# Patient Record
Sex: Male | Born: 1935
Health system: Southern US, Community
[De-identification: ages and names within clinical notes are randomized; demographics above are authoritative.]

## PROBLEM LIST (undated history)

## (undated) DIAGNOSIS — G4730 Sleep apnea, unspecified: Secondary | ICD-10-CM

## (undated) DIAGNOSIS — I10 Essential (primary) hypertension: Secondary | ICD-10-CM

## (undated) DIAGNOSIS — K5792 Diverticulitis of intestine, part unspecified, without perforation or abscess without bleeding: Secondary | ICD-10-CM

## (undated) DIAGNOSIS — I639 Cerebral infarction, unspecified: Secondary | ICD-10-CM

## (undated) DIAGNOSIS — G479 Sleep disorder, unspecified: Secondary | ICD-10-CM

## (undated) DIAGNOSIS — R059 Cough, unspecified: Secondary | ICD-10-CM

## (undated) DIAGNOSIS — F32A Depression, unspecified: Secondary | ICD-10-CM

## (undated) DIAGNOSIS — M1990 Unspecified osteoarthritis, unspecified site: Secondary | ICD-10-CM

## (undated) DIAGNOSIS — K219 Gastro-esophageal reflux disease without esophagitis: Secondary | ICD-10-CM

## (undated) DIAGNOSIS — R05 Cough: Secondary | ICD-10-CM

## (undated) DIAGNOSIS — I499 Cardiac arrhythmia, unspecified: Secondary | ICD-10-CM

## (undated) DIAGNOSIS — F329 Major depressive disorder, single episode, unspecified: Secondary | ICD-10-CM

## (undated) DIAGNOSIS — K649 Unspecified hemorrhoids: Secondary | ICD-10-CM

## (undated) DIAGNOSIS — E7800 Pure hypercholesterolemia, unspecified: Secondary | ICD-10-CM

## (undated) HISTORY — DX: Depression, unspecified: F32.A

## (undated) HISTORY — DX: Unspecified hemorrhoids: K64.9

## (undated) HISTORY — DX: Unspecified osteoarthritis, unspecified site: M19.90

## (undated) HISTORY — DX: Diverticulitis of intestine, part unspecified, without perforation or abscess without bleeding: K57.92

## (undated) HISTORY — DX: Pure hypercholesterolemia, unspecified: E78.00

## (undated) HISTORY — PX: KNEE ARTHROSCOPY: SHX127

## (undated) HISTORY — DX: Cardiac arrhythmia, unspecified: I49.9

## (undated) HISTORY — DX: Sleep disorder, unspecified: G47.9

## (undated) HISTORY — DX: Cerebral infarction, unspecified: I63.9

## (undated) HISTORY — PX: COLONOSCOPY: SHX174

## (undated) HISTORY — PX: TRANSURETHRAL RESECTION OF PROSTATE: SHX73

## (undated) HISTORY — DX: Essential (primary) hypertension: I10

## (undated) HISTORY — DX: Major depressive disorder, single episode, unspecified: F32.9

---

## 2005-11-05 ENCOUNTER — Ambulatory Visit: Payer: Self-pay | Admitting: Gastroenterology

## 2005-11-11 ENCOUNTER — Inpatient Hospital Stay: Payer: Self-pay | Admitting: Gastroenterology

## 2006-09-27 DIAGNOSIS — I10 Essential (primary) hypertension: Secondary | ICD-10-CM | POA: Insufficient documentation

## 2008-01-28 ENCOUNTER — Ambulatory Visit: Payer: Self-pay | Admitting: Specialist

## 2008-02-12 ENCOUNTER — Ambulatory Visit: Payer: Self-pay | Admitting: Specialist

## 2008-02-19 ENCOUNTER — Ambulatory Visit: Payer: Self-pay | Admitting: Specialist

## 2009-04-19 ENCOUNTER — Ambulatory Visit: Payer: Self-pay | Admitting: Family Medicine

## 2011-04-09 DIAGNOSIS — M5137 Other intervertebral disc degeneration, lumbosacral region: Secondary | ICD-10-CM | POA: Diagnosis not present

## 2011-04-09 DIAGNOSIS — R079 Chest pain, unspecified: Secondary | ICD-10-CM | POA: Diagnosis not present

## 2011-04-09 DIAGNOSIS — M549 Dorsalgia, unspecified: Secondary | ICD-10-CM | POA: Diagnosis not present

## 2011-04-25 DIAGNOSIS — J209 Acute bronchitis, unspecified: Secondary | ICD-10-CM | POA: Diagnosis not present

## 2011-06-08 DIAGNOSIS — N529 Male erectile dysfunction, unspecified: Secondary | ICD-10-CM | POA: Diagnosis not present

## 2011-06-08 DIAGNOSIS — E785 Hyperlipidemia, unspecified: Secondary | ICD-10-CM | POA: Diagnosis not present

## 2011-06-08 DIAGNOSIS — I10 Essential (primary) hypertension: Secondary | ICD-10-CM | POA: Diagnosis not present

## 2011-06-08 DIAGNOSIS — F329 Major depressive disorder, single episode, unspecified: Secondary | ICD-10-CM | POA: Diagnosis not present

## 2011-06-13 DIAGNOSIS — F329 Major depressive disorder, single episode, unspecified: Secondary | ICD-10-CM | POA: Diagnosis not present

## 2011-06-13 DIAGNOSIS — E785 Hyperlipidemia, unspecified: Secondary | ICD-10-CM | POA: Diagnosis not present

## 2011-06-13 DIAGNOSIS — I10 Essential (primary) hypertension: Secondary | ICD-10-CM | POA: Diagnosis not present

## 2011-07-06 DIAGNOSIS — J069 Acute upper respiratory infection, unspecified: Secondary | ICD-10-CM | POA: Diagnosis not present

## 2011-08-10 DIAGNOSIS — M1710 Unilateral primary osteoarthritis, unspecified knee: Secondary | ICD-10-CM | POA: Diagnosis not present

## 2011-08-10 DIAGNOSIS — R6882 Decreased libido: Secondary | ICD-10-CM | POA: Diagnosis not present

## 2011-08-10 DIAGNOSIS — I10 Essential (primary) hypertension: Secondary | ICD-10-CM | POA: Diagnosis not present

## 2011-08-10 DIAGNOSIS — H8110 Benign paroxysmal vertigo, unspecified ear: Secondary | ICD-10-CM | POA: Diagnosis not present

## 2011-09-07 DIAGNOSIS — L255 Unspecified contact dermatitis due to plants, except food: Secondary | ICD-10-CM | POA: Diagnosis not present

## 2011-09-07 DIAGNOSIS — R42 Dizziness and giddiness: Secondary | ICD-10-CM | POA: Diagnosis not present

## 2011-09-07 DIAGNOSIS — F329 Major depressive disorder, single episode, unspecified: Secondary | ICD-10-CM | POA: Diagnosis not present

## 2011-09-07 DIAGNOSIS — I10 Essential (primary) hypertension: Secondary | ICD-10-CM | POA: Diagnosis not present

## 2011-09-13 DIAGNOSIS — M999 Biomechanical lesion, unspecified: Secondary | ICD-10-CM | POA: Diagnosis not present

## 2011-09-13 DIAGNOSIS — M5380 Other specified dorsopathies, site unspecified: Secondary | ICD-10-CM | POA: Diagnosis not present

## 2011-09-13 DIAGNOSIS — M5430 Sciatica, unspecified side: Secondary | ICD-10-CM | POA: Diagnosis not present

## 2011-09-18 DIAGNOSIS — M999 Biomechanical lesion, unspecified: Secondary | ICD-10-CM | POA: Diagnosis not present

## 2011-09-18 DIAGNOSIS — M5430 Sciatica, unspecified side: Secondary | ICD-10-CM | POA: Diagnosis not present

## 2011-09-18 DIAGNOSIS — M5380 Other specified dorsopathies, site unspecified: Secondary | ICD-10-CM | POA: Diagnosis not present

## 2011-09-24 DIAGNOSIS — M5380 Other specified dorsopathies, site unspecified: Secondary | ICD-10-CM | POA: Diagnosis not present

## 2011-09-24 DIAGNOSIS — M5430 Sciatica, unspecified side: Secondary | ICD-10-CM | POA: Diagnosis not present

## 2011-09-24 DIAGNOSIS — M999 Biomechanical lesion, unspecified: Secondary | ICD-10-CM | POA: Diagnosis not present

## 2011-09-27 DIAGNOSIS — M5430 Sciatica, unspecified side: Secondary | ICD-10-CM | POA: Diagnosis not present

## 2011-09-27 DIAGNOSIS — M5380 Other specified dorsopathies, site unspecified: Secondary | ICD-10-CM | POA: Diagnosis not present

## 2011-09-27 DIAGNOSIS — M999 Biomechanical lesion, unspecified: Secondary | ICD-10-CM | POA: Diagnosis not present

## 2011-10-12 DIAGNOSIS — M999 Biomechanical lesion, unspecified: Secondary | ICD-10-CM | POA: Diagnosis not present

## 2011-10-12 DIAGNOSIS — M5380 Other specified dorsopathies, site unspecified: Secondary | ICD-10-CM | POA: Diagnosis not present

## 2011-10-12 DIAGNOSIS — M5430 Sciatica, unspecified side: Secondary | ICD-10-CM | POA: Diagnosis not present

## 2011-10-26 DIAGNOSIS — M5430 Sciatica, unspecified side: Secondary | ICD-10-CM | POA: Diagnosis not present

## 2011-10-26 DIAGNOSIS — M999 Biomechanical lesion, unspecified: Secondary | ICD-10-CM | POA: Diagnosis not present

## 2011-10-26 DIAGNOSIS — M5380 Other specified dorsopathies, site unspecified: Secondary | ICD-10-CM | POA: Diagnosis not present

## 2011-11-12 DIAGNOSIS — M5430 Sciatica, unspecified side: Secondary | ICD-10-CM | POA: Diagnosis not present

## 2011-11-12 DIAGNOSIS — M999 Biomechanical lesion, unspecified: Secondary | ICD-10-CM | POA: Diagnosis not present

## 2011-11-12 DIAGNOSIS — M5380 Other specified dorsopathies, site unspecified: Secondary | ICD-10-CM | POA: Diagnosis not present

## 2011-12-13 DIAGNOSIS — H2510 Age-related nuclear cataract, unspecified eye: Secondary | ICD-10-CM | POA: Diagnosis not present

## 2011-12-31 DIAGNOSIS — Z23 Encounter for immunization: Secondary | ICD-10-CM | POA: Diagnosis not present

## 2012-01-09 DIAGNOSIS — N529 Male erectile dysfunction, unspecified: Secondary | ICD-10-CM | POA: Diagnosis not present

## 2012-01-09 DIAGNOSIS — E7800 Pure hypercholesterolemia, unspecified: Secondary | ICD-10-CM | POA: Diagnosis not present

## 2012-01-09 DIAGNOSIS — R42 Dizziness and giddiness: Secondary | ICD-10-CM | POA: Diagnosis not present

## 2012-01-09 DIAGNOSIS — I10 Essential (primary) hypertension: Secondary | ICD-10-CM | POA: Diagnosis not present

## 2012-01-10 DIAGNOSIS — M5430 Sciatica, unspecified side: Secondary | ICD-10-CM | POA: Diagnosis not present

## 2012-01-10 DIAGNOSIS — M999 Biomechanical lesion, unspecified: Secondary | ICD-10-CM | POA: Diagnosis not present

## 2012-01-10 DIAGNOSIS — M5380 Other specified dorsopathies, site unspecified: Secondary | ICD-10-CM | POA: Diagnosis not present

## 2012-01-11 DIAGNOSIS — I10 Essential (primary) hypertension: Secondary | ICD-10-CM | POA: Diagnosis not present

## 2012-01-11 DIAGNOSIS — E7800 Pure hypercholesterolemia, unspecified: Secondary | ICD-10-CM | POA: Diagnosis not present

## 2012-05-28 DIAGNOSIS — I10 Essential (primary) hypertension: Secondary | ICD-10-CM | POA: Diagnosis not present

## 2012-05-28 DIAGNOSIS — E7800 Pure hypercholesterolemia, unspecified: Secondary | ICD-10-CM | POA: Diagnosis not present

## 2012-06-27 DIAGNOSIS — I10 Essential (primary) hypertension: Secondary | ICD-10-CM | POA: Diagnosis not present

## 2012-06-27 DIAGNOSIS — R42 Dizziness and giddiness: Secondary | ICD-10-CM | POA: Diagnosis not present

## 2012-06-27 DIAGNOSIS — E7800 Pure hypercholesterolemia, unspecified: Secondary | ICD-10-CM | POA: Diagnosis not present

## 2012-06-30 DIAGNOSIS — I10 Essential (primary) hypertension: Secondary | ICD-10-CM | POA: Diagnosis not present

## 2012-06-30 DIAGNOSIS — E7800 Pure hypercholesterolemia, unspecified: Secondary | ICD-10-CM | POA: Diagnosis not present

## 2012-07-07 DIAGNOSIS — J209 Acute bronchitis, unspecified: Secondary | ICD-10-CM | POA: Diagnosis not present

## 2012-09-09 DIAGNOSIS — R42 Dizziness and giddiness: Secondary | ICD-10-CM | POA: Diagnosis not present

## 2012-10-31 DIAGNOSIS — R1013 Epigastric pain: Secondary | ICD-10-CM | POA: Diagnosis not present

## 2012-11-04 DIAGNOSIS — R5383 Other fatigue: Secondary | ICD-10-CM | POA: Diagnosis not present

## 2012-11-04 DIAGNOSIS — I10 Essential (primary) hypertension: Secondary | ICD-10-CM | POA: Diagnosis not present

## 2012-11-04 DIAGNOSIS — H6120 Impacted cerumen, unspecified ear: Secondary | ICD-10-CM | POA: Diagnosis not present

## 2012-11-04 DIAGNOSIS — E7800 Pure hypercholesterolemia, unspecified: Secondary | ICD-10-CM | POA: Diagnosis not present

## 2012-11-05 DIAGNOSIS — E7800 Pure hypercholesterolemia, unspecified: Secondary | ICD-10-CM | POA: Diagnosis not present

## 2012-11-05 DIAGNOSIS — R5381 Other malaise: Secondary | ICD-10-CM | POA: Diagnosis not present

## 2012-11-13 DIAGNOSIS — Z1331 Encounter for screening for depression: Secondary | ICD-10-CM | POA: Diagnosis not present

## 2012-11-13 DIAGNOSIS — Z0000 Encounter for general adult medical examination without abnormal findings: Secondary | ICD-10-CM | POA: Diagnosis not present

## 2012-11-13 DIAGNOSIS — Z1211 Encounter for screening for malignant neoplasm of colon: Secondary | ICD-10-CM | POA: Diagnosis not present

## 2012-11-13 DIAGNOSIS — Z125 Encounter for screening for malignant neoplasm of prostate: Secondary | ICD-10-CM | POA: Diagnosis not present

## 2013-03-17 DIAGNOSIS — M955 Acquired deformity of pelvis: Secondary | ICD-10-CM | POA: Diagnosis not present

## 2013-03-17 DIAGNOSIS — M999 Biomechanical lesion, unspecified: Secondary | ICD-10-CM | POA: Diagnosis not present

## 2013-03-17 DIAGNOSIS — IMO0002 Reserved for concepts with insufficient information to code with codable children: Secondary | ICD-10-CM | POA: Diagnosis not present

## 2013-03-18 DIAGNOSIS — F329 Major depressive disorder, single episode, unspecified: Secondary | ICD-10-CM | POA: Diagnosis not present

## 2013-03-18 DIAGNOSIS — I10 Essential (primary) hypertension: Secondary | ICD-10-CM | POA: Diagnosis not present

## 2013-03-18 DIAGNOSIS — N529 Male erectile dysfunction, unspecified: Secondary | ICD-10-CM | POA: Diagnosis not present

## 2013-03-18 DIAGNOSIS — E7800 Pure hypercholesterolemia, unspecified: Secondary | ICD-10-CM | POA: Diagnosis not present

## 2013-03-30 DIAGNOSIS — IMO0002 Reserved for concepts with insufficient information to code with codable children: Secondary | ICD-10-CM | POA: Diagnosis not present

## 2013-03-30 DIAGNOSIS — M955 Acquired deformity of pelvis: Secondary | ICD-10-CM | POA: Diagnosis not present

## 2013-03-30 DIAGNOSIS — M999 Biomechanical lesion, unspecified: Secondary | ICD-10-CM | POA: Diagnosis not present

## 2013-04-03 DIAGNOSIS — J00 Acute nasopharyngitis [common cold]: Secondary | ICD-10-CM | POA: Diagnosis not present

## 2013-06-23 DIAGNOSIS — H2510 Age-related nuclear cataract, unspecified eye: Secondary | ICD-10-CM | POA: Diagnosis not present

## 2013-07-22 DIAGNOSIS — E8881 Metabolic syndrome: Secondary | ICD-10-CM | POA: Diagnosis not present

## 2013-07-22 DIAGNOSIS — E7800 Pure hypercholesterolemia, unspecified: Secondary | ICD-10-CM | POA: Diagnosis not present

## 2013-07-22 DIAGNOSIS — I10 Essential (primary) hypertension: Secondary | ICD-10-CM | POA: Diagnosis not present

## 2013-07-22 DIAGNOSIS — R7309 Other abnormal glucose: Secondary | ICD-10-CM | POA: Diagnosis not present

## 2013-08-12 DIAGNOSIS — E7800 Pure hypercholesterolemia, unspecified: Secondary | ICD-10-CM | POA: Diagnosis not present

## 2013-08-12 DIAGNOSIS — I10 Essential (primary) hypertension: Secondary | ICD-10-CM | POA: Diagnosis not present

## 2013-10-29 DIAGNOSIS — H2510 Age-related nuclear cataract, unspecified eye: Secondary | ICD-10-CM | POA: Diagnosis not present

## 2013-12-09 DIAGNOSIS — F3289 Other specified depressive episodes: Secondary | ICD-10-CM | POA: Diagnosis not present

## 2013-12-09 DIAGNOSIS — B351 Tinea unguium: Secondary | ICD-10-CM | POA: Diagnosis not present

## 2013-12-09 DIAGNOSIS — I10 Essential (primary) hypertension: Secondary | ICD-10-CM | POA: Diagnosis not present

## 2013-12-09 DIAGNOSIS — F329 Major depressive disorder, single episode, unspecified: Secondary | ICD-10-CM | POA: Diagnosis not present

## 2013-12-09 DIAGNOSIS — Z23 Encounter for immunization: Secondary | ICD-10-CM | POA: Diagnosis not present

## 2014-01-25 DIAGNOSIS — Z1389 Encounter for screening for other disorder: Secondary | ICD-10-CM | POA: Diagnosis not present

## 2014-01-25 DIAGNOSIS — Z1211 Encounter for screening for malignant neoplasm of colon: Secondary | ICD-10-CM | POA: Diagnosis not present

## 2014-01-25 DIAGNOSIS — Z23 Encounter for immunization: Secondary | ICD-10-CM | POA: Diagnosis not present

## 2014-01-25 DIAGNOSIS — Z9181 History of falling: Secondary | ICD-10-CM | POA: Diagnosis not present

## 2014-01-25 DIAGNOSIS — Z125 Encounter for screening for malignant neoplasm of prostate: Secondary | ICD-10-CM | POA: Diagnosis not present

## 2014-01-25 DIAGNOSIS — Z0000 Encounter for general adult medical examination without abnormal findings: Secondary | ICD-10-CM | POA: Diagnosis not present

## 2014-02-23 DIAGNOSIS — M9905 Segmental and somatic dysfunction of pelvic region: Secondary | ICD-10-CM | POA: Diagnosis not present

## 2014-02-23 DIAGNOSIS — M5136 Other intervertebral disc degeneration, lumbar region: Secondary | ICD-10-CM | POA: Diagnosis not present

## 2014-02-23 DIAGNOSIS — M955 Acquired deformity of pelvis: Secondary | ICD-10-CM | POA: Diagnosis not present

## 2014-02-23 DIAGNOSIS — M9903 Segmental and somatic dysfunction of lumbar region: Secondary | ICD-10-CM | POA: Diagnosis not present

## 2014-03-17 DIAGNOSIS — F329 Major depressive disorder, single episode, unspecified: Secondary | ICD-10-CM | POA: Diagnosis not present

## 2014-03-17 DIAGNOSIS — R001 Bradycardia, unspecified: Secondary | ICD-10-CM | POA: Diagnosis not present

## 2014-03-17 DIAGNOSIS — K5900 Constipation, unspecified: Secondary | ICD-10-CM | POA: Diagnosis not present

## 2014-03-17 DIAGNOSIS — I10 Essential (primary) hypertension: Secondary | ICD-10-CM | POA: Diagnosis not present

## 2014-03-17 DIAGNOSIS — E780 Pure hypercholesterolemia: Secondary | ICD-10-CM | POA: Diagnosis not present

## 2014-03-17 DIAGNOSIS — Z0000 Encounter for general adult medical examination without abnormal findings: Secondary | ICD-10-CM | POA: Diagnosis not present

## 2014-03-17 DIAGNOSIS — G479 Sleep disorder, unspecified: Secondary | ICD-10-CM | POA: Diagnosis not present

## 2014-03-17 LAB — BASIC METABOLIC PANEL: Glucose: 91 mg/dL

## 2014-04-05 ENCOUNTER — Encounter (INDEPENDENT_AMBULATORY_CARE_PROVIDER_SITE_OTHER): Payer: Self-pay

## 2014-04-05 ENCOUNTER — Encounter: Payer: Self-pay | Admitting: *Deleted

## 2014-04-05 ENCOUNTER — Ambulatory Visit (INDEPENDENT_AMBULATORY_CARE_PROVIDER_SITE_OTHER): Payer: Commercial Managed Care - HMO | Admitting: Cardiovascular Disease

## 2014-04-05 ENCOUNTER — Encounter: Payer: Self-pay | Admitting: Cardiovascular Disease

## 2014-04-05 VITALS — BP 126/76 | HR 81 | Ht 71.0 in | Wt 255.2 lb

## 2014-04-05 DIAGNOSIS — R001 Bradycardia, unspecified: Secondary | ICD-10-CM | POA: Diagnosis not present

## 2014-04-05 DIAGNOSIS — I10 Essential (primary) hypertension: Secondary | ICD-10-CM | POA: Diagnosis not present

## 2014-04-05 NOTE — Patient Instructions (Signed)
Your physician has recommended that you wear a holter monitor. Holter monitors are medical devices that record the heart's electrical activity. Doctors most often use these monitors to diagnose arrhythmias. Arrhythmias are problems with the speed or rhythm of the heartbeat. The monitor is a small, portable device. You can wear one while you do your normal daily activities. This is usually used to diagnose what is causing palpitations/syncope (passing out).  Your physician wants you to follow-up in: 6 months. You will receive a reminder letter in the mail two months in advance. If you don't receive a letter, please call our office to schedule the follow-up appointment.  

## 2014-04-05 NOTE — Progress Notes (Signed)
Primary care physician: Dr. Thana Ates  HPI  This is a pleasant 81 year old male who was referred for evaluation of bradycardia and PVCs. He has no previous cardiac history. He has known history of hypertension, GERD, hyperlipidemia and obesity. He reports recent symptoms of vertigo which improved without intervention. He currently denies dizziness, presyncope or syncope. He was noted recently to be bradycardic with a heart rate of 51 bpm with frequent PVCs noted on EKG. He had labs done which were unremarkable including normal thyroid function. He denies chest pain or shortness of breath. He is very active and plays golf twice a week. He also does his own yard work without significant limitations. He is not a smoker and denies any alcohol use. There is no family history of premature coronary artery disease or arrhythmia.  No Known Allergies   No current outpatient prescriptions on file prior to visit.   No current facility-administered medications on file prior to visit.     Past Medical History  Diagnosis Date  . Diverticulitis   . Hypertension   . Pure hypercholesterolemia   . Hemorrhoids   . OA (osteoarthritis)   . Disturbance of sleep      Past Surgical History  Procedure Laterality Date  . Transurethral resection of prostate    . Knee arthroscopy    . Colonoscopy       Family History  Problem Relation Age of Onset  . Family history unknown: Yes     History   Social History  . Marital Status: Divorced    Spouse Name: N/A    Number of Children: N/A  . Years of Education: N/A   Occupational History  . Not on file.   Social History Main Topics  . Smoking status: Never Smoker   . Smokeless tobacco: Not on file  . Alcohol Use: No  . Drug Use: No  . Sexual Activity: Not on file   Other Topics Concern  . Not on file   Social History Narrative     ROS A 10 point review of system was performed. It is negative other than that mentioned in the history of  present illness.   PHYSICAL EXAM   BP 126/76 mmHg  Pulse 81  Ht  (1.803 m)  Wt 255 lb 4 oz (115.781 kg)  BMI 35.62 kg/m2 Constitutional: He is oriented to person, place, and time. He appears well-developed and well-nourished. No distress.  HENT: No nasal discharge.  Head: Normocephalic and atraumatic.  Eyes: Pupils are equal and round.  No discharge. Neck: Normal range of motion. Neck supple. No JVD present. No thyromegaly present.  Cardiovascular: Normal rate, regular rhythm, normal heart sounds. Exam reveals no gallop and no friction rub. No murmur heard.  Pulmonary/Chest: Effort normal and breath sounds normal. No stridor. No respiratory distress. He has no wheezes. He has no rales. He exhibits no tenderness.  Abdominal: Soft. Bowel sounds are normal. He exhibits no distension. There is no tenderness. There is no rebound and no guarding.  Musculoskeletal: Normal range of motion. He exhibits no edema and no tenderness.  Neurological: He is alert and oriented to person, place, and time. Coordination normal.  Skin: Skin is warm and dry. No rash noted. He is not diaphoretic. No erythema. No pallor.  Psychiatric: He has a normal mood and affect. His behavior is normal. Judgment and thought content normal.       ZOX:[id number]  Rhythm  -Left axis -anterior fascicular block.   ABNORMAL  ASSESSMENT AND PLAN

## 2014-04-05 NOTE — Assessment & Plan Note (Signed)
Blood pressure is well controlled on current medications. 

## 2014-04-05 NOTE — Assessment & Plan Note (Signed)
The patient had recent bradycardia with a heart rate of 51 bpm while awake. He reports that he had vertigo and not dizziness, syncope or presyncope. It is difficult to determine if he was symptomatic from a cardiac standpoint. I recommend a 48 hour Holter monitor for evaluation. He was also noted to have PVCs but current EKG seems unremarkable other than left anterior fascicular block. He has no symptoms suggestive of angina and he seems to be very active.

## 2014-04-15 DIAGNOSIS — R001 Bradycardia, unspecified: Secondary | ICD-10-CM

## 2014-05-05 ENCOUNTER — Telehealth: Payer: Self-pay | Admitting: *Deleted

## 2014-05-05 ENCOUNTER — Ambulatory Visit (INDEPENDENT_AMBULATORY_CARE_PROVIDER_SITE_OTHER): Payer: Medicare PPO

## 2014-05-05 ENCOUNTER — Other Ambulatory Visit: Payer: Self-pay

## 2014-05-05 DIAGNOSIS — R001 Bradycardia, unspecified: Secondary | ICD-10-CM

## 2014-05-05 DIAGNOSIS — I493 Ventricular premature depolarization: Secondary | ICD-10-CM

## 2014-05-05 NOTE — Telephone Encounter (Signed)
Informed patient per Dr. Kirke CorinArida his holter showed NSR with very frequent PVC's  Schedule echo and f/u   Patient verbalized understanding

## 2014-05-14 ENCOUNTER — Other Ambulatory Visit: Payer: Medicare PPO

## 2014-05-18 ENCOUNTER — Other Ambulatory Visit: Payer: Self-pay

## 2014-05-18 ENCOUNTER — Other Ambulatory Visit (INDEPENDENT_AMBULATORY_CARE_PROVIDER_SITE_OTHER): Payer: Commercial Managed Care - HMO

## 2014-05-18 DIAGNOSIS — R9431 Abnormal electrocardiogram [ECG] [EKG]: Secondary | ICD-10-CM

## 2014-05-18 DIAGNOSIS — R001 Bradycardia, unspecified: Secondary | ICD-10-CM

## 2014-05-18 DIAGNOSIS — I493 Ventricular premature depolarization: Secondary | ICD-10-CM

## 2014-05-24 ENCOUNTER — Encounter: Payer: Self-pay | Admitting: Cardiovascular Disease

## 2014-05-24 ENCOUNTER — Ambulatory Visit (INDEPENDENT_AMBULATORY_CARE_PROVIDER_SITE_OTHER): Payer: Commercial Managed Care - HMO | Admitting: Cardiovascular Disease

## 2014-05-24 VITALS — BP 148/80 | HR 70 | Ht 73.0 in | Wt 254.2 lb

## 2014-05-24 DIAGNOSIS — I493 Ventricular premature depolarization: Secondary | ICD-10-CM

## 2014-05-24 DIAGNOSIS — I10 Essential (primary) hypertension: Secondary | ICD-10-CM

## 2014-05-24 MED ORDER — DILTIAZEM HCL ER COATED BEADS 120 MG PO CP24: 120.0000 mg | ORAL_CAPSULE | Freq: Every day | ORAL | Status: DC

## 2014-05-24 NOTE — Patient Instructions (Signed)
Your physician has recommended you make the following change in your medication:  Stop Lisinopril  Start Diltiazem ER 120 mg once daily   Your physician wants you to follow-up in: 6 months. You will receive a reminder letter in the mail two months in advance. If you don't receive a letter, please call our office to schedule the follow-up appointment.

## 2014-05-24 NOTE — Progress Notes (Signed)
Primary care physician: Dr. Thana AtesMorrisey  HPI  This is a pleasant 81 year old male who is here today for a follow-up visit regarding  bradycardia and PVCs. He has no previous cardiac history. He has known history of hypertension, GERD, hyperlipidemia and obesity. He reports recent symptoms of vertigo which improved without intervention. He currently denies dizziness, presyncope or syncope. He was noted recently to be bradycardic with a heart rate of 51 bpm with frequent PVCs noted on EKG. He had labs done which were unremarkable including normal thyroid function. He denies chest pain or shortness of breath. He is very active and plays golf twice a week. He also does his own yard work without significant limitations. Holter monitor showed sinus rhythm with very frequent PVCs. He had a total of 29,000 beats constituting 18%. Echocardiogram showed normal LV systolic function with mildly dilated aortic root and mild mitral regurgitation due to prolapse of the posterior leaflet.  No Known Allergies   Current Outpatient Prescriptions on File Prior to Visit  Medication Sig Dispense Refill  . aspirin 81 MG tablet Take 81 mg by mouth daily.    . citalopram (CELEXA) 10 MG tablet Take 10 mg by mouth daily.    . cyclobenzaprine (FLEXERIL) 5 MG tablet Take 5 mg by mouth at bedtime.    . diazepam (VALIUM) 5 MG tablet Take 5 mg by mouth every 8 (eight) hours as needed for anxiety.    . lansoprazole (PREVACID) 30 MG capsule Take 30 mg by mouth daily at 12 noon.    [name]. lisinopril (PRINIVIL,ZESTRIL) 10 MG tablet Take 10 mg by mouth daily.    . meloxicam (MOBIC) 7.5 MG tablet Take 7.5 mg by mouth 2 (two) times daily.    . Omega 3 1200 MG CAPS Take by mouth daily.    . sildenafil (VIAGRA) 100 MG tablet Take 100 mg by mouth daily as needed for erectile dysfunction.    [name]. terazosin (HYTRIN) 5 MG capsule Take 5 mg by mouth at bedtime.    . vitamin B-12 (CYANOCOBALAMIN) 1000 MCG tablet Take 2,000 mcg by mouth daily.       No current facility-administered medications on file prior to visit.     Past Medical History  Diagnosis Date  . Diverticulitis   . Hypertension   . Pure hypercholesterolemia   . Hemorrhoids   . OA (osteoarthritis)   . Disturbance of sleep      Past Surgical History  Procedure Laterality Date  . Transurethral resection of prostate    . Knee arthroscopy    . Colonoscopy       Family History  Problem Relation Age of Onset  . Family history unknown: Yes     History   Social History  . Marital Status: Divorced    Spouse Name: N/A  . Number of Children: N/A  . Years of Education: N/A   Occupational History  . Not on file.   Social History Main Topics  . Smoking status: Never Smoker   . Smokeless tobacco: Not on file  . Alcohol Use: No  . Drug Use: No  . Sexual Activity: Not on file   Other Topics Concern  . Not on file   Social History Narrative     ROS A 10 point review of system was performed. It is negative other than that mentioned in the history of present illness.   PHYSICAL EXAM   BP 148/80 mmHg  Pulse 70  Ht 6\' 1"  (1.854 m)  Wt 254  lb 4 oz (115.327 kg)  BMI 33.55 kg/m2 Constitutional: He is oriented to person, place, and time. He appears well-developed and well-nourished. No distress.  HENT: No nasal discharge.  Head: Normocephalic and atraumatic.  Eyes: Pupils are equal and round.  No discharge. Neck: Normal range of motion. Neck supple. No JVD present. No thyromegaly present.  Cardiovascular: Normal rate, regular rhythm, normal heart sounds. Exam reveals no gallop and no friction rub. No murmur heard.  Pulmonary/Chest: Effort normal and breath sounds normal. No stridor. No respiratory distress. He has no wheezes. He has no rales. He exhibits no tenderness.  Abdominal: Soft. Bowel sounds are normal. He exhibits no distension. There is no tenderness. There is no rebound and no guarding.  Musculoskeletal: Normal range of motion. He  exhibits no edema and no tenderness.  Neurological: He is alert and oriented to person, place, and time. Coordination normal.  Skin: Skin is warm and dry. No rash noted. He is not diaphoretic. No erythema. No pallor.  Psychiatric: He has a normal mood and affect. His behavior is normal. Judgment and thought content normal.       WUJ:[id number]  Rhythm  -Left axis -anterior fascicular block.   ABNORMAL    ASSESSMENT AND PLAN

## 2014-05-27 DIAGNOSIS — I493 Ventricular premature depolarization: Secondary | ICD-10-CM | POA: Insufficient documentation

## 2014-05-27 NOTE — Assessment & Plan Note (Signed)
The patient has frequent PVCs. Although he is asymptomatic, the burden of PVCs is significant enough to warrant treatment. LV systolic function was normal by echo. I started treatment with diltiazem extended release 120 mg once daily.

## 2014-05-27 NOTE — Assessment & Plan Note (Signed)
I discontinued lisinopril given that I am starting diltiazem.

## 2014-08-19 ENCOUNTER — Emergency Department (HOSPITAL_COMMUNITY): Payer: Commercial Managed Care - HMO

## 2014-08-19 ENCOUNTER — Inpatient Hospital Stay (HOSPITAL_COMMUNITY): Payer: Commercial Managed Care - HMO

## 2014-08-19 ENCOUNTER — Encounter (HOSPITAL_COMMUNITY): Payer: Self-pay

## 2014-08-19 ENCOUNTER — Inpatient Hospital Stay (HOSPITAL_COMMUNITY)
Admission: EM | Admit: 2014-08-19 | Discharge: 2014-08-23 | DRG: 065 | Payer: Commercial Managed Care - HMO | Attending: Internal Medicine | Admitting: Internal Medicine

## 2014-08-19 DIAGNOSIS — I639 Cerebral infarction, unspecified: Secondary | ICD-10-CM | POA: Diagnosis present

## 2014-08-19 DIAGNOSIS — I63031 Cerebral infarction due to thrombosis of right carotid artery: Secondary | ICD-10-CM | POA: Diagnosis not present

## 2014-08-19 DIAGNOSIS — N179 Acute kidney failure, unspecified: Secondary | ICD-10-CM | POA: Diagnosis not present

## 2014-08-19 DIAGNOSIS — Z7982 Long term (current) use of aspirin: Secondary | ICD-10-CM | POA: Diagnosis not present

## 2014-08-19 DIAGNOSIS — I129 Hypertensive chronic kidney disease with stage 1 through stage 4 chronic kidney disease, or unspecified chronic kidney disease: Secondary | ICD-10-CM | POA: Diagnosis present

## 2014-08-19 DIAGNOSIS — E780 Pure hypercholesterolemia: Secondary | ICD-10-CM | POA: Diagnosis present

## 2014-08-19 DIAGNOSIS — I6789 Other cerebrovascular disease: Secondary | ICD-10-CM | POA: Diagnosis not present

## 2014-08-19 DIAGNOSIS — N183 Chronic kidney disease, stage 3 unspecified: Secondary | ICD-10-CM | POA: Diagnosis present

## 2014-08-19 DIAGNOSIS — Y9301 Activity, walking, marching and hiking: Secondary | ICD-10-CM

## 2014-08-19 DIAGNOSIS — E6609 Other obesity due to excess calories: Secondary | ICD-10-CM | POA: Insufficient documentation

## 2014-08-19 DIAGNOSIS — G464 Cerebellar stroke syndrome: Secondary | ICD-10-CM

## 2014-08-19 DIAGNOSIS — M1990 Unspecified osteoarthritis, unspecified site: Secondary | ICD-10-CM | POA: Diagnosis present

## 2014-08-19 DIAGNOSIS — E785 Hyperlipidemia, unspecified: Secondary | ICD-10-CM | POA: Diagnosis present

## 2014-08-19 DIAGNOSIS — I10 Essential (primary) hypertension: Secondary | ICD-10-CM | POA: Diagnosis not present

## 2014-08-19 DIAGNOSIS — R4781 Slurred speech: Secondary | ICD-10-CM | POA: Diagnosis present

## 2014-08-19 DIAGNOSIS — W1830XA Fall on same level, unspecified, initial encounter: Secondary | ICD-10-CM | POA: Diagnosis present

## 2014-08-19 DIAGNOSIS — I739 Peripheral vascular disease, unspecified: Secondary | ICD-10-CM | POA: Diagnosis present

## 2014-08-19 DIAGNOSIS — G8194 Hemiplegia, unspecified affecting left nondominant side: Secondary | ICD-10-CM | POA: Diagnosis present

## 2014-08-19 DIAGNOSIS — Z6833 Body mass index (BMI) 33.0-33.9, adult: Secondary | ICD-10-CM | POA: Diagnosis not present

## 2014-08-19 DIAGNOSIS — K219 Gastro-esophageal reflux disease without esophagitis: Secondary | ICD-10-CM | POA: Diagnosis not present

## 2014-08-19 DIAGNOSIS — R531 Weakness: Secondary | ICD-10-CM | POA: Diagnosis not present

## 2014-08-19 DIAGNOSIS — G8190 Hemiplegia, unspecified affecting unspecified side: Secondary | ICD-10-CM | POA: Diagnosis not present

## 2014-08-19 DIAGNOSIS — E669 Obesity, unspecified: Secondary | ICD-10-CM | POA: Diagnosis present

## 2014-08-19 DIAGNOSIS — I69354 Hemiplegia and hemiparesis following cerebral infarction affecting left non-dominant side: Secondary | ICD-10-CM | POA: Diagnosis not present

## 2014-08-19 DIAGNOSIS — Z6830 Body mass index (BMI) 30.0-30.9, adult: Secondary | ICD-10-CM | POA: Insufficient documentation

## 2014-08-19 LAB — CBC
HCT: 47.6 % (ref 39.0–52.0)
Hemoglobin: 16.2 g/dL (ref 13.0–17.0)
MCH: 30.7 pg (ref 26.0–34.0)
MCHC: 34 g/dL (ref 30.0–36.0)
MCV: 90.2 fL (ref 78.0–100.0)
PLATELETS: 171 10*3/uL (ref 150–400)
RBC: 5.28 MIL/uL (ref 4.22–5.81)
RDW: 12.5 % (ref 11.5–15.5)
WBC: 6.1 10*3/uL (ref 4.0–10.5)

## 2014-08-19 LAB — COMPREHENSIVE METABOLIC PANEL
ALT: 17 U/L (ref 17–63)
ALT: 17 U/L (ref 17–63)
ANION GAP: 9 (ref 5–15)
AST: 20 U/L (ref 15–41)
AST: 18 U/L (ref 15–41)
Albumin: 3.7 g/dL (ref 3.5–5.0)
Albumin: 3.5 g/dL (ref 3.5–5.0)
Alkaline Phosphatase: 51 U/L (ref 38–126)
Alkaline Phosphatase: 47 U/L (ref 38–126)
Anion gap: 10 (ref 5–15)
BILIRUBIN TOTAL: 1.3 mg/dL — AB (ref 0.3–1.2)
BUN: 16 mg/dL (ref 6–20)
BUN: 13 mg/dL (ref 6–20)
CHLORIDE: 108 mmol/L (ref 101–111)
CO2: 21 mmol/L — AB (ref 22–32)
CO2: 21 mmol/L — ABNORMAL LOW (ref 22–32)
Calcium: 8.5 mg/dL — ABNORMAL LOW (ref 8.9–10.3)
Calcium: 8.6 mg/dL — ABNORMAL LOW (ref 8.9–10.3)
Chloride: 108 mmol/L (ref 101–111)
Creatinine, Ser: 1.3 mg/dL — ABNORMAL HIGH (ref 0.61–1.24)
Creatinine, Ser: 1.05 mg/dL (ref 0.61–1.24)
GFR calc Af Amer: 57 mL/min — ABNORMAL LOW (ref >60)
GFR calc Af Amer: >60 mL/min (ref >60)
GFR calc non Af Amer: >60 mL/min (ref >60)
GFR, EST NON AFRICAN AMERICAN: 49 mL/min — AB (ref >60)
Glucose, Bld: 136 mg/dL — ABNORMAL HIGH (ref 65–99)
Glucose, Bld: 127 mg/dL — ABNORMAL HIGH (ref 65–99)
POTASSIUM: 3.5 mmol/L (ref 3.5–5.1)
POTASSIUM: 3.8 mmol/L (ref 3.5–5.1)
SODIUM: 139 mmol/L (ref 135–145)
SODIUM: 138 mmol/L (ref 135–145)
TOTAL PROTEIN: 6.3 g/dL — AB (ref 6.5–8.1)
TOTAL PROTEIN: 6.1 g/dL — AB (ref 6.5–8.1)
Total Bilirubin: 1 mg/dL (ref 0.3–1.2)

## 2014-08-19 LAB — PROTIME-INR
INR: 1.05 (ref 0.00–1.49)
Prothrombin Time: 13.8 seconds (ref 11.6–15.2)

## 2014-08-19 LAB — URINALYSIS, ROUTINE W REFLEX MICROSCOPIC
Bilirubin Urine: NEGATIVE
Glucose, UA: NEGATIVE mg/dL
Ketones, ur: NEGATIVE mg/dL
LEUKOCYTES UA: NEGATIVE
NITRITE: NEGATIVE
Protein, ur: 100 mg/dL — AB
Specific Gravity, Urine: 1.015 (ref 1.005–1.030)
Urobilinogen, UA: 1 mg/dL (ref 0.0–1.0)
pH: 7 (ref 5.0–8.0)

## 2014-08-19 LAB — RAPID URINE DRUG SCREEN, HOSP PERFORMED
Amphetamines: NOT DETECTED
Barbiturates: NOT DETECTED
Benzodiazepines: NOT DETECTED
Cocaine: NOT DETECTED
OPIATES: NOT DETECTED
Tetrahydrocannabinol: NOT DETECTED

## 2014-08-19 LAB — CBC WITH DIFFERENTIAL/PLATELET
Basophils Absolute: 0 10*3/uL (ref 0.0–0.1)
Basophils Relative: 0 % (ref 0–1)
EOS ABS: 0 10*3/uL (ref 0.0–0.7)
Eosinophils Relative: 0 % (ref 0–5)
HEMATOCRIT: 48.1 % (ref 39.0–52.0)
HEMOGLOBIN: 16.8 g/dL (ref 13.0–17.0)
LYMPHS ABS: 0.9 10*3/uL (ref 0.7–4.0)
Lymphocytes Relative: 10 % — ABNORMAL LOW (ref 12–46)
MCH: 31.1 pg (ref 26.0–34.0)
MCHC: 34.9 g/dL (ref 30.0–36.0)
MCV: 88.9 fL (ref 78.0–100.0)
MONOS PCT: 6 % (ref 3–12)
Monocytes Absolute: 0.6 10*3/uL (ref 0.1–1.0)
Neutro Abs: 7.5 10*3/uL (ref 1.7–7.7)
Neutrophils Relative %: 84 % — ABNORMAL HIGH (ref 43–77)
Platelets: 180 10*3/uL (ref 150–400)
RBC: 5.41 MIL/uL (ref 4.22–5.81)
RDW: 12.5 % (ref 11.5–15.5)
WBC: 9.1 10*3/uL (ref 4.0–10.5)

## 2014-08-19 LAB — LIPID PANEL
CHOL/HDL RATIO: 2.7 ratio
Cholesterol: 130 mg/dL (ref 0–200)
HDL: 48 mg/dL (ref >40)
LDL CALC: 74 mg/dL (ref 0–99)
TRIGLYCERIDES: 40 mg/dL (ref <150)
VLDL: 8 mg/dL (ref 0–40)

## 2014-08-19 LAB — TSH: TSH: 2.052 u[IU]/mL (ref 0.350–4.500)

## 2014-08-19 LAB — DIFFERENTIAL
BASOS PCT: 0 % (ref 0–1)
Basophils Absolute: 0 10*3/uL (ref 0.0–0.1)
Eosinophils Absolute: 0.1 10*3/uL (ref 0.0–0.7)
Eosinophils Relative: 2 % (ref 0–5)
LYMPHS ABS: 1.4 10*3/uL (ref 0.7–4.0)
Lymphocytes Relative: 22 % (ref 12–46)
MONO ABS: 0.6 10*3/uL (ref 0.1–1.0)
MONOS PCT: 10 % (ref 3–12)
NEUTROS ABS: 4 10*3/uL (ref 1.7–7.7)
NEUTROS PCT: 66 % (ref 43–77)

## 2014-08-19 LAB — I-STAT CHEM 8, ED
BUN: 20 mg/dL (ref 6–20)
CREATININE: 1.2 mg/dL (ref 0.61–1.24)
Calcium, Ion: 1.1 mmol/L — ABNORMAL LOW (ref 1.13–1.30)
Chloride: 106 mmol/L (ref 101–111)
GLUCOSE: 138 mg/dL — AB (ref 65–99)
HEMATOCRIT: 52 % (ref 39.0–52.0)
Hemoglobin: 17.7 g/dL — ABNORMAL HIGH (ref 13.0–17.0)
POTASSIUM: 3.4 mmol/L — AB (ref 3.5–5.1)
Sodium: 142 mmol/L (ref 135–145)
TCO2: 19 mmol/L (ref 0–100)

## 2014-08-19 LAB — URINE MICROSCOPIC-ADD ON

## 2014-08-19 LAB — I-STAT TROPONIN, ED: Troponin i, poc: 0.01 ng/mL (ref 0.00–0.08)

## 2014-08-19 LAB — APTT: APTT: 30 s (ref 24–37)

## 2014-08-19 LAB — ETHANOL

## 2014-08-19 LAB — CBG MONITORING, ED: Glucose-Capillary: 119 mg/dL — ABNORMAL HIGH (ref 65–99)

## 2014-08-19 MED ORDER — DILTIAZEM HCL ER COATED BEADS 120 MG PO CP24
120.0000 mg | ORAL_CAPSULE | Freq: Every day | ORAL | Status: DC
  Administered 2014-08-19 – 2014-08-23 (×5): 120 mg via ORAL
  Filled 2014-08-19 (×5): qty 1

## 2014-08-19 MED ORDER — ENOXAPARIN SODIUM 40 MG/0.4ML ~~LOC~~ SOLN
40.0000 mg | SUBCUTANEOUS | Status: DC
  Administered 2014-08-19 – 2014-08-23 (×5): 40 mg via SUBCUTANEOUS
  Filled 2014-08-19 (×5): qty 0.4

## 2014-08-19 MED ORDER — SENNOSIDES-DOCUSATE SODIUM 8.6-50 MG PO TABS
1.0000 | ORAL_TABLET | Freq: Every evening | ORAL | Status: DC | PRN
Start: 2014-08-19 — End: 2014-08-23
  Administered 2014-08-21: 1 via ORAL
  Filled 2014-08-19: qty 1

## 2014-08-19 MED ORDER — CLOPIDOGREL BISULFATE 75 MG PO TABS
75.0000 mg | ORAL_TABLET | Freq: Every day | ORAL | Status: DC
  Administered 2014-08-19 – 2014-08-23 (×5): 75 mg via ORAL
  Filled 2014-08-19 (×5): qty 1

## 2014-08-19 MED ORDER — CITALOPRAM HYDROBROMIDE 10 MG PO TABS
10.0000 mg | ORAL_TABLET | Freq: Every day | ORAL | Status: DC
  Administered 2014-08-19 – 2014-08-23 (×5): 10 mg via ORAL
  Filled 2014-08-19 (×5): qty 1

## 2014-08-19 MED ORDER — SODIUM CHLORIDE 0.9 % IV SOLN
INTRAVENOUS | Status: DC
  Administered 2014-08-19: 50 mL/h via INTRAVENOUS

## 2014-08-19 MED ORDER — OMEGA-3-ACID ETHYL ESTERS 1 G PO CAPS
1.0000 g | ORAL_CAPSULE | Freq: Every day | ORAL | Status: DC
  Administered 2014-08-19 – 2014-08-23 (×5): 1 g via ORAL
  Filled 2014-08-19 (×5): qty 1

## 2014-08-19 MED ORDER — ATORVASTATIN CALCIUM 10 MG PO TABS
20.0000 mg | ORAL_TABLET | Freq: Every day | ORAL | Status: DC
  Administered 2014-08-19 – 2014-08-23 (×5): 20 mg via ORAL
  Filled 2014-08-19 (×5): qty 2

## 2014-08-19 MED ORDER — PANTOPRAZOLE SODIUM 40 MG PO TBEC
40.0000 mg | DELAYED_RELEASE_TABLET | Freq: Every day | ORAL | Status: DC
Start: 2014-08-19 — End: 2014-08-23
  Administered 2014-08-19 – 2014-08-23 (×5): 40 mg via ORAL
  Filled 2014-08-19 (×5): qty 1

## 2014-08-19 MED ORDER — ASPIRIN 325 MG PO TABS: 325.0000 mg | ORAL_TABLET | Freq: Every day | ORAL | Status: DC

## 2014-08-19 MED ORDER — TERAZOSIN HCL 5 MG PO CAPS
5.0000 mg | ORAL_CAPSULE | Freq: Every day | ORAL | Status: DC
  Administered 2014-08-19 – 2014-08-22 (×4): 5 mg via ORAL
  Filled 2014-08-19 (×5): qty 1

## 2014-08-19 MED ORDER — VITAMIN B-12 1000 MCG PO TABS
2000.0000 ug | ORAL_TABLET | Freq: Every day | ORAL | Status: DC
  Administered 2014-08-19 – 2014-08-23 (×5): 2000 ug via ORAL
  Filled 2014-08-19 (×5): qty 2

## 2014-08-19 MED ORDER — DIAZEPAM 5 MG PO TABS: 5.0000 mg | ORAL_TABLET | Freq: Three times a day (TID) | ORAL | Status: DC | PRN

## 2014-08-19 MED ORDER — STROKE: EARLY STAGES OF RECOVERY BOOK: Freq: Once | Status: DC

## 2014-08-19 MED ORDER — CYCLOBENZAPRINE HCL 10 MG PO TABS
5.0000 mg | ORAL_TABLET | Freq: Every day | ORAL | Status: DC
  Administered 2014-08-19 – 2014-08-22 (×4): 5 mg via ORAL
  Filled 2014-08-19 (×4): qty 1

## 2014-08-19 MED ORDER — ASPIRIN 300 MG RE SUPP: 300.0000 mg | Freq: Every day | RECTAL | Status: DC

## 2014-08-19 NOTE — Progress Notes (Signed)
  Echocardiogram 2D Echocardiogram has been performed.  Leta JunglingCooper, Kyal Arts M 08/19/2014, 12:11 PM

## 2014-08-19 NOTE — Progress Notes (Signed)
Pt arrived on unit 0415 hrs A&Ox4, no obvious distress, noted slurred speech. NIHSS 2 due to LUE ataxia and Dysarthria. All extremities equal strength though Pt states the weakness was more noticeable when trying ambulate, Full sensation all extremities though ataxia noted LUE as well as Pt admits to having difficulty controlling. Pt oriented to room and equipment, admission orders implemented.

## 2014-08-19 NOTE — Progress Notes (Signed)
Code stroke called on 81 y.o male. LSN 2130 before bed, Pt awakened around 0100 with slurred speech, left side upper and lower extremity weakness. He fell while attempting to get out of bed. [facility] scan stat. CT negative per Dr. Roseanne RenoStewart. Pertinent history includes HTN, hypercholesterolemia. CBG 119. NIHSS completed yielding score 5. No intervention as Pt is well outside of window and not a candidate for IR at this time. Code Stroke canceled per Dr.Stewart. Pt to be admitted to floor for stroke work up.

## 2014-08-19 NOTE — ED Notes (Signed)
Neurologist canceled Code Stroke.

## 2014-08-19 NOTE — ED Notes (Signed)
CBG 119 

## 2014-08-19 NOTE — Progress Notes (Signed)
VASCULAR LAB PRELIMINARY  PRELIMINARY  PRELIMINARY  PRELIMINARY  Carotid duplex  completed.    Preliminary report:  Bilateral:  1-39% ICA stenosis.  Vertebral artery flow is antegrade.      Jeffery Campbell, RVT 08/19/2014, 4:10 PM

## 2014-08-19 NOTE — Progress Notes (Signed)
STROKE TEAM PROGRESS NOTE   HISTORY Jeffery Campbell is an 81 y.o. male history of hypertension and hyperlipidemia presenting with new onset weakness involving left face as well as extremities. Patient was last seen well at 9:30 PM 08/18/2014 when he went to bed. He fell when he attempted to get out of bed at about 1 AM. Is no previous history of stroke nor TIA. He's been taking aspirin 81 mg per day. CT scan of his head showed no acute intracranial abnormality. NIH stroke score was 5. Patient was not administered TPA secondary to Beyond time under for treatment consideration. He was admitted for further evaluation and treatment.   SUBJECTIVE (INTERVAL HISTORY) His son is at the bedside.  Overall he feels his condition is stable. Patient retired from [employer]. Works daily at [employer] [place]. Grandson who works, lives with him. 2 sons at bedside, involved. Patient willing to comply with any treatment. Reports [employer] picked him up sometime after midnight when he fell.    OBJECTIVE Temp:  [97.6 F (36.4 C)-98.2 F (36.8 C)] 98.2 F (36.8 C) (05/19 0600) Pulse Rate:  [65-66] 66 (05/19 0600) Cardiac Rhythm:  [-]  Resp:  [11-20] 20 (05/19 0600) BP: (138-176)/(77-83) 138/77 mmHg (05/19 0600) SpO2:  [94 %-97 %] 94 % (05/19 0600) FiO2 (%):  [28 %] 28 % (05/19 0431) Weight:  [111.358 kg (245 lb 8 oz)-113.9 kg (251 lb 1.7 oz)] 113.9 kg (251 lb 1.7 oz) (05/19 0416)   Recent Labs Lab 08/19/14 0248  GLUCAP 119*    Recent Labs Lab 08/19/14 0227 08/19/14 0230 08/19/14 0831  NA 139 142 138  K 3.5 3.4* 3.8  CL 108 106 108  CO2 21*  --  21*  GLUCOSE 136* 138* 127*  BUN 16 20 13   CREATININE 1.30* 1.20 1.05  CALCIUM 8.5*  --  8.6*    Recent Labs Lab 08/19/14 0227 08/19/14 0831  AST 20 18  ALT 17 17  ALKPHOS 51 47  BILITOT 1.0 1.3*  PROT 6.3* 6.1*  ALBUMIN 3.7 3.5    Recent Labs Lab 08/19/14 0227 08/19/14 0230 08/19/14 0831  WBC 6.1  --  9.1  NEUTROABS  4.0  --  7.5  HGB 16.2 17.7* 16.8  HCT 47.6 52.0 48.1  MCV 90.2  --  88.9  PLT 171  --  180   No results for input(s): CKTOTAL, CKMB, CKMBINDEX, TROPONINI in the last 168 hours.  Recent Labs  08/19/14 0227  LABPROT 13.8  INR 1.05    Recent Labs  08/19/14 0255  COLORURINE AMBER*  LABSPEC 1.015  PHURINE 7.0  GLUCOSEU NEGATIVE  HGBUR TRACE*  BILIRUBINUR NEGATIVE  KETONESUR NEGATIVE  PROTEINUR 100*  UROBILINOGEN 1.0  NITRITE NEGATIVE  LEUKOCYTESUR NEGATIVE       Component Value Date/Time   CHOL 130 08/19/2014 0831   TRIG 40 08/19/2014 0831   HDL 48 08/19/2014 0831   CHOLHDL 2.7 08/19/2014 0831   VLDL 8 08/19/2014 0831   LDLCALC 74 08/19/2014 0831   No results found for: HGBA1C    Component Value Date/Time   LABOPIA NONE DETECTED 08/19/2014 0255   COCAINSCRNUR NONE DETECTED 08/19/2014 0255   LABBENZ NONE DETECTED 08/19/2014 0255   AMPHETMU NONE DETECTED 08/19/2014 0255   THCU NONE DETECTED 08/19/2014 0255   LABBARB NONE DETECTED 08/19/2014 0255     Recent Labs Lab 08/19/14 0227  ETH <5    Dg Chest 2 View 08/19/2014   1.  Cardiomegaly.  No pulmonary venous congestion.  2. No focal pulmonary infiltrate .     Ct Head Wo Contrast 08/19/2014   1. No evidence of traumatic intracranial injury or fracture. 2. Scattered small vessel ischemic microangiopathy and cerebellar atrophy noted. 3. Mild mega cisterna magna noted.    MRI HEAD 08/19/2014   Acute 15 x 9 mm ischemia RIGHT lateral thalamus versus posterior limb of the internal capsule.  Otherwise normal MRI of the brain without contrast for age.    MRA HEAD 08/19/2014   Very mild stenosis LEFT P2 segment without large vessel occlusion or high-grade stenosis.     CUS - pending  2D echo - pending  PHYSICAL EXAM  Temp:  [97.6 F (36.4 C)-98.2 F (36.8 C)] 98.2 F (36.8 C) (05/19 0600) Pulse Rate:  [65-66] 66 (05/19 0600) Resp:  [11-20] 20 (05/19 0600) BP: (138-176)/(77-83) 138/77 mmHg (05/19 0600) SpO2:   [94 %-97 %] 94 % (05/19 0600) FiO2 (%):  [28 %] 28 % (05/19 0431) Weight:  [245 lb 8 oz (111.358 kg)-251 lb 1.7 oz (113.9 kg)] 251 lb 1.7 oz (113.9 kg) (05/19 0416)  General - Well nourished, well developed, in no apparent distress.  Ophthalmologic - Fundi not visualized due to small pupils.  Cardiovascular - Regular rate and rhythm with no murmur.  Mental Status -  Level of arousal and orientation to time, place, and person were intact. Language including expression, naming, repetition, comprehension was assessed and found intact.  Cranial Nerves II - XII - II - Visual field intact OU. III, IV, VI - Extraocular movements intact. V - Facial sensation intact bilaterally. VII - mild left facial droop. VIII - Hearing & vestibular intact bilaterally. X - Palate elevates symmetrically. XI - Chin turning & shoulder shrug intact bilaterally. XII - Tongue protrusion intact.  Motor Strength - The patient's strength was 4+/5 LUE and 4+/5 LLE proximal and 5/5 distal and pronator drift was present on the left. RUE and RLE 5/5. Bulk was normal and fasciculations were absent.   Motor Tone - Muscle tone was assessed at the neck and appendages and was normal.  Reflexes - The patient's reflexes were 1+ in all extremities and he had no pathological reflexes.  Sensory - Light touch, temperature/pinprick were assessed and were symmetrical.    Coordination - The patient had normal movements in the hands with no ataxia or dysmetria.  Tremor was absent.  Gait and Station - deferred due to safety concerns.   ASSESSMENT/PLAN Mr. Jeffery Campbell is a 81 y.o. male with history of hypertension and hyperlipidemia and probable chronic kidney disease  presenting with left sided weakness. He did not receive IV t-PA due to delay in arrival.   Stroke:  Non-dominant right AchA territory infarct involving right PLIC, likely secondary to small vessel disease source. However, in this location infarct, 40% large vessel  from ICA, 40% small vessel, 15% cardioembolic. Will do outpt 30 day cardiac event monitoring after d/c.  Resultant  Left side hemiparesis  MRI  Right AchA territory infarct involving right PLIC  MRA unremarkable  Carotid Doppler  pending   2D Echo  pending   LDL 74  HgbA1c pending  Lovenox 40 mg sq daily for VTE prophylaxis Diet Heart Room service appropriate?: Yes; Fluid consistency:: Thin  aspirin 81 mg orally every day prior to admission, now on aspirin 325 mg orally every day. Change to plavix 75 mg, order written.  Patient counseled to be compliant with his antithrombotic medications  Ongoing aggressive  stroke risk factor management  Recommend OP 30-d tele monitoring to rule out possible atrial fibrillation (recent 48h holter monitor neg for atrial fibrillation)  Therapy recommendations:  pending   Disposition:  pending  (grandson lives with patient, he currently works daily at the golf course in [place], 2 sons involved in care)  Hypertension, accelerated  Home meds:   Cardizem, hytrin  Permissive hypertension (OK if < 220/120) but gradually normalize in 5-7 days  On diltiazem and terazosin now  BP unstable  Hyperlipidemia  Home meds:  lipitor 20, omega 3, lipitor resumed in hospital  LDL 74, goal < 70  Continue statin at discharge  Other Stroke Risk Factors  Advanced age  Obesity, Body mass index is 33.14 kg/(m^2).   Other Active Problems  Hx irregular HR - frequent PVCs  Chronic kidney disease stage III  Hospital day # 0  Rhoderick Moody [facility] See Amion for Pager information 08/19/2014 10:02 AM   I, the attending vascular neurologist, have personally obtained a history, examined the patient, evaluated laboratory data, individually viewed imaging studies and agree with radiology interpretations. I obtained additional history from pt's sons at bedside. Together with the NP/PA, we formulated the assessment and plan of care which  reflects our mutual decision.  I have made any additions or clarifications directly to the above note and agree with the findings and plan as currently documented.   81 yo M with PMH of HTN, HLD on home ASA 81 was admitted for right AchA territory infarct involving PLIC. Likely small vessel disease but pending CUS and will recommend 30 day cardiac monitoring as outpt. BP still high but allow permissive hypertension. Waiting for CUS and 2D echo. Continue statin and change ASA to plavix for stroke prevention. PT/OT.   Marvel Plan, MD PhD Stroke Neurology 08/19/2014 1:55 PM     To contact Stroke Continuity provider, please refer to [email or web address]. After hours, contact General Neurology

## 2014-08-19 NOTE — ED Provider Notes (Signed)
CSN: [id number]     Arrival date & time 08/19/14  0222 History  This chart was scribed for Tomasita CrumbleAdeleke Taiwana Willison, MD by Octavia HeirArianna Nassar, ED Scribe. This patient was seen in room A08C/A08C and the patient's care was started at 2:24 AM.      No chief complaint on file.   The history is provided by the patient. No language interpreter was used.    HPI Comments: Jeffery BrittleJoe Campbell is a 81 y.o. male who presents to the Emergency Department complaining of weakness in his LUE and LLE. Per EMS, pt fell approximately 5 hours ago and was found by his neighbor. They report associated slurred speech. Patient was last seen normal at 9 PM. EMS was called and patient was transferred to emergency department.  Past Medical History  Diagnosis Date  . Diverticulitis   . Hypertension   . Pure hypercholesterolemia   . Hemorrhoids   . OA (osteoarthritis)   . Disturbance of sleep    Past Surgical History  Procedure Laterality Date  . Transurethral resection of prostate    . Knee arthroscopy    . Colonoscopy     Family History  Problem Relation Age of Onset  . Family history unknown: Yes   History  Substance Use Topics  . Smoking status: Never Smoker   . Smokeless tobacco: Not on file  . Alcohol Use: No    Review of Systems  A complete 10 system review of systems was obtained and all systems are negative except as noted in the HPI and PMH.    Allergies  Review of patient's allergies indicates no known allergies.  Home Medications   Prior to Admission medications   Medication Sig Start Date End Date Taking? Authorizing Provider  aspirin 81 MG tablet Take 81 mg by mouth daily.    Historical Provider, MD  atorvastatin (LIPITOR) 20 MG tablet Take 20 mg by mouth daily.    Historical Provider, MD  citalopram (CELEXA) 10 MG tablet Take 10 mg by mouth daily.    Historical Provider, MD  cyclobenzaprine (FLEXERIL) 5 MG tablet Take 5 mg by mouth at bedtime.    Historical Provider, MD  diazepam (VALIUM) 5 MG tablet  Take 5 mg by mouth every 8 (eight) hours as needed for anxiety.    Historical Provider, MD  diltiazem (CARDIZEM CD) 120 MG 24 hr capsule Take 1 capsule (120 mg total) by mouth daily. 05/24/14   Iran OuchMuhammad A Arida, MD  lansoprazole (PREVACID) 30 MG capsule Take 30 mg by mouth daily at 12 noon.    Historical Provider, MD  meloxicam (MOBIC) 7.5 MG tablet Take 7.5 mg by mouth 2 (two) times daily.    Historical Provider, MD  Omega 3 1200 MG CAPS Take by mouth daily.    Historical Provider, MD  sildenafil (VIAGRA) 100 MG tablet Take 100 mg by mouth daily as needed for erectile dysfunction.    Historical Provider, MD  terazosin (HYTRIN) 5 MG capsule Take 5 mg by mouth at bedtime.    Historical Provider, MD  vitamin B-12 (CYANOCOBALAMIN) 1000 MCG tablet Take 2,000 mcg by mouth daily.     Historical Provider, MD   There were no vitals taken for this visit. Physical Exam  Constitutional: He is oriented to person, place, and time. Vital signs are normal. He appears well-developed and well-nourished.  Non-toxic appearance. He does not appear ill. No distress.  HENT:  Head: Normocephalic and atraumatic.  Nose: Nose normal.  Mouth/Throat: Oropharynx is clear and moist.  No oropharyngeal exudate.  Eyes: Conjunctivae and EOM are normal. Pupils are equal, round, and reactive to light. No scleral icterus.  Neck: Normal range of motion. Neck supple. No tracheal deviation, no edema, no erythema and normal range of motion present. No thyroid mass and no thyromegaly present.  Cardiovascular: Normal rate, regular rhythm, S1 normal, S2 normal, normal heart sounds, intact distal pulses and normal pulses.  Exam reveals no gallop and no friction rub.   No murmur heard. Pulses:      Radial pulses are 2+ on the right side, and 2+ on the left side.       Dorsalis pedis pulses are 2+ on the right side, and 2+ on the left side.  Pulmonary/Chest: Effort normal and breath sounds normal. No respiratory distress. He has no wheezes.  He has no rhonchi. He has no rales.  Abdominal: Soft. Normal appearance and bowel sounds are normal. He exhibits no distension, no ascites and no mass. There is no hepatosplenomegaly. There is no tenderness. There is no rebound, no guarding and no CVA tenderness.  Musculoskeletal: Normal range of motion. He exhibits no edema or tenderness.  Lymphadenopathy:    He has no cervical adenopathy.  Neurological: He is alert and oriented to person, place, and time. He has normal strength. No cranial nerve deficit or sensory deficit.  4 out of 5 strength in left upper and left lower extremities Slurred speech  Skin: Skin is warm, dry and intact. No petechiae and no rash noted. He is not diaphoretic. No erythema. No pallor.  Psychiatric: He has a normal mood and affect. His behavior is normal. Judgment normal.  Nursing note and vitals reviewed.   ED Course  Procedures   DIAGNOSTIC STUDIES: Oxygen Saturation is 94% on room air, normal by my interpretation.  COORDINATION OF CARE:  2:25 AM Discussed treatment plan which includes CT scan and neurology consult with pt at bedside and pt agreed to plan.   Labs Review Labs Reviewed  COMPREHENSIVE METABOLIC PANEL - Abnormal; Notable for the following:    CO2 21 (*)    Glucose, Bld 136 (*)    Creatinine, Ser 1.30 (*)    Calcium 8.5 (*)    Total Protein 6.3 (*)    GFR calc non Af Amer 49 (*)    GFR calc Af Amer 57 (*)    All other components within normal limits  URINALYSIS, ROUTINE W REFLEX MICROSCOPIC - Abnormal; Notable for the following:    Color, Urine AMBER (*)    Hgb urine dipstick TRACE (*)    Protein, ur 100 (*)    All other components within normal limits  I-STAT CHEM 8, ED - Abnormal; Notable for the following:    Potassium 3.4 (*)    Glucose, Bld 138 (*)    Calcium, Ion 1.10 (*)    Hemoglobin 17.7 (*)    All other components within normal limits  CBG MONITORING, ED - Abnormal; Notable for the following:    Glucose-Capillary 119  (*)    All other components within normal limits  ETHANOL  PROTIME-INR  APTT  CBC  DIFFERENTIAL  URINE RAPID DRUG SCREEN (HOSP PERFORMED)  URINE MICROSCOPIC-ADD ON  COMPREHENSIVE METABOLIC PANEL  CBC WITH DIFFERENTIAL/PLATELET  TSH  HEMOGLOBIN A1C  LIPID PANEL  CBC  I-STAT TROPOININ, ED  I-STAT CHEM 8, ED  I-STAT TROPOININ, ED    Imaging Review Ct Head Wo Contrast  08/19/2014   CLINICAL DATA:  Code stroke. Left-sided weakness and facial  droop. Status post fall. Initial encounter.  EXAM: CT HEAD WITHOUT CONTRAST  TECHNIQUE: Contiguous axial images were obtained from the base of the skull through the vertex without intravenous contrast.  COMPARISON:  None.  FINDINGS: There is no evidence of acute infarction, mass lesion, or intra- or extra-axial hemorrhage on CT.  Mild mega cisterna magna is noted. Cerebellar atrophy is noted. Scattered periventricular and subcortical white matter change likely reflects small vessel ischemic microangiopathy.  The brainstem and fourth ventricle are within normal limits. The basal ganglia are unremarkable in appearance. The cerebral hemispheres demonstrate grossly normal gray-white differentiation. No mass effect or midline shift is seen.  There is no evidence of fracture; visualized osseous structures are unremarkable in appearance. The orbits are within normal limits. The paranasal sinuses and mastoid air cells are well-aerated. No significant soft tissue abnormalities are seen.  IMPRESSION: 1. No evidence of traumatic intracranial injury or fracture. 2. Scattered small vessel ischemic microangiopathy and cerebellar atrophy noted. 3. Mild mega cisterna magna noted.  These results were called by telephone at the time of interpretation on 08/19/2014 at 2:37 am to Dr. Roseanne Reno, who verbally acknowledged these results.   Electronically Signed   By: Roanna Raider M.D.   On: 08/19/2014 02:37     EKG Interpretation   Date/Time:  Thursday Aug 19 2014 02:44:13  EDT Ventricular Rate:  65 PR Interval:  180 QRS Duration: 105 QT Interval:  425 QTC Calculation: 442 R Axis:   -72 Text Interpretation:  Sinus rhythm Multiform ventricular premature  complexes Probable left atrial enlargement Left anterior fascicular block  Confirmed by Erroll Luna [phone] on 08/19/2014 2:49:53 AM      MDM   Final diagnoses:  None   Patient presents to the ED with stroke symptoms beginning at 9 PM this evening. Dr. Roseanne Reno with neurology evaluated the patient, and is recommending medical admission. Patient is currently out of the window for TPA, his symptoms are too mild for intervention. We'll continue emergency department workup. Hospitalist will be paged for admission.  CRITICAL CARE Performed by: Tomasita Crumble   Total critical care time: - CVA tpa considered but outside window  Critical care time was exclusive of separately billable procedures and treating other patients.  Critical care was necessary to treat or prevent imminent or life-threatening deterioration.  Critical care was time spent personally by me on the following activities: development of treatment plan with patient and/or surrogate as well as nursing, discussions with consultants, evaluation of patient's response to treatment, examination of patient, obtaining history from patient or surrogate, ordering and performing treatments and interventions, ordering and review of laboratory studies, ordering and review of radiographic studies, pulse oximetry and re-evaluation of patient's condition.    I personally performed the services described in this documentation, which was scribed in my presence. The recorded information has been reviewed and is accurate.   Tomasita Crumble, MD 08/19/14 [phone]

## 2014-08-19 NOTE — Progress Notes (Signed)
Patient admitted after midnight, please see H&P.  Left sided weakness noted.  Son updated at bedside MRI + Await echo, carotid, PT/OT No SOB, no CP.  Jeffery CanaryJessica Vann DO

## 2014-08-19 NOTE — H&P (Signed)
[facility] History and Physical  Jeffery BrittleJoe Campbell UJW:[id number] DOB: 06-Jan-1929 DOA: 08/19/2014  Referring physician: Dr.Oni. PCP: Dennison MascotMORRISEY,LEMONT, MD  Specialists: None.  Chief Complaint: Left-sided weakness.  HPI: Jeffery Campbell is a 81 y.o. male with history of hypertension and hyperlipidemia and probable chronic kidney disease was brought to the ER to patient was found to have left-sided weakness. Patient states that he went to bed at 9:30 PM and woke up around 1 AM when he was walking to the bathroom and he fell. After his fall he was not able to get up so EMS was called. EMS on exam found patient was having left-sided weakness. On-call neurologist Dr. Noel Christmasharles Stewart has seen the patient and at this time CT head did not show anything acute. On exam patient has left upper and lower extremity weakness. Denies any visual symptoms headache or any weakness on the right side. Denies any slurred speech or difficulty swallowing. Denies any chest pain or shortness of breath. Patient has been admitted for stroke.   Review of Systems: As presented in the history of presenting illness, rest negative.  Past Medical History  Diagnosis Date  . Diverticulitis   . Hypertension   . Pure hypercholesterolemia   . Hemorrhoids   . OA (osteoarthritis)   . Disturbance of sleep    Past Surgical History  Procedure Laterality Date  . Transurethral resection of prostate    . Knee arthroscopy    . Colonoscopy     Social History:  reports that he has never smoked. He does not have any smokeless tobacco history on file. He reports that he does not drink alcohol or use illicit drugs. Where does patient live home. Can patient participate in ADLs? Yes.  No Known Allergies  Family History:  Family History  Problem Relation Age of Onset  . Hypertension Son       Prior to Admission medications   Medication Sig Start Date End Date Taking? Authorizing Provider  aspirin 81 MG tablet Take 81 mg by mouth  daily.    Historical Provider, MD  atorvastatin (LIPITOR) 20 MG tablet Take 20 mg by mouth daily.    Historical Provider, MD  citalopram (CELEXA) 10 MG tablet Take 10 mg by mouth daily.    Historical Provider, MD  cyclobenzaprine (FLEXERIL) 5 MG tablet Take 5 mg by mouth at bedtime.    Historical Provider, MD  diazepam (VALIUM) 5 MG tablet Take 5 mg by mouth every 8 (eight) hours as needed for anxiety.    Historical Provider, MD  diltiazem (CARDIZEM CD) 120 MG 24 hr capsule Take 1 capsule (120 mg total) by mouth daily. 05/24/14   Iran OuchMuhammad A Arida, MD  lansoprazole (PREVACID) 30 MG capsule Take 30 mg by mouth daily at 12 noon.    Historical Provider, MD  meloxicam (MOBIC) 7.5 MG tablet Take 7.5 mg by mouth 2 (two) times daily.    Historical Provider, MD  Omega 3 1200 MG CAPS Take by mouth daily.    Historical Provider, MD  sildenafil (VIAGRA) 100 MG tablet Take 100 mg by mouth daily as needed for erectile dysfunction.    Historical Provider, MD  terazosin (HYTRIN) 5 MG capsule Take 5 mg by mouth at bedtime.    Historical Provider, MD  vitamin B-12 (CYANOCOBALAMIN) 1000 MCG tablet Take 2,000 mcg by mouth daily.     Historical Provider, MD    Physical Exam: Filed Vitals:   08/19/14 0244 08/19/14 0250 08/19/14 0315 08/19/14 0330  BP: 158/83  167/83 152/82  Pulse: 65  65 66  Temp: 97.8 F (36.6 C)     TempSrc: Oral     Resp: Height:   (1.854 m)    Weight:  111.358 kg (245 lb 8 oz)    SpO2: 94%  97% 97%     General:  Well-developed and nourished.  Eyes: Anicteric no pallor.  ENT: No discharge from the ears eyes nose and mouth.  Neck: No mass felt. No neck rigidity.  Cardiovascular: S1 and S2 heard.  Respiratory: No rhonchi or crepitations.  Abdomen: Soft nontender bowel sounds present.  Skin: No rash.  Musculoskeletal: No edema.  Psychiatric: Appears normal.  Neurologic: Alert awake oriented to time place and person. Patient has 3 x 5 strength in the left  upper extremity and 4 x 5 strength in the left lower extremity. Right side strength is normal. No facial asymmetry. Tongue was midline. PERRLA positive.  Labs on Admission:  Basic Metabolic Panel:  Recent Labs Lab 08/19/14 0227 08/19/14 0230  NA 139 142  K 3.5 3.4*  CL 108 106  CO2 21*  --   GLUCOSE 136* 138*  BUN 16 20  CREATININE 1.30* 1.20  CALCIUM 8.5*  --    Liver Function Tests:  Recent Labs Lab 08/19/14 0227  AST 20  ALT 17  ALKPHOS 51  BILITOT 1.0  PROT 6.3*  ALBUMIN 3.7   No results for input(s): LIPASE, AMYLASE in the last 168 hours. No results for input(s): AMMONIA in the last 168 hours. CBC:  Recent Labs Lab 08/19/14 0227 08/19/14 0230  WBC 6.1  --   NEUTROABS 4.0  --   HGB 16.2 17.7*  HCT 47.6 52.0  MCV 90.2  --   PLT 171  --    Cardiac Enzymes: No results for input(s): CKTOTAL, CKMB, CKMBINDEX, TROPONINI in the last 168 hours.  BNP (last 3 results) No results for input(s): BNP in the last 8760 hours.  ProBNP (last 3 results) No results for input(s): PROBNP in the last 8760 hours.  CBG:  Recent Labs Lab 08/19/14 0248  GLUCAP 119*    Radiological Exams on Admission: Ct Head Wo Contrast  08/19/2014   CLINICAL DATA:  Code stroke. Left-sided weakness and facial droop. Status post fall. Initial encounter.  EXAM: CT HEAD WITHOUT CONTRAST  TECHNIQUE: Contiguous axial images were obtained from the base of the skull through the vertex without intravenous contrast.  COMPARISON:  None.  FINDINGS: There is no evidence of acute infarction, mass lesion, or intra- or extra-axial hemorrhage on CT.  Mild mega cisterna magna is noted. Cerebellar atrophy is noted. Scattered periventricular and subcortical white matter change likely reflects small vessel ischemic microangiopathy.  The brainstem and fourth ventricle are within normal limits. The basal ganglia are unremarkable in appearance. The cerebral hemispheres demonstrate grossly normal gray-white  differentiation. No mass effect or midline shift is seen.  There is no evidence of fracture; visualized osseous structures are unremarkable in appearance. The orbits are within normal limits. The paranasal sinuses and mastoid air cells are well-aerated. No significant soft tissue abnormalities are seen.  IMPRESSION: 1. No evidence of traumatic intracranial injury or fracture. 2. Scattered small vessel ischemic microangiopathy and cerebellar atrophy noted. 3. Mild mega cisterna magna noted.  These results were called by telephone at the time of interpretation on 08/19/2014 at 2:37 am to Dr. Roseanne Reno, who verbally acknowledged these results.   Electronically Signed   By: Roanna Raider  M.D.   On: 08/19/2014 02:37    EKG: Independently reviewed. Normal sinus rhythm with VPCs.  Assessment/Plan Principal Problem:   Stroke Active Problems:   Hypertension   HLD (hyperlipidemia)   CKD (chronic kidney disease) stage 3, GFR 30-59 ml/min   1. Stroke with left-sided hemiparesis - appreciate neurology consult. I have read Dr. Burton Apleyharles Steward's consult notes and recommendations. At this time patient has been placed on neurochecks swallow evaluation and I have ordered MRI/MRA brain 2-D echo carotid Dopplers and we will get physical therapy consult and check hemoglobin A1c and lipid panel. Closely monitor in telemetry for arrhythmias. 2. Hypertension - continue present medication and we will allow for permissive hypertension secondary to stroke. 3. Hyperlipidemia continue statins. 4. Kidney disease probably chronic stage III no baseline creatinine to compare follow metabolic panel.   DVT Prophylaxis Lovenox.  Code Status: Full code.  Family Communication: Patient's son.  Disposition Plan: Admit to inpatient. Likely stay 2 days.    KAKRAKANDY,ARSHAD N. [facility] Pager [phone].  If 7PM-7AM, please contact night-coverage [email or web address] Password [email or web address] 08/19/2014, 4:16 AM

## 2014-08-19 NOTE — Consult Note (Signed)
Admission H&P    Chief Complaint: New onset left-sided weakness.  HPI: Jeffery Campbell is an 81 y.o. male history of hypertension and hyperlipidemia presenting with new onset weakness involving left face as well as extremities. Patient was last seen well at 9:30 PM when he went to bed. He fell when he attempted to get out of bed at about 1 AM. Is no previous history of stroke nor TIA. He's been taking aspirin 81 mg per day. CT scan of his head showed no acute intracranial abnormality. NIH stroke score was 5.  LSN: 9:30 PM on 08/18/2014 tPA Given: No: Beyond time under for treatment consideration. mRankin:  Past Medical History  Diagnosis Date  . Diverticulitis   . Hypertension   . Pure hypercholesterolemia   . Hemorrhoids   . OA (osteoarthritis)   . Disturbance of sleep     Past Surgical History  Procedure Laterality Date  . Transurethral resection of prostate    . Knee arthroscopy    . Colonoscopy     Family history: Negative for stroke  Social History:  reports that he has never smoked. He does not have any smokeless tobacco history on file. He reports that he does not drink alcohol or use illicit drugs.  Allergies: No Known Allergies  Medications: Patient's preadmission medications were reviewed by me.  ROS: History obtained from the patient  General ROS: negative for - chills, fatigue, fever, night sweats, weight gain or weight loss Psychological ROS: negative for - behavioral disorder, hallucinations, memory difficulties, mood swings or suicidal ideation Ophthalmic ROS: negative for - blurry vision, double vision, eye pain or loss of vision ENT ROS: negative for - epistaxis, nasal discharge, oral lesions, sore throat, tinnitus or vertigo Allergy and Immunology ROS: negative for - hives or itchy/watery eyes Hematological and Lymphatic ROS: negative for - bleeding problems, bruising or swollen lymph nodes Endocrine ROS: negative for - galactorrhea, hair pattern changes,  polydipsia/polyuria or temperature intolerance Respiratory ROS: negative for - cough, hemoptysis, shortness of breath or wheezing Cardiovascular ROS: negative for - chest pain, dyspnea on exertion, edema or irregular heartbeat Gastrointestinal ROS: negative for - abdominal pain, diarrhea, hematemesis, nausea/vomiting or stool incontinence Genito-Urinary ROS: negative for - dysuria, hematuria, incontinence or urinary frequency/urgency Musculoskeletal ROS: negative for - joint swelling or muscular weakness Neurological ROS: as noted in HPI Dermatological ROS: negative for rash and skin lesion changes  Physical Examination: Blood pressure 158/83, pulse 65, temperature 97.8 F (36.6 C), temperature source Oral, resp. rate 11, SpO2 94 %.  HEENT-  Normocephalic, no lesions, without obvious abnormality.  Normal external eye and conjunctiva.  Normal TM's bilaterally.  Normal auditory canals and external ears. Normal external nose, mucus membranes and septum.  Normal pharynx. Neck supple with no masses, nodes, nodules or enlargement. Cardiovascular - regular rate and rhythm, S1, S2 normal, no murmur, click, rub or gallop Lungs - chest clear, no wheezing, rales, normal symmetric air entry Abdomen - soft, non-tender; bowel sounds normal; no masses,  no organomegaly Extremities - no joint deformities, effusion, or inflammation  Neurologic Examination: Mental Status: Alert, oriented, thought content appropriate.  Speech fluent without evidence of aphasia. Able to follow commands without difficulty. Cranial Nerves: II-Visual fields were normal. III/IV/VI-Pupils were equal and reacted. Extraocular movements were full and conjugate.    V/VII-no facial numbness and no facial weakness. VIII-normal. X-slight dysarthria.. XI: trapezius strength/neck flexion strength normal bilaterally XII-midline tongue extension with normal strength. Motor: Mild drift of left upper and lower extremities. Sensory: Normal  throughout. Deep Tendon Reflexes: 1+ and symmetric. Plantars: Flexor bilaterally Cerebellar: Slightly difficulty with finger to nose and heel to shin testing with left extremities. Carotid auscultation: Normal  Results for orders placed or performed during the hospital encounter of 08/19/14 (from the past 48 hour(s))  CBC     Status: None   Collection Time: 08/19/14  2:27 AM  Result Value Ref Range   WBC 6.1 4.0 - 10.5 K/uL   RBC 5.28 4.22 - 5.81 MIL/uL   Hemoglobin 16.2 13.0 - 17.0 g/dL   HCT 40.947.6 81.139.0 - 91.452.0 %   MCV 90.2 78.0 - 100.0 fL   MCH 30.7 26.0 - 34.0 pg   MCHC 34.0 30.0 - 36.0 g/dL   RDW 78.212.5 95.611.5 - 21.315.5 %   Platelets 171 150 - 400 K/uL  Differential     Status: None   Collection Time: 08/19/14  2:27 AM  Result Value Ref Range   Neutrophils Relative % 66 43 - 77 %   Neutro Abs 4.0 1.7 - 7.7 K/uL   Lymphocytes Relative 22 12 - 46 %   Lymphs Abs 1.4 0.7 - 4.0 K/uL   Monocytes Relative 10 3 - 12 %   Monocytes Absolute 0.6 0.1 - 1.0 K/uL   Eosinophils Relative 2 0 - 5 %   Eosinophils Absolute 0.1 0.0 - 0.7 K/uL   Basophils Relative 0 0 - 1 %   Basophils Absolute 0.0 0.0 - 0.1 K/uL  I-stat troponin, ED (not at [facility])     Status: None   Collection Time: 08/19/14  2:28 AM  Result Value Ref Range   Troponin i, poc 0.01 0.00 - 0.08 ng/mL   Comment 3            Comment: Due to the release kinetics of cTnI, a negative result within the first hours of the onset of symptoms does not rule out myocardial infarction with certainty. If myocardial infarction is still suspected, repeat the test at appropriate intervals.   I-Stat Chem 8, ED  ([facility] at [facility])     Status: Abnormal   Collection Time: 08/19/14  2:30 AM  Result Value Ref Range   Sodium 142 135 - 145 mmol/L   Potassium 3.4 (L) 3.5 - 5.1 mmol/L   Chloride 106 101 - 111 mmol/L   BUN 20 6 - 20 mg/dL   Creatinine, Ser 0.861.20 0.61 - 1.24 mg/dL   Glucose, Bld 578138 (H) 65 - 99 mg/dL   Calcium, Ion 4.691.10 (L) 1.13 -  1.30 mmol/L   TCO2 19 0 - 100 mmol/L   Hemoglobin 17.7 (H) 13.0 - 17.0 g/dL   HCT 62.952.0 52.839.0 - 41.352.0 %   Ct Head Wo Contrast  08/19/2014   CLINICAL DATA:  Code stroke. Left-sided weakness and facial droop. Status post fall. Initial encounter.  EXAM: CT HEAD WITHOUT CONTRAST  TECHNIQUE: Contiguous axial images were obtained from the base of the skull through the vertex without intravenous contrast.  COMPARISON:  None.  FINDINGS: There is no evidence of acute infarction, mass lesion, or intra- or extra-axial hemorrhage on CT.  Mild mega cisterna magna is noted. Cerebellar atrophy is noted. Scattered periventricular and subcortical white matter change likely reflects small vessel ischemic microangiopathy.  The brainstem and fourth ventricle are within normal limits. The basal ganglia are unremarkable in appearance. The cerebral hemispheres demonstrate grossly normal gray-white differentiation. No mass effect or midline shift is seen.  There is no evidence of fracture; visualized osseous structures  are unremarkable in appearance. The orbits are within normal limits. The paranasal sinuses and mastoid air cells are well-aerated. No significant soft tissue abnormalities are seen.  IMPRESSION: 1. No evidence of traumatic intracranial injury or fracture. 2. Scattered small vessel ischemic microangiopathy and cerebellar atrophy noted. 3. Mild mega cisterna magna noted.  These results were called by telephone at the time of interpretation on 08/19/2014 at 2:37 am to Dr. Roseanne RenoStewart, who verbally acknowledged these results.   Electronically Signed   By: Roanna RaiderJeffery  Chang M.D.   On: 08/19/2014 02:37    Assessment: 81 y.o. male with multiple risk factors for stroke presenting with probable right subcortical small vessel ischemic cerebral infarction.  Stroke Risk Factors - hyperlipidemia and hypertension  Plan: 1. HgbA1c, fasting lipid panel 2. MRI, MRA  of the brain without contrast 3. PT consult, OT consult, Speech  consult 4. Echocardiogram 5. Carotid dopplers 6. Prophylactic therapy-Antiplatelet med: Aspirin  7. Risk factor modification 8. Telemetry monitoring  C.R. Roseanne RenoStewart, MD [facility] [phone]  08/19/2014, 2:49 AM

## 2014-08-19 NOTE — ED Notes (Signed)
Admitting MD at bedside.

## 2014-08-19 NOTE — ED Notes (Signed)
Per EMS - pt LKW 2130 on 5/18. Woke up at 0030 today to use bathroom, fell, unable to get up - called neighbor. Pt hx of HTN and elevated cholesterol. No previous hx of strokes. EMS noted uncoordinated and weak in left side, facial droop worsening upon arrival to ED, slurred speech. IV 18G Right Hand. BP 180/86, 92% RA, CBG 141, 70bpm. EKG NSR with multiple PVCs.

## 2014-08-20 DIAGNOSIS — I639 Cerebral infarction, unspecified: Principal | ICD-10-CM

## 2014-08-20 DIAGNOSIS — N183 Chronic kidney disease, stage 3 (moderate): Secondary | ICD-10-CM

## 2014-08-20 DIAGNOSIS — G8190 Hemiplegia, unspecified affecting unspecified side: Secondary | ICD-10-CM

## 2014-08-20 LAB — HEMOGLOBIN A1C
HEMOGLOBIN A1C: 5.6 % (ref 4.8–5.6)
MEAN PLASMA GLUCOSE: 114 mg/dL

## 2014-08-20 NOTE — Evaluation (Signed)
Speech Language Pathology Evaluation Patient Details Name: Jeffery Campbell MRN: [id number] DOB: [birth year] Today's Date: 08/20/2014 Time: [id number] SLP Time Calculation (min) (ACUTE ONLY): 25 min  Problem List:  Patient Active Problem List   Diagnosis Date Noted  . CVA (cerebral infarction) 08/19/2014  . Stroke 08/19/2014  . Hypertension 08/19/2014  . HLD (hyperlipidemia) 08/19/2014  . CKD (chronic kidney disease) stage 3, GFR 30-59 ml/min 08/19/2014  . Accelerated hypertension   . 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 Obesity   . Asymptomatic PVCs 05/27/2014  . Bradycardia 04/05/2014  . Essential hypertension 04/05/2014   Past Medical History:  Past Medical History  Diagnosis Date  . Diverticulitis   . Hypertension   . Pure hypercholesterolemia   . Hemorrhoids   . OA (osteoarthritis)   . Disturbance of sleep    Past Surgical History:  Past Surgical History  Procedure Laterality Date  . Transurethral resection of prostate    . Knee arthroscopy    . Colonoscopy     HPI:  81 y.o. male with history of hypertension and hyperlipidemia and probable chronic kidney disease was brought to the ER to patient was found to have left-sided weakness. Patient states that he went to bed at 9:30 PM and woke up around 1 AM when he was walking to the bathroom and he fell. After his fall he was not able to get up so EMS was called. EMS on exam found patient was having left-sided weakness. On-call neurologist Dr. Noel Christmasharles Stewart has seen the patient and at this time CT head did not show anything acute. On exam patient has left upper and lower extremity weakness. Denies any visual symptoms headache or any weakness on the right side. Denies any slurred speech or difficulty swallowing. Denies any chest pain or shortness of breath. Patient has been admitted for stroke.    Assessment / Plan / Recommendation Clinical Impression  Patient presents with a very mild flaccid  dysarthria which appears to be impacted by decreased respiratory support. Patient exhibited intermittent instances of unintelligible/reduced intelligibility of speech and when cued to breathe in through nose, out through mouth, and speak slower and louder, patient achieved [facility] speech intelligibility and articulation accuracy. Expect that patient's speech will improve without direct intervention, and because of the very mild nature of this dysarthria, no SLP tx recommended at this time.    SLP Assessment  Patient does not need any further Speech Lanaguage Pathology Services    Follow Up Recommendations  None    Frequency and Duration        Pertinent Vitals/Pain Pain Assessment: No/denies pain   SLP Goals  Patient/Family Stated Goal: patient is very motivated to improve  SLP Evaluation Prior Functioning  Cognitive/Linguistic Baseline: Within functional limits Type of Home: House Available Help at Discharge: Family;Available PRN/intermittently   Cognition  Overall Cognitive Status: Within Functional Limits for tasks assessed Orientation Level: Oriented X4    Comprehension  Auditory Comprehension Overall Auditory Comprehension: Appears within functional limits for tasks assessed    Expression Expression Primary Mode of Expression: Verbal Verbal Expression Overall Verbal Expression: Appears within functional limits for tasks assessed   Oral / Motor Oral Motor/Sensory Function Overall Oral Motor/Sensory Function: Impaired Labial ROM: Within Functional Limits Labial Symmetry: Within Functional Limits Labial Strength: Within Functional Limits Labial Sensation: Reduced Lingual ROM: Within Functional Limits Lingual Symmetry: Within Functional Limits Lingual Strength: Within Functional Limits Lingual Sensation: Within Functional Limits Facial ROM: Within Functional Limits Facial Symmetry:  Within Functional Limits Facial Strength: Within Functional Limits Facial Sensation:  Reduced Velum: Within Functional Limits Mandible: Within Functional Limits Motor Speech Overall Motor Speech: Impaired Respiration: Impaired Level of Impairment: Sentence Resonance: Within functional limits Articulation: Impaired Intelligibility: Intelligibility reduced Word: 75-100% accurate Phrase: 75-100% accurate Sentence: 75-100% accurate Conversation: 75-100% accurate Motor Planning: Witnin functional limits Motor Speech Errors: Not applicable Effective Techniques: Slow rate;Increased vocal intensity;Over-articulate;Pause   GO     Pablo Lawrencereston, John Tarrell 08/20/2014, 4:19 PM  Angela NevinJohn T. Preston, MA, CCC-SLP 08/20/2014 4:20 PM

## 2014-08-20 NOTE — Progress Notes (Signed)
STROKE TEAM PROGRESS NOTE   HISTORY Jeffery Campbell is an 81 y.o. male history of hypertension and hyperlipidemia presenting with new onset weakness involving left face as well as extremities. Patient was last seen well at 9:30 PM 08/18/2014 when he went to bed. He fell when he attempted to get out of bed at about 1 AM. Is no previous history of stroke nor TIA. He's been taking aspirin 81 mg per day. CT scan of his head showed no acute intracranial abnormality. NIH stroke score was 5. Patient was not administered TPA secondary to Beyond time under for treatment consideration. He was admitted for further evaluation and treatment.   SUBJECTIVE (INTERVAL HISTORY) The patient's son is at the bedside. The patient continues to do well. Rehab planned. Still has left sided hemiparesis, but improving.   OBJECTIVE Temp:  [97.6 F (36.4 C)-98.7 F (37.1 C)] 98.7 F (37.1 C) (05/20 1450) Pulse Rate:  [56-65] 56 (05/20 1450) Cardiac Rhythm:  [-]  Resp:  [16-20] 18 (05/20 1450) BP: (145-173)/(62-82) 145/66 mmHg (05/20 1450) SpO2:  [91 %-96 %] 96 % (05/20 1450)   Recent Labs Lab 08/19/14 0248  GLUCAP 119*    Recent Labs Lab 08/19/14 0227 08/19/14 0230 08/19/14 0831  NA 139 142 138  K 3.5 3.4* 3.8  CL 108 106 108  CO2 21*  --  21*  GLUCOSE 136* 138* 127*  BUN 16 20 13   CREATININE 1.30* 1.20 1.05  CALCIUM 8.5*  --  8.6*    Recent Labs Lab 08/19/14 0227 08/19/14 0831  AST 20 18  ALT 17 17  ALKPHOS 51 47  BILITOT 1.0 1.3*  PROT 6.3* 6.1*  ALBUMIN 3.7 3.5    Recent Labs Lab 08/19/14 0227 08/19/14 0230 08/19/14 0831  WBC 6.1  --  9.1  NEUTROABS 4.0  --  7.5  HGB 16.2 17.7* 16.8  HCT 47.6 52.0 48.1  MCV 90.2  --  88.9  PLT 171  --  180   No results for input(s): CKTOTAL, CKMB, CKMBINDEX, TROPONINI in the last 168 hours.  Recent Labs  08/19/14 0227  LABPROT 13.8  INR 1.05    Recent Labs  08/19/14 0255  COLORURINE AMBER*  LABSPEC 1.015  PHURINE 7.0  GLUCOSEU  NEGATIVE  HGBUR TRACE*  BILIRUBINUR NEGATIVE  KETONESUR NEGATIVE  PROTEINUR 100*  UROBILINOGEN 1.0  NITRITE NEGATIVE  LEUKOCYTESUR NEGATIVE       Component Value Date/Time   CHOL 130 08/19/2014 0831   TRIG 40 08/19/2014 0831   HDL 48 08/19/2014 0831   CHOLHDL 2.7 08/19/2014 0831   VLDL 8 08/19/2014 0831   LDLCALC 74 08/19/2014 0831   Lab Results  Component Value Date   HGBA1C 5.6 08/19/2014      Component Value Date/Time   LABOPIA NONE DETECTED 08/19/2014 0255   COCAINSCRNUR NONE DETECTED 08/19/2014 0255   LABBENZ NONE DETECTED 08/19/2014 0255   AMPHETMU NONE DETECTED 08/19/2014 0255   THCU NONE DETECTED 08/19/2014 0255   LABBARB NONE DETECTED 08/19/2014 0255     Recent Labs Lab 08/19/14 0227  ETH <5    Dg Chest 2 View 08/19/2014   1.  Cardiomegaly.  No pulmonary venous congestion.  2. No focal pulmonary infiltrate .     Ct Head Wo Contrast 08/19/2014   1. No evidence of traumatic intracranial injury or fracture. 2. Scattered small vessel ischemic microangiopathy and cerebellar atrophy noted. 3. Mild mega cisterna magna noted.    MRI HEAD 08/19/2014   Acute 15 x  9 mm ischemia RIGHT lateral thalamus versus posterior limb of the internal capsule.  Otherwise normal MRI of the brain without contrast for age.    MRA HEAD 08/19/2014   Very mild stenosis LEFT P2 segment without large vessel occlusion or high-grade stenosis.     CUS - Bilateral: 1-39% ICA stenosis. Vertebral artery flow is antegrade.  2D echo  08/19/2014 Study Conclusions - Left ventricle: The cavity size was normal. Wall thickness was increased in a pattern of mild LVH. Systolic function was normal. The estimated ejection fraction was in the range of 55% to 60%. Doppler parameters are consistent with abnormal left ventricular relaxation (grade 1 diastolic dysfunction). - Aortic valve: There was mild regurgitation. - Mitral valve: There was mild regurgitation. - Pericardium, extracardiac: A  trivial pericardial effusion was identified.   PHYSICAL EXAM  Temp:  [97.6 F (36.4 C)-98.7 F (37.1 C)] 98.7 F (37.1 C) (05/20 1450) Pulse Rate:  [56-65] 56 (05/20 1450) Resp:  [16-20] 18 (05/20 1450) BP: (145-173)/(62-82) 145/66 mmHg (05/20 1450) SpO2:  [91 %-96 %] 96 % (05/20 1450)  General - Well nourished, well developed, in no apparent distress.  Ophthalmologic - Fundi not visualized due to small pupils.  Cardiovascular - Regular rate and rhythm with no murmur.  Mental Status -  Level of arousal and orientation to time, place, and person were intact. Language including expression, naming, repetition, comprehension was assessed and found intact.  Cranial Nerves II - XII - II - Visual field intact OU. III, IV, VI - Extraocular movements intact. V - Facial sensation intact bilaterally. VII - mild left facial droop. VIII - Hearing & vestibular intact bilaterally. X - Palate elevates symmetrically. XI - Chin turning & shoulder shrug intact bilaterally. XII - Tongue protrusion intact.  Motor Strength - The patient's strength was 4+/5 LUE and 4+/5 LLE proximal and 5/5 distal and pronator drift was present on the left. RUE and RLE 5/5. Bulk was normal and fasciculations were absent.   Motor Tone - Muscle tone was assessed at the neck and appendages and was normal.  Reflexes - The patient's reflexes were 1+ in all extremities and he had no pathological reflexes.  Sensory - Light touch, temperature/pinprick were assessed and were symmetrical.    Coordination - The patient had normal movements in the hands with no ataxia or dysmetria.  Tremor was absent.  Gait and Station - deferred due to safety concerns.   ASSESSMENT/PLAN Jeffery Campbell is a 81 y.o. male with history of hypertension and hyperlipidemia and probable chronic kidney disease  presenting with left sided weakness. He did not receive IV t-PA due to delay in arrival.   Stroke:  Non-dominant right AchA  territory infarct involving right PLIC, likely secondary to small vessel disease source. However, in this location infarct, 40% large vessel from ICA, 40% small vessel, 15% cardioembolic. Will do outpt 30 day cardiac event monitoring after d/c.  Resultant  Left side hemiparesis  MRI  Right AchA territory infarct involving right PLIC  MRA unremarkable  Carotid Doppler Bilateral: Unremarkable.  2D Echo  unremarkable  LDL 74  HgbA1c - 5.6  Lovenox 40 mg sq daily for VTE prophylaxis Diet Heart Room service appropriate?: Yes; Fluid consistency:: Thin  aspirin 81 mg orally every day prior to admission, now on aspirin 325 mg orally every day. Now on plavix 75 mg. QD  Patient counseled to be compliant with his antithrombotic medications  Ongoing aggressive stroke risk factor management  Recommend OP 30-d tele  monitoring to rule out possible atrial fibrillation (recent 48h holter monitor neg for atrial fibrillation)  Therapy recommendations:  CIR  Disposition:  pending  (grandson lives with patient, he currently works daily at the golf [employer] in [place], 2 sons involved in care)  Hypertension, accelerated  Home meds:   Cardizem, hytrin  Permissive hypertension (OK if < 220/120) but gradually normalize in 5-7 days  On diltiazem and terazosin now  BP unstable  Hyperlipidemia  Home meds:  lipitor 20, omega 3, lipitor resumed in hospital  LDL 74, goal < 70  Continue statin at discharge  Other Stroke Risk Factors  Advanced age  Obesity, Body mass index is 33.14 kg/(m^2).   Other Active Problems  Hx irregular HR - frequent PVCs - 30 day event recorder - reportedly cardiology has been notified.  Chronic kidney disease stage III  Hospital day # 1  RINEHULS, DAVID [facility] See Amion for Pager information 08/20/2014 3:54 PM   I, the attending vascular neurologist, have personally obtained a history, examined the patient, evaluated laboratory data,  individually viewed imaging studies and agree with radiology interpretations. Together with the NP/PA, we formulated the assessment and plan of care which reflects our mutual decision. I have made any additions or clarifications directly to the above note and agree with the findings and plan as currently documented.   81 yo M with PMH of HTN, HLD on home ASA 81 was admitted for right AchA territory infarct involving PLIC. Likely small vessel disease. CUS and 2D echo unremarkable, will recommend 30 day cardiac monitoring as outpt. Continue statin and change ASA to plavix for stroke prevention. PT/OT.   Neurology will sign off. Please call with questions. Pt will follow up with Dr. Roda Shutters at [facility] in about 2 months. Thanks for the consult.  Marvel Plan, MD PhD Stroke Neurology 08/20/2014 6:45 PM   To contact Stroke Continuity provider, please refer to [email or web address]. After hours, contact General Neurology

## 2014-08-20 NOTE — Evaluation (Signed)
Physical Therapy Evaluation Patient Details Name: Jeffery Campbell MRN: [id number] DOB: 1929/01/04 Today'Campbell Date: 08/20/2014   History of Present Illness  81 y.o. male admitted for left sided weakness. MRI reveals Acute ischemia RIGHT lateral thalamus versus posterior limb of the internal capsule.  Clinical Impression  Pt admitted with the above complications. Pt currently with functional limitations due to the deficits listed below (see PT Problem List). 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 Pt now requires min assist for transfers and ambulation, demonstrating poor LLE control during functional tasks. Very motivated individual with strong family support. Would benefit from CIR screen as I feel he would progress well with his functional independence in this setting. Pt will benefit from skilled PT to increase their independence and safety with mobility to allow discharge to the venue listed below.       Follow Up Recommendations CIR    Equipment Recommendations   (TBD by next venue of care.)    Recommendations for Other Services Rehab consult;OT consult;Speech consult     Precautions / Restrictions Precautions Precautions: Fall Restrictions Weight Bearing Restrictions: No      Mobility  Bed Mobility Overal bed mobility: Needs Assistance Bed Mobility: Rolling;Sidelying to Sit Rolling: Min guard Sidelying to sit: Min guard;HOB elevated       General bed mobility comments: Min guard for safety. VC for technique, requires extra time.  Transfers Overall transfer level: Needs assistance Equipment used: Rolling walker (2 wheeled) Transfers: Sit to/from Stand Sit to Stand: Min assist         General transfer comment: min assist for boost to stand. No buckling noted of LLE. Cues for positioning of LLE. Leans to Left upon standing. Slow to rise.  Ambulation/Gait Ambulation/Gait assistance: Min assist Ambulation Distance (Feet): 45 Feet Assistive device: Rolling  walker (2 wheeled) Gait Pattern/deviations: Step-to pattern;Decreased step length - right;Decreased stance time - left;Decreased dorsiflexion - left;Decreased weight shift to right;Ataxic;Trunk flexed;Wide base of support (Leaning left) Gait velocity: decreased Gait velocity interpretation: Below normal speed for age/gender General Gait Details: Educated on safe DME use with a rolling walker. Ataxia of LLE requiring tactile cues for foot placement, VC for upright posture, and facilitory techniques for Lt hip extension in Lt stance phase. No buckling noted however assist required to block left lateral lean. Poor Lt foot clearance in swing phase.  Stairs            Wheelchair Mobility    Modified Rankin (Stroke Patients Only) Modified Rankin (Stroke Patients Only) Pre-Morbid Rankin Score: No symptoms Modified Rankin: Moderately severe disability     Balance Overall balance assessment: Needs assistance;History of Falls Sitting-balance support: No upper extremity supported;Feet supported Sitting balance-Leahy Scale: Fair Sitting balance - Comments: Leaning left, able to self correct with effort and cues Postural control: Left lateral lean Standing balance support: Bilateral upper extremity supported Standing balance-Leahy Scale: Poor                               Pertinent Vitals/Pain Pain Assessment: No/denies pain    Home Living Family/patient expects to be discharged to:: Inpatient rehab Living Arrangements: Other (Comment);Other relatives (grandson works part time) Available Help at Discharge: Family;Available PRN/intermittently (grandson works part time) Type of Home: House Home Access: Level entry     Home Layout: One level Home Equipment: None Additional Comments: Works at a golf-course    Prior Function Level of Independence: Independent  Hand Dominance   Dominant Hand:  (both - uses left more due to injury on Rt hand)     Extremity/Trunk Assessment   Upper Extremity Assessment: Defer to OT evaluation           Lower Extremity Assessment: LLE deficits/detail   LLE Deficits / Details: 5/5 MMT RLE ---  4/5 left hip flexion, left knee extension. Abnormal Lt heel to shin test. Poor ankle proprioception/kinesthetic awareness. Normal light touch sensation BIL LEs.     Communication   Communication: No difficulties  Cognition Arousal/Alertness: Awake/alert Behavior During Therapy: WFL for tasks assessed/performed Overall Cognitive Status: Within Functional Limits for tasks assessed                      General Comments General comments (skin integrity, edema, etc.): dysphasia noted - son also reports pt'Campbell speaking ability altered from baseline    Exercises General Exercises - Lower Extremity Ankle Circles/Pumps: AROM;Both;10 reps;Seated Long Arc Quad: AROM;Left;10 reps;Seated ("slow and controlled") Heel Slides: AROM;Left;5 reps;Seated Hip Flexion/Marching: AROM;Left;5 reps;Seated      Assessment/Plan    PT Assessment Patient needs continued PT services  PT Diagnosis Difficulty walking;Abnormality of gait;Hemiplegia non-dominant side   PT Problem List Decreased strength;Decreased activity tolerance;Decreased balance;Decreased mobility;Decreased coordination;Decreased knowledge of use of DME;Impaired sensation;Obesity  PT Treatment Interventions DME instruction;Gait training;Functional mobility training;Therapeutic activities;Therapeutic exercise;Balance training;Neuromuscular re-education;Patient/family education;Modalities   PT Goals (Current goals can be found in the Care Plan section) Acute Rehab PT Goals Patient Stated Goal: Go to rehab PT Goal Formulation: With patient Time For Goal Achievement: 09/03/14 Potential to Achieve Goals: Good    Frequency Min 4X/week   Barriers to discharge Decreased caregiver support Lives with grandson who works part-time.     Co-evaluation                End of Session Equipment Utilized During Treatment: Gait belt Activity Tolerance: Patient tolerated treatment well Patient left: in chair;with call bell/phone within reach;with chair alarm set;with family/visitor present Nurse Communication: Mobility status         Time: [id number] PT Time Calculation (min) (ACUTE ONLY): 27 min   Charges:   PT Evaluation $Initial PT Evaluation Tier I: 1 Procedure PT Treatments $Gait Training: 8-22 mins   PT G CodesBerton Mount:        Jeffery Campbell 08/20/2014, 11:18 AM Charlsie MerlesLogan Secor Makenli Derstine, PT [phone]

## 2014-08-20 NOTE — Progress Notes (Signed)
PROGRESS NOTE  Jeffery Campbell ZOX:[id number] DOB: June 22, 1928 DOA: 08/19/2014 PCP: Dennison MascotMORRISEY,LEMONT, MD  Assessment/Plan: Stroke with left-sided hemiparesis -  -neurology consult: recommending 30 day tele monitoring to r/o a fib- arranged with cardiology -MRI/MRA brain + 2-D echo: Study Conclusions - Left ventricle: The cavity size was normal. Wall thickness was increased in a pattern of mild LVH. Systolic function was normal. The estimated ejection fraction was in the range of 55% to 60%. Doppler parameters are consistent with abnormal left ventricular relaxation (grade 1 diastolic dysfunction). - Aortic valve: There was mild regurgitation. - Mitral valve: There was mild regurgitation. - Pericardium, extracardiac: A trivial pericardial effusion was identified. carotid Dopplers : Bilateral: 1-39% ICA stenosis. Vertebral artery flow is antegrade physical therapy consult CIR -hemoglobin A1c  -lipid panel.  Hypertension - continue present medication and we will allow for permissive hypertension secondary to stroke.  Hyperlipidemia continue statins.  AKI -resolved with IVF  Code Status: full Family Communication: patient/son Disposition Plan:    Consultants:  neuro  Procedures:      HPI/Subjective: Doing well today- no SOB, no CP  Objective: Filed Vitals:   08/20/14 0939  BP: 153/71  Pulse: 58  Temp: 97.7 F (36.5 C)  Resp: 18   No intake or output data in the 24 hours ending 08/20/14 1132 Filed Weights   08/19/14 0250 08/19/14 0416  Weight: 111.358 kg (245 lb 8 oz) 113.9 kg (251 lb 1.7 oz)    Exam:   General:  Pleasant/cooperative  Cardiovascular: rrr  Respiratory: clear  Abdomen: +BS, soft  Musculoskeletal: mildly weaker on left side   Data Reviewed: Basic Metabolic Panel:  Recent Labs Lab 08/19/14 0227 08/19/14 0230 08/19/14 0831  NA 139 142 138  K 3.5 3.4* 3.8  CL 108 106 108  CO2 21*  --  21*  GLUCOSE 136* 138* 127*  BUN  16 20 13   CREATININE 1.30* 1.20 1.05  CALCIUM 8.5*  --  8.6*   Liver Function Tests:  Recent Labs Lab 08/19/14 0227 08/19/14 0831  AST 20 18  ALT 17 17  ALKPHOS 51 47  BILITOT 1.0 1.3*  PROT 6.3* 6.1*  ALBUMIN 3.7 3.5   No results for input(s): LIPASE, AMYLASE in the last 168 hours. No results for input(s): AMMONIA in the last 168 hours. CBC:  Recent Labs Lab 08/19/14 0227 08/19/14 0230 08/19/14 0831  WBC 6.1  --  9.1  NEUTROABS 4.0  --  7.5  HGB 16.2 17.7* 16.8  HCT 47.6 52.0 48.1  MCV 90.2  --  88.9  PLT 171  --  180   Cardiac Enzymes: No results for input(s): CKTOTAL, CKMB, CKMBINDEX, TROPONINI in the last 168 hours. BNP (last 3 results) No results for input(s): BNP in the last 8760 hours.  ProBNP (last 3 results) No results for input(s): PROBNP in the last 8760 hours.  CBG:  Recent Labs Lab 08/19/14 0248  GLUCAP 119*    No results found for this or any previous visit (from the past 240 hour(s)).   Studies: Dg Chest 2 View  08/19/2014   CLINICAL DATA:  Stroke.  EXAM: CHEST  2 VIEW  COMPARISON:  None.  FINDINGS: Mediastinum and hilar structures are normal. The lungs are clear of acute infiltrates. Cardiomegaly with normal pulmonary vascularity. No pleural effusion. Bilateral apical mild pleural thickening is noted. No definite pneumothorax. Lung markings are noted distal to the apical pleural thickening . Degenerative changes thoracic spine.  IMPRESSION: 1.  Cardiomegaly.  No pulmonary venous  congestion.  2. No focal pulmonary infiltrate .   Electronically Signed   By: Maisie Fushomas  Register   On: 08/19/2014 07:53   Ct Head Wo Contrast  08/19/2014   CLINICAL DATA:  Code stroke. Left-sided weakness and facial droop. Status post fall. Initial encounter.  EXAM: CT HEAD WITHOUT CONTRAST  TECHNIQUE: Contiguous axial images were obtained from the base of the skull through the vertex without intravenous contrast.  COMPARISON:  None.  FINDINGS: There is no evidence of  acute infarction, mass lesion, or intra- or extra-axial hemorrhage on CT.  Mild mega cisterna magna is noted. Cerebellar atrophy is noted. Scattered periventricular and subcortical white matter change likely reflects small vessel ischemic microangiopathy.  The brainstem and fourth ventricle are within normal limits. The basal ganglia are unremarkable in appearance. The cerebral hemispheres demonstrate grossly normal gray-white differentiation. No mass effect or midline shift is seen.  There is no evidence of fracture; visualized osseous structures are unremarkable in appearance. The orbits are within normal limits. The paranasal sinuses and mastoid air cells are well-aerated. No significant soft tissue abnormalities are seen.  IMPRESSION: 1. No evidence of traumatic intracranial injury or fracture. 2. Scattered small vessel ischemic microangiopathy and cerebellar atrophy noted. 3. Mild mega cisterna magna noted.  These results were called by telephone at the time of interpretation on 08/19/2014 at 2:37 am to Dr. Roseanne RenoStewart, who verbally acknowledged these results.   Electronically Signed   By: Roanna RaiderJeffery  Chang M.D.   On: 08/19/2014 02:37   Mr Brain Wo Contrast  08/19/2014   CLINICAL DATA:  Awoke at 1 a.m. with LEFT-sided weakness, fell while walking to bathroom. History of hypertension, chronic kidney disease, hypercholesterolemia.  EXAM: MRI HEAD WITHOUT CONTRAST  MRA HEAD WITHOUT CONTRAST  TECHNIQUE: Multiplanar, multiecho pulse sequences of the brain and surrounding structures were obtained without intravenous contrast. Angiographic images of the head were obtained using MRA technique without contrast.  COMPARISON:  CT of the head Aug 19, 2014 at 2:34 a.m.  FINDINGS: MRI HEAD FINDINGS  Faint 15 x 9 mm area of reduced diffusion within RIGHT lateral thalamus versus posterior limb of the internal capsule with corresponding low ADC values. No susceptibility artifact to suggest hemorrhage. The ventricles and sulci are  normal for patient's age. Minimal white matter changes are less than expected for age, suggesting chronic small vessel ischemic disease.  No abnormal extra-axial fluid collections. Small posterior posterior fossa arachnoid cyst. Ocular globes and orbital contents are unremarkable. Mild LEFT maxillary sinus mucosal thickening without air-fluid levels. The mastoid air cells appear well-aerated. Mild RIGHT temporomandibular osteoarthrosis. No abnormal sellar expansion. No cerebellar tonsillar ectopia. No suspicious calvarial bone marrow signal.  MRA HEAD FINDINGS  Anterior circulation: Normal flow related enhancement of the included cervical, petrous, cavernous and supra clinoid internal carotid arteries. Patent anterior communicating artery. Normal flow related enhancement of the anterior and middle cerebral arteries, including more distal segments.  No large vessel occlusion, high-grade stenosis, abnormal luminal irregularity, aneurysm.  Posterior circulation: LEFT vertebral artery is dominant. Basilar artery is patent, with normal flow related enhancement of the main branch vessels. Fetal origin LEFT posterior cerebral artery. Normal flow related enhancement of the posterior cerebral arteries. Very mild stenosis LEFT P2 segment.  No large vessel occlusion, high-grade stenosis, abnormal luminal irregularity, aneurysm.  IMPRESSION: MRI HEAD: Acute 15 x 9 mm ischemia RIGHT lateral thalamus versus posterior limb of the internal capsule.  Otherwise normal MRI of the brain without contrast for age.  MRA HEAD: Very mild  stenosis LEFT P2 segment without large vessel occlusion or high-grade stenosis.   Electronically Signed   By: Awilda Metro   On: 08/19/2014 06:38   Mr Maxine Glenn Head/brain Wo Cm  08/19/2014   CLINICAL DATA:  Awoke at 1 a.m. with LEFT-sided weakness, fell while walking to bathroom. History of hypertension, chronic kidney disease, hypercholesterolemia.  EXAM: MRI HEAD WITHOUT CONTRAST  MRA HEAD WITHOUT  CONTRAST  TECHNIQUE: Multiplanar, multiecho pulse sequences of the brain and surrounding structures were obtained without intravenous contrast. Angiographic images of the head were obtained using MRA technique without contrast.  COMPARISON:  CT of the head Aug 19, 2014 at 2:34 a.m.  FINDINGS: MRI HEAD FINDINGS  Faint 15 x 9 mm area of reduced diffusion within RIGHT lateral thalamus versus posterior limb of the internal capsule with corresponding low ADC values. No susceptibility artifact to suggest hemorrhage. The ventricles and sulci are normal for patient's age. Minimal white matter changes are less than expected for age, suggesting chronic small vessel ischemic disease.  No abnormal extra-axial fluid collections. Small posterior posterior fossa arachnoid cyst. Ocular globes and orbital contents are unremarkable. Mild LEFT maxillary sinus mucosal thickening without air-fluid levels. The mastoid air cells appear well-aerated. Mild RIGHT temporomandibular osteoarthrosis. No abnormal sellar expansion. No cerebellar tonsillar ectopia. No suspicious calvarial bone marrow signal.  MRA HEAD FINDINGS  Anterior circulation: Normal flow related enhancement of the included cervical, petrous, cavernous and supra clinoid internal carotid arteries. Patent anterior communicating artery. Normal flow related enhancement of the anterior and middle cerebral arteries, including more distal segments.  No large vessel occlusion, high-grade stenosis, abnormal luminal irregularity, aneurysm.  Posterior circulation: LEFT vertebral artery is dominant. Basilar artery is patent, with normal flow related enhancement of the main branch vessels. Fetal origin LEFT posterior cerebral artery. Normal flow related enhancement of the posterior cerebral arteries. Very mild stenosis LEFT P2 segment.  No large vessel occlusion, high-grade stenosis, abnormal luminal irregularity, aneurysm.  IMPRESSION: MRI HEAD: Acute 15 x 9 mm ischemia RIGHT lateral  thalamus versus posterior limb of the internal capsule.  Otherwise normal MRI of the brain without contrast for age.  MRA HEAD: Very mild stenosis LEFT P2 segment without large vessel occlusion or high-grade stenosis.   Electronically Signed   By: Awilda Metro   On: 08/19/2014 06:38    Scheduled Meds: .  stroke: mapping our early stages of recovery book   Does not apply Once  . atorvastatin  20 mg Oral Daily  . citalopram  10 mg Oral Daily  . clopidogrel  75 mg Oral Daily  . cyclobenzaprine  5 mg Oral QHS  . diltiazem  120 mg Oral Daily  . enoxaparin (LOVENOX) injection  40 mg Subcutaneous Q24H  . omega-3 acid ethyl esters  1 g Oral Daily  . pantoprazole  40 mg Oral Daily  . terazosin  5 mg Oral QHS  . vitamin B-12  2,000 mcg Oral Daily   Continuous Infusions:  Antibiotics Given (last 72 hours)    None      Principal Problem:   Stroke Active Problems:   Hypertension   HLD (hyperlipidemia)   CKD (chronic kidney disease) stage 3, GFR 30-59 ml/min   Accelerated hypertension   Cerebral infarction due to thrombosis of right carotid artery   Obesity    Time spent: 25 min    Charles Niese  [facility] Pager [phone]. If 7PM-7AM, please contact night-coverage at [email or web address], password [email or web address] 08/20/2014, 11:32 AM  LOS: 1 day

## 2014-08-20 NOTE — Consult Note (Signed)
Physical Medicine and Rehabilitation Consult  Reason for Consult: Left sided weakness and slurred speech.  Referring Physician:  Dr. Benjamine MolaVann   HPI: Jeffery Campbell is a 81 y.o. male with history of HTN, OA, bradycardia, anxiety d/o;  who was admitted on 08/19/14 with left sided weakness and fall with inability to walk. CT head without acute changes. MRI/MRA brain done revealing acute ischemia right lateral thalamus v/s posterior limb of internal capsule. Carotid dopplers without significant ICA stenosis. 2D echo with EF 55-62%, no wall abnormality and grade I diastolic dysfunction. Dr. Roda ShuttersXu evaluated patient and recommends changing chronic ASA to plavix for stroke likely due to SVD. EKG with frequent PVCs but negative for A fib and outpatient cardiac monitoring recommended pending full work up. Therapy evaluations being done today and CIR recommended for follow up therapy.     Review of Systems  HENT: Negative for hearing loss.   Eyes: Negative for blurred vision and double vision.  Respiratory: Negative for cough, shortness of breath and wheezing.   Cardiovascular: Negative for chest pain and palpitations.  Gastrointestinal: Negative for abdominal pain and constipation.  Genitourinary: Negative for urgency and frequency.  Musculoskeletal: Negative for back pain and joint pain.  Neurological: Positive for sensory change, speech change and focal weakness. Negative for dizziness and headaches.      Past Medical History  Diagnosis Date  . Diverticulitis   . Hypertension   . Pure hypercholesterolemia   . Hemorrhoids   . OA (osteoarthritis)   . Disturbance of sleep     Past Surgical History  Procedure Laterality Date  . Transurethral resection of prostate    . Knee arthroscopy    . Colonoscopy      Family History  Problem Relation Age of Onset  . Hypertension Son     Social History:    Lives with grandson.  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 He reports that he has never smoked. He does not have any smokeless tobacco history on file. He reports that he does not drink alcohol or use illicit drugs.   Allergies: No Known Allergies    Medications Prior to Admission  Medication Sig Dispense Refill  . aspirin 81 MG tablet Take 81 mg by mouth daily.    [name]. atorvastatin (LIPITOR) 20 MG tablet Take 20 mg by mouth daily.    . citalopram (CELEXA) 10 MG tablet Take 10 mg by mouth daily.    . cyclobenzaprine (FLEXERIL) 5 MG tablet Take 5 mg by mouth at bedtime.    . diazepam (VALIUM) 5 MG tablet Take 5 mg by mouth every 8 (eight) hours as needed for anxiety.    [name]. diltiazem (CARDIZEM CD) 120 MG 24 hr capsule Take 1 capsule (120 mg total) by mouth daily. 90 capsule 3  . lansoprazole (PREVACID) 30 MG capsule Take 30 mg by mouth daily at 12 noon.    . meloxicam (MOBIC) 15 MG tablet Take 15 mg by mouth daily.  2  . Omega 3 1200 MG CAPS Take by mouth daily.    [name]. terazosin (HYTRIN) 5 MG capsule Take 5 mg by mouth at bedtime.    . vitamin B-12 (CYANOCOBALAMIN) 1000 MCG tablet Take 2,000 mcg by mouth daily.       Home: Home Living Family/patient expects to be discharged to:: Inpatient rehab Living Arrangements: Other (Comment), Other relatives (grandson works part time) Available Help at Discharge: Family, Available PRN/intermittently (grandson  works part time) Type of Home: House Home Access: Level entry Home Layout: One level Home Equipment: None Additional Comments: Works at a [employer]  Functional History: Prior Function Level of Independence: Independent Functional Status:  Mobility: Bed Mobility Overal bed mobility: Needs Assistance Bed Mobility: Rolling, Sidelying to Sit Rolling: Min guard Sidelying to sit: Min guard, HOB elevated General bed mobility comments: Min guard for safety. VC for technique, requires extra time. Transfers Overall transfer level: Needs assistance Equipment used: Rolling walker (2  wheeled) Transfers: Sit to/from Stand Sit to Stand: Min assist General transfer comment: min assist for boost to stand. No buckling noted of LLE. Cues for positioning of LLE. Leans to Left upon standing. Slow to rise. Ambulation/Gait Ambulation/Gait assistance: Min assist Ambulation Distance (Feet): 45 Feet Assistive device: Rolling walker (2 wheeled) General Gait Details: Educated on safe DME use with a rolling walker. Ataxia of LLE requiring tactile cues for foot placement, VC for upright posture, and facilitory techniques for Lt hip extension in Lt stance phase. No buckling noted however assist required to block left lateral lean. Poor Lt foot clearance in swing phase. Gait Pattern/deviations: Step-to pattern, Decreased step length - right, Decreased stance time - left, Decreased dorsiflexion - left, Decreased weight shift to right, Ataxic, Trunk flexed, Wide base of support (Leaning left) Gait velocity: decreased Gait velocity interpretation: Below normal speed for age/gender    ADL:    Cognition: Cognition Overall Cognitive Status: Within Functional Limits for tasks assessed Orientation Level: Oriented X4 Cognition Arousal/Alertness: Awake/alert Behavior During Therapy: WFL for tasks assessed/performed Overall Cognitive Status: Within Functional Limits for tasks assessed   Blood pressure 153/71, pulse 58, temperature 97.7 F (36.5 C), temperature source Oral, resp. rate 18, height 6\' 1"  (1.854 m), weight 113.9 kg (251 lb 1.7 oz), SpO2 93 %. Physical Exam  Nursing note and vitals reviewed. Constitutional: He is oriented to person, place, and time. He appears well-developed and well-nourished.  HENT:  Head: Normocephalic and atraumatic.  Eyes: Conjunctivae are normal. Pupils are equal, round, and reactive to light.  Neck: Normal range of motion. Neck supple.  Cardiovascular: Normal rate and regular rhythm.   Respiratory: Effort normal and breath sounds normal. No respiratory  distress. He has no wheezes.  GI: Soft. Bowel sounds are normal. He exhibits no distension. There is no tenderness.  Musculoskeletal: He exhibits no edema or tenderness.  Neurological: He is alert and oriented to person, place, and time. He has normal reflexes.  Left facial weakness with  Mild dysarthria.  Follows one and two step commands without difficulty  LUE and LLE proprioceptive deficits. . Left upper and left lower limb ataxia with fine movements.. Motor: 4+/5 left deltoid, bicep, tricep, hi, hip, knee, ankle.   Skin: Skin is warm and dry.  Psychiatric: He has a normal mood and affect. His behavior is normal. Judgment and thought content normal.    No results found for this or any previous visit (from the past 24 hour(s)). Dg Chest 2 View  08/19/2014   CLINICAL DATA:  Stroke.  EXAM: CHEST  2 VIEW  COMPARISON:  None.  FINDINGS: Mediastinum and hilar structures are normal. The lungs are clear of acute infiltrates. Cardiomegaly with normal pulmonary vascularity. No pleural effusion. Bilateral apical mild pleural thickening is noted. No definite pneumothorax. Lung markings are noted distal to the apical pleural thickening . Degenerative changes thoracic spine.  IMPRESSION: 1.  Cardiomegaly.  No pulmonary venous congestion.  2. No focal pulmonary infiltrate .   Electronically  Signed   ByMaisie Fus  Register   On: 08/19/2014 07:53   Ct Head Wo Contrast  08/19/2014   CLINICAL DATA:  Code stroke. Left-sided weakness and facial droop. Status post fall. Initial encounter.  EXAM: CT HEAD WITHOUT CONTRAST  TECHNIQUE: Contiguous axial images were obtained from the base of the skull through the vertex without intravenous contrast.  COMPARISON:  None.  FINDINGS: There is no evidence of acute infarction, mass lesion, or intra- or extra-axial hemorrhage on CT.  Mild mega cisterna magna is noted. Cerebellar atrophy is noted. Scattered periventricular and subcortical white matter change likely reflects small vessel  ischemic microangiopathy.  The brainstem and fourth ventricle are within normal limits. The basal ganglia are unremarkable in appearance. The cerebral hemispheres demonstrate grossly normal gray-white differentiation. No mass effect or midline shift is seen.  There is no evidence of fracture; visualized osseous structures are unremarkable in appearance. The orbits are within normal limits. The paranasal sinuses and mastoid air cells are well-aerated. No significant soft tissue abnormalities are seen.  IMPRESSION: 1. No evidence of traumatic intracranial injury or fracture. 2. Scattered small vessel ischemic microangiopathy and cerebellar atrophy noted. 3. Mild mega cisterna magna noted.  These results were called by telephone at the time of interpretation on 08/19/2014 at 2:37 am to Dr. Roseanne Reno, who verbally acknowledged these results.   Electronically Signed   By: Roanna Raider M.D.   On: 08/19/2014 02:37   Mr Brain Wo Contrast  08/19/2014   CLINICAL DATA:  Awoke at 1 a.m. with LEFT-sided weakness, fell while walking to bathroom. History of hypertension, chronic kidney disease, hypercholesterolemia.  EXAM: MRI HEAD WITHOUT CONTRAST  MRA HEAD WITHOUT CONTRAST  TECHNIQUE: Multiplanar, multiecho pulse sequences of the brain and surrounding structures were obtained without intravenous contrast. Angiographic images of the head were obtained using MRA technique without contrast.  COMPARISON:  CT of the head Aug 19, 2014 at 2:34 a.m.  FINDINGS: MRI HEAD FINDINGS  Faint 15 x 9 mm area of reduced diffusion within RIGHT lateral thalamus versus posterior limb of the internal capsule with corresponding low ADC values. No susceptibility artifact to suggest hemorrhage. The ventricles and sulci are normal for patient's age. Minimal white matter changes are less than expected for age, suggesting chronic small vessel ischemic disease.  No abnormal extra-axial fluid collections. Small posterior posterior fossa arachnoid cyst.  Ocular globes and orbital contents are unremarkable. Mild LEFT maxillary sinus mucosal thickening without air-fluid levels. The mastoid air cells appear well-aerated. Mild RIGHT temporomandibular osteoarthrosis. No abnormal sellar expansion. No cerebellar tonsillar ectopia. No suspicious calvarial bone marrow signal.  MRA HEAD FINDINGS  Anterior circulation: Normal flow related enhancement of the included cervical, petrous, cavernous and supra clinoid internal carotid arteries. Patent anterior communicating artery. Normal flow related enhancement of the anterior and middle cerebral arteries, including more distal segments.  No large vessel occlusion, high-grade stenosis, abnormal luminal irregularity, aneurysm.  Posterior circulation: LEFT vertebral artery is dominant. Basilar artery is patent, with normal flow related enhancement of the main branch vessels. Fetal origin LEFT posterior cerebral artery. Normal flow related enhancement of the posterior cerebral arteries. Very mild stenosis LEFT P2 segment.  No large vessel occlusion, high-grade stenosis, abnormal luminal irregularity, aneurysm.  IMPRESSION: MRI HEAD: Acute 15 x 9 mm ischemia RIGHT lateral thalamus versus posterior limb of the internal capsule.  Otherwise normal MRI of the brain without contrast for age.  MRA HEAD: Very mild stenosis LEFT P2 segment without large vessel occlusion or high-grade stenosis.  Electronically Signed   By: Awilda Metro   On: 08/19/2014 06:38    Assessment/Plan: Diagnosis: right thalamic/PLIC infarct 1. Does the need for close, 24 hr/day medical supervision in concert with the patient's rehab needs make it unreasonable for this patient to be served in a less intensive setting? Yes 2. Co-Morbidities requiring supervision/potential complications: htn, obesity, ckd 3. Due to bladder management, bowel management, safety, skin/wound care, disease management, medication administration, pain management and patient  education, does the patient require 24 hr/day rehab nursing? Yes 4. Does the patient require coordinated care of a physician, rehab nurse, PT (1-2 hrs/day, 5 days/week) and OT (1-2 hrs/day, 5 days/week) to address physical and functional deficits in the context of the above medical diagnosis(es)? Yes Addressing deficits in the following areas: balance, endurance, locomotion, strength, transferring, bowel/bladder control, bathing, dressing, feeding, grooming and toileting 5. Can the patient actively participate in an intensive therapy program of at least 3 hrs of therapy per day at least 5 days per week? Yes 6. The potential for patient to make measurable gains while on inpatient rehab is excellent 7. Anticipated functional outcomes upon discharge from inpatient rehab are modified independent  with PT, modified independent with OT, n/a with SLP. 8. Estimated rehab length of stay to reach the above functional goals is: 7-10 days 9. Does the patient have adequate social supports and living environment to accommodate these discharge functional goals? Yes 10. Anticipated D/C setting: Home 11. Anticipated post D/C treatments: HH therapy and Outpatient therapy 12. Overall Rehab/Functional Prognosis: excellent  RECOMMENDATIONS: This patient's condition is appropriate for continued rehabilitative care in the following setting: CIR Patient has agreed to participate in recommended program. Yes Note that insurance prior authorization may be required for reimbursement for recommended care.  Comment: Pt quite motivated; independent and active PTA. Rehab Admissions Coordinator to follow up.  Thanks,  Ranelle Oyster, MD, [place] [facility]     08/20/2014

## 2014-08-21 NOTE — Progress Notes (Signed)
PROGRESS NOTE  Jeffery BrittleJoe Beeghly ZOX:[id number] DOB: [birth year] DOA: 08/19/2014 PCP: Dennison MascotMORRISEY,LEMONT, MD  Assessment/Plan: Stroke with left-sided hemiparesis -  -neurology consult: recommending 30 day tele monitoring to r/o a fib- arranged with cardiology -MRI/MRA brain + 2-D echo: Study Conclusions - Left ventricle: The cavity size was normal. Wall thickness was increased in a pattern of mild LVH. Systolic function was normal. The estimated ejection fraction was in the range of 55% to 60%. Doppler parameters are consistent with abnormal left ventricular relaxation (grade 1 diastolic dysfunction). - Aortic valve: There was mild regurgitation. - Mitral valve: There was mild regurgitation. - Pericardium, extracardiac: A trivial pericardial effusion was identified. carotid Dopplers : Bilateral: 1-39% ICA stenosis. Vertebral artery flow is antegrade physical therapy consult CIR -hemoglobin A1c 5.6 -LDL: 74 lipitor  Hypertension - continue present medication   Hyperlipidemia: statin  AKI -resolved with IVF  Code Status: full Family Communication: patient/son Disposition Plan:    Consultants:  neuro  Procedures:      HPI/Subjective: No complaints- doing exercises in bed  Objective: Filed Vitals:   08/21/14 0921  BP: 117/59  Pulse: 59  Temp: 98.1 F (36.7 C)  Resp: 18   No intake or output data in the 24 hours ending 08/21/14 1150 Filed Weights   08/19/14 0250 08/19/14 0416  Weight: 111.358 kg (245 lb 8 oz) 113.9 kg (251 lb 1.7 oz)    Exam:   General:  Pleasant/cooperative  Cardiovascular: rrr  Respiratory: clear  Abdomen: +BS, soft  Musculoskeletal: no edema  Data Reviewed: Basic Metabolic Panel:  Recent Labs Lab 08/19/14 0227 08/19/14 0230 08/19/14 0831  NA 139 142 138  K 3.5 3.4* 3.8  CL 108 106 108  CO2 21*  --  21*  GLUCOSE 136* 138* 127*  BUN 16 20 13   CREATININE 1.30* 1.20 1.05  CALCIUM 8.5*  --  8.6*   Liver Function  Tests:  Recent Labs Lab 08/19/14 0227 08/19/14 0831  AST 20 18  ALT 17 17  ALKPHOS 51 47  BILITOT 1.0 1.3*  PROT 6.3* 6.1*  ALBUMIN 3.7 3.5   No results for input(s): LIPASE, AMYLASE in the last 168 hours. No results for input(s): AMMONIA in the last 168 hours. CBC:  Recent Labs Lab 08/19/14 0227 08/19/14 0230 08/19/14 0831  WBC 6.1  --  9.1  NEUTROABS 4.0  --  7.5  HGB 16.2 17.7* 16.8  HCT 47.6 52.0 48.1  MCV 90.2  --  88.9  PLT 171  --  180   Cardiac Enzymes: No results for input(s): CKTOTAL, CKMB, CKMBINDEX, TROPONINI in the last 168 hours. BNP (last 3 results) No results for input(s): BNP in the last 8760 hours.  ProBNP (last 3 results) No results for input(s): PROBNP in the last 8760 hours.  CBG:  Recent Labs Lab 08/19/14 0248  GLUCAP 119*    No results found for this or any previous visit (from the past 240 hour(s)).   Studies: No results found.  Scheduled Meds: .  stroke: mapping our early stages of recovery book   Does not apply Once  . atorvastatin  20 mg Oral Daily  . citalopram  10 mg Oral Daily  . clopidogrel  75 mg Oral Daily  . cyclobenzaprine  5 mg Oral QHS  . diltiazem  120 mg Oral Daily  . enoxaparin (LOVENOX) injection  40 mg Subcutaneous Q24H  . omega-3 acid ethyl esters  1 g Oral Daily  . pantoprazole  40 mg Oral Daily  .  terazosin  5 mg Oral QHS  . vitamin B-12  2,000 mcg Oral Daily   Continuous Infusions:  Antibiotics Given (last 72 hours)    None      Principal Problem:   Stroke Active Problems:   Hypertension   HLD (hyperlipidemia)   CKD (chronic kidney disease) stage 3, GFR 30-59 ml/min   Accelerated hypertension   Cerebral infarction due to thrombosis of right carotid artery   Obesity    Time spent: 25 min    Seraphine Gudiel  [facility] Pager [phone]. If 7PM-7AM, please contact night-coverage at [email or web address], password [facility] 08/21/2014, 11:50 AM  LOS: 2 days

## 2014-08-21 NOTE — Social Work (Signed)
CSW met with patient in order to assess potential for SNF placement as a possible discharge when medically stable. Patient and son identified that they felt confident in CIR as the current discharge plan and will stay with that. Patient and son also agreed to taking list of facilities in case they desire to reconsider.   No further CSW needs at this time.  CSW signing off.  Christene Lye MSW, [place]

## 2014-08-21 NOTE — Evaluation (Signed)
Occupational Therapy Evaluation Patient Details Name: Jayziah Bankhead MRN: [id number] DOB: [birth year] Today's Date: 08/21/2014    History of Present Illness 81 y.o. male admitted for left sided weakness. MRI reveals Acute ischemia RIGHT lateral thalamus versus posterior limb of the internal capsule.   Clinical Impression   PTA pt lived at home and was Independent with ADLs. Pt is currently limited by LUE decreased coordination and balance deficits due to ataxia. Pt requires heavy min A for balance during ambulation and assist for LB ADLs. Encouraged use of LUE in functional tasks. Pt is an excellent candidate for CIR and acute OT to progress to Supervision/Mod I level to return home with family support.     Follow Up Recommendations  CIR;Supervision/Assistance - 24 hour    Equipment Recommendations  None recommended by OT    Recommendations for Other Services       Precautions / Restrictions Precautions Precautions: Fall Restrictions Weight Bearing Restrictions: No      Mobility Bed Mobility Overal bed mobility: Needs Assistance Bed Mobility: Supine to Sit     Supine to sit: Min guard     General bed mobility comments: Min guard for safety. VC for technique, requires extra time.  Transfers Overall transfer level: Needs assistance Equipment used: Rolling walker (2 wheeled) Transfers: Sit to/from Stand Sit to Stand: Min assist         General transfer comment: Min A to boost to stand. Slow to rise. No buckling of LLE.          ADL Overall ADL's : Needs assistance/impaired Eating/Feeding: Modified independent;Sitting   Grooming: Minimal assistance;Standing   Upper Body Bathing: Set up;Sitting   Lower Body Bathing: Minimal assistance;Sit to/from stand   Upper Body Dressing : Set up;Sitting   Lower Body Dressing: Moderate assistance;Sit to/from stand   Toilet Transfer: Minimal assistance;Ambulation;RW           Functional mobility during ADLs: Minimal  assistance;Rolling walker General ADL Comments: Pt is eager and motivated to participate in therapy. He sat EOB without assist and required min A to stand. Provided heavy min A for balance during ambulation due to LLE ataxia. Encouraged pt to use LUE for functional activities to increase coordination and provided fine motor coordination handout.      Vision Additional Comments: Vision screen [facility]   Perception     Praxis Praxis Praxis tested?: Within functional limits    Pertinent Vitals/Pain Pain Assessment: No/denies pain     Hand Dominance  (Left hand to write due to R thumb injury)   Extremity/Trunk Assessment Upper Extremity Assessment Upper Extremity Assessment: LUE deficits/detail;RUE deficits/detail RUE Deficits / Details: limited thumb flexion due to hx of injury to right hand. Is naturally right handed but learned to write with left hand due to R hand limitations LUE Deficits / Details: Strength 5/5 throughout. Coordination impaired. Hypometria with finger-to-nose; uncoordinated finger-to-thumb; negative disdiadochokinesia test. Sensation intact LUE Coordination: decreased fine motor;decreased gross motor   Lower Extremity Assessment Lower Extremity Assessment: Defer to PT evaluation   Cervical / Trunk Assessment Cervical / Trunk Assessment: Normal   Communication Communication Communication: No difficulties   Cognition Arousal/Alertness: Awake/alert Behavior During Therapy: WFL for tasks assessed/performed Overall Cognitive Status: Within Functional Limits for tasks assessed                                Home Living Family/patient expects to be discharged to:: Inpatient rehab Living Arrangements:  Other relatives (grandson) Available Help at Discharge: Family Type of Home: House Home Access: Level entry     Home Layout: One level               Home Equipment: None   Additional Comments: Works at a golf-course      Prior  Functioning/Environment Level of Independence: Independent        Comments: enjoys golfing    OT Diagnosis: Generalized weakness;Acute pain;Ataxia   OT Problem List: Decreased range of motion;Decreased activity tolerance;Impaired balance (sitting and/or standing);Decreased strength;Decreased coordination;Decreased safety awareness;Decreased knowledge of use of DME or AE;Impaired UE functional use   OT Treatment/Interventions: Self-care/ADL training;Therapeutic exercise;Neuromuscular education;Energy conservation;DME and/or AE instruction;Therapeutic activities;Balance training;Patient/family education    OT Goals(Current goals can be found in the care plan section) Acute Rehab OT Goals Patient Stated Goal: Go to rehab; return to golf OT Goal Formulation: With patient Time For Goal Achievement: 09/04/14 Potential to Achieve Goals: Good ADL Goals Pt Will Perform Grooming: with supervision;standing Pt Will Perform Lower Body Bathing: with supervision;sit to/from stand Pt Will Perform Lower Body Dressing: with supervision;sit to/from stand Pt Will Transfer to Toilet: with supervision;ambulating Pt Will Perform Toileting - Clothing Manipulation and hygiene: with supervision;sit to/from stand Pt/caregiver will Perform Home Exercise Program: Left upper extremity;With Supervision;With written HEP provided  OT Frequency: Min 3X/week    End of Session Equipment Utilized During Treatment: Gait belt;Rolling walker Nurse Communication: Mobility status  Activity Tolerance: Patient tolerated treatment well Patient left: in chair;with call bell/phone within reach;with family/visitor present   Time: [id number] OT Time Calculation (min): 33 min Charges:  OT General Charges $OT Visit: 1 Procedure OT Evaluation $Initial OT Evaluation Tier I: 1 Procedure OT Treatments $Therapeutic Activity: 8-22 mins G-Codes:    Nena JordanMiller, Riyana Biel M 08/21/2014, 5:52 PM  Carney LivingLeeAnn Marie Sarayah Bacchi, OTR/L Occupational  Therapist [phone] (pager)

## 2014-08-22 NOTE — Progress Notes (Addendum)
PROGRESS NOTE  Jeffery Campbell UJW:[id number] DOB: [birth year] DOA: 08/19/2014 PCP: Dennison Mascot, MD  Assessment/Plan: Stroke with left-sided hemiparesis -  -neurology consult: recommending 30 day tele monitoring to r/o a fib- arranged with cardiology -MRI/MRA brain + 2-D echo: below carotid Dopplers : Bilateral: 1-39% ICA stenosis. Vertebral artery flow is antegrade physical therapy consult CIR -hemoglobin A1c 5.6 -LDL: 74 lipitor - PLAVIX  Hypertension - continue present medication   Hyperlipidemia: statin  AKI -resolved with IVF  Code Status: full Family Communication: patient/son Disposition Plan: await CIR decision   Consultants:  neuro  Procedures: Echo: Study Conclusions - Left ventricle: The cavity size was normal. Wall thickness was increased in a pattern of mild LVH. Systolic function was normal. The estimated ejection fraction was in the range of 55% to 60%. Doppler parameters are consistent with abnormal left ventricular relaxation (grade 1 diastolic dysfunction). - Aortic valve: There was mild regurgitation. - Mitral valve: There was mild regurgitation. - Pericardium, extracardiac: A trivial pericardial effusion was identified.     HPI/Subjective: No overnight events No SOB, no CP Eating well  Objective: Filed Vitals:   08/22/14 0937  BP: 120/62  Pulse: 65  Temp: 98.5 F (36.9 C)  Resp: 20   No intake or output data in the 24 hours ending 08/22/14 1125 Filed Weights   08/19/14 0250 08/19/14 0416  Weight: 111.358 kg (245 lb 8 oz) 113.9 kg (251 lb 1.7 oz)    Exam:   General:  Pleasant/cooperative  Cardiovascular: rrr  Respiratory: clear  Abdomen: +BS, soft  Musculoskeletal: no edema  Data Reviewed: Basic Metabolic Panel:  Recent Labs Lab 08/19/14 0227 08/19/14 0230 08/19/14 0831  NA 139 142 138  K 3.5 3.4* 3.8  CL 108 106 108  CO2 21*  --  21*  GLUCOSE 136* 138* 127*  BUN CREATININE 1.30* 1.20  1.05  CALCIUM 8.5*  --  8.6*   Liver Function Tests:  Recent Labs Lab 08/19/14 0227 08/19/14 0831  AST 20 18  ALT 17 17  ALKPHOS 51 47  BILITOT 1.0 1.3*  PROT 6.3* 6.1*  ALBUMIN 3.7 3.5   No results for input(s): LIPASE, AMYLASE in the last 168 hours. No results for input(s): AMMONIA in the last 168 hours. CBC:  Recent Labs Lab 08/19/14 0227 08/19/14 0230 08/19/14 0831  WBC 6.1  --  9.1  NEUTROABS 4.0  --  7.5  HGB 16.2 17.7* 16.8  HCT 47.6 52.0 48.1  MCV 90.2  --  88.9  PLT 171  --  180   Cardiac Enzymes: No results for input(s): CKTOTAL, CKMB, CKMBINDEX, TROPONINI in the last 168 hours. BNP (last 3 results) No results for input(s): BNP in the last 8760 hours.  ProBNP (last 3 results) No results for input(s): PROBNP in the last 8760 hours.  CBG:  Recent Labs Lab 08/19/14 0248  GLUCAP 119*    No results found for this or any previous visit (from the past 240 hour(s)).   Studies: No results found.  Scheduled Meds: .  stroke: mapping our early stages of recovery book   Does not apply Once  . atorvastatin  20 mg Oral Daily  . citalopram  10 mg Oral Daily  . clopidogrel  75 mg Oral Daily  . cyclobenzaprine  5 mg Oral QHS  . diltiazem  120 mg Oral Daily  . enoxaparin (LOVENOX) injection  40 mg Subcutaneous Q24H  . omega-3 acid ethyl esters  1 g Oral Daily  .  pantoprazole  40 mg Oral Daily  . terazosin  5 mg Oral QHS  . vitamin B-12  2,000 mcg Oral Daily   Continuous Infusions:  Antibiotics Given (last 72 hours)    None      Principal Problem:   Stroke Active Problems:   Hypertension   HLD (hyperlipidemia)   CKD (chronic kidney disease) stage 3, GFR 30-59 ml/min   Accelerated hypertension   Cerebral infarction due to thrombosis of right carotid artery   Obesity    Time spent: 25 min    Jeffery Campbell  [facility] Pager [phone]. If 7PM-7AM, please contact night-coverage at [email or web address], password [email or web address] 08/22/2014, 11:25 AM  LOS:  3 days

## 2014-08-23 ENCOUNTER — Inpatient Hospital Stay (HOSPITAL_COMMUNITY): Payer: Commercial Managed Care - HMO

## 2014-08-23 ENCOUNTER — Inpatient Hospital Stay (HOSPITAL_COMMUNITY)
Admission: RE | Admit: 2014-08-23 | Discharge: 2014-09-01 | DRG: 057 | Disposition: A | Discharge status: Home Health | Payer: Commercial Managed Care - HMO | Source: Other Acute Inpatient Hospital | Attending: Physical Medicine & Rehabilitation | Admitting: Physical Medicine & Rehabilitation

## 2014-08-23 DIAGNOSIS — I63011 Cerebral infarction due to thrombosis of right vertebral artery: Secondary | ICD-10-CM | POA: Diagnosis not present

## 2014-08-23 DIAGNOSIS — R270 Ataxia, unspecified: Secondary | ICD-10-CM | POA: Diagnosis present

## 2014-08-23 DIAGNOSIS — I6381 Other cerebral infarction due to occlusion or stenosis of small artery: Secondary | ICD-10-CM | POA: Diagnosis present

## 2014-08-23 DIAGNOSIS — I639 Cerebral infarction, unspecified: Secondary | ICD-10-CM | POA: Diagnosis not present

## 2014-08-23 DIAGNOSIS — K219 Gastro-esophageal reflux disease without esophagitis: Secondary | ICD-10-CM | POA: Diagnosis present

## 2014-08-23 DIAGNOSIS — R062 Wheezing: Secondary | ICD-10-CM | POA: Diagnosis present

## 2014-08-23 DIAGNOSIS — I69354 Hemiplegia and hemiparesis following cerebral infarction affecting left non-dominant side: Principal | ICD-10-CM

## 2014-08-23 DIAGNOSIS — I63031 Cerebral infarction due to thrombosis of right carotid artery: Secondary | ICD-10-CM | POA: Diagnosis present

## 2014-08-23 DIAGNOSIS — I10 Essential (primary) hypertension: Secondary | ICD-10-CM

## 2014-08-23 DIAGNOSIS — F419 Anxiety disorder, unspecified: Secondary | ICD-10-CM | POA: Diagnosis present

## 2014-08-23 DIAGNOSIS — I69322 Dysarthria following cerebral infarction: Secondary | ICD-10-CM | POA: Diagnosis not present

## 2014-08-23 DIAGNOSIS — I63339 Cerebral infarction due to thrombosis of unspecified posterior cerebral artery: Secondary | ICD-10-CM | POA: Diagnosis not present

## 2014-08-23 DIAGNOSIS — M170 Bilateral primary osteoarthritis of knee: Secondary | ICD-10-CM | POA: Diagnosis present

## 2014-08-23 DIAGNOSIS — E780 Pure hypercholesterolemia: Secondary | ICD-10-CM | POA: Diagnosis present

## 2014-08-23 DIAGNOSIS — G8190 Hemiplegia, unspecified affecting unspecified side: Secondary | ICD-10-CM | POA: Diagnosis not present

## 2014-08-23 DIAGNOSIS — G8194 Hemiplegia, unspecified affecting left nondominant side: Secondary | ICD-10-CM | POA: Diagnosis present

## 2014-08-23 LAB — BASIC METABOLIC PANEL
ANION GAP: 8 (ref 5–15)
BUN: 18 mg/dL (ref 6–20)
CALCIUM: 8.8 mg/dL — AB (ref 8.9–10.3)
CO2: 26 mmol/L (ref 22–32)
Chloride: 106 mmol/L (ref 101–111)
Creatinine, Ser: 1.27 mg/dL — ABNORMAL HIGH (ref 0.61–1.24)
GFR calc Af Amer: 59 mL/min — ABNORMAL LOW (ref >60)
GFR calc non Af Amer: 50 mL/min — ABNORMAL LOW (ref >60)
GLUCOSE: 104 mg/dL — AB (ref 65–99)
POTASSIUM: 3.9 mmol/L (ref 3.5–5.1)
Sodium: 140 mmol/L (ref 135–145)

## 2014-08-23 LAB — CBC
HCT: 47.3 % (ref 39.0–52.0)
Hemoglobin: 16.8 g/dL (ref 13.0–17.0)
MCH: 31.8 pg (ref 26.0–34.0)
MCHC: 35.5 g/dL (ref 30.0–36.0)
MCV: 89.6 fL (ref 78.0–100.0)
Platelets: 177 10*3/uL (ref 150–400)
RBC: 5.28 MIL/uL (ref 4.22–5.81)
RDW: 12.4 % (ref 11.5–15.5)
WBC: 7.8 10*3/uL (ref 4.0–10.5)

## 2014-08-23 MED ORDER — PROCHLORPERAZINE 25 MG RE SUPP
12.5000 mg | Freq: Four times a day (QID) | RECTAL | Status: DC | PRN
  Filled 2014-08-23: qty 1

## 2014-08-23 MED ORDER — ENOXAPARIN SODIUM 40 MG/0.4ML ~~LOC~~ SOLN
40.0000 mg | SUBCUTANEOUS | Status: DC
  Administered 2014-08-24 – 2014-08-31 (×8): 40 mg via SUBCUTANEOUS
  Filled 2014-08-23 (×9): qty 0.4

## 2014-08-23 MED ORDER — PROCHLORPERAZINE EDISYLATE 5 MG/ML IJ SOLN
5.0000 mg | Freq: Four times a day (QID) | INTRAMUSCULAR | Status: DC | PRN
  Filled 2014-08-23: qty 2

## 2014-08-23 MED ORDER — CLOPIDOGREL BISULFATE 75 MG PO TABS
75.0000 mg | ORAL_TABLET | Freq: Every day | ORAL | Status: DC
  Administered 2014-08-24 – 2014-09-01 (×9): 75 mg via ORAL
  Filled 2014-08-23 (×11): qty 1

## 2014-08-23 MED ORDER — OMEGA-3-ACID ETHYL ESTERS 1 G PO CAPS
1.0000 g | ORAL_CAPSULE | Freq: Every day | ORAL | Status: DC
  Administered 2014-08-24 – 2014-09-01 (×9): 1 g via ORAL
  Filled 2014-08-23 (×10): qty 1

## 2014-08-23 MED ORDER — CITALOPRAM HYDROBROMIDE 10 MG PO TABS
10.0000 mg | ORAL_TABLET | Freq: Every day | ORAL | Status: DC
  Administered 2014-08-24 – 2014-09-01 (×9): 10 mg via ORAL
  Filled 2014-08-23 (×10): qty 1

## 2014-08-23 MED ORDER — CLOPIDOGREL BISULFATE 75 MG PO TABS: 75.0000 mg | ORAL_TABLET | Freq: Every day | ORAL | Status: DC

## 2014-08-23 MED ORDER — CYCLOBENZAPRINE HCL 5 MG PO TABS
5.0000 mg | ORAL_TABLET | Freq: Every day | ORAL | Status: DC
  Administered 2014-08-23 – 2014-08-31 (×9): 5 mg via ORAL
  Filled 2014-08-23 (×10): qty 1

## 2014-08-23 MED ORDER — FLEET ENEMA 7-19 GM/118ML RE ENEM: 1.0000 | ENEMA | Freq: Once | RECTAL | Status: AC | PRN

## 2014-08-23 MED ORDER — GUAIFENESIN-DM 100-10 MG/5ML PO SYRP: 5.0000 mL | ORAL_SOLUTION | Freq: Four times a day (QID) | ORAL | Status: DC | PRN

## 2014-08-23 MED ORDER — ACETAMINOPHEN 325 MG PO TABS
325.0000 mg | ORAL_TABLET | ORAL | Status: DC | PRN
Start: 2014-08-23 — End: 2014-09-01

## 2014-08-23 MED ORDER — KETOTIFEN FUMARATE 0.025 % OP SOLN
2.0000 [drp] | Freq: Two times a day (BID) | OPHTHALMIC | Status: DC
  Administered 2014-08-23 – 2014-09-01 (×18): 2 [drp] via OPHTHALMIC
  Filled 2014-08-23: qty 5

## 2014-08-23 MED ORDER — DILTIAZEM HCL ER COATED BEADS 120 MG PO CP24
120.0000 mg | ORAL_CAPSULE | Freq: Every day | ORAL | Status: DC
  Administered 2014-08-24 – 2014-09-01 (×9): 120 mg via ORAL
  Filled 2014-08-23 (×10): qty 1

## 2014-08-23 MED ORDER — PROCHLORPERAZINE MALEATE 5 MG PO TABS
5.0000 mg | ORAL_TABLET | Freq: Four times a day (QID) | ORAL | Status: DC | PRN
Start: 2014-08-23 — End: 2014-09-01
  Filled 2014-08-23: qty 2

## 2014-08-23 MED ORDER — BISACODYL 10 MG RE SUPP: 10.0000 mg | Freq: Every day | RECTAL | Status: DC | PRN

## 2014-08-23 MED ORDER — ATORVASTATIN CALCIUM 20 MG PO TABS
20.0000 mg | ORAL_TABLET | Freq: Every day | ORAL | Status: DC
  Administered 2014-08-24 – 2014-09-01 (×9): 20 mg via ORAL
  Filled 2014-08-23 (×11): qty 1

## 2014-08-23 MED ORDER — ALUM & MAG HYDROXIDE-SIMETH 200-200-20 MG/5ML PO SUSP: 30.0000 mL | ORAL | Status: DC | PRN

## 2014-08-23 MED ORDER — DIAZEPAM 5 MG PO TABS: 5.0000 mg | ORAL_TABLET | Freq: Three times a day (TID) | ORAL | Status: DC | PRN

## 2014-08-23 MED ORDER — TERAZOSIN HCL 5 MG PO CAPS
5.0000 mg | ORAL_CAPSULE | Freq: Every day | ORAL | Status: DC
  Administered 2014-08-23 – 2014-08-31 (×9): 5 mg via ORAL
  Filled 2014-08-23 (×11): qty 1

## 2014-08-23 MED ORDER — ENOXAPARIN SODIUM 40 MG/0.4ML ~~LOC~~ SOLN
40.0000 mg | SUBCUTANEOUS | Status: DC
  Filled 2014-08-23: qty 0.4

## 2014-08-23 MED ORDER — SENNOSIDES-DOCUSATE SODIUM 8.6-50 MG PO TABS
1.0000 | ORAL_TABLET | Freq: Every evening | ORAL | Status: DC | PRN
Start: 2014-08-23 — End: 2014-09-01
  Filled 2014-08-23: qty 1

## 2014-08-23 MED ORDER — PANTOPRAZOLE SODIUM 40 MG PO TBEC
40.0000 mg | DELAYED_RELEASE_TABLET | Freq: Every day | ORAL | Status: DC
  Administered 2014-08-24 – 2014-09-01 (×9): 40 mg via ORAL
  Filled 2014-08-23 (×11): qty 1

## 2014-08-23 MED ORDER — TRAZODONE HCL 50 MG PO TABS: 25.0000 mg | ORAL_TABLET | Freq: Every evening | ORAL | Status: DC | PRN

## 2014-08-23 MED ORDER — VITAMIN B-12 1000 MCG PO TABS
2000.0000 ug | ORAL_TABLET | Freq: Every day | ORAL | Status: DC
  Administered 2014-08-24 – 2014-09-01 (×9): 2000 ug via ORAL
  Filled 2014-08-23 (×11): qty 2

## 2014-08-23 NOTE — Progress Notes (Signed)
[facility] Admission Coordinator Signed Physical Medicine and Rehabilitation PMR Pre-admission 08/23/2014 12:38 PM  Related encounter: ED to Hosp-Admission (Discharged) from 08/19/2014 in [facility] Collapse All   PMR Admission Coordinator Pre-Admission Assessment  Patient: Jeffery Campbell is an 81 y.o., male MRN: [id number] DOB: Feb 16, 1929 Height: _0  (185.4 cm) Weight: 113.9 kg (251 lb 1.7 oz)  Insurance Information HMO: yes PPO: PCP: IPA: 80/20: OTHER:  PRIMARY: [employer] Silver[employer] Policy#: [id number] Subscriber: self CM Name: Ander Purpura RN Phone#: [phone] Fax#: [phone] Approval given on 08-23-14 for seven days. Follow up will be with onsite reviewer Lauren. Pre-Cert#: [id number] Employer: retired Benefits: Phone #: [phone] Name: online Eff. Date: 04-02-14 Deduct: none Out of Pocket Max: $5500 (met $10.00) Life Max: none CIR: $275 copay/day for days 1-7, pre-auth needed SNF: 100% days 1-20; $160/day for days 21-100, pre-auth needed Outpatient: covered Co-Pay: $40 copay, no visit limits Home Health: 100% Co-Pay: none DME: 80/20% Co-Pay: none Providers: in network  Emergency Contact Information Contact Information    Name Relation Home Work Mobile   Spencer Son   [phone]   [place], Mastrangelo   [phone]     Current Medical History  Patient Admitting Diagnosis: right thalamic/PLIC infarct  History of Present Illness: Jeffery Campbell is a 81 y.o. male with history of HTN, OA, bradycardia, anxiety d/o; who was admitted on 08/19/14 with left sided weakness and fall with inability to walk. CT head without acute changes. MRI/MRA brain done revealing  acute ischemia right lateral thalamus v/s posterior limb of internal capsule. Carotid dopplers without significant ICA stenosis. 2D echo with EF 55-62%, no wall abnormality and grade I diastolic dysfunction. Dr. Erlinda Hong evaluated patient and recommends changing chronic ASA to plavix for stroke likely due to SVD. EKG with frequent PVCs but negative for A fib and outpatient cardiac monitoring recommended pending full work up. Therapy evaluations being done today and CIR recommended for follow up therapy.   NIH Total: 3  Past Medical History  Past Medical History  Diagnosis Date  . Diverticulitis   . Hypertension   . Pure hypercholesterolemia   . Hemorrhoids   . OA (osteoarthritis)   . Disturbance of sleep     Family History  family history includes Hypertension in his son.  Prior Rehab/Hospitalizations: none  Current Medications   Current facility-administered medications:  . stroke: mapping our early stages of recovery book, , Does not apply, Once, Rise Patience, MD . atorvastatin (LIPITOR) tablet 20 mg, 20 mg, Oral, Daily, Rise Patience, MD, 20 mg at 08/23/14 1031 . citalopram (CELEXA) tablet 10 mg, 10 mg, Oral, Daily, Rise Patience, MD, 10 mg at 08/23/14 1030 . clopidogrel (PLAVIX) tablet 75 mg, 75 mg, Oral, Daily, Donzetta Starch, NP, 75 mg at 08/23/14 1031 . cyclobenzaprine (FLEXERIL) tablet 5 mg, 5 mg, Oral, QHS, Rise Patience, MD, 5 mg at 08/22/14 2111 . diazepam (VALIUM) tablet 5 mg, 5 mg, Oral, Q8H PRN, Rise Patience, MD . diltiazem (CARDIZEM CD) 24 hr capsule 120 mg, 120 mg, Oral, Daily, Rise Patience, MD, 120 mg at 08/23/14 1030 . enoxaparin (LOVENOX) injection 40 mg, 40 mg, Subcutaneous, Q24H, Rise Patience, MD, 40 mg at 08/23/14 0521 . omega-3 acid ethyl esters (LOVAZA) capsule 1 g, 1 g, Oral, Daily, Rise Patience, MD, 1 g at 08/23/14 1031 . pantoprazole (PROTONIX) EC tablet 40 mg, 40 mg,  Oral, Daily, Rise Patience, MD,  40 mg at 08/23/14 1030 . senna-docusate (Senokot-S) tablet 1 tablet, 1 tablet, Oral, QHS PRN, Rise Patience, MD, 1 tablet at 08/21/14 2127 . terazosin (HYTRIN) capsule 5 mg, 5 mg, Oral, QHS, Rise Patience, MD, 5 mg at 08/22/14 2111 . 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 He enjoys hanging out with his buddies and going to his church.   Home Assistive Devices / Equipment Home Assistive Devices/Equipment: Eyeglasses Home Equipment: None  Prior Functional Level Prior Function Level of Independence: Independent Comments: enjoys golfing  Current Functional Level Cognition  Overall Cognitive Status: Within Functional Limits for tasks assessed Orientation Level: Oriented X4   Extremity Assessment (includes Sensation/Coordination)  Upper Extremity Assessment: LUE deficits/detail, RUE deficits/detail RUE Deficits / Details: limited thumb flexion due to hx of injury to right hand. Is naturally right handed but learned to write with left hand due to R hand limitations LUE Deficits / Details: Strength 5/5 throughout. Coordination impaired. Hypometria with finger-to-nose; uncoordinated finger-to-thumb; negative disdiadochokinesia test. Sensation intact LUE Coordination: decreased fine motor, decreased gross motor  Lower Extremity Assessment: Defer to PT evaluation LLE Deficits / Details: 5/5 MMT RLE --- 4/5 left hip flexion, left knee extension. Abnormal Lt heel to  shin test. Poor ankle proprioception/kinesthetic awareness. Normal light touch sensation BIL LEs. LLE Sensation: decreased proprioception LLE Coordination: decreased gross motor    ADLs  Overall ADL's : Needs assistance/impaired Eating/Feeding: Modified independent, Sitting Grooming: Minimal assistance, Standing Upper Body Bathing: Set up, Sitting Lower Body Bathing: Minimal assistance, Sit to/from stand Upper Body Dressing : Set up, Sitting Lower Body Dressing: Moderate assistance, Sit to/from stand Toilet Transfer: Minimal assistance, Ambulation, RW Functional mobility during ADLs: Minimal assistance, Rolling walker General ADL Comments: Pt is eager and motivated to participate in therapy. He sat EOB without assist and required min A to stand. Provided heavy min A for balance during ambulation due to LLE ataxia. Encouraged pt to use LUE for functional activities to increase coordination and provided fine motor coordination handout.     Mobility  Overal bed mobility: Needs Assistance Bed Mobility: Supine to Sit Rolling: Min guard Sidelying to sit: Min guard, HOB elevated Supine to sit: Min guard General bed mobility comments: pt needs increased time, but is able to complete with only guarding A.     Transfers  Overall transfer level: Needs assistance Equipment used: Rolling walker (2 wheeled) Transfers: Sit to/from Stand Sit to Stand: Min assist General transfer comment: cues for Ue use and getting closer to chair prior to sitting. Decreased L lateral lean today.     Ambulation / Gait / Stairs / Wheelchair Mobility  Ambulation/Gait Ambulation/Gait assistance: Museum/gallery curator (Feet): 80 Feet Assistive device: Rolling walker (2 wheeled) General Gait Details: cues for safe use of RW, upright posture, increased heel strike on L, increase L LE extension in stance. pt fatigues easily on L side and begins to lean L. pt needed cues to stop and correct  posture/balance, then continue to ambulate back to chair.  Gait Pattern/deviations: Step-to pattern, Decreased step length - left, Decreased  stance time - left, Decreased dorsiflexion - left (L lateral lean) Gait velocity: decreased Gait velocity interpretation: Below normal speed for age/gender    Posture / Balance Dynamic Sitting Balance Sitting balance - Comments: Leaning left, able to self correct with effort and cues Balance Overall balance assessment: Needs assistance Sitting-balance support: No upper extremity supported, Feet supported Sitting balance-Leahy Scale: Fair Sitting balance - Comments: Leaning left, able to self correct with effort and cues Postural control: Left lateral lean Standing balance support: Bilateral upper extremity supported, During functional activity Standing balance-Leahy Scale: Poor Standing balance comment: L lateral lean, worse with fatigue.     Special needs/care consideration BiPAP/CPAP no (pt's son believes his dad may have some issues with possible sleep apnea due to snoring and pt was on O2 at night during his acute stay.) CPM no Continuous Drip IV no  Dialysis no  Life Vest no Oxygen - on room air but was on O2 by nasal cannula at night during his acute stay Special Bed no  Trach Size no  Wound Vac (area) no  Skin - no current issues  Bowel mgmt: last BM on 08-22-14 Bladder mgmt: currently using urinal Diabetic mgmt no   Previous Home Environment Living Arrangements: Other relatives (grandson) Available Help at Discharge: Family Type of Home: House Home Layout: One level Home Access: Level entry Home Care Services: No Additional Comments: Works at a Human resources officer for Discharge Living Setting: Patient's home, House Type of Home at Discharge: House Discharge Home Access: Stairs to enter Entrance Stairs-Rails: None Entrance Stairs-Number of  Steps: one Does the patient have any problems obtaining your medications?: No (pt is hoping to return to driving. 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 Other son Chaseton Yepiz is POA and lives in [place]. Anticipated Caregiver: local son Clydene Laming and pt's grandson Anticipated Caregiver's Contact Information: see above Ability/Limitations of Caregiver: Pt lives with his grandson. Grandson works for his dad (pt's son named Film/video editor) and both son and grandson will be checking on pt as needed. Son does work also.  Caregiver Availability: Intermittent (both pt's son and grandson work together and have [employer]) Discharge Plan Discussed with Primary Caregiver: Yes Is Caregiver In Agreement with Plan?: Yes Does Caregiver/Family have Issues with Lodging/Transportation while Pt is in Rehab?: No  Goals/Additional Needs Patient/Family Goal for Rehab: Mod Ind with PT/OT; NA for SLP Expected length of stay: 7-10 days Cultural Considerations: pt enjoys his church [employer]) Dietary Needs: heart healthy Equipment Needs: to be determined Pt/Family Agrees to Admission and willing to participate: Yes (spoke with pt and his son) Program Orientation Provided & Reviewed with Pt/Caregiver Including Roles & Responsibilities: Yes   Decrease burden of Care through IP rehab admission: NA  Possible need for SNF placement upon discharge: not anticipated  Patient Condition: This patient's condition remains as documented in the consult dated 08-23-14, in which the Rehabilitation Physician determined and documented that the patient's condition is appropriate for intensive rehabilitative care in an inpatient rehabilitation facility. Will admit to inpatient rehab today.  Preadmission Screen Completed By: Nanetta Batty, PT,  08/23/2014 1:57 PM ______________________________________________________________________  Discussed status with Dr. Naaman Plummer on 08-23-14 at 1357 and received telephone approval for admission today.  Admission Coordinator: Nanetta Batty, PT, time1357/Date 08-23-14          Cosigned by: Alroy Dust  Alen Blew, MD at 08/23/2014 2:19 PM  Revision History     Date/Time User Provider Type Action   08/23/2014 2:19 PM Meredith Staggers, MD Physician Cosign   08/23/2014 2:02 PM [facility] Admission Coordinator Sign

## 2014-08-23 NOTE — H&P (Signed)
Expand All Collapse All      Physical Medicine and Rehabilitation Admission H&P   Chief Complaint  Patient presents with  . Left sided weakness and slurred speech   HPI: Jeffery Campbell is a 81 y.o. male with history of HTN, OA, bradycardia, anxiety d/o; who was admitted on 08/19/14 with left sided weakness and fall with inability to walk. CT head without acute changes. MRI/MRA brain done revealing acute ischemia right lateral thalamus v/s posterior limb of internal capsule. Carotid dopplers without significant ICA stenosis. 2D echo with EF 55-62%, no wall abnormality and grade I diastolic dysfunction. Dr. Erlinda Hong evaluated patient and recommends changing chronic ASA to plavix for stroke likely due to SVD. EKG with frequent PVCs but negative for A fib and outpatient cardiac monitoring arranged with cardiology. Carotid dopplers without significant ICA stenosis. Patient with resultant left sided weakness and mild flaccid dysarthria. CIR recommended by MD and rehab team and patient admitted today.    Review of Systems  HENT: Negative for hearing loss.  Eyes: Positive for discharge. Negative for blurred vision and double vision.  Respiratory: Positive for wheezing. Negative for cough and shortness of breath.  Cardiovascular: Negative for chest pain and palpitations.  Gastrointestinal: Negative for nausea, abdominal pain and constipation.  Genitourinary: Negative for dysuria and urgency.  Musculoskeletal: Negative for myalgias and back pain.  Neurological: Positive for speech change and focal weakness. Negative for headaches.      Past Medical History  Diagnosis Date  . Diverticulitis   . Hypertension   . Pure hypercholesterolemia   . Hemorrhoids   . OA (osteoarthritis)   . Disturbance of sleep     Past Surgical History  Procedure Laterality Date  . Transurethral resection of prostate    . Knee arthroscopy    . Colonoscopy      Family  History  Problem Relation Age of Onset  . Hypertension Son     Social History: Lives with grandson. 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 He reports that he has never smoked. He does not have any smokeless tobacco history on file. He reports that he does not drink alcohol or use illicit drugs.   Allergies: No Known Allergies    Medications Prior to Admission  Medication Sig Dispense Refill  . aspirin 81 MG tablet Take 81 mg by mouth daily.    [name] atorvastatin (LIPITOR) 20 MG tablet Take 20 mg by mouth daily.    . citalopram (CELEXA) 10 MG tablet Take 10 mg by mouth daily.    . cyclobenzaprine (FLEXERIL) 5 MG tablet Take 5 mg by mouth at bedtime.    . diazepam (VALIUM) 5 MG tablet Take 5 mg by mouth every 8 (eight) hours as needed for anxiety.    [name] diltiazem (CARDIZEM CD) 120 MG 24 hr capsule Take 1 capsule (120 mg total) by mouth daily. 90 capsule 3  . lansoprazole (PREVACID) 30 MG capsule Take 30 mg by mouth daily at 12 noon.    . meloxicam (MOBIC) 15 MG tablet Take 15 mg by mouth daily.  2  . Omega 3 1200 MG CAPS Take by mouth daily.    [name] terazosin (HYTRIN) 5 MG capsule Take 5 mg by mouth at bedtime.    . vitamin B-12 (CYANOCOBALAMIN) 1000 MCG tablet Take 2,000 mcg by mouth daily.       Home: Home Living Family/patient expects to be discharged to:: Inpatient rehab Living Arrangements: Other relatives (grandson) Available  Help at Discharge: Family Type of Home: House Home Access: Level entry Home Layout: One level Home Equipment: None Additional Comments: Works at a [employer] History: Prior Function Level of Independence: Independent Comments: enjoys golfing  Functional Status:  Mobility: Bed Mobility Overal bed mobility: Needs Assistance Bed Mobility: Supine to Sit Rolling: Min guard Sidelying to sit: Min guard, HOB elevated Supine to  sit: Min guard General bed mobility comments: pt needs increased time, but is able to complete with only guarding A.  Transfers Overall transfer level: Needs assistance Equipment used: Rolling walker (2 wheeled) Transfers: Sit to/from Stand Sit to Stand: Min assist General transfer comment: cues for Ue use and getting closer to chair prior to sitting. Decreased L lateral lean today.  Ambulation/Gait Ambulation/Gait assistance: Min assist Ambulation Distance (Feet): 80 Feet Assistive device: Rolling walker (2 wheeled) General Gait Details: cues for safe use of RW, upright posture, increased heel strike on L, increase L LE extension in stance. pt fatigues easily on L side and begins to lean L. pt needed cues to stop and correct posture/balance, then continue to ambulate back to chair.  Gait Pattern/deviations: Step-to pattern, Decreased step length - left, Decreased stance time - left, Decreased dorsiflexion - left (L lateral lean) Gait velocity: decreased Gait velocity interpretation: Below normal speed for age/gender    ADL: ADL Overall ADL's : Needs assistance/impaired Eating/Feeding: Modified independent, Sitting Grooming: Minimal assistance, Standing Upper Body Bathing: Set up, Sitting Lower Body Bathing: Minimal assistance, Sit to/from stand Upper Body Dressing : Set up, Sitting Lower Body Dressing: Moderate assistance, Sit to/from stand Toilet Transfer: Minimal assistance, Ambulation, RW Functional mobility during ADLs: Minimal assistance, Rolling walker General ADL Comments: Pt is eager and motivated to participate in therapy. He sat EOB without assist and required min A to stand. Provided heavy min A for balance during ambulation due to LLE ataxia. Encouraged pt to use LUE for functional activities to increase coordination and provided fine motor coordination handout.   Cognition: Cognition Overall Cognitive Status: Within Functional Limits for tasks  assessed Orientation Level: Oriented X4 Cognition Arousal/Alertness: Awake/alert Behavior During Therapy: WFL for tasks assessed/performed Overall Cognitive Status: Within Functional Limits for tasks assessed   Blood pressure 142/69, pulse 79, temperature 98.3 F (36.8 C), temperature source Oral, resp. rate 18, height _0  (1.854 m), weight 113.9 kg (251 lb 1.7 oz), SpO2 93 %. Physical Exam Nursing note and vitals reviewed. Constitutional: He is oriented to person, place, and time. He appears well-developed and well-nourished.  HENT:  Head: Normocephalic and atraumatic.  Eyes: Conjunctivae are normal. Pupils are equal, round, and reactive to light.  Neck: Normal range of motion. Neck supple.  Cardiovascular: Normal rate and regular rhythm. no murmur Respiratory: Effort normal and breath sounds normal. No respiratory distress. Occasional auditory wheezes.  GI: Soft. Bowel sounds are normal. He exhibits no distension. There is no tenderness.  Musculoskeletal: He exhibits no edema or tenderness.  Neurological: He is alert and oriented to person, place, and time. Normal insight and awareness. He has normal reflexes.  Left central 7 and tongue deviation. Speech slightly slurred but intelligible. Follows one and two step commands without difficulty LUE and LLE proprioceptive deficits. . Left upper and left lower limb ataxia with fine movements.. Motor: 4+/5 left deltoid, bicep, tricep, hi, hip, knee, ankle.  Skin: Skin is warm and dry.  Psychiatric: He has a normal mood and affect. His behavior is normal. Judgment and thought content normal.     Lab  Results Last 48 Hours    Results for orders placed or performed during the hospital encounter of 08/19/14 (from the past 48 hour(s))  CBC Status: None   Collection Time: 08/23/14 5:19 AM  Result Value Ref Range   WBC 7.8 4.0 - 10.5 K/uL   RBC 5.28 4.22 - 5.81 MIL/uL   Hemoglobin 16.8 13.0 - 17.0 g/dL    HCT 47.3 39.0 - 52.0 %   MCV 89.6 78.0 - 100.0 fL   MCH 31.8 26.0 - 34.0 pg   MCHC 35.5 30.0 - 36.0 g/dL   RDW 12.4 11.5 - 15.5 %   Platelets 177 150 - 400 K/uL  Basic metabolic panel Status: Abnormal   Collection Time: 08/23/14 5:19 AM  Result Value Ref Range   Sodium 140 135 - 145 mmol/L   Potassium 3.9 3.5 - 5.1 mmol/L   Chloride 106 101 - 111 mmol/L   CO2 26 22 - 32 mmol/L   Glucose, Bld 104 (H) 65 - 99 mg/dL   BUN 18 6 - 20 mg/dL   Creatinine, Ser 1.27 (H) 0.61 - 1.24 mg/dL   Calcium 8.8 (L) 8.9 - 10.3 mg/dL   GFR calc non Af Amer 50 (L) >60 mL/min   GFR calc Af Amer 59 (L) >60 mL/min    Comment: (NOTE) The eGFR has been calculated using the CKD EPI equation. This calculation has not been validated in all clinical situations. eGFR's persistently <60 mL/min signify possible Chronic Kidney Disease.    Anion gap 8 5 - 15      Imaging Results (Last 48 hours)    No results found.       Medical Problem List and Plan: 1. Functional deficits secondary to right thalamic/PLIC infarct 2. DVT Prophylaxis/Anticoagulation: Pharmaceutical: Lovenox 3. Pain Management: Off meloxicam. Tylenol prn for OA pain. Continue Flexeril at bedtime.  4. Mood: Continue Celexa daily with valium prn. Motivated and no signs of distress noted. LCSW to follow for evaluation and support.  5. Neuropsych: This patient is capable of making decisions on his own behalf. 6. Skin/Wound Care: Routine pressure relief measures.  7. Fluids/Electrolytes/Nutrition: Monitor I/O. Check lytes in am. Offer supplements prn poor intake. 8. HTN: Monitor every 8 hours. Continue Cardizem and Hytrin. Avoid hypotension.  9. GERD: symptoms stable on protonix.  10. Wheezing: Denies respiratory symptoms. Will check CXR to rule out acute process      Post Admission Physician Evaluation: 1. Functional deficits secondary to right  thalamic infarct/PLIC infarct. 2. Patient is admitted to receive collaborative, interdisciplinary care between the physiatrist, rehab nursing staff, and therapy team. 3. Patient's level of medical complexity and substantial therapy needs in context of that medical necessity cannot be provided at a lesser intensity of care such as a SNF. 4. Patient has experienced substantial functional loss from his/her baseline which was documented above under the "Functional History" and "Functional Status" headings. Judging by the patient's diagnosis, physical exam, and functional history, the patient has potential for functional progress which will result in measurable gains while on inpatient rehab. These gains will be of substantial and practical use upon discharge in facilitating mobility and self-care at the household level. 5. Physiatrist will provide 24 hour management of medical needs as well as oversight of the therapy plan/treatment and provide guidance as appropriate regarding the interaction of the two. 6. 24 hour rehab nursing will assist with bladder management, bowel management, safety, skin/wound care, disease management, medication administration, pain management and patient education and help integrate  therapy concepts, techniques,education, etc. 7. PT will assess and treat for/with: Lower extremity strength, range of motion, stamina, balance, functional mobility, safety, adaptive techniques and equipment, NMR, visual perceptual awareness, stroke education. Goals are: mod I. 8. OT will assess and treat for/with: ADL's, functional mobility, safety, upper extremity strength, adaptive techniques and equipment, NMR, cognitive perceptual awareness, safety, stroke education. Goals are: mod I. Therapy may proceed with showering this patient. 9. SLP will assess and treat for/with: n/a. Goals are: n/a. 10. Case Management and Social Worker will assess and treat for psychological issues and discharge  planning. 11. Team conference will be held weekly to assess progress toward goals and to determine barriers to discharge. 12. Patient will receive at least 3 hours of therapy per day at least 5 days per week. 13. ELOS: 7 days  14. Prognosis: excellent  Meredith Staggers, MD, [facility] 08/23/2014      **

## 2014-08-23 NOTE — Progress Notes (Signed)
Physical Therapy Treatment Patient Details Name: Jeffery BrittleJoe Pelissier MRN: [id number] DOB: Sep 11, 1928 Today's Date: 08/23/2014    History of Present Illness 81 y.o. male admitted for left sided weakness. MRI reveals Acute ischemia RIGHT lateral thalamus versus posterior limb of the internal capsule.    PT Comments    Pt very receptive to cueing and even recalled some of the cueing from the previous PT session.  Pt ed on LE there ex and coordination exercises to perform during down time while on acute.  Continue to feel pt would benefit from CIR at D/C to maximize independence.  Will continue to follow.    Follow Up Recommendations  CIR     Equipment Recommendations  None recommended by PT    Recommendations for Other Services       Precautions / Restrictions Precautions Precautions: Fall Restrictions Weight Bearing Restrictions: No    Mobility  Bed Mobility Overal bed mobility: Needs Assistance Bed Mobility: Supine to Sit     Supine to sit: Min guard     General bed mobility comments: pt needs increased time, but is able to complete with only guarding A.    Transfers Overall transfer level: Needs assistance Equipment used: Rolling walker (2 wheeled) Transfers: Sit to/from Stand Sit to Stand: Min assist         General transfer comment: cues for Ue use and getting closer to chair prior to sitting.  Decreased L lateral lean today.    Ambulation/Gait Ambulation/Gait assistance: Min assist Ambulation Distance (Feet): 80 Feet Assistive device: Rolling walker (2 wheeled) Gait Pattern/deviations: Step-to pattern;Decreased step length - left;Decreased stance time - left;Decreased dorsiflexion - left (L lateral lean)     General Gait Details: cues for safe use of RW, upright posture, increased heel strike on L, increase L LE extension in stance.  pt fatigues easily on L side and begins to lean L.  pt needed cues to stop and correct posture/balance, then continue to ambulate  back to chair.     Stairs            Wheelchair Mobility    Modified Rankin (Stroke Patients Only) Modified Rankin (Stroke Patients Only) Pre-Morbid Rankin Score: No symptoms Modified Rankin: Moderately severe disability     Balance Overall balance assessment: Needs assistance Sitting-balance support: No upper extremity supported;Feet supported Sitting balance-Leahy Scale: Fair   Postural control: Left lateral lean Standing balance support: Bilateral upper extremity supported;During functional activity Standing balance-Leahy Scale: Poor Standing balance comment: L lateral lean, worse with fatigue.                      Cognition Arousal/Alertness: Awake/alert Behavior During Therapy: WFL for tasks assessed/performed Overall Cognitive Status: Within Functional Limits for tasks assessed                      Exercises General Exercises - Lower Extremity Ankle Circles/Pumps: AROM;Both;10 reps Long Arc Quad: AROM;Left;10 reps    General Comments        Pertinent Vitals/Pain Pain Assessment: No/denies pain    Home Living                      Prior Function            PT Goals (current goals can now be found in the care plan section) Acute Rehab PT Goals Patient Stated Goal: Go to rehab; return to golf PT Goal Formulation: With patient Time For Goal Achievement:  09/03/14 Potential to Achieve Goals: Good Progress towards PT goals: Progressing toward goals    Frequency  Min 4X/week    PT Plan Current plan remains appropriate    Co-evaluation             End of Session Equipment Utilized During Treatment: Gait belt Activity Tolerance: Patient tolerated treatment well Patient left: in chair;with call bell/phone within reach;with chair alarm set;with family/visitor present     Time: 1610-9604 PT Time Calculation (min) (ACUTE ONLY): 26 min  Charges:  $Gait Training: 8-22 mins $Therapeutic Exercise: 8-22 mins                     G CodesSunny Schlein, Chaplin [phone] 08/23/2014, 9:34 AM

## 2014-08-23 NOTE — Progress Notes (Signed)
Rehab admissions - I have received [facility] approval [facility] [employer] [employer] [facility] and received medical clearance from Dr. Lendell CapriceSullivan. We have an available rehab bed and will admit pt to inpatient rehab later today.  I called and updated pt's son who is pleased with the plan and will stop by with pt shortly to complete admission paperwork.  I called and updated Toni Amendourtney, case Warehouse managermanager and Bashira, Child psychotherapistsocial worker.  Please call me with any questions. Thanks.  Juliann MuleJanine Charlotta Lapaglia, PT Rehabilitation Admissions Coordinator [phone]

## 2014-08-23 NOTE — Progress Notes (Signed)
Rehab admissions - I met with pt and his son in follow up to rehab MD consult to give more details about our rehab program. Further questions were answered and informational brochures were given. Pt and his son are interested in pursuing inpatient rehab.  Pt has [employer] and we will need insurance authorization to consider a possible inpatient rehab admit. I have opened his case with his insurance and am now waiting on insurance determination.  I will keep the pt/family and medical team aware of any updates.   Please call me with any questions. Thanks.  Janine Turnington, PT Rehabilitation Admissions Coordinator [phone]  

## 2014-08-23 NOTE — PMR Pre-admission (Signed)
PMR Admission Coordinator Pre-Admission Assessment  Patient: Jeffery Campbell is an 81 y.o., male MRN: [id number] DOB: 11/15/1928 Height: '6\' 1"'  (185.4 cm) Weight: 113.9 kg (251 lb 1.7 oz)              Insurance Information HMO: yes    PPO:      PCP:      IPA:      80/20:      OTHER:  PRIMARY: Jeffery Campbell      Policy#: [id number]      Subscriber: self CM Name: Jeffery Purpura RN         Phone#: [phone]     Fax#: [phone] Approval given on 08-23-14 for seven days. Follow up will be with onsite reviewer Jeffery Campbell. Pre-Cert#: [id number]      Employer: retired Benefits:  Phone #: [phone]     Name: online Eff. Date: 04-02-14     Deduct: none      Out of Pocket Max: $5500 (met $10.00)       Life Max: none CIR: $275 copay/day for days 1-7, pre-auth needed      SNF: 100% days 1-20; $160/day for days 21-100, pre-auth needed Outpatient: covered     Co-Pay: $40 copay, no visit limits Home Health: 100%      Co-Pay: none DME: 80/20%     Co-Pay: none Providers: in network  Emergency Contact Information Contact Information    Name Relation Home Work Mobile   Bruni Son   [phone]   Young, Mulvey   [phone]     Current Medical History  Patient Admitting Diagnosis: right thalamic/PLIC infarct  History of Present Illness: Jeffery Campbell is a 81 y.o. male with history of HTN, OA, bradycardia, anxiety d/o;  who was admitted on 08/19/14 with left sided weakness and fall with inability to walk. CT head without acute changes. MRI/MRA brain done revealing acute ischemia right lateral thalamus v/s posterior limb of internal capsule. Carotid dopplers without significant ICA stenosis. 2D echo with EF 55-62%, no wall abnormality and grade I diastolic dysfunction. Dr. Erlinda Campbell evaluated patient and recommends changing chronic ASA to plavix for stroke likely due to SVD. EKG with frequent PVCs but negative for A fib and outpatient cardiac monitoring recommended pending full work up. Therapy  evaluations being done today and CIR recommended for follow up therapy.    NIH Total: 3  Past Medical History  Past Medical History  Diagnosis Date  . Diverticulitis   . Hypertension   . Pure hypercholesterolemia   . Hemorrhoids   . OA (osteoarthritis)   . Disturbance of sleep     Family History  family history includes Hypertension in his son.  Prior Rehab/Hospitalizations: none   Current Medications   Current facility-administered medications:  .   stroke: mapping our early stages of recovery book, , Does not apply, Once, Rise Patience, MD .  atorvastatin (LIPITOR) tablet 20 mg, 20 mg, Oral, Daily, Rise Patience, MD, 20 mg at 08/23/14 1031 .  citalopram (CELEXA) tablet 10 mg, 10 mg, Oral, Daily, Rise Patience, MD, 10 mg at 08/23/14 1030 .  clopidogrel (PLAVIX) tablet 75 mg, 75 mg, Oral, Daily, Donzetta Starch, NP, 75 mg at 08/23/14 1031 .  cyclobenzaprine (FLEXERIL) tablet 5 mg, 5 mg, Oral, QHS, Rise Patience, MD, 5 mg at 08/22/14 2111 .  diazepam (VALIUM) tablet 5 mg, 5 mg, Oral, Q8H PRN, Rise Patience, MD .  diltiazem (CARDIZEM CD) 24 hr capsule 120 mg, 120  mg, Oral, Daily, Rise Patience, MD, 120 mg at 08/23/14 1030 .  enoxaparin (LOVENOX) injection 40 mg, 40 mg, Subcutaneous, Q24H, Rise Patience, MD, 40 mg at 08/23/14 0521 .  omega-3 acid ethyl esters (LOVAZA) capsule 1 g, 1 g, Oral, Daily, Rise Patience, MD, 1 g at 08/23/14 1031 .  pantoprazole (PROTONIX) EC tablet 40 mg, 40 mg, Oral, Daily, Rise Patience, MD, 40 mg at 08/23/14 1030 .  senna-docusate (Senokot-S) tablet 1 tablet, 1 tablet, Oral, QHS PRN, Rise Patience, MD, 1 tablet at 08/21/14 2127 .  terazosin (HYTRIN) capsule 5 mg, 5 mg, Oral, QHS, Rise Patience, MD, 5 mg at 08/22/14 2111 .  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 He enjoys hanging out with his buddies and going to his church.   Home Assistive Devices / Equipment Home Assistive Devices/Equipment: Eyeglasses Home Equipment: None  Prior Functional Level Prior Function Level of Independence: Independent Comments: enjoys golfing  Current Functional Level Cognition  Overall Cognitive Status: Within Functional Limits for tasks assessed Orientation Level: Oriented X4    Extremity Assessment (includes Sensation/Coordination)  Upper Extremity Assessment: LUE deficits/detail, RUE deficits/detail RUE Deficits / Details: limited thumb flexion due to hx of injury to right hand. Is naturally right handed but learned to write with left hand due to R hand limitations LUE Deficits / Details: Strength 5/5 throughout. Coordination impaired. Hypometria with finger-to-nose; uncoordinated finger-to-thumb; negative disdiadochokinesia test. Sensation intact LUE Coordination: decreased fine motor, decreased gross motor  Lower Extremity Assessment: Defer to PT evaluation LLE Deficits / Details: 5/5 MMT RLE ---  4/5 left hip flexion, left knee extension. Abnormal Lt heel to shin test. Poor ankle proprioception/kinesthetic awareness. Normal light touch sensation BIL LEs. LLE Sensation: decreased proprioception LLE Coordination: decreased gross motor    ADLs  Overall ADL's : Needs assistance/impaired Eating/Feeding: Modified independent, Sitting Grooming: Minimal assistance, Standing Upper Body Bathing: Set up, Sitting Lower Body Bathing: Minimal assistance, Sit to/from stand Upper Body Dressing : Set up, Sitting Lower Body  Dressing: Moderate assistance, Sit to/from stand Toilet Transfer: Minimal assistance, Ambulation, RW Functional mobility during ADLs: Minimal assistance, Rolling walker General ADL Comments: Pt is eager and motivated to participate in therapy. He sat EOB without assist and required min A to stand. Provided heavy min A for balance during ambulation due to LLE ataxia. Encouraged pt to use LUE for functional activities to increase coordination and provided fine motor coordination handout.     Mobility  Overal bed mobility: Needs Assistance Bed Mobility: Supine to Sit Rolling: Min guard Sidelying to sit: Min guard, HOB elevated Supine to sit: Min guard General bed mobility comments: pt needs increased time, but is able to complete with only guarding A.      Transfers  Overall transfer level: Needs assistance Equipment used: Rolling walker (2 wheeled) Transfers: Sit to/from Stand Sit to Stand: Min assist General transfer comment: cues for Ue use and getting closer to chair prior to sitting.  Decreased L lateral lean today.      Ambulation / Gait / Stairs /  Wheelchair Mobility  Ambulation/Gait Ambulation/Gait assistance: Museum/gallery curator (Feet): 80 Feet Assistive device: Rolling walker (2 wheeled) General Gait Details: cues for safe use of RW, upright posture, increased heel strike on L, increase L LE extension in stance.  pt fatigues easily on L side and begins to lean L.  pt needed cues to stop and correct posture/balance, then continue to ambulate back to chair.   Gait Pattern/deviations: Step-to pattern, Decreased step length - left, Decreased stance time - left, Decreased dorsiflexion - left (L lateral lean) Gait velocity: decreased Gait velocity interpretation: Below normal speed for age/gender    Posture / Balance Dynamic Sitting Balance Sitting balance - Comments: Leaning left, able to self correct with effort and cues Balance Overall balance assessment: Needs  assistance Sitting-balance support: No upper extremity supported, Feet supported Sitting balance-Leahy Scale: Fair Sitting balance - Comments: Leaning left, able to self correct with effort and cues Postural control: Left lateral lean Standing balance support: Bilateral upper extremity supported, During functional activity Standing balance-Leahy Scale: Poor Standing balance comment: L lateral lean, worse with fatigue.      Special needs/care consideration BiPAP/CPAP no (pt's son believes his dad may have some issues with possible sleep apnea due to snoring and pt was on O2 at night during his acute stay.) CPM no Continuous Drip IV no  Dialysis no          Life Vest no Oxygen - on room air but was on O2 by nasal cannula at night during his acute stay Special Bed no  Trach Size no  Wound Vac (area) no        Skin - no current issues                                Bowel mgmt: last BM on 08-22-14 Bladder mgmt: currently using urinal Diabetic mgmt no   Previous Home Environment Living Arrangements: Other relatives (grandson) Available Help at Discharge: Family Type of Home: House Home Layout: One level Home Access: Level entry Home Care Services: No Additional Comments: Works at a Human resources officer for Discharge Living Setting: Patient's home, House Type of Home at Discharge: House Discharge Home Access: Stairs to enter Entrance Stairs-Rails: None Entrance Stairs-Number of Steps: one Does the patient have any problems obtaining your medications?: No (pt is hoping to return to driving. 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 Other son Jeffery Campbell is POA and lives in [place]. Anticipated Caregiver: local son Jeffery Campbell and pt's grandson Anticipated Caregiver's Contact  Information: see above Ability/Limitations of Caregiver: Pt lives with his grandson. Grandson works for his dad (pt's son named Jeffery Campbell) and both son and grandson will be checking on pt as needed. Son does work also.  Caregiver Availability: Intermittent (both pt's son and grandson work together and have [place]) Discharge Plan Discussed with Primary Caregiver: Yes Is Caregiver In Agreement with Plan?: Yes Does Caregiver/Family have Issues with Lodging/Transportation while Pt is in Rehab?: No  Goals/Additional Needs Patient/Family Goal for Rehab: Mod Ind with PT/OT; NA for SLP Expected length of stay: 7-10 days Cultural Considerations: pt enjoys his church [employer]) Dietary Needs: heart healthy Equipment Needs: to be determined Pt/Family Agrees to Admission and willing to participate: Yes (spoke with pt and his  son) Program Orientation Provided & Reviewed with Pt/Caregiver Including Roles  & Responsibilities: Yes   Decrease burden of Care through IP rehab admission: NA  Possible need for SNF placement upon discharge: not anticipated  Patient Condition: This patient's condition remains as documented in the consult dated 08-23-14, in which the Rehabilitation Physician determined and documented that the patient's condition is appropriate for intensive rehabilitative care in an inpatient rehabilitation facility. Will admit to inpatient rehab today.  Preadmission Screen Completed By: Nanetta Batty, PT, 08/23/2014 1:57 PM ______________________________________________________________________   Discussed status with Dr. Naaman Plummer on 08-23-14 at 1357 and received telephone approval for admission today.  Admission Coordinator:  Nanetta Batty, PT, time1357/Date 08-23-14

## 2014-08-23 NOTE — Progress Notes (Signed)
Ranelle Oyster, MD Physician Signed Physical Medicine and Rehabilitation Consult Note 08/20/2014 10:41 AM  Related encounter: ED to Hosp-Admission (Discharged) from 08/19/2014 in [facility]    Expand All Collapse All        Physical Medicine and Rehabilitation Consult  Reason for Consult: Left sided weakness and slurred speech.  Referring Physician: Dr. Benjamine Mola   HPI: Jeffery Campbell is a 81 y.o. male with history of HTN, OA, bradycardia, anxiety d/o; who was admitted on 08/19/14 with left sided weakness and fall with inability to walk. CT head without acute changes. MRI/MRA brain done revealing acute ischemia right lateral thalamus v/s posterior limb of internal capsule. Carotid dopplers without significant ICA stenosis. 2D echo with EF 55-62%, no wall abnormality and grade I diastolic dysfunction. Dr. Roda Shutters evaluated patient and recommends changing chronic ASA to plavix for stroke likely due to SVD. EKG with frequent PVCs but negative for A fib and outpatient cardiac monitoring recommended pending full work up. Therapy evaluations being done today and CIR recommended for follow up therapy.    Review of Systems  HENT: Negative for hearing loss.  Eyes: Negative for blurred vision and double vision.  Respiratory: Negative for cough, shortness of breath and wheezing.  Cardiovascular: Negative for chest pain and palpitations.  Gastrointestinal: Negative for abdominal pain and constipation.  Genitourinary: Negative for urgency and frequency.  Musculoskeletal: Negative for back pain and joint pain.  Neurological: Positive for sensory change, speech change and focal weakness. Negative for dizziness and headaches.      Past Medical History  Diagnosis Date  . Diverticulitis   . Hypertension   . Pure hypercholesterolemia   . Hemorrhoids   . OA (osteoarthritis)   . Disturbance of sleep     Past Surgical History  Procedure  Laterality Date  . Transurethral resection of prostate    . Knee arthroscopy    . Colonoscopy      Family History  Problem Relation Age of Onset  . Hypertension Son     Social History: Lives with grandson. 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 He reports that he has never smoked. He does not have any smokeless tobacco history on file. He reports that he does not drink alcohol or use illicit drugs.   Allergies: No Known Allergies    Medications Prior to Admission  Medication Sig Dispense Refill  . aspirin 81 MG tablet Take 81 mg by mouth daily.    [name] atorvastatin (LIPITOR) 20 MG tablet Take 20 mg by mouth daily.    . citalopram (CELEXA) 10 MG tablet Take 10 mg by mouth daily.    . cyclobenzaprine (FLEXERIL) 5 MG tablet Take 5 mg by mouth at bedtime.    . diazepam (VALIUM) 5 MG tablet Take 5 mg by mouth every 8 (eight) hours as needed for anxiety.    [name] diltiazem (CARDIZEM CD) 120 MG 24 hr capsule Take 1 capsule (120 mg total) by mouth daily. 90 capsule 3  . lansoprazole (PREVACID) 30 MG capsule Take 30 mg by mouth daily at 12 noon.    . meloxicam (MOBIC) 15 MG tablet Take 15 mg by mouth daily.  2  . Omega 3 1200 MG CAPS Take by mouth daily.    [name] terazosin (HYTRIN) 5 MG capsule Take 5 mg by mouth at bedtime.    . vitamin B-12 (CYANOCOBALAMIN) 1000 MCG tablet Take 2,000 mcg by mouth daily.  Home: Home Living Family/patient expects to be discharged to:: Inpatient rehab Living Arrangements: Other (Comment), Other relatives (grandson works part time) Available Help at Discharge: Family, Available PRN/intermittently (grandson works part time) Type of Home: House Home Access: Level entry Home Layout: One level Home Equipment: None Additional Comments: Works at a [employer]  Functional History: Prior Function Level of Independence:  Independent Functional Status:  Mobility: Bed Mobility Overal bed mobility: Needs Assistance Bed Mobility: Rolling, Sidelying to Sit Rolling: Min guard Sidelying to sit: Min guard, HOB elevated General bed mobility comments: Min guard for safety. VC for technique, requires extra time. Transfers Overall transfer level: Needs assistance Equipment used: Rolling walker (2 wheeled) Transfers: Sit to/from Stand Sit to Stand: Min assist General transfer comment: min assist for boost to stand. No buckling noted of LLE. Cues for positioning of LLE. Leans to Left upon standing. Slow to rise. Ambulation/Gait Ambulation/Gait assistance: Min assist Ambulation Distance (Feet): 45 Feet Assistive device: Rolling walker (2 wheeled) General Gait Details: Educated on safe DME use with a rolling walker. Ataxia of LLE requiring tactile cues for foot placement, VC for upright posture, and facilitory techniques for Lt hip extension in Lt stance phase. No buckling noted however assist required to block left lateral lean. Poor Lt foot clearance in swing phase. Gait Pattern/deviations: Step-to pattern, Decreased step length - right, Decreased stance time - left, Decreased dorsiflexion - left, Decreased weight shift to right, Ataxic, Trunk flexed, Wide base of support (Leaning left) Gait velocity: decreased Gait velocity interpretation: Below normal speed for age/gender    ADL:    Cognition: Cognition Overall Cognitive Status: Within Functional Limits for tasks assessed Orientation Level: Oriented X4 Cognition Arousal/Alertness: Awake/alert Behavior During Therapy: WFL for tasks assessed/performed Overall Cognitive Status: Within Functional Limits for tasks assessed   Blood pressure 153/71, pulse 58, temperature 97.7 F (36.5 C), temperature source Oral, resp. rate 18, height 6\' 1"  (1.854 m), weight 113.9 kg (251 lb 1.7 oz), SpO2 93 %. Physical Exam  Nursing note and vitals reviewed. Constitutional:  He is oriented to person, place, and time. He appears well-developed and well-nourished.  HENT:  Head: Normocephalic and atraumatic.  Eyes: Conjunctivae are normal. Pupils are equal, round, and reactive to light.  Neck: Normal range of motion. Neck supple.  Cardiovascular: Normal rate and regular rhythm.  Respiratory: Effort normal and breath sounds normal. No respiratory distress. He has no wheezes.  GI: Soft. Bowel sounds are normal. He exhibits no distension. There is no tenderness.  Musculoskeletal: He exhibits no edema or tenderness.  Neurological: He is alert and oriented to person, place, and time. He has normal reflexes.  Left facial weakness with Mild dysarthria. Follows one and two step commands without difficulty LUE and LLE proprioceptive deficits. . Left upper and left lower limb ataxia with fine movements.. Motor: 4+/5 left deltoid, bicep, tricep, hi, hip, knee, ankle.  Skin: Skin is warm and dry.  Psychiatric: He has a normal mood and affect. His behavior is normal. Judgment and thought content normal.     Lab Results Last 24 Hours    No results found for this or any previous visit (from the past 24 hour(s)).   Dg Chest 2 View  08/19/2014 CLINICAL DATA: Stroke. EXAM: CHEST 2 VIEW COMPARISON: None. FINDINGS: Mediastinum and hilar structures are normal. The lungs are clear of acute infiltrates. Cardiomegaly with normal pulmonary vascularity. No pleural effusion. Bilateral apical mild pleural thickening is noted. No definite pneumothorax. Lung markings are noted distal to the apical  pleural thickening . Degenerative changes thoracic spine. IMPRESSION: 1. Cardiomegaly. No pulmonary venous congestion. 2. No focal pulmonary infiltrate . Electronically Signed By: Maisie Fus Register On: 08/19/2014 07:53   Ct Head Wo Contrast  08/19/2014 CLINICAL DATA: Code stroke. Left-sided weakness and facial droop. Status post fall. Initial encounter. EXAM: CT HEAD WITHOUT  CONTRAST TECHNIQUE: Contiguous axial images were obtained from the base of the skull through the vertex without intravenous contrast. COMPARISON: None. FINDINGS: There is no evidence of acute infarction, mass lesion, or intra- or extra-axial hemorrhage on CT. Mild mega cisterna magna is noted. Cerebellar atrophy is noted. Scattered periventricular and subcortical white matter change likely reflects small vessel ischemic microangiopathy. The brainstem and fourth ventricle are within normal limits. The basal ganglia are unremarkable in appearance. The cerebral hemispheres demonstrate grossly normal gray-white differentiation. No mass effect or midline shift is seen. There is no evidence of fracture; visualized osseous structures are unremarkable in appearance. The orbits are within normal limits. The paranasal sinuses and mastoid air cells are well-aerated. No significant soft tissue abnormalities are seen. IMPRESSION: 1. No evidence of traumatic intracranial injury or fracture. 2. Scattered small vessel ischemic microangiopathy and cerebellar atrophy noted. 3. Mild mega cisterna magna noted. These results were called by telephone at the time of interpretation on 08/19/2014 at 2:37 am to Dr. Roseanne Reno, who verbally acknowledged these results. Electronically Signed By: Roanna Raider M.D. On: 08/19/2014 02:37   Mr Brain Wo Contrast  08/19/2014 CLINICAL DATA: Awoke at 1 a.m. with LEFT-sided weakness, fell while walking to bathroom. History of hypertension, chronic kidney disease, hypercholesterolemia. EXAM: MRI HEAD WITHOUT CONTRAST MRA HEAD WITHOUT CONTRAST TECHNIQUE: Multiplanar, multiecho pulse sequences of the brain and surrounding structures were obtained without intravenous contrast. Angiographic images of the head were obtained using MRA technique without contrast. COMPARISON: CT of the head Aug 19, 2014 at 2:34 a.m. FINDINGS: MRI HEAD FINDINGS Faint 15 x 9 mm area of reduced diffusion  within RIGHT lateral thalamus versus posterior limb of the internal capsule with corresponding low ADC values. No susceptibility artifact to suggest hemorrhage. The ventricles and sulci are normal for patient's age. Minimal white matter changes are less than expected for age, suggesting chronic small vessel ischemic disease. No abnormal extra-axial fluid collections. Small posterior posterior fossa arachnoid cyst. Ocular globes and orbital contents are unremarkable. Mild LEFT maxillary sinus mucosal thickening without air-fluid levels. The mastoid air cells appear well-aerated. Mild RIGHT temporomandibular osteoarthrosis. No abnormal sellar expansion. No cerebellar tonsillar ectopia. No suspicious calvarial bone marrow signal. MRA HEAD FINDINGS Anterior circulation: Normal flow related enhancement of the included cervical, petrous, cavernous and supra clinoid internal carotid arteries. Patent anterior communicating artery. Normal flow related enhancement of the anterior and middle cerebral arteries, including more distal segments. No large vessel occlusion, high-grade stenosis, abnormal luminal irregularity, aneurysm. Posterior circulation: LEFT vertebral artery is dominant. Basilar artery is patent, with normal flow related enhancement of the main branch vessels. Fetal origin LEFT posterior cerebral artery. Normal flow related enhancement of the posterior cerebral arteries. Very mild stenosis LEFT P2 segment. No large vessel occlusion, high-grade stenosis, abnormal luminal irregularity, aneurysm. IMPRESSION: MRI HEAD: Acute 15 x 9 mm ischemia RIGHT lateral thalamus versus posterior limb of the internal capsule. Otherwise normal MRI of the brain without contrast for age. MRA HEAD: Very mild stenosis LEFT P2 segment without large vessel occlusion or high-grade stenosis. Electronically Signed By: Awilda Metro On: 08/19/2014 06:38    Assessment/Plan: Diagnosis: right thalamic/PLIC  infarct 1. Does the need for close,  24 hr/day medical supervision in concert with the patient's rehab needs make it unreasonable for this patient to be served in a less intensive setting? Yes 2. Co-Morbidities requiring supervision/potential complications: htn, obesity, ckd 3. Due to bladder management, bowel management, safety, skin/wound care, disease management, medication administration, pain management and patient education, does the patient require 24 hr/day rehab nursing? Yes 4. Does the patient require coordinated care of a physician, rehab nurse, PT (1-2 hrs/day, 5 days/week) and OT (1-2 hrs/day, 5 days/week) to address physical and functional deficits in the context of the above medical diagnosis(es)? Yes Addressing deficits in the following areas: balance, endurance, locomotion, strength, transferring, bowel/bladder control, bathing, dressing, feeding, grooming and toileting 5. Can the patient actively participate in an intensive therapy program of at least 3 hrs of therapy per day at least 5 days per week? Yes 6. The potential for patient to make measurable gains while on inpatient rehab is excellent 7. Anticipated functional outcomes upon discharge from inpatient rehab are modified independent with PT, modified independent with OT, n/a with SLP. 8. Estimated rehab length of stay to reach the above functional goals is: 7-10 days 9. Does the patient have adequate social supports and living environment to accommodate these discharge functional goals? Yes 10. Anticipated D/C setting: Home 11. Anticipated post D/C treatments: HH therapy and Outpatient therapy 12. Overall Rehab/Functional Prognosis: excellent  RECOMMENDATIONS: This patient's condition is appropriate for continued rehabilitative care in the following setting: CIR Patient has agreed to participate in recommended program. Yes Note that insurance prior authorization may be required for reimbursement for recommended  care.  Comment: Pt quite motivated; independent and active PTA. Rehab Admissions Coordinator to follow up.  Thanks,  Ranelle Oyster, MD, [place] [facility]     08/20/2014       Revision History     Date/Time User Provider Type Action   08/23/2014 9:24 AM Ranelle Oyster, MD Physician Sign   08/20/2014 2:07 PM Jacquelynn Cree, PA-C Physician Assistant Share   View Details Report       Routing History     Date/Time From To Method   08/23/2014 9:24 AM Ranelle Oyster, MD Ranelle Oyster, MD In Basket   08/23/2014 9:24 AM Ranelle Oyster, MD Dennison Mascot, MD Fax

## 2014-08-23 NOTE — Discharge Summary (Signed)
Physician Discharge Summary  Jeffery Campbell MHD:[id number] DOB: 1928-10-19 DOA: 08/19/2014  PCP: Dennison Mascot, MD  Admit date: 08/19/2014 Discharge date: 08/23/2014  Time spent: greater than 30 minutes  Recommendations for Outpatient Follow-up:  1. To inpatient rehab 2. Needs 30 day event monitor after discharge from rehab to r/o atrial fibrillation/flutter per neurology recommendations  Discharge Diagnoses:  Principal Problem:   Acute ischemic Stroke, likely small vessel disease, but knee is 30 day event monitor as an outpatient to rule out atrial fibrillation/flutter Active Problems:   Hypertension   HLD (hyperlipidemia)   CKD (chronic kidney disease) stage 3, GFR 30-59 ml/min   Accelerated hypertension  Discharge Condition: Stable  Diet recommendation: Heart healthy  Filed Weights   08/19/14 0250 08/19/14 0416  Weight: 111.358 kg (245 lb 8 oz) 113.9 kg (251 lb 1.7 oz)    History of present illness:  Jeffery Campbell is an 81 y.o. male history of hypertension and hyperlipidemia presenting with new onset weakness involving left face as well as extremities. Patient was last seen well at 9:30 PM 08/18/2014 when he went to bed. He fell when he attempted to get out of bed at about 1 AM. Is no previous history of stroke nor TIA. He's been taking aspirin 81 mg per day. CT scan of his head showed no acute intracranial abnormality. NIH stroke score was 5. Patient was not administered TPA secondary to Beyond time under for treatment consideration. He was admitted for further evaluation and treatment.  Hospital Course:  Admitted to hospitalist service, telemetry. Neurology consulted. MRI of the brain showed Acute 15 x 9 mm ischemia RIGHT lateral thalamus versus posterior limb of the internal capsule.  Neurology felt this was likely small vessel disease, but recommend 30 day event monitor post discharge to rule out atrial fibrillation/flutter.   Resultant Left side hemiparesis and slight dysarthria  are improving.  Patient has worked with physical therapy occupational therapy and speech therapy. Will be going to inpatient rehabilitation today.  MRA unremarkable for critical stenosis or occlusion.  Carotid Doppler Bilateral: Minimal disease  2D Echo unremarkable  LDL 74  HgbA1c - 5.6  Patient was on aspirin 81 mg daily. Neurology recommended changing to Plavix.  Recommend OP 30-d tele monitoring to rule out possible atrial fibrillation (recent 48h holter monitor neg for atrial fibrillation)  Hypertension, accelerated  Home meds: Cardizem, hytrin resume. Controlled by discharge.  Hyperlipidemia  Home meds: lipitor 20, omega 3, lipitor resumed in hospital  LDL 74, goal < 70  Continue statin at discharge  Procedures:  None  Consultations:  Neurology  Discharge Exam: Filed Vitals:   08/23/14 1313  BP: 139/88  Pulse: 67  Temp: 98 F (36.7 C)  Resp: 18    General: Alert, oriented. Speech clear and fluent. Cardiovascular: Regular rate rhythm without murmurs gallops rubs Respiratory: Clear to auscultation bilaterally without wheezes rhonchi or rales Neurologic: Cranial nerves intact. Sensation intact. Slight drift left upper and lower extremity.  Discharge Instructions   Discharge Instructions    Ambulatory referral to Neurology    Complete by:  As directed   Pt will follow up with Dr. Roda Shutters at [facility] in about 2 months. Thanks.     Diet - low sodium heart healthy    Complete by:  As directed           Current Discharge Medication List    START taking these medications   Details  clopidogrel (PLAVIX) 75 MG tablet Take 1 tablet (75 mg total) by mouth  daily.      CONTINUE these medications which have NOT CHANGED   Details  atorvastatin (LIPITOR) 20 MG tablet Take 20 mg by mouth daily.    citalopram (CELEXA) 10 MG tablet Take 10 mg by mouth daily.    cyclobenzaprine (FLEXERIL) 5 MG tablet Take 5 mg by mouth at bedtime.    diazepam (VALIUM) 5 MG  tablet Take 5 mg by mouth every 8 (eight) hours as needed for anxiety.    diltiazem (CARDIZEM CD) 120 MG 24 hr capsule Take 1 capsule (120 mg total) by mouth daily. Qty: 90 capsule, Refills: 3    lansoprazole (PREVACID) 30 MG capsule Take 30 mg by mouth daily at 12 noon.    Omega 3 1200 MG CAPS Take by mouth daily.    terazosin (HYTRIN) 5 MG capsule Take 5 mg by mouth at bedtime.    vitamin B-12 (CYANOCOBALAMIN) 1000 MCG tablet Take 2,000 mcg by mouth daily.       STOP taking these medications     aspirin 81 MG tablet      meloxicam (MOBIC) 15 MG tablet        No Known Allergies Follow-up Information    Follow up with Xu,Jindong, MD. Schedule an appointment as soon as possible for a visit in 2 months.   Specialty:  Neurology   Why:  stroke clinic   Contact information:   [address] [place] [address] [phone]        The results of significant diagnostics from this hospitalization (including imaging, microbiology, ancillary and laboratory) are listed below for reference.    Significant Diagnostic Studies: Dg Chest 2 View  08/19/2014   CLINICAL DATA:  Stroke.  EXAM: CHEST  2 VIEW  COMPARISON:  None.  FINDINGS: Mediastinum and hilar structures are normal. The lungs are clear of acute infiltrates. Cardiomegaly with normal pulmonary vascularity. No pleural effusion. Bilateral apical mild pleural thickening is noted. No definite pneumothorax. Lung markings are noted distal to the apical pleural thickening . Degenerative changes thoracic spine.  IMPRESSION: 1.  Cardiomegaly.  No pulmonary venous congestion.  2. No focal pulmonary infiltrate .   Electronically Signed   By: Maisie Fus  Register   On: 08/19/2014 07:53   Ct Head Wo Contrast  08/19/2014   CLINICAL DATA:  Code stroke. Left-sided weakness and facial droop. Status post fall. Initial encounter.  EXAM: CT HEAD WITHOUT CONTRAST  TECHNIQUE: Contiguous axial images were obtained from the base of the skull  through the vertex without intravenous contrast.  COMPARISON:  None.  FINDINGS: There is no evidence of acute infarction, mass lesion, or intra- or extra-axial hemorrhage on CT.  Mild mega cisterna magna is noted. Cerebellar atrophy is noted. Scattered periventricular and subcortical white matter change likely reflects small vessel ischemic microangiopathy.  The brainstem and fourth ventricle are within normal limits. The basal ganglia are unremarkable in appearance. The cerebral hemispheres demonstrate grossly normal gray-white differentiation. No mass effect or midline shift is seen.  There is no evidence of fracture; visualized osseous structures are unremarkable in appearance. The orbits are within normal limits. The paranasal sinuses and mastoid air cells are well-aerated. No significant soft tissue abnormalities are seen.  IMPRESSION: 1. No evidence of traumatic intracranial injury or fracture. 2. Scattered small vessel ischemic microangiopathy and cerebellar atrophy noted. 3. Mild mega cisterna magna noted.  These results were called by telephone at the time of interpretation on 08/19/2014 at 2:37 am to Dr. Roseanne Reno, who verbally acknowledged  these results.   Electronically Signed   By: Roanna RaiderJeffery  Chang M.D.   On: 08/19/2014 02:37   Mr Brain Wo Contrast  08/19/2014   CLINICAL DATA:  Awoke at 1 a.m. with LEFT-sided weakness, fell while walking to bathroom. History of hypertension, chronic kidney disease, hypercholesterolemia.  EXAM: MRI HEAD WITHOUT CONTRAST  MRA HEAD WITHOUT CONTRAST  TECHNIQUE: Multiplanar, multiecho pulse sequences of the brain and surrounding structures were obtained without intravenous contrast. Angiographic images of the head were obtained using MRA technique without contrast.  COMPARISON:  CT of the head Aug 19, 2014 at 2:34 a.m.  FINDINGS: MRI HEAD FINDINGS  Faint 15 x 9 mm area of reduced diffusion within RIGHT lateral thalamus versus posterior limb of the internal capsule with  corresponding low ADC values. No susceptibility artifact to suggest hemorrhage. The ventricles and sulci are normal for patient's age. Minimal white matter changes are less than expected for age, suggesting chronic small vessel ischemic disease.  No abnormal extra-axial fluid collections. Small posterior posterior fossa arachnoid cyst. Ocular globes and orbital contents are unremarkable. Mild LEFT maxillary sinus mucosal thickening without air-fluid levels. The mastoid air cells appear well-aerated. Mild RIGHT temporomandibular osteoarthrosis. No abnormal sellar expansion. No cerebellar tonsillar ectopia. No suspicious calvarial bone marrow signal.  MRA HEAD FINDINGS  Anterior circulation: Normal flow related enhancement of the included cervical, petrous, cavernous and supra clinoid internal carotid arteries. Patent anterior communicating artery. Normal flow related enhancement of the anterior and middle cerebral arteries, including more distal segments.  No large vessel occlusion, high-grade stenosis, abnormal luminal irregularity, aneurysm.  Posterior circulation: LEFT vertebral artery is dominant. Basilar artery is patent, with normal flow related enhancement of the main branch vessels. Fetal origin LEFT posterior cerebral artery. Normal flow related enhancement of the posterior cerebral arteries. Very mild stenosis LEFT P2 segment.  No large vessel occlusion, high-grade stenosis, abnormal luminal irregularity, aneurysm.  IMPRESSION: MRI HEAD: Acute 15 x 9 mm ischemia RIGHT lateral thalamus versus posterior limb of the internal capsule.  Otherwise normal MRI of the brain without contrast for age.  MRA HEAD: Very mild stenosis LEFT P2 segment without large vessel occlusion or high-grade stenosis.   Electronically Signed   By: Awilda Metroourtnay  Bloomer   On: 08/19/2014 06:38   Mr Maxine GlennMra Head/brain Wo Cm  08/19/2014   CLINICAL DATA:  Awoke at 1 a.m. with LEFT-sided weakness, fell while walking to bathroom. History of  hypertension, chronic kidney disease, hypercholesterolemia.  EXAM: MRI HEAD WITHOUT CONTRAST  MRA HEAD WITHOUT CONTRAST  TECHNIQUE: Multiplanar, multiecho pulse sequences of the brain and surrounding structures were obtained without intravenous contrast. Angiographic images of the head were obtained using MRA technique without contrast.  COMPARISON:  CT of the head Aug 19, 2014 at 2:34 a.m.  FINDINGS: MRI HEAD FINDINGS  Faint 15 x 9 mm area of reduced diffusion within RIGHT lateral thalamus versus posterior limb of the internal capsule with corresponding low ADC values. No susceptibility artifact to suggest hemorrhage. The ventricles and sulci are normal for patient's age. Minimal white matter changes are less than expected for age, suggesting chronic small vessel ischemic disease.  No abnormal extra-axial fluid collections. Small posterior posterior fossa arachnoid cyst. Ocular globes and orbital contents are unremarkable. Mild LEFT maxillary sinus mucosal thickening without air-fluid levels. The mastoid air cells appear well-aerated. Mild RIGHT temporomandibular osteoarthrosis. No abnormal sellar expansion. No cerebellar tonsillar ectopia. No suspicious calvarial bone marrow signal.  MRA HEAD FINDINGS  Anterior circulation: Normal flow related enhancement of  the included cervical, petrous, cavernous and supra clinoid internal carotid arteries. Patent anterior communicating artery. Normal flow related enhancement of the anterior and middle cerebral arteries, including more distal segments.  No large vessel occlusion, high-grade stenosis, abnormal luminal irregularity, aneurysm.  Posterior circulation: LEFT vertebral artery is dominant. Basilar artery is patent, with normal flow related enhancement of the main branch vessels. Fetal origin LEFT posterior cerebral artery. Normal flow related enhancement of the posterior cerebral arteries. Very mild stenosis LEFT P2 segment.  No large vessel occlusion, high-grade  stenosis, abnormal luminal irregularity, aneurysm.  IMPRESSION: MRI HEAD: Acute 15 x 9 mm ischemia RIGHT lateral thalamus versus posterior limb of the internal capsule.  Otherwise normal MRI of the brain without contrast for age.  MRA HEAD: Very mild stenosis LEFT P2 segment without large vessel occlusion or high-grade stenosis.   Electronically Signed   By: Awilda Metro   On: 08/19/2014 06:38   Echo Left ventricle: The cavity size was normal. Wall thickness was increased in a pattern of mild LVH. Systolic function was normal. The estimated ejection fraction was in the range of 55% to 60%. Doppler parameters are consistent with abnormal left ventricular relaxation (grade 1 diastolic dysfunction). - Aortic valve: There was mild regurgitation. - Mitral valve: There was mild regurgitation. - Pericardium, extracardiac: A trivial pericardial effusion was identified.  Carotid ultrasound  - The right vertebral artery appears patent with antegrade flow. - Findings consistent with 1-39 percent stenosis involving the right internal carotid artery and the left internal carotid artery  EKG Sinus rhythm Multiform ventricular premature complexes Probable left atrial enlargement Left anterior fascicular block  Microbiology: No results found for this or any previous visit (from the past 240 hour(s)).   Labs: Basic Metabolic Panel:  Recent Labs Lab 08/19/14 0227 08/19/14 0230 08/19/14 0831 08/23/14 0519  NA 139 142 138 140  K 3.5 3.4* 3.8 3.9  CL 108 106 108 106  CO2 21*  --  21* 26  GLUCOSE 136* 138* 127* 104*  BUN CREATININE 1.30* 1.20 1.05 1.27*  CALCIUM 8.5*  --  8.6* 8.8*   Liver Function Tests:  Recent Labs Lab 08/19/14 0227 08/19/14 0831  AST 20 18  ALT 17 17  ALKPHOS 51 47  BILITOT 1.0 1.3*  PROT 6.3* 6.1*  ALBUMIN 3.7 3.5   No results for input(s): LIPASE, AMYLASE in the last 168 hours. No results for input(s): AMMONIA in the last  168 hours. CBC:  Recent Labs Lab 08/19/14 0227 08/19/14 0230 08/19/14 0831 08/23/14 0519  WBC 6.1  --  9.1 7.8  NEUTROABS 4.0  --  7.5  --   HGB 16.2 17.7* 16.8 16.8  HCT 47.6 52.0 48.1 47.3  MCV 90.2  --  88.9 89.6  PLT 171  --  180 177   Cardiac Enzymes: No results for input(s): CKTOTAL, CKMB, CKMBINDEX, TROPONINI in the last 168 hours. BNP: BNP (last 3 results) No results for input(s): BNP in the last 8760 hours.  ProBNP (last 3 results) No results for input(s): PROBNP in the last 8760 hours.  CBG:  Recent Labs Lab 08/19/14 0248  GLUCAP 119*       Signed:  [place] Karl L  [facility] 08/23/2014, 1:57 PM

## 2014-08-23 NOTE — Progress Notes (Signed)
Patient admitted to [facility] via wheelchair escorted by nursing staff and family. Patient verbalized understanding of rehab process, and signed fall safety agreement. Patient appears to be in no immediate distress at this time. Will continue to monitor. Jeffery Campbell L [name].

## 2014-08-23 NOTE — Progress Notes (Signed)
Occupational Therapy Treatment Patient Details Name: Jeffery Campbell MRN: [id number] DOB: 1928-05-10 Today's Date: 08/23/2014    History of present illness 81 y.o. male admitted for left sided weakness. MRI reveals Acute ischemia RIGHT lateral thalamus versus posterior limb of the internal capsule.   OT comments  Pt has just received insurance approval to CIR and will transfer today. Ot to continue to work on balance with ADLs for adl retraining. Pt demonstrates L lateral lean with decr awareness.   Follow Up Recommendations  CIR;Supervision/Assistance - 24 hour    Equipment Recommendations  None recommended by OT    Recommendations for Other Services      Precautions / Restrictions Precautions Precautions: Fall       Mobility Bed Mobility Overal bed mobility: Needs Assistance Bed Mobility: Supine to Sit Rolling: Min guard   Supine to sit: Min guard     General bed mobility comments: exiting bed on L side with incr time and effort. pt needed incr time to problem solve transition  Transfers Overall transfer level: Needs assistance Equipment used: Rolling walker (2 wheeled) Transfers: Sit to/from Stand Sit to Stand: Min assist         General transfer comment: cues for hand placement    Balance Overall balance assessment: Needs assistance Sitting-balance support: Feet supported;Bilateral upper extremity supported Sitting balance-Leahy Scale: Fair       Standing balance-Leahy Scale: Poor Standing balance comment: lateral left lean. Pt when questioned states "i hope I am not leaning"                   ADL Overall ADL's : Needs assistance/impaired     Grooming: Min guard;Standing Grooming Details (indicate cue type and reason): cues for posture, supported UE             Lower Body Dressing: Min guard;Sitting/lateral leans Lower Body Dressing Details (indicate cue type and reason): static sitting to adjust L sock  Toilet Transfer: Minimal  assistance;RW;BSC Toilet Transfer Details (indicate cue type and reason): cues for hand placement and to release RW to descend         Functional mobility during ADLs: Minimal assistance;Rolling walker General ADL Comments: pt eager to participate and willing to work on anythign that "makes me better" . pt with uncontrolled descend to surfaces and poor hand placement this session.      Vision                     Perception     Praxis      Cognition   Behavior During Therapy: WFL for tasks assessed/performed Overall Cognitive Status: Within Functional Limits for tasks assessed                       Extremity/Trunk Assessment               Exercises     Shoulder Instructions       General Comments      Pertinent Vitals/ Pain       Pain Assessment: No/denies pain  Home Living                                          Prior Functioning/Environment              Frequency Min 3X/week     Progress Toward Goals  OT  Goals(current goals can now be found in the care plan section)  Progress towards OT goals: Progressing toward goals  Acute Rehab OT Goals Patient Stated Goal: Go to rehab; return to golf OT Goal Formulation: With patient Time For Goal Achievement: 09/04/14 Potential to Achieve Goals: Good ADL Goals Pt Will Perform Grooming: with supervision;standing Pt Will Perform Lower Body Bathing: with supervision;sit to/from stand Pt Will Perform Lower Body Dressing: with supervision;sit to/from stand Pt Will Transfer to Toilet: with supervision;ambulating Pt Will Perform Toileting - Clothing Manipulation and hygiene: with supervision;sit to/from stand Pt/caregiver will Perform Home Exercise Program: Left upper extremity;With Supervision;With written HEP provided  Plan Discharge plan remains appropriate    Co-evaluation                 End of Session Equipment Utilized During Treatment: Gait belt;Rolling  walker   Activity Tolerance Patient tolerated treatment well   Patient Left in bed;with call bell/phone within reach;Other (comment) (MD in room so bed alarm not set so MD can assess at bed)   Nurse Communication Mobility status;Precautions        Time: [id number] OT Time Calculation (min): 17 min  Charges: OT General Charges $OT Visit: 1 Procedure OT Treatments $Self Care/Home Management : 8-22 mins  Boone MasterJones, Vannie Hochstetler B 08/23/2014, 3:09 PM Pager: [phone]

## 2014-08-24 ENCOUNTER — Inpatient Hospital Stay (HOSPITAL_COMMUNITY): Payer: Commercial Managed Care - HMO

## 2014-08-24 ENCOUNTER — Inpatient Hospital Stay (HOSPITAL_COMMUNITY): Payer: Commercial Managed Care - HMO | Admitting: Physical Therapy

## 2014-08-24 LAB — CBC WITH DIFFERENTIAL/PLATELET
BASOS ABS: 0 10*3/uL (ref 0.0–0.1)
BASOS PCT: 0 % (ref 0–1)
EOS ABS: 0.3 10*3/uL (ref 0.0–0.7)
Eosinophils Relative: 4 % (ref 0–5)
HCT: 48.9 % (ref 39.0–52.0)
Hemoglobin: 17.3 g/dL — ABNORMAL HIGH (ref 13.0–17.0)
LYMPHS ABS: 1.6 10*3/uL (ref 0.7–4.0)
LYMPHS PCT: 20 % (ref 12–46)
MCH: 31.8 pg (ref 26.0–34.0)
MCHC: 35.4 g/dL (ref 30.0–36.0)
MCV: 89.9 fL (ref 78.0–100.0)
MONO ABS: 0.7 10*3/uL (ref 0.1–1.0)
Monocytes Relative: 9 % (ref 3–12)
NEUTROS ABS: 5.3 10*3/uL (ref 1.7–7.7)
NEUTROS PCT: 67 % (ref 43–77)
Platelets: 181 10*3/uL (ref 150–400)
RBC: 5.44 MIL/uL (ref 4.22–5.81)
RDW: 12.3 % (ref 11.5–15.5)
WBC: 7.9 10*3/uL (ref 4.0–10.5)

## 2014-08-24 LAB — COMPREHENSIVE METABOLIC PANEL
ALBUMIN: 3.5 g/dL (ref 3.5–5.0)
ALT: 21 U/L (ref 17–63)
ANION GAP: 8 (ref 5–15)
AST: 17 U/L (ref 15–41)
Alkaline Phosphatase: 45 U/L (ref 38–126)
BUN: 19 mg/dL (ref 6–20)
CO2: 25 mmol/L (ref 22–32)
Calcium: 9.1 mg/dL (ref 8.9–10.3)
Chloride: 105 mmol/L (ref 101–111)
Creatinine, Ser: 1.27 mg/dL — ABNORMAL HIGH (ref 0.61–1.24)
GFR calc non Af Amer: 50 mL/min — ABNORMAL LOW (ref >60)
GFR, EST AFRICAN AMERICAN: 59 mL/min — AB (ref >60)
Glucose, Bld: 103 mg/dL — ABNORMAL HIGH (ref 65–99)
Potassium: 3.9 mmol/L (ref 3.5–5.1)
SODIUM: 138 mmol/L (ref 135–145)
Total Bilirubin: 1.7 mg/dL — ABNORMAL HIGH (ref 0.3–1.2)
Total Protein: 6.3 g/dL — ABNORMAL LOW (ref 6.5–8.1)

## 2014-08-24 NOTE — Evaluation (Signed)
Physical Therapy Assessment and Plan  Patient Details  Name: Jeffery Campbell MRN: [id number] Date of Birth: [birth year]  PT Diagnosis: Abnormality of gait, Ataxia, Ataxic gait, Coordination disorder, Hemiplegia non-dominant and Muscle weakness Rehab Potential: Good ELOS: 7-10 days   Today's Date: 08/24/2014 PT Individual Time: 0800-0900 and 1400-1445 PT Individual Time Calculation (min): 60 min and 45 min   Problem List:  Patient Active Problem List   Diagnosis Date Noted  . Thalamic infarct, acute   . Left hemiparesis   . CVA (cerebral infarction) 08/19/2014  . Stroke 08/19/2014  . Hypertension 08/19/2014  . HLD (hyperlipidemia) 08/19/2014  . CKD (chronic kidney disease) stage 3, GFR 30-59 ml/min 08/19/2014  . Accelerated hypertension   . 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 Obesity   . Asymptomatic PVCs 05/27/2014  . Bradycardia 04/05/2014  . Essential hypertension 04/05/2014    Past Medical History:  Past Medical History  Diagnosis Date  . Diverticulitis   . Hypertension   . Pure hypercholesterolemia   . Hemorrhoids   . OA (osteoarthritis)   . Disturbance of sleep    Past Surgical History:  Past Surgical History  Procedure Laterality Date  . Transurethral resection of prostate    . Knee arthroscopy    . Colonoscopy      Assessment & Plan Clinical Impression: Jeffery Campbell is a 81 y.o. male with history of HTN, OA, bradycardia, anxiety d/o; who was admitted on 08/19/14 with left sided weakness and fall with inability to walk. CT head without acute changes. MRI/MRA brain done revealing acute ischemia right lateral thalamus v/s posterior limb of internal capsule. Carotid dopplers without significant ICA stenosis. 2D echo with EF 55-62%, no wall abnormality and grade I diastolic dysfunction. Dr. Erlinda Hong evaluated patient and recommends changing chronic ASA to plavix for stroke likely due to SVD. EKG with frequent PVCs but negative for A fib and  outpatient cardiac monitoring arranged with cardiology. Carotid dopplers without significant ICA stenosis. Patient with resultant left sided weakness and mild flaccid dysarthria. Patient transferred to CIR on 08/23/2014.   Patient currently requires min to mod with mobility secondary to muscle weakness, decreased cardiorespiratoy endurance, impaired timing and sequencing, abnormal tone, unbalanced muscle activation, ataxia and decreased coordination, decreased attention to left and decreased memory.  Prior to hospitalization, patient was independent  with mobility and lived with Other (Comment) Yolanda Bonine, Nicole Kindred) in a House home.  Home access is  Level entry.  Patient will benefit from skilled PT intervention to maximize safe functional mobility, minimize fall risk and decrease caregiver burden for planned discharge home with intermittent assist.  Anticipate patient will benefit from follow up OP at discharge.  PT - End of Session Activity Tolerance: Decreased this session;Tolerates 30+ min activity with multiple rests Endurance Deficit: Yes Endurance Deficit Description: requires rest breaks with mobility PT Assessment Rehab Potential (ACUTE/IP ONLY): Good Barriers to Discharge: Decreased caregiver support PT Patient demonstrates impairments in the following area(s): Balance;Endurance;Motor;Perception;Safety PT Transfers Functional Problem(s): Bed Mobility;Bed to Chair;Car;Furniture;Floor PT Locomotion Functional Problem(s): Ambulation;Wheelchair Mobility;Stairs PT Plan PT Intensity: Minimum of 1-2 x/day ,45 to 90 minutes PT Frequency: 5 out of 7 days PT Duration Estimated Length of Stay: 7-10 days PT Treatment/Interventions: Ambulation/gait training;Balance/vestibular training;Community reintegration;Discharge planning;Disease management/prevention;DME/adaptive equipment instruction;Functional mobility training;Neuromuscular re-education;Pain management;Patient/family education;Psychosocial  support;Stair training;Therapeutic Activities;Therapeutic Exercise;UE/LE Strength taining/ROM;UE/LE Coordination activities;Wheelchair propulsion/positioning PT Transfers Anticipated Outcome(s): mod I PT Locomotion Anticipated Outcome(s): mod I household, supervision community ambulator PT Recommendation Follow Up Recommendations: Outpatient PT;24  hour supervision/assistance Patient destination: Home Equipment Recommended: To be determined  Skilled Therapeutic Intervention Session 1: Skilled therapeutic intervention initiated after completion of evaluation. Discussed with patient falls risk, safety within room, and focus of therapy during stay. Discussed possible LOS, goals, and f/u therapy. Performed 5TSS with BUE support = 26 sec. Patient left sitting in recliner with call bell and phone within reach, RN notified of patient position.   Five times Sit to Stand Test (FTSS) Method: Use a straight back chair with a solid seat that is 16-18" high. Ask participant to sit on the chair with arms folded across their chest.   Instructions: "Stand up and sit down as quickly as possible 5 times, keeping your arms folded across your chest."   Measurement: Stop timing when the participant stands the 5th time.  TIME: ___26___ (in seconds)  Times > 13.6 seconds is associated with increased disability and morbidity (Guralnik, 2000) Times > 15 seconds is predictive of recurrent falls in healthy individuals aged 45 and older (Buatois, et al., 2008) Normal performance values in community dwelling individuals aged 67 and older (Bohannon, 2006): o 60-69 years: 11.4 seconds o 70-79 years: 12.6 seconds o 80-89 years: 14.8 seconds  MCID: ? 2.3 seconds for Vestibular Disorders (Meretta, 2006)   Session 2: Focus on functional ambulation, coordination, attention to L side of body, NMR, and standing balance. Patient performed modified stand pivot transfer recliner > wheelchair with min A and cues for sequencing  and safety. Patient propelled wheelchair using BLE to gym with supervision. Gait training using RW x 90 ft + 130 ft with min A overall and verbal/demonstration cues for L heel strike due to L steppage gait pattern with toe strike and cues for upright posture and forward gaze. Performed 10 MWT using RW with min A = 0.25 m/s. Patient demonstrates increased fall risk as noted by score of 31/56 on Berg Balance Scale (<36= high risk for falls, close to 100%; 37-45 significant >80%; 46-51 moderate >50%; 52-55 lower >25%). Patient ambulated back to room and requested to return to bed. Transferred sit > supine with supervision and left semi reclined in bed with needs within reach.     PT Evaluation Precautions/Restrictions Precautions Precautions: Fall Restrictions Weight Bearing Restrictions: No General Chart Reviewed: Yes Family/Caregiver Present: No  Pain Pain Assessment Pain Assessment: No/denies pain Home Living/Prior Functioning Home Living Available Help at Discharge: Family;Available PRN/intermittently;Neighbor (Rosetta Foust, neighbor, and grandson, intermittently) Type of Home: House Home Access: Level entry Home Layout: One level Additional Comments: Works at a [employer] With: Other (Comment) Yolanda Bonine, Nicole Kindred) Prior Function Level of Independence: Independent with basic ADLs;Independent with gait;Independent with transfers  Able to Take Stairs?: Yes Driving: Yes Vocation: Part time employment Vocation Requirements: 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, retired from driving for [employer]: enjoys golfing, Interior and spatial designer with friends, eats at [employer] frequently Vision/Perception  Vision - Assessment Additional Comments: WFL Perception Comments: mild left inattention  Cognition Overall Cognitive Status: Within Functional Limits for tasks assessed Orientation Level: Oriented X4 Attention: Selective Selective Attention: Appears intact Memory: Appears  intact Awareness: Appears intact Problem Solving: Impaired Problem Solving Impairment: Functional basic;Verbal complex Executive Function: Self Correcting Self Correcting: Impaired Self Correcting Impairment: Functional basic Safety/Judgment: Appears intact Sensation Sensation Light Touch: Appears Intact Stereognosis: Appears Intact Hot/Cold: Appears Intact Proprioception: Appears Intact Proprioception Impaired Details: Impaired LLE;Impaired LUE Coordination Gross Motor Movements are Fluid and Coordinated: No Fine Motor Movements are Fluid and Coordinated: Not tested Coordination and Movement  Description: ataxic LLE & LUE Finger Nose Finger Test: Impaired, overshooting with left LUE Heel Shin Test: mildly impaired coordination, decreased accuracy/ROM with LLE Motor  Motor Motor: Hemiplegia;Ataxia Motor - Skilled Clinical Observations: Mild L hemiplegia and ataxic LLE & LUE  Mobility Bed Mobility Bed Mobility: Rolling Right;Rolling Left;Right Sidelying to Sit;Left Sidelying to Sit;Supine to Sit Rolling Right: 5: Supervision Rolling Left: 5: Supervision Right Sidelying to Sit: 5: Supervision Left Sidelying to Sit: 5: Supervision Supine to Sit: 5: Supervision Transfers Transfers: Yes Sit to Stand: 4: Min assist Stand to Sit: 4: Min assist Stand to Sit Details (indicate cue type and reason): Verbal cues for technique;Tactile cues for placement Stand to Sit Details: Requires cues and contact for safety d/t left LE ataxia with possible mild cognitive delay with processing instructions. Stand Pivot Transfers: 3: Mod assist Stand Pivot Transfer Details: Manual facilitation for weight shifting;Verbal cues for technique;Verbal cues for sequencing Locomotion  Ambulation Ambulation: Yes Ambulation/Gait Assistance: 3: Mod assist Ambulation Distance (Feet): 130 Feet Assistive device: 1 person hand held assist Ambulation/Gait Assistance Details: Verbal cues for technique;Verbal cues  for precautions/safety Gait Gait: Yes Gait Pattern: Impaired Gait Pattern: Step-through pattern;Left steppage;Decreased stride length;Narrow base of support;Poor foot clearance - left;Ataxic Gait velocity: 10 MWT using RW wtih min A = 0.25 m/s Stairs / Additional Locomotion Stairs: Yes Stairs Assistance: 4: Min assist Stairs Assistance Details: Verbal cues for sequencing;Verbal cues for technique Stair Management Technique: Two rails;Step to pattern;Forwards Number of Stairs: 12 Height of Stairs: 6 Ramp: Not tested (comment) Curb: Not tested (comment) Architect: Yes Wheelchair Assistance: 5: Supervision Wheelchair Assistance Details: Verbal cues for sequencing;Verbal cues for Information systems manager: Both lower extermities Wheelchair Parts Management: Supervision/cueing Distance: 130 ft  Trunk/Postural Assessment  Cervical Assessment Cervical Assessment: Within Functional Limits Thoracic Assessment Thoracic Assessment: Within Functional Limits Lumbar Assessment Lumbar Assessment: Within Functional Limits Postural Control Postural Control: Deficits on evaluation Protective Responses: delayed  Balance Balance Balance Assessed: Yes Standardized Balance Assessment Standardized Balance Assessment: Berg Balance Test Berg Balance Test Sit to Stand: Able to stand without using hands and stabilize independently Standing Unsupported: Able to stand 2 minutes with supervision Sitting with Back Unsupported but Feet Supported on Floor or Stool: Able to sit safely and securely 2 minutes Stand to Sit: Controls descent by using hands Transfers: Able to transfer with verbal cueing and /or supervision Standing Unsupported with Eyes Closed: Able to stand 10 seconds with supervision Standing Ubsupported with Feet Together: Able to place feet together independently and stand for 1 minute with supervision From Standing, Reach Forward with  Outstretched Arm: Reaches forward but needs supervision From Standing Position, Pick up Object from Floor: Able to pick up shoe, needs supervision From Standing Position, Turn to Look Behind Over each Shoulder: Looks behind one side only/other side shows less weight shift Turn 360 Degrees: Needs assistance while turning Standing Unsupported, Alternately Place Feet on Step/Stool: Needs assistance to keep from falling or unable to try (mod-max A) Standing Unsupported, One Foot in Front: Needs help to step but can hold 15 seconds Standing on One Leg: Tries to lift leg/unable to hold 3 seconds but remains standing independently Total Score: 31 Dynamic Sitting Balance Sitting balance - Comments: Leaning left, able to self correct with effort and cues Static Standing Balance Static Standing - Balance Support: During functional activity;No upper extremity supported Static Standing - Level of Assistance: 5: Stand by assistance Dynamic Standing Balance Dynamic Standing - Balance Support: During functional activity;Left upper  extremity supported Dynamic Standing - Level of Assistance: 4: Min assist;3: Mod assist Extremity Assessment  RUE Assessment RUE Assessment: Within Functional Limits LUE Assessment LUE Assessment: Within Functional Limits RLE Assessment RLE Assessment: Within Functional Limits (grossly 5/5 throughout) LLE Assessment LLE Assessment: Within Functional Limits (grossly 4+/5 throughout)  FIM:  FIM - Control and instrumentation engineer Devices: Arm rests Bed/Chair Transfer: 5: Supine > Sit: Supervision (verbal cues/safety issues);3: Chair or W/C > Bed: Mod A (lift or lower assist);3: Bed > Chair or W/C: Mod A (lift or lower assist) FIM - Locomotion: Wheelchair Distance: 130 ft Locomotion: Wheelchair: 2: Travels 50 - 149 ft with supervision, cueing or coaxing FIM - Locomotion: Ambulation Locomotion: Ambulation Assistive Devices: Other (comment) (L  HHA) Ambulation/Gait Assistance: 3: Mod assist Locomotion: Ambulation: 2: Travels 50 - 149 ft with moderate assistance (Pt: 50 - 74%) FIM - Locomotion: Stairs Locomotion: Scientist, physiological: Hand rail - 2 Locomotion: Stairs: 4: Up and Down 12 - 14 stairs with minimal assistance (Pt.>75%)   Refer to Care Plan for Long Term Goals  Recommendations for other services: None  Discharge Criteria: Patient will be discharged from PT if patient refuses treatment 3 consecutive times without medical reason, if treatment goals not met, if there is a change in medical status, if patient makes no progress towards goals or if patient is discharged from hospital.  The above assessment, treatment plan, treatment alternatives and goals were discussed and mutually agreed upon: by patient  Laretta Alstrom 08/24/2014, 12:45 PM

## 2014-08-24 NOTE — Care Management Note (Signed)
[facility] Individual Statement of Services  Patient Name:  Jeffery Campbell  Date:  08/24/2014  Welcome to the [facility].  Our goal is to provide you with an individualized program based on your diagnosis and situation, designed to meet your specific needs.  With this comprehensive rehabilitation program, you will be expected to participate in at least 3 hours of rehabilitation therapies Monday-Friday, with modified therapy programming on the weekends.  Your rehabilitation program will include the following services:  Physical Therapy (PT), Occupational Therapy (OT), 24 hour per day rehabilitation nursing, Therapeutic Recreaction (TR), Case Management (Social Worker), Rehabilitation Medicine, Nutrition Services and Pharmacy Services  Weekly team conferences will be held on Tuesdays to discuss your progress.  Your Social Worker will talk with you frequently to get your input and to update you on team discussions.  Team conferences with you and your family in attendance may also be held.  Expected length of stay: 7-10 days  Overall anticipated outcome: modified independent  Depending on your progress and recovery, your program may change. Your Social Worker will coordinate services and will keep you informed of any changes. Your Social Worker's name and contact numbers are listed  below.  The following services may also be recommended but are not provided by the [facility]:   Driving Evaluations  Home Health Rehabiltiation Services  Outpatient Rehabilitation Services  Vocational Rehabilitation   Arrangements will be made to provide these services after discharge if needed.  Arrangements include referral to agencies that provide these services.  Your insurance has been verified to be:  [employer] Your primary doctor is:  Dr. Thana AtesMorrisey  Pertinent information will be shared with your doctor and your insurance company.  Social  Worker:  [place] ParkLucy Sipriano [place], [place] [phone] or ([phone]) [phone]   Information discussed with and copy given to patient by: Amada JupiterHOYLE, [name], 08/24/2014, 1:17 PM

## 2014-08-24 NOTE — Progress Notes (Signed)
Occupational Therapy Note  Patient Details  Name: Jeffery Campbell MRN: [id number] Date of Birth: 03/10/1929  Today's Date: 08/24/2014 OT Individual Time: 1330-1400 OT Individual Time Calculation (min): 30 min   Pt denied pain Individual Therapy  Pt resting in recliner upon arrival.  Pt initially engaged in functional amb with RW for toilet transfers and home mgmt tasks.  Pt requires steady A with ambulation and close supervision for sit<>stand.  Pt noted with ataxic movments with LUE.  Pt issued theraputty with 10 beads.  Pt return demonstrated removing beads with left hand to increase functional use for BADLs.  Discussed goals for rehab stay, discharge planning, and DME needs.   Jeffery Campbell, Jeffery Campbell 08/24/2014, 2:12 PM

## 2014-08-24 NOTE — Progress Notes (Signed)
[place] PHYSICAL MEDICINE & REHABILITATION     PROGRESS NOTE    Subjective/Complaints: Had a good night. Slept well. Pt denies nausea, vomiting, abdominal pain, diarrhea, chest pain, shortness of breath, palpitations, dizziness   Objective: Vital Signs: Blood pressure 136/75, pulse 59, temperature 97.9 F (36.6 C), temperature source Oral, resp. rate 19, height  (1.854 m), weight 111.3 kg (245 lb 6 oz), SpO2 92 %. Dg Chest 2 View  08/23/2014   CLINICAL DATA:  Wheezing for 1 month. Recent stroke with left-sided weakness. History of hypertension. Initial encounter.  EXAM: CHEST  2 VIEW  COMPARISON:  08/19/2014.  FINDINGS: The heart size and mediastinal contours are stable. Mild pulmonary interstitial prominence appears unchanged. The overall pulmonary aeration is improved. There is no edema, confluent airspace opacity or significant pleural effusion. There are stable mild degenerative changes of the thoracolumbar spine.  IMPRESSION: No acute cardiopulmonary process.   Electronically Signed   By: Carey Bullocks M.D.   On: 08/23/2014 18:30    Recent Labs  08/23/14 0519 08/24/14 0620  WBC 7.8 7.9  HGB 16.8 17.3*  HCT 47.3 48.9  PLT 177 181    Recent Labs  08/23/14 0519  NA 140  K 3.9  CL 106  GLUCOSE 104*  BUN 18  CREATININE 1.27*  CALCIUM 8.8*   CBG (last 3)  No results for input(s): GLUCAP in the last 72 hours.  Wt Readings from Last 3 Encounters:  08/23/14 111.3 kg (245 lb 6 oz)  05/24/14 115.327 kg (254 lb 4 oz)  04/05/14 115.781 kg (255 lb 4 oz)    Physical Exam:  Constitutional: He is oriented to person, place, and time. He appears well-developed and well-nourished.  HENT: oral mucosa appears dry Head: Normocephalic and atraumatic.  Eyes: Conjunctivae are normal. Pupils are equal, round, and reactive to light.  Neck: Normal range of motion. Neck supple.  Cardiovascular: Normal rate and regular rhythm. no murmur Respiratory: Effort normal and  breath sounds normal. No respiratory distress. Minimal wheezing.  GI: Soft. Bowel sounds are normal. He exhibits no distension. There is no tenderness.  Musculoskeletal: He exhibits no edema or tenderness.  Neurological: He is alert and oriented to person, place, and time. Normal insight and awareness. He has normal reflexes.  Left central 7 and tongue deviation. Speech slightly slurred but intelligible.   LUE and LLE proprioceptive deficits. . Left upper and left lower limb ataxia with fine movements.. Motor: 4+/5 left deltoid, bicep, tricep, hi, hip, knee, ankle.  Skin: Skin is warm and dry.  Psychiatric: He has a normal mood and affect. His behavior is normal. Judgment and thought content normal.   Assessment/Plan: 1. Functional deficits secondary to right thalamic/PLIC infarct which require 3+ hours per day of interdisciplinary therapy in a comprehensive inpatient rehab setting. Physiatrist is providing close team supervision and 24 hour management of active medical problems listed below. Physiatrist and rehab team continue to assess barriers to discharge/monitor patient progress toward functional and medical goals. FIM:                   Comprehension Comprehension Mode: Auditory Comprehension: 5-Follows basic conversation/direction: With no assist  Expression Expression Mode: Verbal Expression: 5-Expresses basic needs/ideas: With extra time/assistive device  Social Interaction Social Interaction: 6-Interacts appropriately with others with medication or extra time (anti-anxiety, antidepressant).        Medical Problem List and Plan: 1. Functional deficits secondary to right thalamic/PLIC infarct 2. DVT Prophylaxis/Anticoagulation: Pharmaceutical: Lovenox 3. Pain  Management: Off meloxicam. Tylenol prn for OA pain. Continue Flexeril at bedtime.  4. Mood: Continue Celexa daily with valium prn. Motivated and no signs of distress noted. LCSW to follow for  evaluation and support.  5. Neuropsych: This patient is capable of making decisions on his own behalf. 6. Skin/Wound Care: Routine pressure relief measures.  7. Fluids/Electrolytes/Nutrition: labs pending.  Offer supplements prn poor intake. 8. HTN: Monitor every 8 hours. Continue Cardizem and Hytrin. Avoid hypotension.  9. GERD: symptoms stable on protonix.  10. Wheezing: Denies respiratory symptoms.  cxr negative---minimal wheezing on exam today LOS (Days) 1 A FACE TO FACE EVALUATION WAS PERFORMED  SWARTZ,ZACHARY T 08/24/2014 7:44 AM

## 2014-08-24 NOTE — Progress Notes (Signed)
Social Work  Social Work Assessment and Plan  Patient Details  Name: Jeffery Campbell MRN: [id number] Date of Birth: 21-May-1928  Today's Date: 08/24/2014  Problem List:  Patient Active Problem List   Diagnosis Date Noted  . Thalamic infarct, acute   . Left hemiparesis   . CVA (cerebral infarction) 08/19/2014  . Stroke 08/19/2014  . Hypertension 08/19/2014  . HLD (hyperlipidemia) 08/19/2014  . CKD (chronic kidney disease) stage 3, GFR 30-59 ml/min 08/19/2014  . Accelerated hypertension   . 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 Obesity   . Asymptomatic PVCs 05/27/2014  . Bradycardia 04/05/2014  . Essential hypertension 04/05/2014   Past Medical History:  Past Medical History  Diagnosis Date  . Diverticulitis   . Hypertension   . Pure hypercholesterolemia   . Hemorrhoids   . OA (osteoarthritis)   . Disturbance of sleep    Past Surgical History:  Past Surgical History  Procedure Laterality Date  . Transurethral resection of prostate    . Knee arthroscopy    . Colonoscopy     Social History:  reports that he has never smoked. He does not have any smokeless tobacco history on file. He reports that he does not drink alcohol or use illicit drugs.  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 Grandson works for his dad (pt's son named [name]) and both son and grandson will be checking on pt as needed. Son does work also.  Caregiver Availability: Intermittent Family Dynamics: pt describes sons and grandson as very supportive.  Denies any concerns about help available upon d/c.  Social  History Preferred language: English Religion: Unknown Cultural Background: NA Education: HS Read: Yes Write: Yes Employment Status: Retired Date Retired/Disabled/Unemployed: 2002 Fish farm manager Issues: None Guardian/Conservator: None - per MD, pt capable of making decisions on his own behalf   Abuse/Neglect Physical Abuse: Denies Verbal Abuse: Denies Sexual Abuse: Denies Exploitation of patient/patient's resources: Denies Self-Neglect: Denies  Emotional Status Pt's affect, behavior adn adjustment status: Pt very pleasant, talkative and completes assessment interview without difficulty.  He is optimistic about his recovery overall and looking forward to returning to [employer] and job.  Denies any s/s of depression or anxiety.  No formal depression screen indicated. Recent Psychosocial Issues: None Pyschiatric History: None Substance Abuse History: None  Patient / Family Perceptions, Expectations & Goals Pt/Family understanding of illness & functional limitations: pt and family with good, general understanding of his CVA and resulting functional limitations Premorbid pt/family roles/activities: Pt very active and independent.  Was working approx 15 to 20 hours each week at [employer] course as well as playing frequently.   Anticipated changes in roles/activities/participation: would not anticipate any significant role changes as pt has mod i goals.  He may be limited with driving initially and son/ grandson can assist with this. Pt/family expectations/goals: "I just want to be able to play golf again."  [employer]: None Premorbid Home Care/DME Agencies: None Transportation available at discharge: yes  Discharge Planning Living Arrangements: Other relatives (grandson) Support Systems: Children, Other relatives, Friends/neighbors Type of Residence: Private residence Insurance Resources: Medicare ((Humana Gold Plus/ Silverback) Surveyor, quantity  Resources:  Social Security, Employment Financial Screen Referred: No Living Expenses: Own Campbell Management: Patient Does the patient have any problems obtaining your medications?: No Home Management: pt and grandson share responsibilities Patient/Family Preliminary Plans: Pt plans to d/c home with grandson and son providing intermittent support.  Can ask neighbor to assist if needed. Social Work Anticipated Follow Up Needs: HH/OP, Support Group Expected length of stay: 7 days  Clinical Impression Very pleasant gentleman here following a CVA and making excellent gains.  Denies any s/s of emotional distress.  Good family support and anticipating a short LOS.  Will follow for support and d/c planning needs  Cherylin Waguespack 08/24/2014, 1:16 PM

## 2014-08-24 NOTE — Progress Notes (Signed)
Patient information reviewed and entered into eRehab system by Minyon Billiter, RN, CRRN, PPS Coordinator.  Information including medical coding and functional independence measure will be reviewed and updated through discharge.     Per nursing patient was given "Data Collection Information Summary for Patients in Inpatient Rehabilitation Facilities with attached "Privacy Act Statement-Health Care Records" upon admission.  

## 2014-08-24 NOTE — Evaluation (Signed)
Occupational Therapy Assessment and Plan  Patient Details  Name: Jeffery Campbell MRN: [id number] Date of Birth: 1929-03-28  OT Diagnosis: ataxia and hemiplegia affecting non-dominant side Rehab Potential: Rehab Potential : Excellent ELOS: 7-10 days   Today's Date: 08/24/2014 OT Individual Time: 4010-2725 OT Individual Time Calculation (min): 60 min     Problem List:  Patient Active Problem List   Diagnosis Date Noted  . Thalamic infarct, acute   . Left hemiparesis   . CVA (cerebral infarction) 08/19/2014  . Stroke 08/19/2014  . Hypertension 08/19/2014  . HLD (hyperlipidemia) 08/19/2014  . CKD (chronic kidney disease) stage 3, GFR 30-59 ml/min 08/19/2014  . Accelerated hypertension   . 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 Obesity   . Asymptomatic PVCs 05/27/2014  . Bradycardia 04/05/2014  . Essential hypertension 04/05/2014    Past Medical History:  Past Medical History  Diagnosis Date  . Diverticulitis   . Hypertension   . Pure hypercholesterolemia   . Hemorrhoids   . OA (osteoarthritis)   . Disturbance of sleep    Past Surgical History:  Past Surgical History  Procedure Laterality Date  . Transurethral resection of prostate    . Knee arthroscopy    . Colonoscopy      Assessment & Plan Clinical Impression: Patient is a 81 y.o. male with history of HTN, OA, bradycardia, anxiety d/o; who was admitted on 08/19/14 with left sided weakness and fall with inability to walk. CT head without acute changes. MRI/MRA brain done revealing acute ischemia right lateral thalamus v/s posterior limb of internal capsule. Carotid dopplers without significant ICA stenosis. 2D echo with EF 55-62%, no wall abnormality and grade I diastolic dysfunction. Dr. Erlinda Hong evaluated patient and recommends changing chronic ASA to plavix for stroke likely due to SVD. EKG with frequent PVCs but negative for A fib and outpatient cardiac monitoring arranged with cardiology. Carotid  dopplers without significant ICA stenosis. Patient with resultant left sided weakness and mild flaccid dysarthria..  Patient transferred to CIR on 08/23/2014 .    Patient currently requires min assist with basic self-care skills secondary to ataxia and decreased coordination.  Prior to hospitalization, patient could complete BADL & iADL independently.   Patient will benefit from skilled intervention to increase independence with basic self-care skills prior to discharge home independently.  Anticipate patient will require intermittent supervision and follow up outpatient.  OT - End of Session Activity Tolerance: Tolerates 30+ min activity with multiple rests Endurance Deficit: Yes OT Assessment Rehab Potential (ACUTE ONLY): Excellent Barriers to Discharge: Decreased caregiver support OT Patient demonstrates impairments in the following area(s): Balance;Endurance;Perception;Safety OT Basic ADL's Functional Problem(s): Bathing;Dressing;Toileting OT Transfers Functional Problem(s): Toilet;Tub/Shower OT Additional Impairment(s): Fuctional Use of Upper Extremity OT Plan OT Intensity: Minimum of 1-2 x/day, 45 to 90 minutes OT Frequency: 5 out of 7 days OT Duration/Estimated Length of Stay: 7-10 days OT Treatment/Interventions: Balance/vestibular training;DME/adaptive equipment instruction;Patient/family education;Self Care/advanced ADL retraining;Therapeutic Exercise;Therapeutic Activities;UE/LE Coordination activities;Functional mobility training OT Self Feeding Anticipated Outcome(s): Mod I OT Basic Self-Care Anticipated Outcome(s): Mod I OT Toileting Anticipated Outcome(s): Mod I OT Bathroom Transfers Anticipated Outcome(s): Mod I OT Recommendation Patient destination: Home Follow Up Recommendations: None Equipment Recommended: Tub/shower bench  Skilled Therapeutic Intervention OT 1:1 evaluation completed with treatment provided to emphasize orientation to goals and methods of treatment,  transfer training, functional mobility using RW, attention to left, safety awareness, and improved gross/FMC of left U/LE.   Pt able to perform transfers with min  assist and mod instructional cues.  Pt doffed/donned shirt, socks and shoes however declined to tie his shoes d/t preference for laces untied while resting.       OT Evaluation Precautions/Restrictions  Precautions Precautions: Fall Restrictions Weight Bearing Restrictions: No  General Chart Reviewed: Yes Family/Caregiver Present: No  Vital Signs  Pain Pain Assessment Pain Assessment: No/denies pain  Home Living/Prior Functioning Home Living Family/patient expects to be discharged to:: Private residence Living Arrangements: Other relatives Biomedical scientist) Available Help at Discharge: Family, Available PRN/intermittently, Neighbor (Rosetta Foust, neighbor, and grandson, intermittently) Type of Home: House Home Access: Level entry Home Layout: One level Additional Comments: Works at a [employer] With: Other (Comment) Yolanda Bonine, Nicole Kindred) IADL History Homemaking Responsibilities: Yes Meal Prep Responsibility: Secondary (Eats out mostly, simple meals at home) Laundry Responsibility: Secondary Cleaning Responsibility: Secondary Bill Paying/Finance Responsibility: Primary Shopping Responsibility: Primary Child Care Responsibility: No Homemaking Comments: Grandson does most homemaking, pt able if needed. 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, retired from driving for [place]: enjoys golfing, Interior and spatial designer with friends, eats at [employer] frequently  ADL ADL ADL Comments: se  FIM   Vision/Perception  Vision- History Baseline Vision/History: Wears glasses Patient Visual Report: No change from baseline Vision- Assessment Vision Assessment?: Yes Additional Comments: WFL Perception Comments: mild left inattention   Cognition Overall Cognitive Status: Within Functional Limits for tasks assessed Orientation Level: Oriented X4 Attention: Selective Selective Attention: Appears intact Memory: Appears intact Awareness: Appears intact Problem Solving: Impaired Problem Solving Impairment: Functional basic;Verbal complex Executive Function: Self Correcting Self Correcting: Impaired Self Correcting Impairment: Functional basic Safety/Judgment: Appears intact   Sensation Sensation Light Touch: Appears Intact Stereognosis: Appears Intact Hot/Cold: Appears Intact Proprioception: Appears Intact Proprioception Impaired Details: Impaired LLE;Impaired LUE Coordination Gross Motor Movements are Fluid and Coordinated: No Fine Motor Movements are Fluid and Coordinated: Not tested Coordination and Movement Description: ataxic LLE & LUE Finger Nose Finger Test: Impaired, overshooting with left LUE Heel Shin Test: mildly impaired coordination, decreased accuracy/ROM with LLE   Motor  Motor Motor: Hemiplegia;Ataxia Motor - Skilled Clinical Observations: Mild L hemiplegia and ataxic LLE & LUE   Mobility  Bed Mobility Bed Mobility: Rolling Right;Rolling Left;Right Sidelying to Sit;Left Sidelying to Sit;Supine to Sit Rolling Right: 5: Supervision Rolling Left: 5: Supervision Right Sidelying to Sit: 5: Supervision Left Sidelying to Sit: 5: Supervision Supine to Sit: 5: Supervision Transfers Transfers: Sit to Stand;Stand to Sit Sit to Stand: 4: Min assist Stand to Sit: 4: Min assist Stand  to Sit Details (indicate cue type and reason): Verbal cues for technique;Tactile cues for placement Stand to Sit Details: Requires cues and contact for safety d/t left LE ataxia with possible mild cognitive delay with processing instructions.   Trunk/Postural Assessment  Cervical Assessment Cervical Assessment: Within Functional Limits Thoracic Assessment Thoracic Assessment: Within Functional Limits Lumbar Assessment Lumbar Assessment: Within Functional Limits Postural Control Postural Control: Deficits on evaluation Protective Responses: delayed   Balance Dynamic Sitting Balance Sitting balance - Comments: Leaning left, able to self correct with effort and cues   Extremity/Trunk Assessment  RUE Assessment RUE Assessment: Within Functional Limits LUE Assessment LUE Assessment: Within Functional Limits  FIM:  FIM - Bathing Bathing: 0: Activity did not occur FIM - Upper Body Dressing/Undressing Upper body dressing/undressing steps patient completed: Thread/unthread right sleeve of pullover shirt/dresss;Thread/unthread left sleeve of pullover shirt/dress;Put head through opening of pull over shirt/dress;Pull shirt over trunk Upper body dressing/undressing: 5: Supervision: Safety issues/verbal cues FIM - Lower Body Dressing/Undressing Lower body dressing/undressing steps patient completed: Don/Doff right sock;Don/Doff left sock;Don/Doff right shoe;Don/Doff left shoe Lower body dressing/undressing: 3: Mod-Patient completed 50-74% of tasks FIM - Control and instrumentation engineer Devices: Copy: 5: Supine > Sit: Supervision (verbal cues/safety issues);5: Sit > Supine: Supervision (verbal cues/safety issues);4: Bed > Chair or W/C: Min A (steadying Pt. > 75%);4: Chair or W/C > Bed: Min A (steadying Pt. > 75%) FIM - Radio producer Devices: Bedside commode;Walker;Grab bars Toilet Transfers: 4-To toilet/BSC: Min A (steadying Pt.  > 75%);4-From toilet/BSC: Min A (steadying Pt. > 75%) FIM - Tub/Shower Transfers Tub/Shower Assistive Devices: Tub transfer bench;Walker;Walk in shower;Grab bars Tub/shower Transfers: 4-Into Tub/Shower: Min A (steadying Pt. > 75%/lift 1 leg);4-Out of Tub/Shower: Min A (steadying Pt. > 75%/lift 1 leg)   Refer to Care Plan for Long Term Goals  Recommendations for other services: None  Discharge Criteria: Patient will be discharged from OT if patient refuses treatment 3 consecutive times without medical reason, if treatment goals not met, if there is a change in medical status, if patient makes no progress towards goals or if patient is discharged from hospital.  The above assessment, treatment plan, treatment alternatives and goals were discussed and mutually agreed upon: by patient  [facility] 08/24/2014, 12:44 PM

## 2014-08-25 ENCOUNTER — Inpatient Hospital Stay (HOSPITAL_COMMUNITY): Payer: Commercial Managed Care - HMO

## 2014-08-25 ENCOUNTER — Inpatient Hospital Stay (HOSPITAL_COMMUNITY): Payer: Commercial Managed Care - HMO | Admitting: Physical Therapy

## 2014-08-25 ENCOUNTER — Inpatient Hospital Stay (HOSPITAL_COMMUNITY): Payer: Commercial Managed Care - HMO | Admitting: *Deleted

## 2014-08-25 MED ORDER — DOCUSATE SODIUM 100 MG PO CAPS
100.0000 mg | ORAL_CAPSULE | Freq: Two times a day (BID) | ORAL | Status: DC
  Administered 2014-08-25 – 2014-09-01 (×15): 100 mg via ORAL
  Filled 2014-08-25 (×19): qty 1

## 2014-08-25 NOTE — Progress Notes (Signed)
Occupational Therapy Session Note  Patient Details  Name: Jeffery BrittleJoe Rane MRN: [id number] Date of Birth: 05-Aug-1928  Today's Date: 08/25/2014  Session 1 OT Individual Time: 0700-0800 OT Individual Time Calculation (min): 60 min   Session 2 OT Individual Time: 1100-1200 Time Calculation: 60 min   Short Term Goals: Week 1:  OT Short Term Goal 1 (Week 1): STG=LTG d/t short LOS  Skilled Therapeutic Interventions/Progress Updates:    Session 1  Pt engaged in BADL retraining including bathing at shower level and dressing with sit<>stand from EOB.  Pt amb with RW to shower and performed bathing tasks with sit<>stand using grab bars.  Pt stated his son was installing grab bars at home.  Pt required steady A when standing to bathe buttocks and pull up pants.  Pt required min verbal cues for safety awareness.  Pt LUE exhibits ataxic movements with decreased coordination.  Pt incorporates LUE into all functional tasks.  Focus on activity tolerance, sit<>stand, standing balance, functional transfers, increased LUE use, and safety awareness.  Session 2 Pt amb with RW to therapy gym and initially engaged in dynamic standing tasks while using LUE/hand to grasp clothes pins and placing them on metal dowel.  Pt required to exhibit increased shoulder flexion >90 degrees.  Pt noted with increased LUE fatigue with continued activity.  Pt also engaged in using BUE to hold large therapy ball and toss to therapist while seated.  Pt required multiple rest breaks throughout session.  Pt requires more than reasonable amount of time to complete tasks with LUE.  Pt required min verbal cues to pick up LLE when walking, especially when walking on carpet.  Focus on functional amb with RW, sit<>stand, standing balance, LUE use, activity tolerance, and safety awareness.  Therapy Documentation Precautions:  Precautions Precautions: Fall Restrictions Weight Bearing Restrictions: No Pain: Pain Assessment Pain Assessment:  No/denies pain ADL: ADL ADL Comments: se  FIM  See FIM for current functional status  Therapy/Group: Individual Therapy  [name], Macrae Wiegman Chappell 08/25/2014, 8:01 AM

## 2014-08-25 NOTE — Progress Notes (Signed)
Physical Therapy Session Note  Patient Details  Name: Jeffery Campbell MRN: [id number] Date of Birth: 14-Oct-1928  Today's Date: 08/25/2014 PT Individual Time:  -  [phone] Treatment Session 2: 1530-1600   Treatment Time: 60 min Treatment Session 2: 30 min  Short Term Goals: Week 1:  PT Short Term Goal 1 (Week 1): = LTGs due to anticipated LOS   Skilled Therapeutic Interventions/Progress Updates:    Treatment Session 1: Gait Training: PT instructs pt in ambulation with RW x 150' req CGA-occasional min A for balance when L foot stubs ground, focus is on overcorrecting L hip/knee flexion to improve L foot clearance during swing phase.  PT instructs pt in ascending/descending one flight (12 steps) with B rails req verbal cues for L UE attention and foot placement. Pt completes stairs with CGA-min A in primarily step-to pattern, with occasional step-over-step pattern on lower height steps.   Therapeutic Activity: PT instructs pt in stand-step transfer with RW req CGA for safety and verbal cues for hand placement. PT instructs pt in all aspects of bed mobility req SBA for verbal cues in flat bed without rails.  PT instructs pt in pre-falls recovery mat mobility: rolling supine to prone, pushing up to quadruped, crawling A/P/R/L on mat in quadruped - all req CGA and verbal cues for technique. PT progresses this to include crawling to blue bench on mat, completing modified short (knees do not bend a lot past 90 degrees) to long sit transfer for bilateral glute strengthening in functional position x 10 reps, progressing to tall kneel with hands propped to half kneel with R leg in front - req up to min A for balance and verbal cues to increase weight shift to L (over supportive leg) in order to unweight R leg to complete action.   Neuromuscular Reeducation: PT instructs pt in high level dynamic balance activities with HHA from PT including: side step R/L x 40' each direction req min-mod A due to pt heavy  reliance on UE assist, req verbal cues for head/chest up, tuck your bottom, and picking up feet higher, while PT faciliates lateral weight shift.  PT instructs pt in retropulsion with RW - focus on fully weight shifting over each leg in order to off weight contralateral leg to improve foot clearance and take a step: x 40' req up to min A and verbal cues for upright posture.   Therapeutic Exercise: PT instructs pt on nu-step at L5, decreasing to L3 due to fatigue x 10 minutes overall with B UEs/LEs for improved cardiorespiratory endurance with verbal cues to use all 4 extremities. PT instructs pt to take rest breaks as needed if he gets fatigued.   Treatment Session 2: Therapeutic Activity: Pt received in bed and completes supine to sit transfer from flat bed without rail req SBA. PT hands pt shoes and he dons them with increased time and set up assist.  PT instructs pt in car transfer with RW req CGA-SBA to get in, but then CGA to get out due to pt with decreased forward trunk lean on sit to stand out of car and would have hit head without PT's hand on back of head/neck to protect it.   Neuromuscular Reeducation: PT instructs pt in TUG x 3 attempts with RW and pt scores: 43 seconds, 42 seconds, and 24 seconds.   Pt is progressing well - overall limited by impaired motor control in L UE/LE, impaired standing balance, and generalized low activity tolerance. Pt will benefit from continued  work on improving coordination with mobility, endurance training, and overall safety with ambulation on level surfaces & stairs. Continue per PT POC.    Therapy Documentation Precautions:  Precautions Precautions: Fall Restrictions Weight Bearing Restrictions: No Pain: Pain Assessment Pain Assessment: No/denies pain Treatment Session 2: Pt denies pain.   Balance: Balance Balance Assessed: Yes Standardized Balance Assessment Standardized Balance Assessment: Timed Up and Go Test Timed Up and Go  Test TUG: Normal TUG Normal TUG (seconds): 24  See FIM for current functional status  Therapy/Group: Individual Therapy  Donovyn Guidice M 08/25/2014, 8:33 AM

## 2014-08-25 NOTE — IPOC Note (Signed)
Overall Plan of Care [facility](IPOC) Patient Details Name: Jeffery Campbell MRN: [id number] DOB: 12-20-1928  Admitting Diagnosis: r thalmic CVA  Hospital Problems: Active Problems:   CVA (cerebral infarction)   Thalamic infarct, acute   Left hemiparesis     Functional Problem List: Nursing Medication Management, Endurance, Motor  PT Balance, Endurance, Motor, Perception, Safety  OT Balance, Endurance, Perception, Safety  SLP    TR Endurance, Motor, Safety       Basic ADL's: OT Bathing, Dressing, Toileting     Advanced  ADL's: OT       Transfers: PT Bed Mobility, Bed to Chair, Car, State Street CorporationFurniture, Floor  OT Toilet, Research scientist (life sciences)Tub/Shower     Locomotion: PT Ambulation, Psychologist, prison and probation servicesWheelchair Mobility, Stairs     Additional Impairments: OT Fuctional Use of Upper Extremity  SLP        TR      Anticipated Outcomes Item Anticipated Outcome  Self Feeding Mod I  Swallowing      Basic self-care  Mod I  Toileting  Mod I   Bathroom Transfers Mod I  Bowel/Bladder  Mod I  Transfers  mod I  Locomotion  mod I household, supervision community ambulator  Communication     Cognition     Pain  <3  Safety/Judgment  Mod I   Therapy Plan: PT Intensity: Minimum of 1-2 x/day ,45 to 90 minutes PT Frequency: 5 out of 7 days PT Duration Estimated Length of Stay: 7-10 days OT Intensity: Minimum of 1-2 x/day, 45 to 90 minutes OT Frequency: 5 out of 7 days OT Duration/Estimated Length of Stay: 7-10 days         Team Interventions: Nursing Interventions Medication Management, Disease Management/Prevention  PT interventions Ambulation/gait training, Warden/rangerBalance/vestibular training, Community reintegration, Discharge planning, Disease management/prevention, DME/adaptive equipment instruction, Functional mobility training, Neuromuscular re-education, Pain management, Patient/family education, Psychosocial support, Stair training, Therapeutic Activities, Therapeutic Exercise, UE/LE Strength taining/ROM, UE/LE  Coordination activities, Wheelchair propulsion/positioning  OT Interventions Warden/rangerBalance/vestibular training, Fish farm managerDME/adaptive equipment instruction, Patient/family education, Self Care/advanced ADL retraining, Therapeutic Exercise, Therapeutic Activities, UE/LE Coordination activities, Functional mobility training  SLP Interventions    TR Interventions Adaptive equipment instruction, 1:1 session, Warden/rangerBalance/vestibular training, Functional mobility training, FirefighterCommunity reintegration, Equities traderatient/family education, Therapeutic activities, Recreation/leisure participation, Therapeutic exercise  SW/CM Interventions Discharge Planning, Facilities managersychosocial Support, Patient/Family Education    Team Discharge Planning: Destination: PT-Home ,OT- Home , SLP-  Projected Follow-up: PT-Outpatient PT, 24 hour supervision/assistance, OT-  None, SLP-  Projected Equipment Needs: PT-To be determined, OT- Tub/shower bench, SLP-  Equipment Details: PT- , OT-  Patient/family involved in discharge planning: PT- Patient,  OT-Patient, SLP-   MD ELOS: right thalamic infarct Medical Rehab Prognosis:  Excellent Assessment: The patient has been admitted for CIR therapies with the diagnosis of right thalamic infarct. The team will be addressing functional mobility, strength, stamina, balance, safety, adaptive techniques and equipment, self-care, bowel and bladder mgt, patient and caregiver education, NMR, balance, visual-spatial awareness, communication. Goals have been set at mod I for mobility, self-care, communication.    Ranelle OysterZachary T. Swartz, MD, FAAPMR      See Team Conference Notes for weekly updates to the plan of care

## 2014-08-25 NOTE — Progress Notes (Signed)
[place] PHYSICAL MEDICINE & REHABILITATION     PROGRESS NOTE    Subjective/Complaints: No complaints. Busy day with therapies yesterday. No complaints.  Pt denies nausea, vomiting, abdominal pain, diarrhea, chest pain, shortness of breath, palpitations, dizziness   Objective: Vital Signs: Blood pressure 156/72, pulse 63, temperature 97.8 F (36.6 C), temperature source Oral, resp. rate 18, height 6\' 1"  (1.854 m), weight 111.3 kg (245 lb 6 oz), SpO2 93 %. Dg Chest 2 View  08/23/2014   CLINICAL DATA:  Wheezing for 1 month. Recent stroke with left-sided weakness. History of hypertension. Initial encounter.  EXAM: CHEST  2 VIEW  COMPARISON:  08/19/2014.  FINDINGS: The heart size and mediastinal contours are stable. Mild pulmonary interstitial prominence appears unchanged. The overall pulmonary aeration is improved. There is no edema, confluent airspace opacity or significant pleural effusion. There are stable mild degenerative changes of the thoracolumbar spine.  IMPRESSION: No acute cardiopulmonary process.   Electronically Signed   By: Carey BullocksWilliam  Veazey M.D.   On: 08/23/2014 18:30    Recent Labs  08/23/14 0519 08/24/14 0620  WBC 7.8 7.9  HGB 16.8 17.3*  HCT 47.3 48.9  PLT 177 181    Recent Labs  08/23/14 0519 08/24/14 0620  NA 140 138  K 3.9 3.9  CL 106 105  GLUCOSE 104* 103*  BUN 18 19  CREATININE 1.27* 1.27*  CALCIUM 8.8* 9.1   CBG (last 3)  No results for input(s): GLUCAP in the last 72 hours.  Wt Readings from Last 3 Encounters:  08/23/14 111.3 kg (245 lb 6 oz)  05/24/14 115.327 kg (254 lb 4 oz)  04/05/14 115.781 kg (255 lb 4 oz)    Physical Exam:  Constitutional: He is oriented to person, place, and time. He appears well-developed and well-nourished.  HENT: oral mucosa appears dry Head: Normocephalic and atraumatic.  Eyes: Conjunctivae are normal. Pupils are equal, round, and reactive to light.  Neck: Normal range of motion. Neck supple.   Cardiovascular: Normal rate and regular rhythm. no murmur Respiratory: Effort normal and breath sounds normal. No respiratory distress. Minimal wheezing.  GI: Soft. Bowel sounds are normal. He exhibits no distension. There is no tenderness.  Musculoskeletal: He exhibits no edema or tenderness.  Neurological: He is alert and oriented to person, place, and time. Normal insight and awareness. He has normal reflexes.  Left central 7 and tongue deviation. Speech slightly slurred but intelligible.   LUE and LLE proprioceptive deficits. . Left upper and left lower limb ataxia improving. Motor: 4+/5 left deltoid, bicep, tricep, hi, hip, knee, ankle.  Skin: Skin is warm and dry.  Psychiatric: He has a normal mood and affect. His behavior is normal. Judgment and thought content normal.   Assessment/Plan: 1. Functional deficits secondary to right thalamic/PLIC infarct which require 3+ hours per day of interdisciplinary therapy in a comprehensive inpatient rehab setting. Physiatrist is providing close team supervision and 24 hour management of active medical problems listed below. Physiatrist and rehab team continue to assess barriers to discharge/monitor patient progress toward functional and medical goals. FIM: FIM - Bathing Bathing: 0: Activity did not occur  FIM - Upper Body Dressing/Undressing Upper body dressing/undressing steps patient completed: Thread/unthread right sleeve of pullover shirt/dresss, Thread/unthread left sleeve of pullover shirt/dress, Put head through opening of pull over shirt/dress, Pull shirt over trunk Upper body dressing/undressing: 5: Supervision: Safety issues/verbal cues FIM - Lower Body Dressing/Undressing Lower body dressing/undressing steps patient completed: Don/Doff right sock, Don/Doff left sock, Don/Doff right shoe, Don/Doff  left shoe Lower body dressing/undressing: 3: Mod-Patient completed 50-74% of tasks     FIM - Scientist, research (physical sciences) Devices: Bedside commode, Environmental consultant, Grab bars Toilet Transfers: 4-To toilet/BSC: Min A (steadying Pt. > 75%), 4-From toilet/BSC: Min A (steadying Pt. > 75%)  FIM - Bed/Chair Transfer Bed/Chair Transfer Assistive Devices: Arm rests Bed/Chair Transfer: 5: Supine > Sit: Supervision (verbal cues/safety issues), 3: Chair or W/C > Bed: Mod A (lift or lower assist), 3: Bed > Chair or W/C: Mod A (lift or lower assist)  FIM - Locomotion: Wheelchair Distance: 130 ft Locomotion: Wheelchair: 2: Travels 50 - 149 ft with supervision, cueing or coaxing FIM - Locomotion: Ambulation Locomotion: Ambulation Assistive Devices: Other (comment) (L HHA) Ambulation/Gait Assistance: 3: Mod assist Locomotion: Ambulation: 2: Travels 50 - 149 ft with moderate assistance (Pt: 50 - 74%)  Comprehension Comprehension Mode: Auditory Comprehension: 6-Follows complex conversation/direction: With extra time/assistive device  Expression Expression Mode: Verbal Expression: 6-Expresses complex ideas: With extra time/assistive device  Social Interaction Social Interaction: 6-Interacts appropriately with others with medication or extra time (anti-anxiety, antidepressant).  Problem Solving Problem Solving: 5-Solves basic problems: With no assist  Memory Memory: 6-More than reasonable amt of time  Medical Problem List and Plan: 1. Functional deficits secondary to right thalamic/PLIC infarct 2. DVT Prophylaxis/Anticoagulation: Pharmaceutical: Lovenox 3. Pain Management: Off meloxicam. Tylenol prn for OA pain. Continue Flexeril at bedtime.  4. Mood: Continue Celexa daily with valium prn. Motivated and no signs of distress noted. LCSW to follow for evaluation and support.  5. Neuropsych: This patient is capable of making decisions on his own behalf. 6. Skin/Wound Care: Routine pressure relief measures.  7. Fluids/Electrolytes/Nutrition: encourage adequate PO intake  -keep an eye on Cr. 8. HTN: Monitor  every 8 hours. Continue Cardizem and Hytrin. Avoid hypotension.  9. GERD: symptoms stable on protonix.  10. Wheezing: Denies respiratory symptoms.  cxr negative---minimal wheezing on exam today LOS (Days) 2 A FACE TO FACE EVALUATION WAS PERFORMED  Evania Lyne T 08/25/2014 7:22 AM

## 2014-08-25 NOTE — Progress Notes (Signed)
Recreational Therapy Assessment and Plan  Patient Details  Name: Jeffery Campbell MRN: [id number] Date of Birth: 1928/05/06 Today's Date: 08/25/2014  Rehab Potential: Good ELOS: 10 days   Assessment Clinical Impression:  Problem List:  Patient Active Problem List   Diagnosis Date Noted  . Thalamic infarct, acute   . Left hemiparesis   . CVA (cerebral infarction) 08/19/2014  . Stroke 08/19/2014  . Hypertension 08/19/2014  . HLD (hyperlipidemia) 08/19/2014  . CKD (chronic kidney disease) stage 3, GFR 30-59 ml/min 08/19/2014  . Accelerated hypertension   . 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 Obesity   . Asymptomatic PVCs 05/27/2014  . Bradycardia 04/05/2014  . Essential hypertension 04/05/2014    Past Medical History:  Past Medical History  Diagnosis Date  . Diverticulitis   . Hypertension   . Pure hypercholesterolemia   . Hemorrhoids   . OA (osteoarthritis)   . Disturbance of sleep    Past Surgical History:  Past Surgical History  Procedure Laterality Date  . Transurethral resection of prostate    . Knee arthroscopy    . Colonoscopy      Assessment & Plan Clinical Impression: Jeffery Campbell is a 81 y.o. male with history of HTN, OA, bradycardia, anxiety d/o; who was admitted on 08/19/14 with left sided weakness and fall with inability to walk. CT head without acute changes. MRI/MRA brain done revealing acute ischemia right lateral thalamus v/s posterior limb of internal capsule. Carotid dopplers without significant ICA stenosis. 2D echo with EF 55-62%, no wall abnormality and grade I diastolic dysfunction. Dr. Erlinda Hong evaluated patient and recommends changing chronic ASA to plavix for stroke likely due to SVD. EKG with frequent PVCs but negative for A fib and outpatient cardiac monitoring arranged with cardiology. Carotid dopplers without significant ICA stenosis. Patient  with resultant left sided weakness and mild flaccid dysarthria. Patient transferred to CIR on 08/23/2014.      Pt presents with decreased activity tolerance, decreased functional mobility, decreased balance, decreased coordination, left inattention, decreased memory Limiting pt's independence with leisure/community pursuits.   Leisure History/Participation Premorbid leisure interest/current participation: Medical laboratory scientific officer - Building control surveyor - Doctor, hospital - Travel (Comment);Sports - Golf Other Leisure Interests: Television Leisure Participation Style: With Family/Friends Awareness of Community Resources: Good-identify 3 post discharge leisure resources Psychosocial / Spiritual Social interaction - Mood/Behavior: Cooperative Academic librarian Appropriate for Education?: Yes Recreational Therapy Orientation Orientation -Reviewed with patient: Available activity resources Strengths/Weaknesses Patient Strengths/Abilities: Willingness to participate;Active premorbidly Patient weaknesses: Physical limitations TR Patient demonstrates impairments in the following area(s): Endurance;Motor;Safety  Plan Rec Therapy Plan Is patient appropriate for Therapeutic Recreation?: Yes Rehab Potential: Good Treatment times per week: Min 1 time per week >20 minutes Estimated Length of Stay: 10 days TR Treatment/Interventions: Adaptive equipment instruction;1:1 session;Balance/vestibular training;Functional mobility training;Community reintegration;Patient/family education;Therapeutic activities;Recreation/leisure participation;Therapeutic exercise  Recommendations for other services: None  Discharge Criteria: Patient will be discharged from TR if patient refuses treatment 3 consecutive times without medical reason.  If treatment goals not met, if there is a change in medical status, if patient makes no progress towards goals or if patient is discharged from hospital.  The above assessment,  treatment plan, treatment alternatives and goals were discussed and mutually agreed upon: by patient  [place] 08/25/2014, 12:06 PM

## 2014-08-25 NOTE — Patient Care Conference (Signed)
Inpatient RehabilitationTeam Conference and Plan of Care Update Date: 08/24/2014   Time: 2:45 PM    Patient Name: Jeffery Campbell      Medical Record Number: [id number]  Date of Birth: December 11, 1928 Sex: Male         Room/Bed: 4M03C/4M03C-01 Payor Info: Payor: HU[employer] MEDICARE / Plan: [employer] GOLD PLUS HMO THN/NTSP / Product Type: *No Product type* /    Admitting Diagnosis: r thalmic CVA  Admit Date/Time:  08/23/2014  4:03 PM Admission Comments: No comment available   Primary Diagnosis:  <principal problem not specified> Principal Problem: <principal problem not specified>  Patient Active Problem List   Diagnosis Date Noted  . Thalamic infarct, acute   . Left hemiparesis   . CVA (cerebral infarction) 08/19/2014  . Stroke 08/19/2014  . Hypertension 08/19/2014  . HLD (hyperlipidemia) 08/19/2014  . CKD (chronic kidney disease) stage 3, GFR 30-59 ml/min 08/19/2014  . Accelerated hypertension   . 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 Obesity   . Asymptomatic PVCs 05/27/2014  . Bradycardia 04/05/2014  . Essential hypertension 04/05/2014    Expected Discharge Date: Expected Discharge Date: 09/01/14  Team Members Present: Physician leading conference: Dr. Faith Rogue Social Worker Present: Amada Jupiter, LCSW Nurse Present: Carmie End, RN PT Present: Bayard Hugger, PT OT Present: Ardis Rowan, Darolyn Rua, OT SLP Present: Feliberto Gottron, SLP PPS Coordinator present : Tora Duck, RN, CRRN     Current Status/Progress Goal Weekly Team Focus  Medical   right thalamic infarct, decreased proprioception and control of left side. bp controlled. cxr clear  improve activity tolerance  bp control, stroke education   Bowel/Bladder   Continent of bowel and bladder; LBM 5/22  Mod I  Maintain current regimen; assess and treat for constipation as needed   Swallow/Nutrition/ Hydration             ADL's   [place]ll Min A for funcitonal mobility, dressing and  transfers, vc for safety  Overall Mod I, supervision for TTB transfer  LUE coordination, FMC of left hand, dynamic standing balance, functional mobility using RW, transfers   Mobility   min-mod A overall using RW or L HHA and 12 stairs with 2 rails   mod I except supervision for community ambulation, car transfer, and stairs  Coordination, L NMR, dynamic standing balance, mobility training, activity tolerance, patient education   Communication             Safety/Cognition/ Behavioral Observations  No unsafe behaviors noted         Pain   No c/o pain  < 3  Assess and treat for pain q shift and prn   Skin   No skin breakdown or infection noted  Mod I  Assess skin q shift and prn    Rehab Goals Patient on target to meet rehab goals: Yes *See Care Plan and progress notes for long and short-term goals.  Barriers to Discharge: safety, balance    Possible Resolutions to Barriers:  ongoing balance training, adaptive equipment    Discharge Planning/Teaching Needs:  home with family able to provide intermittent assistance;  can add more help if needed (neighbor)      Team Discussion:  New eval.  Anticipating mod i goals overall.  Very ataxic and poor attention/ control.  Recommend ST to eval for intelligibility.  No concerns at present.  Revisions to Treatment Plan:  Addition of ST services   Continued Need for Acute Rehabilitation Level of  Care: The patient requires daily medical management by a physician with specialized training in physical medicine and rehabilitation for the following conditions: Daily direction of a multidisciplinary physical rehabilitation program to ensure safe treatment while eliciting the highest outcome that is of practical value to the patient.: Yes Daily medical management of patient stability for increased activity during participation in an intensive rehabilitation regime.: Yes Daily analysis of laboratory values and/or radiology reports with any subsequent  need for medication adjustment of medical intervention for : Neurological problems;Cardiac problems  Cheryl Stabenow 08/25/2014, 10:35 AM

## 2014-08-25 NOTE — Progress Notes (Signed)
Social Work Patient ID: Jeffery BrittleJoe Dusenbery, male   DOB: 01/23/1929, 81 y.o.   MRN: [id number]   Have reviewed team conference with pt and son, [name] OtherGrayling (via phone).  Both aware and agreeable with targeted d/c date of 6/1 with mod i goals.  Both very pleased with progress and son notes pt's "care up there has been great!"  Will follow for support and d/c planning needs.  Ashey Tramontana, LCSW

## 2014-08-26 ENCOUNTER — Inpatient Hospital Stay (HOSPITAL_COMMUNITY): Payer: Commercial Managed Care - HMO

## 2014-08-26 ENCOUNTER — Inpatient Hospital Stay (HOSPITAL_COMMUNITY): Payer: Commercial Managed Care - HMO | Admitting: Speech Pathology

## 2014-08-26 NOTE — Progress Notes (Signed)
Physical Therapy Session Note  Patient Details  Name: Jeffery Campbell MRN: [id number] Date of Birth: Sep 07, 1928  Today's Date: 08/26/2014 PT Individual Time: 0932-1100 PT Individual Time Calculation (min): 88 min   Short Term Goals: Week 1:  PT Short Term Goal 1 (Week 1): = LTGs due to anticipated LOS   Skilled Therapeutic Interventions/Progress Updates:      Patient ambulated with RW x 180' to therapy gym minguard to supervision.  Standing balance/coordiination activity x 3 sets of 10 alternate taps over ski poles holding cane with min assist cues for right weight shift, slower taps with left and foot clearance.  Gait with SPC x 120' cues for sequncing and min assist to minguard for balance, safety.  Gait with SPC around obstacles with cues and min/mod assist cues for left foot clearance.  Gait with RW x 250' with minguard/supervision to retrieve clothing from laundry and back to room.  Seated reaching for gathering clothing and folding mod Independent.  Transfer to bed with RW and supervision, cues for safety.  Sit<>supine supervision.  Performed therex as noted below.  Patient left in bed with all needs in reach and visiting with family.  Therapy Documentation Precautions:  Precautions Precautions: Fall Precaution Comments: left inattention Restrictions Weight Bearing Restrictions: No    Pain: Pain Assessment Pain Assessment: No/denies pain Locomotion : Ambulation Ambulation/Gait Assistance: 5: Supervision;4: Min guard (220')     Exercises: General Exercises - Lower Extremity Hip ABduction/ADduction: Sidelying;10 reps;Strengthening (2 sets with green theraband clamshell on left ) Hip Flexion/Marching: AROM;10 reps;Supine (2 sets) Repetitive Sit to Stands: No upper extremities;10 reps (x 2 sets) Repetitive Sit to Stands Level of Assist: Min Repetitive Sit to Stands Surface Height: bed in pt room slightly elevated Total Joint Exercises Bridges: AROM;10 reps (2 sets)  Left leg  hip/knee control lowering and lifting off edge of bed x 10  See FIM for current functional status  Therapy/Group: Individual Therapy  Rudi CocoWYNN,CYNDI  Cyndi NegauneeWynn, South CarolinaPT [phone] 08/26/2014  08/26/2014, 12:31 PM

## 2014-08-26 NOTE — Progress Notes (Signed)
Physical Therapy Session Note  Patient Details  Name: Jeffery Campbell MRN: [id number] Date of Birth: April 13, 1928  Today's Date: 08/26/2014 PT Individual Time: 1536-1606 PT Individual Time Calculation (min): 30 min   Short Term Goals: Week 1:  PT Short Term Goal 1 (Week 1): = LTGs due to anticipated LOS   Skilled Therapeutic Interventions/Progress Updates:    Patient ambulated 68130' with RW and supervision.  Negotiated 8 steps with 2 rails and supervision self selected sequence.  Practiced car transfer with supervision and cues for safety.  Standing dynamic balance tapping alternate foot up to top of wipes canister 2 x 10 min assist with QC.  Gait with SPC x 170' with min guard assist and practice with tandem gait with cane and hand hold assist mod assist.  Patient left in bed in room all needs in reach.  Therapy Documentation Precautions:  Precautions Precautions: Fall Precaution Comments: left inattention Restrictions Weight Bearing Restrictions: No Pain: Pain Assessment Pain Assessment: No/denies pain   See FIM for current functional status  Therapy/Group: Individual Therapy  Rudi CocoWYNN,CYNDI  Cyndi Arivaca JunctionWynn, [place] CarolinaPT [phone] 08/26/2014  08/26/2014, 5:01 PM

## 2014-08-26 NOTE — Progress Notes (Signed)
Occupational Therapy Session Note  Patient Details  Name: Jeffery Campbell MRN: [id number] Date of Birth: 05/07/1928  Today's Date: 08/26/2014 OT Individual Time: 0700-0800 OT Individual Time Calculation (min): 60 min    Short Term Goals: Week 1:  OT Short Term Goal 1 (Week 1): STG=LTG d/t short LOS  Skilled Therapeutic Interventions/Progress Updates:    Pt engaged in BADL retraining including bathing at shower level and dressing with sit<>stand from EOB.  Pt amb with RW to gather clothing prior to walking into bathroom for shower.  Pt completed bathing at supervision level using grab bars to assist with sit<>stand.  Pt completed dressing tasks at supervision level.  Pt continues to require more than a reasonable amount of time to complete all tasks.  Pt amb with RW to laundry room to wash clothing.  Pt required rest break prior to walking back to room.  Pt continues to require min verbal cues when turning to sit down and rounding corners.  Pt requires min verbal cues to raise head when ambulating.  Focus on activity tolerance, sit<>stand, standing balance, functional amb with RW for home mgmt tasks, and safety awareness.   Therapy Documentation Precautions:  Precautions Precautions: Fall Restrictions Weight Bearing Restrictions: No Pain: Pain Assessment Pain Assessment: No/denies pain ADL: ADL ADL Comments: se  FIM  See FIM for current functional status  Therapy/Group: Individual Therapy  [name], Rosena Bartle Chappell 08/26/2014, 8:03 AM

## 2014-08-26 NOTE — Progress Notes (Signed)
[facility] PHYSICAL MEDICINE & REHABILITATION     PROGRESS NOTE    Subjective/Complaints: Up in shower. Doing well.   Pt denies nausea, vomiting, abdominal pain, diarrhea, chest pain, shortness of breath, palpitations, dizziness   Objective: Vital Signs: Blood pressure 155/80, pulse 65, temperature 98.2 F (36.8 C), temperature source Oral, resp. rate 18, height 6\' 1"  (1.854 m), weight 111.3 kg (245 lb 6 oz), SpO2 90 %. No results found.  Recent Labs  08/24/14 0620  WBC 7.9  HGB 17.3*  HCT 48.9  PLT 181    Recent Labs  08/24/14 0620  NA 138  K 3.9  CL 105  GLUCOSE 103*  BUN 19  CREATININE 1.27*  CALCIUM 9.1   CBG (last 3)  No results for input(s): GLUCAP in the last 72 hours.  Wt Readings from Last 3 Encounters:  08/23/14 111.3 kg (245 lb 6 oz)  05/24/14 115.327 kg (254 lb 4 oz)  04/05/14 115.781 kg (255 lb 4 oz)    Physical Exam:  Constitutional: He is oriented to person, place, and time. He appears well-developed and well-nourished.  HENT: oral mucosa appears dry Head: Normocephalic and atraumatic.  Eyes: Conjunctivae are normal. Pupils are equal, round, and reactive to light.  Neck: Normal range of motion. Neck supple.  Cardiovascular: Normal rate and regular rhythm. no murmur Respiratory: Effort normal and breath sounds normal. No respiratory distress. Minimal wheezing.  GI: Soft. Bowel sounds are normal. He exhibits no distension. There is no tenderness.  Musculoskeletal: He exhibits no edema or tenderness.  Neurological: He is alert and oriented to person, place, and time. Normal insight and awareness. He has normal reflexes.  Left central 7 and tongue deviation. Speech slightly slurred but intelligible.   LUE and LLE proprioceptive deficits improving. Left upper and left lower limb ataxia improving. Motor: 4+/5 left deltoid, bicep, tricep, hi, hip, knee, ankle.  Skin: Skin is warm and dry.  Psychiatric: He has a normal mood and affect. His  behavior is normal. Judgment and thought content normal.   Assessment/Plan: 1. Functional deficits secondary to right thalamic/PLIC infarct which require 3+ hours per day of interdisciplinary therapy in a comprehensive inpatient rehab setting. Physiatrist is providing close team supervision and 24 hour management of active medical problems listed below. Physiatrist and rehab team continue to assess barriers to discharge/monitor patient progress toward functional and medical goals. FIM: FIM - Bathing Bathing Steps Patient Completed: Chest, Right Arm, Left Arm, Abdomen, Front perineal area, Right upper leg, Buttocks, Left upper leg, Right lower leg (including foot), Left lower leg (including foot) Bathing: 4: Steadying assist  FIM - Upper Body Dressing/Undressing Upper body dressing/undressing steps patient completed: Thread/unthread right sleeve of pullover shirt/dresss, Thread/unthread left sleeve of pullover shirt/dress, Put head through opening of pull over shirt/dress, Pull shirt over trunk Upper body dressing/undressing: 5: Supervision: Safety issues/verbal cues FIM - Lower Body Dressing/Undressing Lower body dressing/undressing steps patient completed: Thread/unthread right underwear leg, Thread/unthread left underwear leg, Pull underwear up/down, Thread/unthread right pants leg, Thread/unthread left pants leg, Pull pants up/down, Don/Doff right sock, Don/Doff left sock, Don/Doff right shoe, Don/Doff left shoe, Fasten/unfasten right shoe, Fasten/unfasten left shoe Lower body dressing/undressing: 4: Steadying Assist  FIM - Toileting Toileting steps completed by patient: Adjust clothing prior to toileting, Performs perineal hygiene, Adjust clothing after toileting Toileting: 4: Steadying assist  FIM - Diplomatic Services operational officerToilet Transfers Toilet Transfers Assistive Devices: Bedside commode, Environmental consultantWalker, Grab bars Toilet Transfers: 4-To toilet/BSC: Min A (steadying Pt. > 75%), 4-From toilet/BSC: Min A (  steadying Pt.  > 75%)  FIM - Bed/Chair Transfer Bed/Chair Transfer Assistive Devices: Walker, Arm rests Bed/Chair Transfer: 5: Supine > Sit: Supervision (verbal cues/safety issues), 5: Sit > Supine: Supervision (verbal cues/safety issues), 4: Bed > Chair or W/C: Min A (steadying Pt. > 75%), 4: Chair or W/C > Bed: Min A (steadying Pt. > 75%)  FIM - Locomotion: Wheelchair Distance: 130 ft Locomotion: Wheelchair: 1: Total Assistance/staff pushes wheelchair (Pt<25%) FIM - Locomotion: Ambulation Locomotion: Ambulation Assistive Devices: Designer, industrial/product Ambulation/Gait Assistance: 3: Mod assist Locomotion: Ambulation: 4: Travels 150 ft or more with minimal assistance (Pt.>75%)  Comprehension Comprehension Mode: Auditory Comprehension: 6-Follows complex conversation/direction: With extra time/assistive device  Expression Expression Mode: Verbal Expression: 6-Expresses complex ideas: With extra time/assistive device  Social Interaction Social Interaction: 6-Interacts appropriately with others with medication or extra time (anti-anxiety, antidepressant).  Problem Solving Problem Solving: 5-Solves basic problems: With no assist  Memory Memory: 6-More than reasonable amt of time  Medical Problem List and Plan: 1. Functional deficits secondary to right thalamic/PLIC infarct 2. DVT Prophylaxis/Anticoagulation: Pharmaceutical: Lovenox 3. Pain Management: Off meloxicam. Tylenol prn for OA pain. Continue Flexeril at bedtime.  4. Mood: Continue Celexa daily with valium prn. Remains quite otivated and no signs of distress noted.  5. Neuropsych: This patient is capable of making decisions on his own behalf. 6. Skin/Wound Care: Routine pressure relief measures.  7. Fluids/Electrolytes/Nutrition: encourage adequate PO intake  -keep an eye on Cr----recheck next week 8. HTN: Monitor every 8 hours. Continue Cardizem and Hytrin. Avoid hypotension.  9. GERD: symptoms stable on protonix.    LOS (Days)  3 A FACE TO FACE EVALUATION WAS PERFORMED  Jeffery Campbell T 08/26/2014 7:27 AM

## 2014-08-26 NOTE — Evaluation (Signed)
Speech Language Pathology Assessment and Plan  Patient Details  Name: Jeffery Campbell MRN: [id number] Date of Birth: [birth year]  SLP Diagnosis: N/A Rehab Potential: N/A ELOS: N/A    Today's Date: 08/26/2014 SLP Individual Time: 7829-5621 SLP Individual Time Calculation (min): 45 min   Problem List:  Patient Active Problem List   Diagnosis Date Noted  . Thalamic infarct, acute   . Left hemiparesis   . CVA (cerebral infarction) 08/19/2014  . Stroke 08/19/2014  . Hypertension 08/19/2014  . HLD (hyperlipidemia) 08/19/2014  . CKD (chronic kidney disease) stage 3, GFR 30-59 ml/min 08/19/2014  . Accelerated hypertension   . 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 Obesity   . Asymptomatic PVCs 05/27/2014  . Bradycardia 04/05/2014  . Essential hypertension 04/05/2014   Past Medical History:  Past Medical History  Diagnosis Date  . Diverticulitis   . Hypertension   . Pure hypercholesterolemia   . Hemorrhoids   . OA (osteoarthritis)   . Disturbance of sleep    Past Surgical History:  Past Surgical History  Procedure Laterality Date  . Transurethral resection of prostate    . Knee arthroscopy    . Colonoscopy      Assessment / Plan / Recommendation Clinical Impression Patient is an 81 y.o. male with history of HTN, OA, bradycardia, anxiety d/o; who was admitted on 08/19/14 with left sided weakness and fall with inability to walk. CT head without acute changes. MRI/MRA brain done revealing acute ischemia right lateral thalamus v/s posterior limb of internal capsule. Carotid dopplers without significant ICA stenosis. 2D echo with EF 55-62%, no wall abnormality and grade I diastolic dysfunction. Dr. Erlinda Hong evaluated patient and recommends changing chronic ASA to plavix for stroke likely due to SVD. EKG with frequent PVCs but negative for A fib and outpatient cardiac monitoring arranged with cardiology. Carotid dopplers without significant ICA stenosis. CIR  recommended by MD and rehab team and patient admitted 08/23/14. SLP asked to evaluate patient today to assess possible dysarthria. Patient's oral-motor musculature appear WFL in regards to strength and ROM and patient was 100% intelligible at the sentence and conversation level. Patient has an increased rate of speech, however, this is his baseline dialect and is not a result of his current stroke. Both the patient and his son report the patient's speech is at baseline, therefore, skilled SLP intervention is not warranted at this time.   Skilled Therapeutic Interventions          Administered a speech evaluation. Please see above for details. Educated the patient and his son in regards to his current speech intelligibility. Both verbalized understanding and agreement.   SLP Assessment  Patient does not need any further Speech Lanaguage Pathology Services    Recommendations  Oral Care Recommendations: Oral care BID Patient destination: Home Follow up Recommendations: None Equipment Recommended: None recommended by SLP    SLP Frequency N/A  SLP Treatment/Interventions N/A   Pain No/Denies Pain  Short Term Goals: N/A  See FIM for current functional status Refer to Care Plan for Long Term Goals  Recommendations for other services: None  Discharge Criteria: Patient will be discharged from SLP if patient refuses treatment 3 consecutive times without medical reason, if treatment goals not met, if there is a change in medical status, if patient makes no progress towards goals or if patient is discharged from hospital.  The above assessment, treatment plan, treatment alternatives and goals were discussed and mutually agreed upon: by patient and  by family  Stanislawa Gaffin 08/26/2014, 3:38 PM

## 2014-08-27 ENCOUNTER — Inpatient Hospital Stay (HOSPITAL_COMMUNITY): Payer: Commercial Managed Care - HMO

## 2014-08-27 DIAGNOSIS — I63339 Cerebral infarction due to thrombosis of unspecified posterior cerebral artery: Secondary | ICD-10-CM

## 2014-08-27 NOTE — Progress Notes (Signed)
Occupational Therapy Session Note  Patient Details  Name: Jeffery Campbell MRN: [id number] Date of Birth: 01/21/1929  Today's Date: 08/27/2014 OT Individual Time: 1530-1600 OT Individual Time Calculation (min): 30 min    Short Term Goals: Week 1:  OT Short Term Goal 1 (Week 1): STG=LTG d/t short LOS  Skilled Therapeutic Interventions/Progress Updates:    Pt seen for 1:1 OT session with focus on sit<>stand, functional mobility, and safety awareness. Pt received sitting EOB. Ambulated to therapy gym at supervision level using RW. Completed sit<>stand 3 sets x5 while holding 3# weight to keep from pt using UE support. Pt required min-SBA for sit<>stands with rest breaks between each set. Completed obstacle course with focus on stepping over and around items to simulate threshold in doorways and navigating around obstacles in home environment. Pt completed at CGA-SBA level with min cues for safety with RW. Pt returned to room and left supine in bed with all needs in reach.   Therapy Documentation Precautions:  Precautions Precautions: Fall Precaution Comments: left inattention Restrictions Weight Bearing Restrictions: No General:   Vital Signs: Therapy Vitals Temp: 97.8 F (36.6 C) Temp Source: Oral Pulse Rate: 68 Resp: 18 BP: 125/62 mmHg Patient Position (if appropriate): Lying Oxygen Therapy SpO2: 95 % O2 Device: Not Delivered Pain: Pain Assessment Pain Assessment: No/denies pain  See FIM for current functional status  Therapy/Group: Individual Therapy  Daneil Danerkinson, Aprile Dickenson N 08/27/2014, 4:06 PM

## 2014-08-27 NOTE — Progress Notes (Signed)
Occupational Therapy Session Note  Patient Details  Name: Jeffery Campbell Matchett MRN: [id number] Date of Birth: 08-15-1928  Today's Date: 08/27/2014 OT Individual Time: 1400-1430 OT Individual Time Calculation (min): 30 min    Short Term Goals: Week 1:  OT Short Term Goal 1 (Week 1): STG=LTG d/t short LOS  Skilled Therapeutic Interventions/Progress Updates: Therapeutic activity with focus on dynamic standing balance, transfers, functional mobility, and attention and awareness in distracting environment (community, simulated).   Pt received supine in bed, alert and oriented, awaiting therapist.   With written instructions provided and min vc, pt was challenged to navigate from his room, 4th floor, midwest, room 3, to gift shop to locate magazines using RW.    Pt required initial orientation to sign locations at walls and ceiling and proceeded to gift shop with min vc and intermittent steadying assist when opening doors.   Pt demo'd LOB 3 times during change of flooring d/t LLE weakness resulting in foot dragging.   Pt passed by magazines and was redirected once in the gift shop.   From gift shop patient sat a dining tables briefly to rest but stumbled when rising d/t no arm rests.   With min instrucitonal cues pt returned to sitting and rose unassisted but again required steadying assist to maintain dynamic standing balance. Pt ambulated to public toilet, transferred to toilet with supervision and ambulated back to his room passing through garden area with min assist to problem-solve at door entrance to lobby d/t loose carpeting.    Pt reports that he does not plan to leave his home unescorted after discharge and he prefers RW over cane at this time.     Therapy Documentation Precautions:  Precautions Precautions: Fall Precaution Comments: left inattention Restrictions Weight Bearing Restrictions: No   Vital Signs: Therapy Vitals Temp: 97.8 F (36.6 C) Temp Source: Oral Pulse Rate: 68 Resp: 18 BP:  125/62 mmHg Patient Position (if appropriate): Lying Oxygen Therapy SpO2: 95 % O2 Device: Not Delivered   Pain: Pain Assessment Pain Assessment: No/denies pain   ADL: ADL ADL Comments: se  FIM  See FIM for current functional status  Therapy/Group: Individual Therapy   Jonte Shiller 08/27/2014, 2:37 PM

## 2014-08-27 NOTE — Progress Notes (Signed)
[facility] PHYSICAL MEDICINE & REHABILITATION     PROGRESS NOTE    Subjective/Complaints: In shower with OT  Pt denies nausea, vomiting, abdominal pain, diarrhea, chest pain, shortness of breath, palpitations, dizziness   Objective: Vital Signs: Blood pressure 140/61, pulse 60, temperature 98.2 F (36.8 C), temperature source Oral, resp. rate 18, height 6\' 1"  (1.854 m), weight 111.3 kg (245 lb 6 oz), SpO2 94 %. No results found. No results for input(s): WBC, HGB, HCT, PLT in the last 72 hours. No results for input(s): NA, K, CL, GLUCOSE, BUN, CREATININE, CALCIUM in the last 72 hours.  Invalid input(s): CO CBG (last 3)  No results for input(s): GLUCAP in the last 72 hours.  Wt Readings from Last 3 Encounters:  08/23/14 111.3 kg (245 lb 6 oz)  05/24/14 115.327 kg (254 lb 4 oz)  04/05/14 115.781 kg (255 lb 4 oz)    Physical Exam:  Constitutional: He is oriented to person, place, and time. He appears well-developed and well-nourished.  HENT: oral mucosa appears dry Head: Normocephalic and atraumatic.  Eyes: Conjunctivae are normal. Pupils are equal, round, and reactive to light.  Neck: Normal range of motion. Neck supple.  Cardiovascular: Normal rate and regular rhythm. no murmur Respiratory: Effort normal and breath sounds normal. No respiratory distress. Minimal wheezing.  GI: Soft. Bowel sounds are normal. He exhibits no distension. There is no tenderness.  Musculoskeletal: He exhibits no edema or tenderness.  Neurological: He is alert and oriented to person, place, and time. Normal insight and awareness. He has normal reflexes.  Left central 7 and tongue deviation. Speech slightly slurred but intelligible.   LUE and LLE proprioceptive deficits improving. Left upper and left lower limb ataxia improving. Motor: 4+/5 left deltoid, bicep, tricep, hi, hip, knee, ankle.  Skin: Skin is warm and dry.  Psychiatric: He has a normal mood and affect. His behavior is normal.  Judgment and thought content normal.   Assessment/Plan: 1. Functional deficits secondary to right thalamic/PLIC infarct which require 3+ hours per day of interdisciplinary therapy in a comprehensive inpatient rehab setting. Physiatrist is providing close team supervision and 24 hour management of active medical problems listed below. Physiatrist and rehab team continue to assess barriers to discharge/monitor patient progress toward functional and medical goals. FIM: FIM - Bathing Bathing Steps Patient Completed: Chest, Right Arm, Left Arm, Abdomen, Front perineal area, Right upper leg, Buttocks, Left upper leg, Right lower leg (including foot), Left lower leg (including foot) Bathing: 5: Supervision: Safety issues/verbal cues  FIM - Upper Body Dressing/Undressing Upper body dressing/undressing steps patient completed: Thread/unthread right sleeve of pullover shirt/dresss, Thread/unthread left sleeve of pullover shirt/dress, Put head through opening of pull over shirt/dress, Pull shirt over trunk Upper body dressing/undressing: 5: Supervision: Safety issues/verbal cues FIM - Lower Body Dressing/Undressing Lower body dressing/undressing steps patient completed: Thread/unthread right underwear leg, Thread/unthread left underwear leg, Pull underwear up/down, Thread/unthread right pants leg, Thread/unthread left pants leg, Pull pants up/down, Don/Doff right sock, Don/Doff left sock, Don/Doff right shoe, Don/Doff left shoe, Fasten/unfasten right shoe, Fasten/unfasten left shoe Lower body dressing/undressing: 5: Supervision: Safety issues/verbal cues  FIM - Toileting Toileting steps completed by patient: Adjust clothing prior to toileting, Performs perineal hygiene, Adjust clothing after toileting Toileting: 4: Steadying assist  FIM - Diplomatic Services operational officerToilet Transfers Toilet Transfers Assistive Devices: Bedside commode, Environmental consultantWalker, Grab bars Toilet Transfers: 4-To toilet/BSC: Min A (steadying Pt. > 75%), 4-From  toilet/BSC: Min A (steadying Pt. > 75%)  FIM - BankerBed/Chair Transfer Bed/Chair Transfer Assistive Devices:  Walker Bed/Chair Transfer: 4: Chair or W/C > Bed: Min A (steadying Pt. > 75%)  FIM - Locomotion: Wheelchair Distance: 130 ft Locomotion: Wheelchair: 1: Total Assistance/staff pushes wheelchair (Pt<25%) FIM - Locomotion: Ambulation Locomotion: Ambulation Assistive Devices: Emergency planning/management officer, Designer, industrial/product Ambulation/Gait Assistance: 5: Supervision, 4: Min guard (220') Locomotion: Ambulation: 4: Travels 150 ft or more with minimal assistance (Pt.>75%)  Comprehension Comprehension Mode: Auditory Comprehension: 6-Follows complex conversation/direction: With extra time/assistive device  Expression Expression Mode: Verbal Expression: 6-Expresses complex ideas: With extra time/assistive device  Social Interaction Social Interaction: 6-Interacts appropriately with others with medication or extra time (anti-anxiety, antidepressant).  Problem Solving Problem Solving: 5-Solves complex 90% of the time/cues < 10% of the time  Memory Memory: 6-More than reasonable amt of time  Medical Problem List and Plan: 1. Functional deficits secondary to right thalamic/PLIC infarct 2. DVT Prophylaxis/Anticoagulation: Pharmaceutical: Lovenox 3. Pain Management: Off meloxicam. Tylenol prn for OA pain. Continue Flexeril at bedtime.  4. Mood: Continue Celexa daily with valium prn. Remains quite otivated and no signs of distress noted.  5. Neuropsych: This patient is capable of making decisions on his own behalf. 6. Skin/Wound Care: Routine pressure relief measures.  7. Fluids/Electrolytes/Nutrition: encourage adequate PO intake  -keep an eye on Cr----recheck next week 8. HTN: Monitor every 8 hours. Continue Cardizem and Hytrin. Avoid hypotension.  9. GERD: symptoms stable on protonix.    LOS (Days) 4 A FACE TO FACE EVALUATION WAS PERFORMED  Erick Colace 08/27/2014 7:11 AM

## 2014-08-27 NOTE — Progress Notes (Signed)
Occupational Therapy Session Note  Patient Details  Name: Jeffery Campbell MRN: [id number] Date of Birth: [birth year]  Today's Date: 08/27/2014 OT Individual Time: 0700-0800 OT Individual Time Calculation (min): 60 min    Short Term Goals: Week 1:  OT Short Term Goal 1 (Week 1): STG=LTG d/t short LOS  Skilled Therapeutic Interventions/Progress Updates:    Pt engaged in BADL retraining including bathing at shower level and dressing with sit<>stand from EOB.  Pt performed all tasks at supervision level with min verbal cues for safety awareness.  Pt requires more than reasonable amount of time to complete tasks.  Pt amb with RW to therapy gym and engaged in [facility] tasks including manipulating nuts and bolts and removing medicine bottle caps and replacing.  Although pt continues to exhibit decreased coordination using left hand, he continues to exhibit improvement.  Focus on BADLs, safety awareness, functional amb with RW, sit<>stand, standing balance, FMC tasks, BUE use for functional tasks, and activity tolerance.  Therapy Documentation Precautions:  Precautions Precautions: Fall Precaution Comments: left inattention Restrictions Weight Bearing Restrictions: No Pain: Pain Assessment Pain Assessment: No/denies pain ADL: ADL ADL Comments: se  FIM  See FIM for current functional status  Therapy/Group: Individual Therapy  [name], Aaryana Betke Chappell 08/27/2014, 8:01 AM

## 2014-08-27 NOTE — Progress Notes (Signed)
Physical Therapy Session Note  Patient Details  Name: Jeffery Campbell MRN: [id number] Date of Birth: 08-03-1928  Today's Date: 08/27/2014 PT Individual Time: 0830-1000 PT Individual Time Calculation (min): 90 min   Short Term Goals: Week 1:  PT Short Term Goal 1 (Week 1): = LTGs due to anticipated LOS   Skilled Therapeutic Interventions/Progress Updates:    Patient practiced ambulation with walker, cane and no device over level controlled and simulated uneven terrain with supervision w/walker on level surfaces and min assist with cane on uneven terrain.  Negotiated around obstacles with cane with min assist to retrieve golf balls after putting practice only one cue given for left side awareness for safety.  Standing dynamic balance without UE support min assist putting golf balls cues for weight shift, posture and left LE position. Also work on picking items up from floor with min assist with cane. Patient negotiated 5 steps with railing and cane supervision with self selected sequence to ascend (step to versus step through,) and cues for step to sequence to descend.  Gait without devices x 200' facilitation/min assist for trunk rotation and weight shift.  Performed Berg balance and TUG fall risk assessments.  Noted improved score on Berg by 7 points.  At end of session patient left in room in recliner with all needs in reach.  Therapy Documentation Precautions:  Precautions Precautions: Fall Precaution Comments: left inattention Restrictions Weight Bearing Restrictions: No    Pain: Pain Assessment Pain Assessment: No/denies pain Locomotion : Ambulation Ambulation/Gait Assistance: 5: Supervision;4: Min guard (220' S w/RW; Minguard w/cane)   Balance: Standardized Balance Assessment Standardized Balance Assessment: Berg Balance Test Berg Balance Test Sit to Stand: Able to stand  independently using hands Standing Unsupported: Able to stand safely 2 minutes Sitting with Back Unsupported  but Feet Supported on Floor or Stool: Able to sit safely and securely 2 minutes Stand to Sit: Controls descent by using hands Transfers: Able to transfer safely, minor use of hands Standing Unsupported with Eyes Closed: Able to stand 10 seconds with supervision Standing Ubsupported with Feet Together: Able to place feet together independently and stand for 1 minute with supervision From Standing, Reach Forward with Outstretched Arm: Can reach forward >12 cm safely (5") From Standing Position, Pick up Object from Floor: Able to pick up shoe, needs supervision From Standing Position, Turn to Look Behind Over each Shoulder: Looks behind from both sides and weight shifts well Turn 360 Degrees: Needs assistance while turning Standing Unsupported, Alternately Place Feet on Step/Stool: Able to complete >2 steps/needs minimal assist Standing Unsupported, One Foot in Front: Able to take small step independently and hold 30 seconds Standing on One Leg: Tries to lift leg/unable to hold 3 seconds but remains standing independently Total Score: 38 Timed Up and Go Test TUG: Normal TUG Normal TUG (seconds): 33 (with cane, 26 with RW)  See FIM for current functional status  Therapy/Group: Individual Therapy  Rudi CocoWYNN,CYNDI  Cyndi AlbanyWynn, South CarolinaPT [phone] 08/27/2014  08/27/2014, 12:19 PM

## 2014-08-28 DIAGNOSIS — K219 Gastro-esophageal reflux disease without esophagitis: Secondary | ICD-10-CM

## 2014-08-28 NOTE — Progress Notes (Signed)
Jeffery Campbell is a 81 y.o. male [birth year] [id number]  Subjective: No new complaints. No new problems. Slept well. Feeling OK.  Objective: Vital signs in last 24 hours: Temp:  [97.8 F (36.6 C)] 97.8 F (36.6 C) (05/28 0555) Pulse Rate:  [60-68] 60 (05/28 0555) Resp:  [18] 18 (05/28 0555) BP: (125-141)/(62-72) 141/72 mmHg (05/28 0555) SpO2:  [90 %-95 %] 90 % (05/28 0555) Weight change:  Last BM Date: 08/26/14  Intake/Output from previous day: 05/27 0701 - 05/28 0700 In: 720 [P.O.:720] Out: 400 [Urine:400] Last cbgs: CBG (last 3)  No results for input(s): GLUCAP in the last 72 hours.   Physical Exam General: No apparent distress   HEENT: not dry Lungs: Normal effort. Lungs clear to auscultation, no crackles or wheezes. Cardiovascular: Regular rate and rhythm, no edema Abdomen: S/NT/ND; BS(+) Musculoskeletal:  unchanged Neurological: No new neurological deficits Wounds: N/A    Skin: clear  Aging changes Mental state: Alert, oriented, cooperative    Lab Results: BMET    Component Value Date/Time   NA 138 08/24/2014 0620   K 3.9 08/24/2014 0620   CL 105 08/24/2014 0620   CO2 25 08/24/2014 0620   GLUCOSE 103* 08/24/2014 0620   BUN 19 08/24/2014 0620   CREATININE 1.27* 08/24/2014 0620   CALCIUM 9.1 08/24/2014 0620   GFRNONAA 50* 08/24/2014 0620   GFRAA 59* 08/24/2014 0620   CBC    Component Value Date/Time   WBC 7.9 08/24/2014 0620   RBC 5.44 08/24/2014 0620   HGB 17.3* 08/24/2014 0620   HCT 48.9 08/24/2014 0620   PLT 181 08/24/2014 0620   MCV 89.9 08/24/2014 0620   MCH 31.8 08/24/2014 0620   MCHC 35.4 08/24/2014 0620   RDW 12.3 08/24/2014 0620   LYMPHSABS 1.6 08/24/2014 0620   MONOABS 0.7 08/24/2014 0620   EOSABS 0.3 08/24/2014 0620   BASOSABS 0.0 08/24/2014 0620    Studies/Results: No results found.  Medications: I have reviewed the patient's current medications.  Assessment/Plan:  1. Functional deficits secondary to right thalamic/PLIC  infarct 2. DVT Prophylaxis/Anticoagulation: Pharmaceutical: Lovenox 3. Pain Management: Off meloxicam. Tylenol prn for OA pain. Continue Flexeril at bedtime.  4. Mood: Continue Celexa daily with valium prn. Remains quite otivated and no signs of distress noted.  5. Neuropsych: This patient is capable of making decisions on his own behalf. 6. Skin/Wound Care: Routine pressure relief measures.  7. Fluids/Electrolytes/Nutrition: encourage adequate PO intake -keep an eye on Cr----recheck next week 8. HTN: Monitor every 8 hours. Continue Cardizem and Hytrin. Avoid hypotension.  9. GERD: symptoms stable on protonix.     Length of stay, days: 5  Sonda PrimesAlex Song Myre , MD 08/28/2014, 8:24 AM

## 2014-08-29 ENCOUNTER — Inpatient Hospital Stay (HOSPITAL_COMMUNITY): Payer: Commercial Managed Care - HMO | Admitting: *Deleted

## 2014-08-29 ENCOUNTER — Inpatient Hospital Stay (HOSPITAL_COMMUNITY): Payer: Commercial Managed Care - HMO

## 2014-08-29 ENCOUNTER — Inpatient Hospital Stay (HOSPITAL_COMMUNITY): Payer: Commercial Managed Care - HMO | Admitting: Physical Therapy

## 2014-08-29 MED ORDER — WHITE PETROLATUM GEL
Status: AC
  Administered 2014-08-29: 18:00:00
  Filled 2014-08-29: qty 1

## 2014-08-29 NOTE — Progress Notes (Signed)
Physical Therapy Note  Patient Details  Name: Jeffery Campbell MRN: [id number] Date of Birth: May 26, 1928 Today's Date: 08/29/2014    Time: 1015-1100 45 minutes  1:1 no c/o pain.  Gait with RW with close supervision 150'x2 with no LOB, cues to lift L LE when fatigued.  Gait without AD for balance training with HHA 2 x 50' min A for balance, tactile cues for wt shift and L LE clearance.  Side and backward stepping with min A, manual facilitaiton for wt shift to clear L LE.  Stepping over objects forward and sideways with noticeable fatigue in L LE .Step up/down with L LE for strengthening, min A for balance.  Mini squats for hip/knee control with assist for balance.  Pt with good motivation to improve   DONAWERTH,KAREN 08/29/2014, 11:02 AM

## 2014-08-29 NOTE — Progress Notes (Signed)
Physical Therapy Session Note  Patient Details  Name: Jeffery Campbell MRN: [id number] Date of Birth: 1928/04/09  Today's Date: 08/29/2014   Skilled Therapeutic Interventions/Progress Updates:  Session I 0901-1001 (60 min) Patient finished his OT session and is in agreement to participate in therapy session with this therapist. Gait Training with FWW on extended distances with CGA, and w/o any AD on level surface and on carpet with CGA to min A with turns and coming to sit, increased cues for posture attention to L foot and to decrease exaggerated hip flexion on L with swing through.  NuStep 1 x 4 min and 1 x 4 min with rest break between ,resistance level 6 , activity preformed in order to increase coordination and muscular strength. Instruction and training in maneuvering up and down stairs with B rails in a reciprocal manner with CGA. Balance training while standing on foam board-turning R and L with CGA to prevent balance loss, toe tapping a cone to facilitate weight shifting and maintaining balance while standing on one leg on balance board. Picking up cones from floor while on foam boars CGA to min A to maintain balance. Patient returned to room , resting in recliner, all needs within reach. No c/o pain or discomfort during session.  Session II 1425-1455 (30 min ) Session focused on training of gait w/o AD , min assistance needed and cues for posture step length and precision of movement. OTAGO exercises for B LE with 3 lbs on B ankles to increase strength and balance with all functional mobility and initiate HEP training. Balance training with walking side ways and backward with min to mod A in order to reduce risk for falls and improve gait.Patient returned to room , left sitting EOb with all needs within reach.  Therapy Documentation Precautions:  Precautions Precautions: Fall Precaution Comments: left inattention Restrictions Weight Bearing Restrictions: No  See FIM for current  functional status  Therapy/Group: Individual Therapy  Dorna MaiCzajkowska, Kahlia Lagunes W 08/29/2014, 4:04 PM

## 2014-08-29 NOTE — Progress Notes (Signed)
Occupational Therapy Session Note  Patient Details  Name: Jeffery Campbell MRN: [id number] Date of Birth: 01/13/1929  Today's Date: 08/29/2014 OT Individual Time: 0800-0900 OT Individual Time Calculation (min): 60 min    Short Term Goals: Week 1:  OT Short Term Goal 1 (Week 1): STG=LTG d/t short LOS  Skilled Therapeutic Interventions/Progress Updates:    Pt engaged in BADL retraining including bathing at shower level and dressing with sit<>stand from EOB. Pt requires more than reasonable amount of time to complete all tasks with min verbal cues for safety awareness when ambulating in close quarters or crossing thresh holds. Pt utilizes grab bars when standing.  Pt amb with RW from room to laundry room to place soiled clothing in washing machine.  Pt transitioned to therapy gym and engaged in dynamic standing tasks on compliant surface (reaching for horse shoes and tossing at staub).  Pt amb with RW to gather horse shoes from floor.  Pt did not exhibit any unsafe behaviors during session.  Pt returned to recliner in room with all needs within reach. Focus on activity tolerance, sit<>stand, standing balance, functional amb with RW for home mgmt tasks, and safety awareness.  Therapy Documentation Precautions:  Precautions Precautions: Fall Precaution Comments: left inattention Restrictions Weight Bearing Restrictions: No Pain: Pain Assessment Pain Assessment: No/denies pain ADL: ADL ADL Comments: se  FIM See FIM for current functional status  Therapy/Group: Individual Therapy  [name], Marquise Lambson Chappell 08/29/2014, 9:01 AM

## 2014-08-29 NOTE — Progress Notes (Signed)
Jeffery Campbell is a 81 y.o. male [birth year] [id number]  Subjective: No new complaints. No new problems. Slept well.   Objective: Vital signs in last 24 hours: Temp:  [97.5 F (36.4 C)-97.6 F (36.4 C)] 97.6 F (36.4 C) (05/29 0540) Pulse Rate:  [52-64] 64 (05/29 0540) Resp:  [18] 18 (05/29 0540) BP: (126-149)/(72-84) 149/84 mmHg (05/29 0540) SpO2:  [91 %-94 %] 91 % (05/29 0540) Weight change:  Last BM Date: 08/26/14  Intake/Output from previous day: 05/28 0701 - 05/29 0700 In: 720 [P.O.:720] Out: 200 [Urine:200] Last cbgs: CBG (last 3)  No results for input(s): GLUCAP in the last 72 hours.   Physical Exam General: No apparent distress   HEENT: not dry Lungs: Normal effort. Lungs clear to auscultation, no crackles or wheezes. Cardiovascular: Regular rate and rhythm, no edema Abdomen: S/NT/ND; BS(+) Musculoskeletal:  unchanged Neurological: No new neurological deficits Wounds: N/A    Skin: clear  Aging changes Mental state: Alert, oriented, cooperative    Lab Results: BMET    Component Value Date/Time   NA 138 08/24/2014 0620   K 3.9 08/24/2014 0620   CL 105 08/24/2014 0620   CO2 25 08/24/2014 0620   GLUCOSE 103* 08/24/2014 0620   BUN 19 08/24/2014 0620   CREATININE 1.27* 08/24/2014 0620   CALCIUM 9.1 08/24/2014 0620   GFRNONAA 50* 08/24/2014 0620   GFRAA 59* 08/24/2014 0620   CBC    Component Value Date/Time   WBC 7.9 08/24/2014 0620   RBC 5.44 08/24/2014 0620   HGB 17.3* 08/24/2014 0620   HCT 48.9 08/24/2014 0620   PLT 181 08/24/2014 0620   MCV 89.9 08/24/2014 0620   MCH 31.8 08/24/2014 0620   MCHC 35.4 08/24/2014 0620   RDW 12.3 08/24/2014 0620   LYMPHSABS 1.6 08/24/2014 0620   MONOABS 0.7 08/24/2014 0620   EOSABS 0.3 08/24/2014 0620   BASOSABS 0.0 08/24/2014 0620    Studies/Results: No results found.  Medications: I have reviewed the patient's current medications.  Assessment/Plan:  1. Functional deficits secondary to right thalamic/PLIC  infarct 2. DVT Prophylaxis/Anticoagulation: Pharmaceutical: Lovenox 3. Pain Management: Off meloxicam. Tylenol prn for OA pain. Continue Flexeril at bedtime.  4. Mood: Continue Celexa daily with valium prn. Remains quite otivated and no signs of distress noted.  5. Neuropsych: This patient is capable of making decisions on his own behalf. 6. Skin/Wound Care: Routine pressure relief measures.  7. Fluids/Electrolytes/Nutrition: encourage adequate PO intake -keep an eye on Cr----recheck next week 8. HTN: Monitor every 8 hours. Continue Cardizem and Hytrin. Avoid hypotension.  9. GERD: symptoms stable on protonix.   Cont current Rx    Length of stay, days: 6  Jeffery Campbell , MD 08/29/2014, 8:53 AM

## 2014-08-30 ENCOUNTER — Inpatient Hospital Stay (HOSPITAL_COMMUNITY): Payer: Commercial Managed Care - HMO

## 2014-08-30 ENCOUNTER — Encounter (HOSPITAL_COMMUNITY): Payer: Commercial Managed Care - HMO | Admitting: *Deleted

## 2014-08-30 LAB — BASIC METABOLIC PANEL
Anion gap: 11 (ref 5–15)
BUN: 23 mg/dL — ABNORMAL HIGH (ref 6–20)
CALCIUM: 9.3 mg/dL (ref 8.9–10.3)
CHLORIDE: 104 mmol/L (ref 101–111)
CO2: 25 mmol/L (ref 22–32)
Creatinine, Ser: 1.55 mg/dL — ABNORMAL HIGH (ref 0.61–1.24)
GFR calc non Af Amer: 40 mL/min — ABNORMAL LOW (ref >60)
GFR, EST AFRICAN AMERICAN: 46 mL/min — AB (ref >60)
GLUCOSE: 134 mg/dL — AB (ref 65–99)
POTASSIUM: 3.9 mmol/L (ref 3.5–5.1)
Sodium: 140 mmol/L (ref 135–145)

## 2014-08-30 LAB — CREATININE, SERUM
Creatinine, Ser: 1.66 mg/dL — ABNORMAL HIGH (ref 0.61–1.24)
GFR calc Af Amer: 42 mL/min — ABNORMAL LOW (ref >60)
GFR calc non Af Amer: 37 mL/min — ABNORMAL LOW (ref >60)

## 2014-08-30 MED ORDER — LIDOCAINE HCL 2 % EX GEL
CUTANEOUS | Status: DC | PRN
  Filled 2014-08-30: qty 5

## 2014-08-30 NOTE — Progress Notes (Signed)
Recreational Therapy Session Note  Patient Details  Name: Jeffery Campbell MRN: [id number] Date of Birth: 06-06-1928 Today's Date: 08/30/2014  Pain: no c/o Skilled Therapeutic Interventions/Progress Updates: Pt participated in community reintegration/outing to lunch at overall supervision ambulatory level using RW.  Goals focused on safe functional mobility on various community surfaces, safety awareness, identification and negotiation of obstacles, accessing public restrooms, & identifying energy conservation techniques.  See outing goal sheet in shadow chart for full details.  Therapy/Group: [employer]integration   Jeffery Campbell 08/30/2014, 3:03 PM

## 2014-08-30 NOTE — Progress Notes (Addendum)
[facility] PHYSICAL MEDICINE & REHABILITATION     PROGRESS NOTE    Subjective/Complaints:  Showered this am Last BM yesterday  Pt denies nausea, vomiting, abdominal pain, diarrhea, chest pain, shortness of breath, palpitations, dizziness   Objective: Vital Signs: Blood pressure 136/67, pulse 74, temperature 97.9 F (36.6 C), temperature source Oral, resp. rate 18, height 6\' 1"  (1.854 m), weight 111.3 kg (245 lb 6 oz), SpO2 97 %. No results found. No results for input(s): WBC, HGB, HCT, PLT in the last 72 hours. No results for input(s): NA, K, CL, GLUCOSE, BUN, CREATININE, CALCIUM in the last 72 hours.  Invalid input(s): CO CBG (last 3)  No results for input(s): GLUCAP in the last 72 hours.  Wt Readings from Last 3 Encounters:  08/23/14 111.3 kg (245 lb 6 oz)  05/24/14 115.327 kg (254 lb 4 oz)  04/05/14 115.781 kg (255 lb 4 oz)    Physical Exam:  Constitutional: He is oriented to person, place, and time. He appears well-developed and well-nourished.  HENT: oral mucosa appears dry Head: Normocephalic and atraumatic.  Eyes: Conjunctivae are normal. Pupils are equal, round, and reactive to light.  Neck: Normal range of motion. Neck supple.  Cardiovascular: Normal rate and regular rhythm. no murmur Respiratory: Effort normal and breath sounds normal. No respiratory distress. Minimal wheezing.  GI: Soft. Bowel sounds are normal. He exhibits no distension. There is no tenderness.  Musculoskeletal: He exhibits no edema or tenderness.  Neurological: He is alert and oriented to person, place, and time. Normal insight and awareness. He has normal reflexes.  Left central 7 and tongue deviation. Speech slightly slurred but intelligible.  Left upper and left lower limb ataxia improving. Motor: 4+/5 left deltoid, bicep, tricep, hi, hip, knee, ankle.  Skin: Skin is warm and dry.  Psychiatric: He has a normal mood and affect. His behavior is normal. Judgment and thought content  normal.   Assessment/Plan: 1. Functional deficits secondary to right thalamic/PLIC infarct which require 3+ hours per day of interdisciplinary therapy in a comprehensive inpatient rehab setting. Physiatrist is providing close team supervision and 24 hour management of active medical problems listed below. Physiatrist and rehab team continue to assess barriers to discharge/monitor patient progress toward functional and medical goals. FIM: FIM - Bathing Bathing Steps Patient Completed: Chest, Right Arm, Left Arm, Abdomen, Front perineal area, Right upper leg, Buttocks, Left upper leg, Right lower leg (including foot), Left lower leg (including foot) Bathing: 5: Supervision: Safety issues/verbal cues  FIM - Upper Body Dressing/Undressing Upper body dressing/undressing steps patient completed: Thread/unthread right sleeve of pullover shirt/dresss, Thread/unthread left sleeve of pullover shirt/dress, Put head through opening of pull over shirt/dress, Pull shirt over trunk Upper body dressing/undressing: 5: Supervision: Safety issues/verbal cues FIM - Lower Body Dressing/Undressing Lower body dressing/undressing steps patient completed: Thread/unthread right underwear leg, Thread/unthread left underwear leg, Pull underwear up/down, Thread/unthread right pants leg, Thread/unthread left pants leg, Pull pants up/down, Don/Doff right sock, Don/Doff left sock, Don/Doff right shoe, Don/Doff left shoe, Fasten/unfasten right shoe, Fasten/unfasten left shoe Lower body dressing/undressing: 5: Supervision: Safety issues/verbal cues  FIM - Toileting Toileting steps completed by patient: Adjust clothing prior to toileting, Performs perineal hygiene, Adjust clothing after toileting Toileting: 4: Steadying assist  FIM - Diplomatic Services operational officerToilet Transfers Toilet Transfers Assistive Devices: Bedside commode, Environmental consultantWalker, Grab bars Toilet Transfers: 4-To toilet/BSC: Min A (steadying Pt. > 75%), 4-From toilet/BSC: Min A (steadying Pt. >  75%)  FIM - Bed/Chair Transfer Bed/Chair Transfer Assistive Devices: Manufacturing systems engineerWalker Bed/Chair Transfer: 5:  Supine > Sit: Supervision (verbal cues/safety issues), 5: Bed > Chair or W/C: Supervision (verbal cues/safety issues)  FIM - Locomotion: Wheelchair Distance: 130 ft Locomotion: Wheelchair: 0: Activity did not occur FIM - Locomotion: Ambulation Locomotion: Ambulation Assistive Devices: Designer, industrial/product, Emergency planning/management officer Ambulation/Gait Assistance: 5: Supervision, 4: Min guard (220' S w/RW; Minguard w/cane) Locomotion: Ambulation: 4: Travels 150 ft or more with minimal assistance (Pt.>75%)  Comprehension Comprehension Mode: Auditory Comprehension: 6-Follows complex conversation/direction: With extra time/assistive device  Expression Expression Mode: Verbal Expression: 6-Expresses complex ideas: With extra time/assistive device  Social Interaction Social Interaction: 7-Interacts appropriately with others - No medications needed.  Problem Solving Problem Solving: 6-Solves complex problems: With extra time  Memory Memory: 6-More than reasonable amt of time  Medical Problem List and Plan: 1. Functional deficits secondary to right thalamic/PLIC infarct 2. DVT Prophylaxis/Anticoagulation: Pharmaceutical: Lovenox 3. Pain Management: Off meloxicam. Tylenol prn for OA pain. Continue Flexeril at bedtime.  4. Mood: Continue Celexa daily with valium prn. Remains quite otivated and no signs of distress noted.  5. Neuropsych: This patient is capable of making decisions on his own behalf. 6. Skin/Wound Care: Routine pressure relief measures.  7. Fluids/Electrolytes/Nutrition: encourage adequate PO intake  -keep an eye on Cr----recheck next week 8. HTN: Monitor every 8 hours. Continue Cardizem and Hytrin. Avoid hypotension.  9. GERD: symptoms stable on protonix.    LOS (Days) 7 A FACE TO FACE EVALUATION WAS PERFORMED  Erick Colace 08/30/2014 7:29 AM

## 2014-08-30 NOTE — Progress Notes (Signed)
Occupational Therapy Note  Patient Details  Name: Jeffery Campbell MRN: [id number] Date of Birth: Apr 06, 1928  Today's Date: 08/30/2014 OT Concurrent Time: 1100-1300 OT Concurrent Time Calculation (min): 120 min  Pt denied pain  Pt participated in community reintegration including outing to lunch at [employer] (cotreatment with Architectecreational Therapist).  Pt required to climb steps into/out of [place] [place] and navigate parking lot of [employer].  Pt required to navigate cluttered environment in restaurant.  Pt located public restroom but did not have to access.  Pt navigated stepping up/down curb to access sidewalk to restaurant.  Pt required supervision throughout outing.     Lavone NeriLanier, Jeffery Campbell [facility] HospitalChappell 08/30/2014, 1:17 PM

## 2014-08-30 NOTE — Progress Notes (Signed)
Physical Therapy Session Note  Patient Details  Name: Jeffery Campbell MRN: [id number] Date of Birth: December 18, 1928  Today's Date: 08/30/2014 PT Individual Time: 4098-1191 PT Individual Time Calculation (min): 63 min   Short Term Goals: Week 1:  PT Short Term Goal 1 (Week 1): = LTGs due to anticipated LOS   Skilled Therapeutic Interventions/Progress Updates:     Five times Sit to Stand Test (FTSS) Method: Use a straight back chair with a solid seat that is 16-18" high. Ask participant to sit on the chair with arms folded across their chest.  Instructions: "Stand up and sit down as quickly as possible 5 times, keeping your arms folded across your chest."  Measurement: Stop timing when the participant stands the 5th time.  TIME: ___16.87___ (in seconds)  Times > 13.6 seconds is associated with increased disability and morbidity (Guralnik, 2000) Times > 15 seconds is predictive of recurrent falls in healthy individuals aged 77 and older (Buatois, et al., 2008) Normal performance values in community dwelling individuals aged 23 and older (Bohannon, 2006):  60-69 years: 11.4 seconds  70-79 years: 12.6 seconds  80-89 years: 14.8 seconds  MCID: ? 2.3 seconds for Vestibular Disorders (Meretta, 2006)  10 MWT with RW 1.66 ft/sec. Patient ambulated with RW distant supervision (as well as for transfers) x 160', occasional scuffs left LE on floor with self correction.  Negotiated 12 steps two rails with self selected technique supervision.  Supine<> sit on mat mod Independent.  Patient participated in balance measures as noted above and below.  Patient reports son has treadmill at home.  So practiced stepping up on treadmill and gait x 2 min on treadmill min guard with bilateral UE support at 0.8 mph with cues for posture, step length and heel strike.  Returned to room in recliner with all needs in reach.  Therapy Documentation Precautions:  Precautions Precautions: Fall Precaution Comments:  left inattention Restrictions Weight Bearing Restrictions: No    Vital Signs: Prior to exercise HR 77, SpO2 04% BP 134/69 After 2 minutes on treadmill at 0.8 mph HR 87, SpO2 96% BP 135/82 RPE= 4/10 Pain: Pain Assessment Pain Assessment: No/denies pain     Balance: Balance Balance Assessed: Yes Standardized Balance Assessment Standardized Balance Assessment: Berg Balance Test Berg Balance Test Sit to Stand: Able to stand without using hands and stabilize independently Standing Unsupported: Able to stand safely 2 minutes Sitting with Back Unsupported but Feet Supported on Floor or Stool: Able to sit safely and securely 2 minutes Stand to Sit: Controls descent by using hands Transfers: Able to transfer safely, definite need of hands Standing Unsupported with Eyes Closed: Able to stand 10 seconds safely Standing Ubsupported with Feet Together: Able to place feet together independently and stand 1 minute safely From Standing, Reach Forward with Outstretched Arm: Can reach confidently >25 cm (10") From Standing Position, Pick up Object from Floor: Able to pick up shoe safely and easily From Standing Position, Turn to Look Behind Over each Shoulder: Looks behind from both sides and weight shifts well Turn 360 Degrees: Needs close supervision or verbal cueing Standing Unsupported, Alternately Place Feet on Step/Stool: Able to complete >2 steps/needs minimal assist Standing Unsupported, One Foot in Front: Able to plae foot ahead of the other independently and hold 30 seconds Standing on One Leg: Tries to lift leg/unable to hold 3 seconds but remains standing independently Total Score: 44 Timed Up and Go Test TUG: Normal TUG Normal TUG (seconds): 22.35 (with RW)  See FIM for  current functional status  Therapy/Group: Individual Therapy  Rudi CocoWYNN,CYNDI  [name], South CarolinaPT [phone] 08/30/2014  08/30/2014, 12:19 PM

## 2014-08-30 NOTE — Progress Notes (Signed)
Occupational Therapy Session Note  Patient Details  Name: Merrily BrittleJoe Heffron MRN: [id number] Date of Birth: [birth year]  Today's Date: 08/30/2014 OT Individual Time: 0700-0800 OT Individual Time Calculation (min): 60 min    Short Term Goals: Week 1:  OT Short Term Goal 1 (Week 1): STG=LTG d/t short LOS  Skilled Therapeutic Interventions/Progress Updates:    Pt initially engaged in BADL retraining including bathing at shower level and dressing with sit<>stand from EOB.  Pt completed grooming tasks while standing at sink.  Pt completed all tasks at supervision level.  Pt transitioned to therapy gym and engaged in handwriting tasks with LUE.  Improvement noted with practice.  Pt also engaged in [facility] tasks with focus on manipulation of smaller objects and open chain tasks placing objects in container.  Focu son activity tolerance, sit<>stand, standing balance, functional transfers, functional amb with RW, FMC, and safety awareness.  Therapy Documentation Precautions:  Precautions Precautions: Fall Precaution Comments: left inattention Restrictions Weight Bearing Restrictions: No Pain:  Pt denied pain ADL: ADL ADL Comments: se  FIM  See FIM for current functional status  Therapy/Group: Individual Therapy  [name], Jimmi Sidener Chappell 08/30/2014, 8:02 AM

## 2014-08-31 ENCOUNTER — Inpatient Hospital Stay (HOSPITAL_COMMUNITY): Payer: Commercial Managed Care - HMO

## 2014-08-31 DIAGNOSIS — I63011 Cerebral infarction due to thrombosis of right vertebral artery: Secondary | ICD-10-CM

## 2014-08-31 NOTE — Progress Notes (Signed)
Occupational Therapy Note  Patient Details  Name: Merrily BrittleJoe Dondero MRN: [id number] Date of Birth: 08-29-1928  Today's Date: 08/31/2014 OT Individual Time: 1300-1400 OT Individual Time Calculation (min): 60 min   Pt denied pain Individual Therapy  Pt amb with RW to therapy gym and engaged in dynamic standing tasks, functional amb for home mgmt, and dynamics sitting activities.  Pt required multiple rest breaks throughout activities.  Pt transitioned to ADL apartment and engaged in home mgmt tasks including making a bed, gathering clothing, and folding towels.  Pt completed all tasks at mod I level with appropriate rest breaks. Focus on dynamic standing balance, safety awareness, and discharge planning.   Lavone NeriLanier, Ileah Falkenstein [facility] ClinicChappell 08/31/2014, 2:11 PM

## 2014-08-31 NOTE — Progress Notes (Signed)
[place] PHYSICAL MEDICINE & REHABILITATION     PROGRESS NOTE    Subjective/Complaints:  No pain c/os.  Starting ADLs with OT.    Pt denies nausea, vomiting, abdominal pain, diarrhea, chest pain, shortness of breath, palpitations, dizziness   Objective: Vital Signs: Blood pressure 155/71, pulse 69, temperature 97.6 F (36.4 C), temperature source Oral, resp. rate 18, height 6\' 1"  (1.854 m), weight 111.3 kg (245 lb 6 oz), SpO2 91 %. No results found. No results for input(s): WBC, HGB, HCT, PLT in the last 72 hours.  Recent Labs  08/30/14 0834  NA 140  K 3.9  CL 104  GLUCOSE 134*  BUN 23*  CREATININE 1.55*  1.66*  CALCIUM 9.3   CBG (last 3)  No results for input(s): GLUCAP in the last 72 hours.  Wt Readings from Last 3 Encounters:  08/23/14 111.3 kg (245 lb 6 oz)  05/24/14 115.327 kg (254 lb 4 oz)  04/05/14 115.781 kg (255 lb 4 oz)    Physical Exam:  Constitutional: He is oriented to person, place, and time. He appears well-developed and well-nourished.  HENT: oral mucosa appears dry Head: Normocephalic and atraumatic.  Eyes: Conjunctivae are normal. Pupils are equal, round, and reactive to light.  Neck: Normal range of motion. Neck supple.  Cardiovascular: Normal rate and regular rhythm. no murmur Respiratory: Effort normal and breath sounds normal. No respiratory distress. Minimal wheezing.  GI: Soft. Bowel sounds are normal. He exhibits no distension. There is no tenderness.  Musculoskeletal: He exhibits no edema or tenderness.  Neurological: He is alert and oriented to person, place, and time. Normal insight and awareness. He has normal reflexes.  Left central 7 and tongue deviation. Speech slightly slurred but intelligible.  Left upper and left lower limb ataxia improving. Motor: 4+/5 left deltoid, bicep, tricep, hi, hip, knee, ankle.  Skin: Skin is warm and dry.  Psychiatric: He has a normal mood and affect. His behavior is normal. Judgment and  thought content normal.   Assessment/Plan: 1. Functional deficits secondary to right thalamic/PLIC infarct which require 3+ hours per day of interdisciplinary therapy in a comprehensive inpatient rehab setting. Physiatrist is providing close team supervision and 24 hour management of active medical problems listed below. Physiatrist and rehab team continue to assess barriers to discharge/monitor patient progress toward functional and medical goals. FIM: FIM - Bathing Bathing Steps Patient Completed: Chest, Right Arm, Left Arm, Abdomen, Front perineal area, Right upper leg, Buttocks, Left upper leg, Right lower leg (including foot), Left lower leg (including foot) Bathing: 5: Supervision: Safety issues/verbal cues  FIM - Upper Body Dressing/Undressing Upper body dressing/undressing steps patient completed: Thread/unthread right sleeve of pullover shirt/dresss, Thread/unthread left sleeve of pullover shirt/dress, Put head through opening of pull over shirt/dress, Pull shirt over trunk Upper body dressing/undressing: 5: Supervision: Safety issues/verbal cues FIM - Lower Body Dressing/Undressing Lower body dressing/undressing steps patient completed: Thread/unthread right underwear leg, Thread/unthread left underwear leg, Pull underwear up/down, Thread/unthread right pants leg, Thread/unthread left pants leg, Pull pants up/down, Don/Doff right sock, Don/Doff left sock, Don/Doff right shoe, Don/Doff left shoe, Fasten/unfasten right shoe, Fasten/unfasten left shoe Lower body dressing/undressing: 5: Supervision: Safety issues/verbal cues  FIM - Toileting Toileting steps completed by patient: Adjust clothing prior to toileting, Performs perineal hygiene, Adjust clothing after toileting Toileting: 5: Supervision: Safety issues/verbal cues  FIM - Diplomatic Services operational officerToilet Transfers Toilet Transfers Assistive Devices: Bedside commode, Environmental consultantWalker, Grab bars Toilet Transfers: 5-To toilet/BSC: Supervision (verbal cues/safety  issues), 5-From toilet/BSC: Supervision (verbal cues/safety issues)  FIM - Banker Devices: Therapist, occupational: 6: More than reasonable amt of time, 6: Supine > Sit: No assist, 6: Sit > Supine: No assist, 5: Bed > Chair or W/C: Supervision (verbal cues/safety issues), 5: Chair or W/C > Bed: Supervision (verbal cues/safety issues)  FIM - Locomotion: Wheelchair Distance: 130 ft Locomotion: Wheelchair: 0: Activity did not occur FIM - Locomotion: Ambulation Locomotion: Ambulation Assistive Devices: Designer, industrial/product Ambulation/Gait Assistance: 5: Supervision, 4: Min guard (160) Locomotion: Ambulation: 5: Travels 150 ft or more with supervision/safety issues  Comprehension Comprehension Mode: Auditory Comprehension: 6-Follows complex conversation/direction: With extra time/assistive device  Expression Expression Mode: Verbal Expression: 6-Expresses complex ideas: With extra time/assistive device  Social Interaction Social Interaction: 7-Interacts appropriately with others - No medications needed.  Problem Solving Problem Solving: 6-Solves complex problems: With extra time  Memory Memory: 6-More than reasonable amt of time  Medical Problem List and Plan: 1. Functional deficits secondary to right thalamic/PLIC infarct 2. DVT Prophylaxis/Anticoagulation: Pharmaceutical: Lovenox 3. Pain Management: Off meloxicam. Tylenol prn for OA pain. Continue Flexeril at bedtime.  4. Mood: Continue Celexa daily with valium prn. Remains quite otivated and no signs of distress noted.  5. Neuropsych: This patient is capable of making decisions on his own behalf. 6. Skin/Wound Care: Routine pressure relief measures.  7. Fluids/Electrolytes/Nutrition: encourage adequate PO intake  -keep an eye on Cr----recheck next week 8. HTN: Monitor every 8 hours. Continue Cardizem and Hytrin. Avoid hypotension.  9. GERD: symptoms stable on protonix.    LOS  (Days) 8 A FACE TO FACE EVALUATION WAS PERFORMED  Erick Colace 08/31/2014 7:12 AM

## 2014-08-31 NOTE — Progress Notes (Signed)
Occupational Therapy Discharge Summary  Patient Details  Name: Bexton Haak MRN: [id number] Date of Birth: 29-Aug-1928    Patient has met 10 of 10 long term goals due to improved activity tolerance, improved balance, postural control, ability to compensate for deficits, functional use of  LEFT upper extremity, improved awareness and improved coordination.  Pt made steady progress with BADLs during this admission.  Pt continues to exhibit mild LUE ataxia but uses LUE at diminished level during all functional tasks.  Pt does not exhibit any unsafe behaviors and is mod I for all BADLs except supervision for tub bench transfers. Patient to discharge at overall Modified Independent level.  Patient's care partner is independent to provide the necessary physical and cognitive assistance at discharge.     Recommendation:  Patient will benefit from ongoing skilled OT services in home health setting to continue to advance functional skills in the area of BADLs, balance, safety, functional use of LUE, coordination, and minimize fall risk.  Equipment: Tub Producer, television/film/video, BSC  Reasons for discharge: treatment goals met and discharge from hospital  Patient/family agrees with progress made and goals achieved: Yes  OT Discharge   ADL ADL ADL Comments: se  FIM Vision/Perception  Vision- History Baseline Vision/History: Wears glasses Wears Glasses: At all times Patient Visual Report: No change from baseline Vision- Assessment Vision Assessment?: No apparent visual deficits Perception Comments: mild left inattention improved  Cognition Overall Cognitive Status: Within Functional Limits for tasks assessed Arousal/Alertness: Awake/alert Orientation Level: Oriented X4 Attention: Selective Selective Attention: Appears intact Memory: Appears intact Awareness: Appears intact Problem Solving: Appears intact Problem Solving Impairment: Verbal basic;Functional basic Executive Function: Self  Correcting Self Correcting: Impaired Self Correcting Impairment: Functional basic Safety/Judgment: Appears intact Sensation Sensation Light Touch: Appears Intact Stereognosis: Appears Intact Hot/Cold: Appears Intact Proprioception: Appears Intact Coordination Gross Motor Movements are Fluid and Coordinated: No Fine Motor Movements are Fluid and Coordinated: No Coordination and Movement Description: ataxic LLE & LUE Finger Nose Finger Test: Impaired, overshooting with left LUE although improved during admission Motor  Motor Motor: Hemiplegia;Ataxia Motor - Skilled Clinical Observations: Mild L hemiplegia and ataxic LLE & LUE Mobility  Bed Mobility Rolling Right: 6: Modified independent (Device/Increase time) Supine to Sit: 6: Modified independent (Device/Increase time) Transfers Sit to Stand: 6: Modified independent (Device/Increase time) Stand to Sit: 6: Modified independent (Device/Increase time)  Trunk/Postural Assessment  Cervical Assessment Cervical Assessment: Within Functional Limits Thoracic Assessment Thoracic Assessment: Within Functional Limits Lumbar Assessment Lumbar Assessment: Within Functional Limits Postural Control Postural Control: Within Functional Limits  Extremity/Trunk Assessment RUE Assessment RUE Assessment: Within Functional Limits LUE Assessment LUE Assessment: Exceptions to [facility] (mild ataxia)  See FIM for current functional status  Leroy Libman 08/31/2014, 2:29 PM

## 2014-08-31 NOTE — Progress Notes (Signed)
Occupational Therapy Session Note  Patient Details  Name: Jeffery Campbell MRN: [id number] Date of Birth: 01-16-1929  Today's Date: 08/31/2014 OT Individual Time: 0700-0800 OT Individual Time Calculation (min): 60 min    Short Term Goals: Week 1:  OT Short Term Goal 1 (Week 1): STG=LTG d/t short LOS  Skilled Therapeutic Interventions/Progress Updates:    Pt engaged in BADL retraining including bathing at shower level and dressing with sti<>stand from EOB.  Pt gathered clothing and supplies prior to tasks.  Pt completed all tasks at mod I level.  Pt transitioned to ADL apartment and practiced tub bench transfers in ADL bathroom.  Pt required more than reasonable amount of time to complete all tasks.  Continued discharge planning and recommendations.  Pt to discharge tomorrow.  Therapy Documentation Precautions:  Precautions Precautions: Fall Precaution Comments: left inattention Restrictions Weight Bearing Restrictions: No Pain: Pain Assessment Pain Assessment: No/denies pain ADL: ADL ADL Comments: se  FIM  See FIM for current functional status  Therapy/Group: Individual Therapy  [name], Dwan Fennel Chappell 08/31/2014, 8:00 AM

## 2014-08-31 NOTE — Discharge Summary (Signed)
Physician Discharge Summary  Patient ID: Jeffery Campbell MRN: [id number] DOB/AGE: 04-Apr-1928 81 y.o.  Admit date: 08/23/2014 Discharge date: 08/31/2014  Discharge Diagnoses:  Principal Problem:   Thalamic infarct, acute Active Problems:   Cerebral infarction due to thrombosis of right carotid artery   Left hemiparesis   Discharged Condition: stable   Significant Diagnostic Studies: Dg Chest 2 View  08/23/2014   CLINICAL DATA:  Wheezing for 1 month. Recent stroke with left-sided weakness. History of hypertension. Initial encounter.  EXAM: CHEST  2 VIEW  COMPARISON:  08/19/2014.  FINDINGS: The heart size and mediastinal contours are stable. Mild pulmonary interstitial prominence appears unchanged. The overall pulmonary aeration is improved. There is no edema, confluent airspace opacity or significant pleural effusion. There are stable mild degenerative changes of the thoracolumbar spine.  IMPRESSION: No acute cardiopulmonary process.   Electronically Signed   By: Carey Bullocks M.D.   On: 08/23/2014 18:30     Labs:  Basic Metabolic Panel: BMP Latest Ref Rng 08/30/2014 08/30/2014 08/24/2014  Glucose 65 - 99 mg/dL 409(W) - 119(J)  BUN 6 - 20 mg/dL 47(W) - 19  Creatinine 0.61 - 1.24 mg/dL 2.95(A) 2.13(Y) 8.65(H)  Sodium 135 - 145 mmol/L 140 - 138  Potassium 3.5 - 5.1 mmol/L 3.9 - 3.9  Chloride 101 - 111 mmol/L 104 - 105  CO2 22 - 32 mmol/L 25 - 25  Calcium 8.9 - 10.3 mg/dL 9.3 - 9.1     CBC: CBC Latest Ref Rng 08/24/2014 08/23/2014 08/19/2014  WBC 4.0 - 10.5 K/uL 7.9 7.8 9.1  Hemoglobin 13.0 - 17.0 g/dL 17.3(H) 16.8 16.8  Hematocrit 39.0 - 52.0 % 48.9 47.3 48.1  Platelets 150 - 400 K/uL 181 177 180      Brief HPI:   Jeffery Campbell is a 81 y.o. male with history of HTN, OA, bradycardia, anxiety d/o; who was admitted on 08/19/14 with left sided weakness and fall with inability to walk. CT head without acute changes. MRI/MRA brain done revealing acute ischemia right lateral thalamus v/s  posterior limb of internal capsule. Carotid dopplers without significant ICA stenosis. 2D echo with EF 55-62%, no wall abnormality and grade I diastolic dysfunction. Dr. Roda Shutters evaluated patient and recommends changing chronic ASA to plavix for stroke likely due to SVD. EKG with frequent PVCs but negative for A fib and outpatient cardiac monitoring arranged with cardiology. Carotid dopplers without significant ICA stenosis. Patient with resultant left sided weakness and mild flaccid dysarthria. CIR recommended by MD and rehab team and patient admitted today.   Hospital Course: Braydin Aloi was admitted to rehab 08/23/2014 for inpatient therapies to consist of PT and OT at least three hours five days a week. Past admission physiatrist, therapy team and rehab RN have worked together to provide customized collaborative inpatient rehab. Blood pressures have been monitored on tid basis and have been reasonably controlled on Cardizem and Hytrin.  Renal status has been monitored closely due to rise in creatinine and patient was advised to push po fluids due to signs of dehydration.  He is to have lytes recheck in next week by [facility] with results to PMD.  Heart rate has been controlled without tachycardia. Pain due to OA bilateral knees has been managed by tylenol. He has made good progress during his rehab stay and is modified independent at discharge. He will continue to receive follow up HHPT and HHOT by [facility] past discharge.    Rehab course: During patient's stay in rehab weekly team conferences  were held to monitor patient's progress, set goals and discuss barriers to discharge. At admission, patient required min to moderate assistance with mobility and min assist with self care tasks. Patient has had improvement in activity tolerance, balance, decrease in ataxia as well as improvement in coordination. He is able to compensate for deficits and has had improvement in functional use LUE  and LLE control.  He  is able to complete ADL tasks as well as simple home management task independently with multiple rest breaks. He is independent for transfer and ambulation. He is able to ambulate 160' with RW at modified independent levels.    Disposition: Home   Diet:  Heart Healthy.  Special Instructions: 1. Needs supervision when out of home setting. 2. Drink at least 5 cups of water daily.      Medication List    ASK your doctor about these medications        atorvastatin 20 MG tablet  Commonly known as:  LIPITOR  Take 20 mg by mouth daily.     citalopram 10 MG tablet  Commonly known as:  CELEXA  Take 10 mg by mouth daily.     clopidogrel 75 MG tablet  Commonly known as:  PLAVIX  Take 1 tablet (75 mg total) by mouth daily.     cyclobenzaprine 5 MG tablet  Commonly known as:  FLEXERIL  Take 5 mg by mouth at bedtime.     diazepam 5 MG tablet  Commonly known as:  VALIUM  Take 5 mg by mouth every 8 (eight) hours as needed for anxiety.     diltiazem 120 MG 24 hr capsule  Commonly known as:  CARDIZEM CD  Take 1 capsule (120 mg total) by mouth daily.     lansoprazole 30 MG capsule  Commonly known as:  PREVACID  Take 30 mg by mouth daily at 12 noon.     Omega 3 1200 MG Caps  Take by mouth daily.     terazosin 5 MG capsule  Commonly known as:  HYTRIN  Take 5 mg by mouth at bedtime.     vitamin B-12 1000 MCG tablet  Commonly known as:  CYANOCOBALAMIN  Take 2,000 mcg by mouth daily.           Follow-up Information    Follow up with Dennison MascotLemont Morrisey, MD.   [facility]  [facility]   Contact information:   [address] [place] [address] [phone]       Follow up with Ranelle OysterSWARTZ,ZACHARY T, MD On 10/25/2014.   Specialty:  Physical Medicine and Rehabilitation   Why:  Be there at 11:30  for noon appointment   Contact information:   [address] [place] [address] [phone]       Follow up with Xu,Jindong, MD. Call today.    Specialty:  Neurology   Why:  for follow up appointment in 4 weeks. Dealer(Neurologist)   Contact information:   [address] [place] [phone]       Follow up with Thurmon FairROITORU,MIHAI, MD. Call today.   Specialty:  Cardiology   Why:  to set up for 30 day cardiac monitor placement.    Contact information:   [address] [place] [address] [phone]       Signed: Jacquelynn CreeLove, Penelope Fittro S 08/31/2014, 4:14 PM

## 2014-08-31 NOTE — Progress Notes (Signed)
Physical Therapy Session Note & Discharge Summary  Patient Details  Name: Jeffery Campbell MRN: [id number] Date of Birth: [birth year]  Today's Date: 08/31/2014 PT Individual Time: 9179-1505 69 minutes      Patient has met 10 of 10 long term goals due to improved activity tolerance, improved balance, ability to compensate for deficits and functional use of  left lower extremity.  Patient to discharge at an ambulatory level Modified Independent.   Patient's care partner is capable to provide the necessary supervision assistance at discharge.  Patient improved on Berg balance test from 31/56 initially to 44/56 on discharge, improved five times sit to stand time from 26 seconds on eval to 16.87 seconds, and improved TUG from 26 seconds on eval to 22.35 seconds.  10 meter walk test on eval was 0.25 m/s on eval and improved to 0.51 m/s with rolling walker.  Patient will benefit from continued skilled PT services in home setting progressing to outpatient PT to achieve his goals of getting off walker and independence in the community.   Reasons goals not met: N/A  Recommendation:  Patient will benefit from ongoing skilled PT services in home health setting to continue to advance safe functional mobility, address ongoing impairments in safety, left LE coordination, gait and balance, and minimize fall risk.  Equipment: Rolling walker and shower seat, 3:1  Reasons for discharge: treatment goals met  Patient/family agrees with progress made and goals achieved: Yes  PT Discharge Precautions/Restrictions Precautions Precautions: Fall Precaution Comments: left inattention Vital Signs Therapy Vitals Temp: 98.5 F (36.9 C) Temp Source: Oral Pulse Rate: 73 Resp: 18 BP: 134/68 mmHg Patient Position (if appropriate): Sitting Oxygen Therapy SpO2: 96 % O2 Device: Not Delivered Pain Pain Assessment Pain Assessment: No/denies pain  Cognition Overall Cognitive Status: Within Functional Limits for  tasks assessed Arousal/Alertness: Awake/alert Orientation Level: Oriented X4 Attention: Selective Selective Attention: Appears intact Memory: Appears intact Awareness: Appears intact Problem Solving: Appears intact Problem Solving Impairment: Verbal basic;Functional basic Executive Function: Self Correcting Self Correcting: Impaired Self Correcting Impairment: Functional basic Safety/Judgment: Appears intact Sensation Sensation Light Touch: Appears Intact Stereognosis: Appears Intact Hot/Cold: Appears Intact Proprioception: Appears Intact Coordination Gross Motor Movements are Fluid and Coordinated: No Fine Motor Movements are Fluid and Coordinated: No Coordination and Movement Description: ataxic LLE & LUE Finger Nose Finger Test: Impaired, overshooting with left LUE although improved during admission Motor  Motor Motor: Hemiplegia;Ataxia Motor - Skilled Clinical Observations: Mild L hemiplegia and ataxic LLE & LUE  Mobility Bed Mobility Rolling Right: 6: Modified independent (Device/Increase time) Supine to Sit: 6: Modified independent (Device/Increase time) Transfers Sit to Stand: 6: Modified independent (Device/Increase time) Stand to Sit: 6: Modified independent (Device/Increase time) Locomotion  Ambulation Ambulation: Yes Ambulation/Gait Assistance: 6: Modified independent (Device/Increase time) Ambulation Distance (Feet): 200 Feet Assistive device: Rolling walker Gait Gait Pattern: Left steppage;Step-through pattern;Decreased stride length Gait velocity: 10 MWT w/ RW mod I = 1.26 ft/sec Stairs / Additional Locomotion Stairs: Yes Stairs Assistance: 5: Supervision Stairs Assistance Details: Verbal cues for technique Stairs Assistance Details (indicate cue type and reason): supervision for safety Stair Management Technique: One rail Right;Two rails;Alternating pattern;Step to pattern;Forwards Number of Stairs: 12 Height of Stairs: 6 Ramp: 5: Supervision Curb:  6: Modified independent (Device/increase time) Wheelchair Mobility Wheelchair Mobility: No  Trunk/Postural Assessment  Cervical Assessment Cervical Assessment: Within Functional Limits Thoracic Assessment Thoracic Assessment: Within Functional Limits Lumbar Assessment Lumbar Assessment: Within Functional Limits Postural Control Postural Control: Within Functional Limits  Balance Static Standing Balance Static  Standing - Balance Support: No upper extremity supported Static Standing - Level of Assistance: 7: Independent Dynamic Standing Balance Dynamic Standing - Balance Support: Bilateral upper extremity supported Dynamic Standing - Level of Assistance: 6: Modified independent (Device/Increase time) Dynamic Standing - Balance Activities: Reaching for objects Extremity Assessment  RUE Assessment RUE Assessment: Within Functional Limits LUE Assessment LUE Assessment: Exceptions to WFL (mild ataxia)   Skilled Therapeutic Interventions/Progress Updates: Patient participated in standing balance activity on incline ramp with hand held assist side stepping, forward and back stepping and alternate taps over line.  Patient negotiated curb step with walker mod I after demo.  Discussed home safety and issued fall prevention handout to patient.  On treadmill 2 bouts of 2.5 minutes at 1.0 mph bilat UE support and min assist for safety.  Discussed not using his grandson's treadmill unless cleared by HHPT.  Patient maneuvered around home environment with walker safely obtaining items from cabinets in kitchen and multiple transfers from furniture mod I.  Patient back in room and made mod I in room at end of session nursing aware.    See FIM for current functional status  WYNN,CYNDI 08/31/2014, 4:48 PM Cyndi Wynn, PT [phone] 08/31/2014   

## 2014-09-01 DIAGNOSIS — I63031 Cerebral infarction due to thrombosis of right carotid artery: Secondary | ICD-10-CM

## 2014-09-01 MED ORDER — DOCUSATE SODIUM 100 MG PO CAPS: 100.0000 mg | ORAL_CAPSULE | Freq: Two times a day (BID) | ORAL | Status: DC

## 2014-09-01 MED ORDER — DILTIAZEM HCL ER COATED BEADS 120 MG PO CP24: 120.0000 mg | ORAL_CAPSULE | Freq: Every day | ORAL | Status: DC

## 2014-09-01 MED ORDER — CITALOPRAM HYDROBROMIDE 10 MG PO TABS: 10.0000 mg | ORAL_TABLET | Freq: Every day | ORAL | Status: DC

## 2014-09-01 MED ORDER — CLOPIDOGREL BISULFATE 75 MG PO TABS: 75.0000 mg | ORAL_TABLET | Freq: Every day | ORAL | Status: DC

## 2014-09-01 MED ORDER — ATORVASTATIN CALCIUM 20 MG PO TABS: 20.0000 mg | ORAL_TABLET | Freq: Every day | ORAL | Status: DC

## 2014-09-01 NOTE — Progress Notes (Signed)
[facility] PHYSICAL MEDICINE & REHABILITATION     PROGRESS NOTE    Subjective/Complaints:  Ready to go home. Feels that he's ready. Asked questions about when he might advance to a cane.   Pt denies nausea, vomiting, abdominal pain, diarrhea, chest pain, shortness of breath, palpitations, dizziness   Objective: Vital Signs: Blood pressure 156/79, pulse 70, temperature 98.3 F (36.8 C), temperature source Oral, resp. rate 19, height 6\' 1"  (1.854 m), weight 111.3 kg (245 lb 6 oz), SpO2 90 %. No results found. No results for input(s): WBC, HGB, HCT, PLT in the last 72 hours.  Recent Labs  08/30/14 0834  NA 140  K 3.9  CL 104  GLUCOSE 134*  BUN 23*  CREATININE 1.55*  1.66*  CALCIUM 9.3   CBG (last 3)  No results for input(s): GLUCAP in the last 72 hours.  Wt Readings from Last 3 Encounters:  08/23/14 111.3 kg (245 lb 6 oz)  05/24/14 115.327 kg (254 lb 4 oz)  04/05/14 115.781 kg (255 lb 4 oz)    Physical Exam:  Constitutional: He is oriented to person, place, and time. He appears well-developed and well-nourished.  HENT: oral mucosa appears dry Head: Normocephalic and atraumatic.  Eyes: Conjunctivae are normal. Pupils are equal, round, and reactive to light.  Neck: Normal range of motion. Neck supple.  Cardiovascular: Normal rate and regular rhythm. no murmur Respiratory: Effort normal and breath sounds normal. No respiratory distress. Minimal wheezing.  GI: Soft. Bowel sounds are normal. He exhibits no distension. There is no tenderness.  Musculoskeletal: He exhibits no edema or tenderness.  Neurological: He is alert and oriented to person, place, and time. Normal insight and awareness. He has normal reflexes.  Left central 7 and tongue deviation. Speech slightly slurred but intelligible.  Left upper and left lower limb ataxia improving. Motor: 4+/5 left deltoid, bicep, tricep, hi, hip, knee, ankle.  Skin: Skin is warm and dry.  Psychiatric: He has a normal  mood and affect. His behavior is normal. Judgment and thought content normal.   Assessment/Plan: 1. Functional deficits secondary to right thalamic/PLIC infarct which require 3+ hours per day of interdisciplinary therapy in a comprehensive inpatient rehab setting. Physiatrist is providing close team supervision and 24 hour management of active medical problems listed below. Physiatrist and rehab team continue to assess barriers to discharge/monitor patient progress toward functional and medical goals. FIM: FIM - Bathing Bathing Steps Patient Completed: Chest, Right Arm, Left Arm, Abdomen, Front perineal area, Right upper leg, Buttocks, Left upper leg, Right lower leg (including foot), Left lower leg (including foot) Bathing: 6: Assistive device (Comment)  FIM - Upper Body Dressing/Undressing Upper body dressing/undressing steps patient completed: Thread/unthread right sleeve of pullover shirt/dresss, Thread/unthread left sleeve of pullover shirt/dress, Put head through opening of pull over shirt/dress, Pull shirt over trunk Upper body dressing/undressing: 7: Complete Independence: No helper FIM - Lower Body Dressing/Undressing Lower body dressing/undressing steps patient completed: Thread/unthread right underwear leg, Thread/unthread left underwear leg, Pull underwear up/down, Thread/unthread right pants leg, Thread/unthread left pants leg, Pull pants up/down, Don/Doff right sock, Don/Doff left sock, Don/Doff right shoe, Don/Doff left shoe, Fasten/unfasten right shoe, Fasten/unfasten left shoe Lower body dressing/undressing: 6: More than reasonable amount of time  FIM - Toileting Toileting steps completed by patient: Adjust clothing prior to toileting, Performs perineal hygiene, Adjust clothing after toileting Toileting: 6: Assistive device: No helper  FIM - Diplomatic Services operational officerToilet Transfers Toilet Transfers Assistive Devices: Elevated toilet seat, Environmental consultantWalker, Grab bars Toilet Transfers: 6-Assistive device:  No  helper, 6-To toilet/ BSC, 6-From toilet/BSC  FIM - Bed/Chair Transfer Bed/Chair Transfer Assistive Devices: Therapist, occupational: 6: Assistive device: no helper, 7: Supine > Sit: No assist, 7: Sit > Supine: No assist, 6: Bed > Chair or W/C: No assist, 6: Chair or W/C > Bed: No assist  FIM - Locomotion: Wheelchair Distance: 130 ft Locomotion: Wheelchair: 0: Activity did not occur FIM - Locomotion: Ambulation Locomotion: Ambulation Assistive Devices: Designer, industrial/product Ambulation/Gait Assistance: 6: Modified independent (Device/Increase time) Locomotion: Ambulation: 6: Travels 150 ft or more with assistive device/no helper  Comprehension Comprehension Mode: Auditory Comprehension: 6-Follows complex conversation/direction: With extra time/assistive device  Expression Expression Mode: Verbal Expression: 6-Expresses complex ideas: With extra time/assistive device  Social Interaction Social Interaction: 7-Interacts appropriately with others - No medications needed.  Problem Solving Problem Solving: 6-Solves complex problems: With extra time  Memory Memory: 6-More than reasonable amt of time  Medical Problem List and Plan: 1. Functional deficits secondary to right thalamic/PLIC infarct 2. DVT Prophylaxis/Anticoagulation: Pharmaceutical: Lovenox 3. Pain Management: Off meloxicam. Tylenol prn for OA pain. Continue Flexeril at bedtime.  4. Mood: Continue Celexa daily with valium prn. Remains quite motivated and no signs of distress noted.  5. Neuropsych: This patient is capable of making decisions on his own behalf. 6. Skin/Wound Care: Routine pressure relief measures.  7. Fluids/Electrolytes/Nutrition: encourage adequate PO intake  -keep an eye on Cr----1.55----no nephrotoxic meds currently----needs follow up with PCP with labwork within a week  -encourage fluids 8. HTN: Monitor every 8 hours. Continue Cardizem and Hytrin. Avoid hypotension.  9. GERD: symptoms stable on  protonix.    LOS (Days) 9 A FACE TO FACE EVALUATION WAS PERFORMED  Maureen Delatte T 09/01/2014 7:22 AM

## 2014-09-01 NOTE — Progress Notes (Signed)
Pt given discharge instructions by Delle ReiningPamela Love, equpiment delivered. Pt discharge to home with grandson.

## 2014-09-01 NOTE — Patient Care Conference (Signed)
Inpatient RehabilitationTeam Conference and Plan of Care Update Date: 08/31/2014   Time: 2:45 PM    Patient Name: Jeffery BrittleJoe Morozov      Medical Record Number: [id number]  Date of Birth: Jul 19, 1928 Sex: Male         Room/Bed: 4M03C/4M03C-01 Payor Info: Payor: HU[employer] MEDICARE / Plan: [employer] GOLD PLUS HMO THN/NTSP / Product Type: *No Product type* /    Admitting Diagnosis: r thalmic CVA  Admit Date/Time:  08/23/2014  4:03 PM Admission Comments: No comment available   Primary Diagnosis:  Thalamic infarct, acute Principal Problem: Thalamic infarct, acute  Patient Active Problem List   Diagnosis Date Noted  . Thalamic infarct, acute   . Left hemiparesis   . CVA (cerebral infarction) 08/19/2014  . Stroke 08/19/2014  . Hypertension 08/19/2014  . HLD (hyperlipidemia) 08/19/2014  . CKD (chronic kidney disease) stage 3, GFR 30-59 ml/min 08/19/2014  . Accelerated hypertension   . 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 Obesity   . Asymptomatic PVCs 05/27/2014  . Bradycardia 04/05/2014  . Essential hypertension 04/05/2014    Expected Discharge Date: Expected Discharge Date: 09/01/14  Team Members Present: Physician leading conference: Dr. Faith RogueZachary Swartz Social Worker Present: Amada JupiterLucy Ivyana Locey, LCSW Nurse Present: Carmie EndAngie Joyce, RN PT Present: Bayard Huggerebecca Varner, PT OT Present: Ardis Rowanom Lanier, Darolyn RuaOTA;Kayla Perkinson, OT SLP Present: Feliberto Gottronourtney Payne, SLP PPS Coordinator present : Tora DuckMarie Noel, RN, CRRN     Current Status/Progress Goal Weekly Team Focus  [facility]   [facility]  [facility]   Bowel/Bladder   Continent of bowel and bladder. LBM 08/29/14  Mod I  Monitor   Swallow/Nutrition/ Hydration             ADL's   BADL-mod I, toilet transfers/shower transfers-supervision; home mgmt tasks-supervisoin  Overall Mod I, supervision for TTB transfer  safety education; home mgmt tasks, discharge planning   Mobility   basically at goal status with RW  mod I except  supervision for community ambulation, car transfer, and stairs  safety education, balance, left LE coordination, family ed??   Communication             Safety/Cognition/ Behavioral Observations            Pain   No c/o pain  <3  Assess for nonverbal cues of pain   Skin   CDI  No additional skin breakdown  Assess q shift. Routine turn q 2hrs    Rehab Goals Patient on target to meet rehab goals: Yes *See Care Plan and progress notes for long and short-term goals.  Barriers to Discharge: none    Possible Resolutions to Barriers:  D/C in am    Discharge Planning/Teaching Needs:  home with family able to provide intermittent assistance;  can add more help if needed (neighbor)      Team Discussion:  Ready for d/c tomorrow.  Mod i in room today.  Begin with HH f/u per pt request  Revisions to Treatment Plan:  NA   Continued Need for Acute Rehabilitation Level of Care: The patient requires daily medical management by a physician with specialized training in physical medicine and rehabilitation for the following conditions: Daily direction of a multidisciplinary physical rehabilitation program to ensure safe treatment while eliciting the highest outcome that is of practical value to the patient.: Yes Daily analysis of laboratory values and/or radiology reports with any subsequent need for medication adjustment of medical intervention for : Neurological problems;Other  Madelyn Tlatelpa,  Citlaly Camplin 09/01/2014, 1:29 PM

## 2014-09-01 NOTE — Progress Notes (Signed)
Social Work  Discharge Note  The overall goal for the admission was met for:   Discharge location: Yes - returning to his own home  Length of Stay: Yes - 9 days  Discharge activity level: Yes - modified independent  Home/community participation: Yes  Services provided included: MD, RD, PT, OT, SLP, RN, TR, Pharmacy and [place]: Medicare  Mcarthur Rossetti [facility])  Follow-up services arranged: Home Health: PT, OT via [facility], DME: rolling walker, 3n1 commode and tub bench via [facility] and Patient/Family has no preference for HH/DME agencies  Comments (or additional information):  Patient/Family verbalized understanding of follow-up arrangements: Yes  Individual responsible for coordination of the follow-up plan: patient  Confirmed correct DME delivered: Fawaz Borquez 09/01/2014    Myrta Mercer

## 2014-09-01 NOTE — Discharge Instructions (Signed)
Inpatient Rehab Discharge Instructions  Jeffery Campbell Discharge date and time:  09/01/14  Activities/Precautions/ Functional Status: Activity: activity as tolerated and no driving till cleared by MD.   Diet: low fat, low cholesterol diet Drink plenty of fluids daily. Wound Care: none needed   Functional status:  ___ No restrictions     ___ Walk up steps independently ___ 24/7 supervision/assistance   ___ Walk up steps with assistance _X__ Intermittent supervision/assistance  _X__ Bathe/dress independently ___ Walk with walker     ___ Bathe/dress with assistance ___ Walk Independently    ___ Shower independently ___ Walk with assistance    ___ Shower with assistance _X__ No alcohol     ___ Return to work/school ________    COMMUNITY REFERRALS UPON DISCHARGE:    Home Health:   PT     OT                         Agency: [facility] Phone: [phone]   Medical Equipment/Items Ordered: rolling walker, commode and tub bench                                                     Agency/Supplier: [facility] @ [phone]   Special Instructions: 1. Needs supervision when out of home setting. 2. Drink at least 5 cups of water daily.    STROKE/TIA DISCHARGE INSTRUCTIONS SMOKING Cigarette smoking nearly doubles your risk of having a stroke & is the single most alterable risk factor  If you smoke or have smoked in the last 12 months, you are advised to quit smoking for your health.  Most of the excess cardiovascular risk related to smoking disappears within a year of stopping.  Ask you doctor about anti-smoking medications   Quit Line: 1-800-QUIT NOW  Free Smoking Cessation Classes [phone]  CHOLESTEROL Know your levels; limit fat & cholesterol in your diet  Lipid Panel     Component Value Date/Time   CHOL 130 08/19/2014 0831   TRIG 40 08/19/2014 0831   HDL 48 08/19/2014 0831   CHOLHDL 2.7 08/19/2014 0831   VLDL 8 08/19/2014 0831   LDLCALC 74 08/19/2014 0831       Many patients benefit from treatment even if their cholesterol is at goal.  Goal: Total Cholesterol (CHOL) less than 160  Goal:  Triglycerides (TRIG) less than 150  Goal:  HDL greater than 40  Goal:  LDL (LDLCALC) less than 100   BLOOD PRESSURE American Stroke Association blood pressure target is less that 120/80 mm/Hg  Your discharge blood pressure is:  BP: 134/68 mmHg  Monitor your blood pressure  Limit your salt and alcohol intake  Many individuals will require more than one medication for high blood pressure  DIABETES (A1c is a blood sugar average for last 3 months) Goal HGBA1c is under 7% (HBGA1c is blood sugar average for last 3 months)  Diabetes: No known diagnosis of diabetes    Lab Results  Component Value Date   HGBA1C 5.6 08/19/2014     Your HGBA1c can be lowered with medications, healthy diet, and exercise.  Check your blood sugar as directed by your physician  Call your physician if you experience unexplained or low blood sugars.  PHYSICAL ACTIVITY/REHABILITATION Goal is 30 minutes at least 4 days per week  Activity: No driving, Therapies: See above Return to work: To be decided on follow up  Activity decreases your risk of heart attack and stroke and makes your heart stronger.  It helps control your weight and blood pressure; helps you relax and can improve your mood.  Participate in a regular exercise program.  Talk with your doctor about the best form of exercise for you (dancing, walking, swimming, cycling).  DIET/WEIGHT Goal is to maintain a healthy weight  Your discharge diet is: Diet Heart  Your height is:  Height:  (185.4 cm) Your current weight is: Weight: 111.3 kg (245 lb 6 oz) Your Body Mass Index (BMI) is:  BMI (Calculated): 32.4  Following the type of diet specifically designed for you will help prevent another stroke.  Your goal weight is:  189 lbs  Your goal Body Mass Index (BMI) is 19-24.  Healthy food habits can help  reduce 3 risk factors for stroke:  High cholesterol, hypertension, and excess weight.  RESOURCES Stroke/Support Group:  Call [phone]   STROKE EDUCATION PROVIDED/REVIEWED AND GIVEN TO PATIENT Stroke warning signs and symptoms How to activate emergency medical system (call 911). Medications prescribed at discharge. Need for follow-up after discharge. Personal risk factors for stroke. Pneumonia vaccine given:  Flu vaccine given:  My questions have been answered, the writing is legible, and I understand these instructions.  I will adhere to these goals & educational materials that have been provided to me after my discharge from the hospital.      My questions have been answered and I understand these instructions. I will adhere to these goals and the provided educational materials after my discharge from the hospital.  Patient/Caregiver Signature _______________________________ Date __________  Clinician Signature _______________________________________ Date __________  Please bring this form and your medication list with you to all your follow-up doctor's appointments.

## 2014-09-01 NOTE — Progress Notes (Signed)
Recreational Therapy Discharge Summary Patient Details  Name: Jeffery Campbell MRN: [id number] Date of Birth: 1929/03/11 Today's Date: 09/01/2014  Long term goals set: 1  Long term goals met: 1  Comments on progress toward goals: Pt has made great progress toward goal and is ready for discharge home today at overall Mod I level ambulatory level and supervision level for community pursuits.  Pt remains motivated to regain further independence and return to previously enjoyed leisure activities....[place]  Reasons for discharge: discharge from hospital Patient/family agrees with progress made and goals achieved: Yes  Romell Wolden 09/01/2014, 8:17 AM

## 2014-09-06 ENCOUNTER — Encounter (INDEPENDENT_AMBULATORY_CARE_PROVIDER_SITE_OTHER): Payer: Commercial Managed Care - HMO

## 2014-09-06 ENCOUNTER — Other Ambulatory Visit: Payer: Self-pay

## 2014-09-06 DIAGNOSIS — I639 Cerebral infarction, unspecified: Secondary | ICD-10-CM

## 2014-09-06 NOTE — Patient Instructions (Signed)
Your Doctor has ordered you to wear a heart monitor. You will wear this for 30 days.   TIPS -  REMINDERS 1. The sensor is the lanyard that is worn around your neck every day - this is powered by a battery that needs to be changed every day 2. The monitor is the device that allows you to record symptoms - this will need to be charged daily 3. The sensor & monitor need to be within 100 feet of each other at all times 4. The sensor connects to the electrodes (stickers) - these should be changed every 24-48 hours (you do not have to remove them when you bathe, just make sure they are dry when you connect it back to the sensor 5. If you need more supplies (electrodes, batteries), please call the 1-800 # on the back of the pamphlet and [employer] will mail you more supplies 6. If your skin becomes sensitive, please try the sample pack of sensitive skin electrodes (the white packet in your silver box) and call [employer] to have them mail you more of these type of electrodes 7. When you are finish wearing the monitor, please place all supplies back in the silver box, place the silver box in the pre-packaged UPS bag and drop off at [employer] or call them so they can come pick it up   Cardiac Event Monitoring A cardiac event monitor is a small recording device used to help detect abnormal heart rhythms (arrhythmias). The monitor is used to record heart rhythm when noticeable symptoms such as the following occur:  Fast heartbeats (palpitations), such as heart racing or fluttering.  Dizziness.  Fainting or light-headedness.  Unexplained weakness. The monitor is wired to two electrodes placed on your chest. Electrodes are flat, sticky disks that attach to your skin. The monitor can be worn for up to 30 days. You will wear the monitor at all times, except when bathing.  HOW TO USE YOUR CARDIAC EVENT MONITOR A technician will prepare your chest for the electrode placement. The technician will show you how to place  the electrodes, how to work the monitor, and how to replace the batteries. Take time to practice using the monitor before you leave the office. Make sure you understand how to send the information from the monitor to your health care provider. This requires a telephone with a landline, not a cell phone. You need to:  Wear your monitor at all times, except when you are in water:  Do not get the monitor wet.  Take the monitor off when bathing. Do not swim or use a hot tub with it on.  Keep your skin clean. Do not put body lotion or moisturizer on your chest.  Change the electrodes daily or any time they stop sticking to your skin. You might need to use tape to keep them on.  It is possible that your skin under the electrodes could become irritated. To keep this from happening, try to put the electrodes in slightly different places on your chest. However, they must remain in the area under your left breast and in the upper right section of your chest.  Make sure the monitor is safely clipped to your clothing or in a location close to your body that your health care provider recommends.  Press the button to record when you feel symptoms of heart trouble, such as dizziness, weakness, light-headedness, palpitations, thumping, shortness of breath, unexplained weakness, or a fluttering or racing heart. The monitor is always   on and records what happened slightly before you pressed the button, so do not worry about being too late to get good information.  Keep a diary of your activities, such as walking, doing chores, and taking medicine. It is especially important to note what you were doing when you pushed the button to record your symptoms. This will help your health care provider determine what might be contributing to your symptoms. The information stored in your monitor will be reviewed by your health care provider alongside your diary entries.  Send the recorded information as recommended by your  health care provider. It is important to understand that it will take some time for your health care provider to process the results.  Change the batteries as recommended by your health care provider. SEEK IMMEDIATE MEDICAL CARE IF:   You have chest pain.  You have extreme difficulty breathing or shortness of breath.  You develop a very fast heartbeat that persists.  You develop dizziness that does not go away.  You faint or constantly feel you are about to faint. Document Released: 12/27/2007 Document Revised: 08/03/2013 Document Reviewed: 09/15/2012 [employer] Patient Information 2015 [employer]. This information is not intended to replace advice given to you by your health care provider. Make sure you discuss any questions you have with your health care provider.  

## 2014-09-16 ENCOUNTER — Telehealth: Payer: Self-pay | Admitting: Family Medicine

## 2014-09-16 NOTE — Telephone Encounter (Signed)
Called patient back. Patient stated he had a stroke and wanted to no if it was ok for him to drive. Patient was informed to keep his appointment for Monday and discuss the issue with Dr. Thana Ates at that time since we have not seen him for this issue. Patient verbalized understanding.

## 2014-09-16 NOTE — Telephone Encounter (Signed)
Pt is requesting return call [phone]

## 2014-09-20 ENCOUNTER — Ambulatory Visit (INDEPENDENT_AMBULATORY_CARE_PROVIDER_SITE_OTHER): Payer: Commercial Managed Care - HMO | Admitting: Family Medicine

## 2014-09-20 ENCOUNTER — Encounter: Payer: Self-pay | Admitting: Family Medicine

## 2014-09-20 VITALS — BP 142/88 | HR 68 | Temp 97.8°F | Resp 16 | Ht 69.0 in | Wt 243.2 lb

## 2014-09-20 DIAGNOSIS — N183 Chronic kidney disease, stage 3 unspecified: Secondary | ICD-10-CM

## 2014-09-20 DIAGNOSIS — G8194 Hemiplegia, unspecified affecting left nondominant side: Secondary | ICD-10-CM

## 2014-09-20 DIAGNOSIS — G8190 Hemiplegia, unspecified affecting unspecified side: Secondary | ICD-10-CM

## 2014-09-20 DIAGNOSIS — I10 Essential (primary) hypertension: Secondary | ICD-10-CM

## 2014-09-20 DIAGNOSIS — I639 Cerebral infarction, unspecified: Secondary | ICD-10-CM | POA: Diagnosis not present

## 2014-09-20 DIAGNOSIS — E669 Obesity, unspecified: Secondary | ICD-10-CM | POA: Diagnosis not present

## 2014-09-20 DIAGNOSIS — E785 Hyperlipidemia, unspecified: Secondary | ICD-10-CM | POA: Diagnosis not present

## 2014-09-20 DIAGNOSIS — I6381 Other cerebral infarction due to occlusion or stenosis of small artery: Secondary | ICD-10-CM

## 2014-09-20 NOTE — Progress Notes (Signed)
Name: Jeffery Campbell   MRN: [id number]    DOB: 1928/12/03   Date:09/20/2014       Progress Note  Subjective  Chief Complaint  Chief Complaint  Patient presents with  . Cerebrovascular Accident    Cerebrovascular Accident This is a new problem. The current episode started 1 to 4 weeks ago. The problem occurs constantly. The problem has been gradually improving. Pertinent negatives include no chest pain, chills, congestion, coughing, fever, headaches, myalgias, nausea, neck pain, sore throat, vomiting or weakness. The symptoms are aggravated by exertion. He has tried walking for the symptoms. The treatment provided mild relief.  Hypertension This is a chronic problem. The current episode started more than 1 year ago. The problem has been gradually worsening since onset. The problem is uncontrolled. Associated symptoms include anxiety. Pertinent negatives include no blurred vision, chest pain, headaches, neck pain, orthopnea, palpitations or shortness of breath. Risk factors for coronary artery disease include dyslipidemia, male gender and obesity. Past treatments include calcium channel blockers. There are no compliance problems.  Hypertensive end-organ damage includes kidney disease and CVA. Identifiable causes of hypertension include chronic renal disease and a hypertension causing med.  Hyperlipidemia This is a chronic problem. The current episode started more than 1 year ago. The problem is controlled. Recent lipid tests were reviewed and are normal. Exacerbating diseases include chronic renal disease and obesity. Factors aggravating his hyperlipidemia include fatty foods. Associated symptoms include a focal weakness. Pertinent negatives include no chest pain, myalgias or shortness of breath. Current antihyperlipidemic treatment includes statins. The current treatment provides moderate improvement of lipids. Risk factors for coronary artery disease include dyslipidemia, hypertension, male sex and  obesity.   he was acutely hospitalized and then spent several weeks and rehabilitation as well.   Depression  Patient remains on citalopram. He recently suffered suffered a CVA which had given him somewhat of a back said that he is improving. Currently he is near his baseline   Past Medical History  Diagnosis Date  . Diverticulitis   . Hypertension   . Pure hypercholesterolemia   . Hemorrhoids   . OA (osteoarthritis)   . Disturbance of sleep     History  Substance Use Topics  . Smoking status: Never Smoker   . Smokeless tobacco: Not on file  . Alcohol Use: No     Current outpatient prescriptions:  .  atorvastatin (LIPITOR) 20 MG tablet, Take 1 tablet (20 mg total) by mouth daily., Disp: 30 tablet, Rfl: 1 .  citalopram (CELEXA) 10 MG tablet, Take 1 tablet (10 mg total) by mouth daily., Disp: 30 tablet, Rfl: 1 .  clopidogrel (PLAVIX) 75 MG tablet, Take 1 tablet (75 mg total) by mouth daily., Disp: 30 tablet, Rfl: 1 .  cyclobenzaprine (FLEXERIL) 5 MG tablet, Take 5 mg by mouth at bedtime., Disp: , Rfl:  .  diazepam (VALIUM) 5 MG tablet, Take 5 mg by mouth every 8 (eight) hours as needed for anxiety., Disp: , Rfl:  .  diltiazem (CARDIZEM CD) 120 MG 24 hr capsule, Take 1 capsule (120 mg total) by mouth daily., Disp: 30 capsule, Rfl: 1 .  docusate sodium (COLACE) 100 MG capsule, Take 1 capsule (100 mg total) by mouth 2 (two) times daily., Disp: 60 capsule, Rfl: 0 .  lansoprazole (PREVACID) 30 MG capsule, Take 30 mg by mouth daily at 12 noon., Disp: , Rfl:  .  Omega 3 1200 MG CAPS, Take by mouth daily., Disp: , Rfl:  .  terazosin (HYTRIN) 5  MG capsule, Take 5 mg by mouth at bedtime., Disp: , Rfl:  .  vitamin B-12 (CYANOCOBALAMIN) 1000 MCG tablet, Take 2,000 mcg by mouth daily. , Disp: , Rfl:   No Known Allergies  Review of Systems  Constitutional: Negative for fever, chills and weight loss.  HENT: Negative for congestion, hearing loss, sore throat and tinnitus.   Eyes: Negative  for blurred vision, double vision and redness.  Respiratory: Negative for cough, hemoptysis and shortness of breath.   Cardiovascular: Negative for chest pain, palpitations, orthopnea, claudication and leg swelling.  Gastrointestinal: Negative for heartburn, nausea, vomiting, diarrhea, constipation and blood in stool.  Genitourinary: Negative for dysuria, urgency, frequency and hematuria.  Musculoskeletal: Negative for myalgias, back pain, joint pain, falls and neck pain.  Skin: Negative for itching.  Neurological: Positive for focal weakness. Negative for dizziness, tingling, tremors, seizures, loss of consciousness, weakness and headaches.  Endo/Heme/Allergies: Does not bruise/bleed easily.  Psychiatric/Behavioral: Positive for depression. Negative for substance abuse. The patient is not nervous/anxious and does not have insomnia.      Objective  Filed Vitals:   09/20/14 0923  BP: 142/88  Pulse: 68  Temp: 97.8 F (36.6 C)  Resp: 16  Height: 5\' 9"  (1.753 m)  Weight: 243 lb 3 oz (110.309 kg)  SpO2: 95%     Physical Exam  Constitutional: He is oriented to person, place, and time and well-developed, well-nourished, and in no distress.  HENT:  Head: Normocephalic.  Eyes: EOM are normal. Pupils are equal, round, and reactive to light.  Neck: Normal range of motion. Neck supple. No thyromegaly present.  Cardiovascular: Normal rate, regular rhythm and normal heart sounds.   No murmur heard. Pulmonary/Chest: Effort normal and breath sounds normal. No respiratory distress. He has no wheezes.  Abdominal: Soft. Bowel sounds are normal.  Musculoskeletal: Normal range of motion. He exhibits no edema.  Lymphadenopathy:    He has no cervical adenopathy.  Neurological: He is alert and oriented to person, place, and time. No cranial nerve deficit. Coordination normal.  Using an assistive device in the form of a walker since his stroke  Skin: Skin is warm and dry. No rash noted.   Psychiatric: Affect and judgment normal.      Assessment & Plan  1. Thalamic infarct, acute Minimal left-sided weakness  2. Left hemiparesis Minimal since CVA  3. Essential hypertension Near goal - Lipid Profile - COMPLETE METABOLIC PANEL WITH GFR - TSH  4. CKD (chronic kidney disease) stage 3, GFR 30-59 ml/min Has been stable.  5. HLD (hyperlipidemia) Lipid panel  6. Obesity Encourage diet, compliance

## 2014-09-20 NOTE — Patient Instructions (Addendum)
F/u 1 mo

## 2014-09-23 ENCOUNTER — Telehealth: Payer: Self-pay | Admitting: Family Medicine

## 2014-09-23 LAB — LIPID PANEL
CHOL/HDL RATIO: 2.9 ratio (ref 0.0–5.0)
Cholesterol, Total: 134 mg/dL (ref 100–199)
HDL: 46 mg/dL (ref >39)
LDL Calculated: 73 mg/dL (ref 0–99)
Triglycerides: 76 mg/dL (ref 0–149)
VLDL Cholesterol Cal: 15 mg/dL (ref 5–40)

## 2014-09-23 LAB — COMPREHENSIVE METABOLIC PANEL
ALBUMIN: 4.2 g/dL (ref 3.5–4.7)
ALK PHOS: 53 IU/L (ref 39–117)
ALT: 13 IU/L (ref 0–44)
AST: 12 IU/L (ref 0–40)
Albumin/Globulin Ratio: 1.6 (ref 1.1–2.5)
BUN / CREAT RATIO: 13 (ref 10–22)
BUN: 17 mg/dL (ref 8–27)
Bilirubin Total: 1.1 mg/dL (ref 0.0–1.2)
CO2: 23 mmol/L (ref 18–29)
CREATININE: 1.34 mg/dL — AB (ref 0.76–1.27)
Calcium: 9.3 mg/dL (ref 8.6–10.2)
Chloride: 101 mmol/L (ref 97–108)
GFR calc Af Amer: 56 mL/min/{1.73_m2} — ABNORMAL LOW (ref >59)
GFR calc non Af Amer: 49 mL/min/{1.73_m2} — ABNORMAL LOW (ref >59)
GLOBULIN, TOTAL: 2.7 g/dL (ref 1.5–4.5)
Glucose: 105 mg/dL — ABNORMAL HIGH (ref 65–99)
Potassium: 4.6 mmol/L (ref 3.5–5.2)
Sodium: 141 mmol/L (ref 134–144)
TOTAL PROTEIN: 6.9 g/dL (ref 6.0–8.5)

## 2014-09-23 LAB — TSH: TSH: 4.02 u[IU]/mL (ref 0.450–4.500)

## 2014-09-23 NOTE — Telephone Encounter (Signed)
Katie PT with [facility] would like for you to know that she plans to discharge patient from home care next week and he would like to send patient to outpatient therapy at [facility], could you write a RX for outpatient PT to begin 10/04/2014. [phone]. Fax # to outpatient therapy clinic is [phone].

## 2014-09-23 NOTE — Progress Notes (Signed)
Patient notified of lab results

## 2014-09-23 NOTE — Telephone Encounter (Signed)
Order faxed to [facility] Outpatient PT

## 2014-09-24 ENCOUNTER — Other Ambulatory Visit: Payer: Self-pay | Admitting: Family Medicine

## 2014-09-29 ENCOUNTER — Telehealth: Payer: Self-pay | Admitting: Cardiovascular Disease

## 2014-09-29 NOTE — Telephone Encounter (Signed)
SPOKE TO PATIENT INFORMED PATIENT MAY TAKE MONITOR OFF DURING THE TIME HE IS WET THEN PLACE MONITOR BACK ON AND RETURN MONITOR ON 10/06/14 TO [facility].

## 2014-09-29 NOTE — Telephone Encounter (Signed)
Pt wearing monitor,he is supposed to take it off [phone]. He would like to take it off today or tomorrow,wants to go swimming.

## 2014-10-06 ENCOUNTER — Ambulatory Visit: Payer: Commercial Managed Care - HMO | Attending: Family Medicine

## 2014-10-06 VITALS — BP 137/67 | HR 70

## 2014-10-06 DIAGNOSIS — R2681 Unsteadiness on feet: Secondary | ICD-10-CM

## 2014-10-06 NOTE — Therapy (Signed)
[facility] MAIN [facility] SERVICES [address] [place], [place], [address] Phone: [phone]   Fax:  [phone]  Physical Therapy Evaluation  Patient Details  Name: Jeffery Campbell MRN: [id number] Date of Birth: 01-Oct-1928 Referring Provider:  Dennison Mascot, MD  Encounter Date: 10/06/2014      PT End of Session - 10/06/14 1226    Visit Number 1   Number of Visits 9   Date for PT Re-Evaluation 11/03/14   Authorization Type 1/10   PT Start Time 0930   PT Stop Time 1030   PT Time Calculation (min) 60 min   Equipment Utilized During Treatment Gait belt   Activity Tolerance Patient tolerated treatment well   Behavior During Therapy [facility] for tasks assessed/performed      Past Medical History  Diagnosis Date  . Diverticulitis   . Hypertension   . Pure hypercholesterolemia   . Hemorrhoids   . OA (osteoarthritis)   . Disturbance of sleep     Past Surgical History  Procedure Laterality Date  . Transurethral resection of prostate    . Knee arthroscopy    . Colonoscopy      Filed Vitals:   10/06/14 1048  BP: 137/67  Pulse: 70    Visit Diagnosis:  Unsteadiness on feet - Plan: PT plan of care cert/re-cert      Subjective Assessment - 10/06/14 1056    Subjective pt is a 81 year old male who had an MRI that revealed acute ischemia right lateral thalamus versus posterior limb of the internal capsule on 08/19/14.  pt relates he fell out of bed on the day he had his stroke and was transported to [facility] for treatment. pt had inpatient PT services for a week and was discharged from PT on 5/31.  pt relates he had weakness on his left side primairly in his LE but his strength has returned to baseline.  pt relates he used to play golf witout an assistive device prior to stroke but now uses a straight cane for safety while ambulating outside his home.  pt relates he would like to work on his walking and improve his fear of falling.     Patient  Stated Goals improve confidence with walking and improve walking ability    Currently in Pain? No/denies   Pain Score 0-No pain            [facility] PT Assessment - 10/06/14 0001    Assessment   Medical Diagnosis acute ischemia right lateral thalamus   Onset Date/Surgical Date 08/19/14   Hand Dominance Right   Prior Therapy inpatient rehab at [place] for 1 week   Precautions   Precautions Fall   Restrictions   Weight Bearing Restrictions No   Balance Screen   Has the patient fallen in the past 6 months Yes   How many times? 1   Has the patient had a decrease in activity level because of a fear of falling?  Yes   Is the patient reluctant to leave their home because of a fear of falling?  No   Home Nurse, mental health Private residence   Living Arrangements Other relatives  grandson   Available Help at Discharge Family   Type of Home House   Home Access Stairs to enter   Entrance Stairs-Number of Steps 1   Entrance Stairs-Rails None   Home Layout One level   Home Equipment Walker - 2 wheels;Walker - 4 wheels;Cane -  single point;Grab bars - toilet;Tub bench   Prior Function   Level of Independence Independent   Vocation Retired   CopyCognition   Overall Cognitive Status Within Functional Limits for tasks assessed   Sensation   Light Touch Appears Intact   Coordination   Gross Motor Movements are Fluid and Coordinated Yes   Heel Shin Test within functional limits   ROM / Strength   AROM / PROM / Strength AROM;Strength   AROM   Overall AROM  Within functional limits for tasks performed   Strength   Overall Strength Within functional limits for tasks performed   Bed Mobility   Rolling Right 6: Modified independent (Device/Increase time)   Right Sidelying to Sit 6: Modified independent (Device/Increase time)   Transfers   Sit to Stand 6: Modified independent (Device/Increase time)   Stand to Sit 6: Modified independent (Device/Increase time)   Ambulation/Gait    Ambulation/Gait Yes   Ambulation/Gait Assistance 5: Supervision;6: Modified independent (Device/Increase time)   Assistive device Straight cane   Gait Pattern Step-through pattern;Decreased stride length;Wide base of support   Ambulation Surface Level   Gait velocity 0.49 m/s with cane and 0.52 m/s without cane   Wheelchair Mobility   Wheelchair Mobility No   Balance   Balance Assessed Yes   Standardized Balance Assessment   Standardized Balance Assessment Berg Balance Test   Berg Balance Test   Sit to Stand Able to stand without using hands and stabilize independently   Standing Unsupported Able to stand 2 minutes with supervision   Sitting with Back Unsupported but Feet Supported on Floor or Stool Able to sit safely and securely 2 minutes   Stand to Sit Sits safely with minimal use of hands   Transfers Able to transfer safely, minor use of hands   Standing Unsupported with Eyes Closed Able to stand 10 seconds with supervision   Standing Ubsupported with Feet Together Able to place feet together independently and stand for 1 minute with supervision   From Standing, Reach Forward with Outstretched Arm Can reach forward >12 cm safely (5")   From Standing Position, Pick up Object from Floor Able to pick up shoe, needs supervision   From Standing Position, Turn to Look Behind Over each Shoulder Looks behind from both sides and weight shifts well   Turn 360 Degrees Able to turn 360 degrees safely one side only in 4 seconds or less   Standing Unsupported, Alternately Place Feet on Step/Stool Able to stand independently and complete 8 steps >20 seconds   Standing Unsupported, One Foot in Front Able to place foot tandem independently and hold 30 seconds   Standing on One Leg Able to lift leg independently and hold 5-10 seconds   Total Score 48          outcome measures 2661m walk: With cane is 0.49 m/s  without cane is 0.52 which places him in the limited community group and risk of  falls.   Berg balance score is 48/56  5x sit to stand is 18.37 seconds                  PT Education - 10/06/14 1224    Education provided Yes   Education Details on risk of falls based on performance on outcome measures, to continue walking as long as he feels safe and not to walk on treadmill until we assess him in our facility for safety.    Person(s) Educated Patient   Methods Explanation  Comprehension Verbalized understanding             PT Long Term Goals - 10-20-2014 1240    PT LONG TERM GOAL #1   Title pt will ambulate with a gait velocity of at 0.8 m/s without an assistive device in order ambulate in the community   Baseline 0.52 m/s without cane and 0.49 m/s with cane   Time 4   Period Weeks   Status New   PT LONG TERM GOAL #2   Title patient will perform 5x sit to stand in less than 10.6 seconds in order to decrease risk of fall and improve functional strength    Baseline 18.37 seconds without UE assist   Time 4   Period Weeks   Status New   PT LONG TERM GOAL #3   Title pt be able to ambulate safely in unleveld surface x200 ft without assistive device in order return to golfing   Baseline able to ambulate level surface with cane for safety    Time 4   Period Days   Status New               Plan - Oct 20, 2014 1228    Clinical Impression Statement pt is a 81 year old male who experienced CVA in right lateral thalamus versus posterior limb of the internal capsule on 08/19/14.  pt presents with strength WNL, AROM WFL and no pain. pt is able to ambulate without and without cane with supervision, but is unsteady at times but did not experience any LOB.  pt 's gait velocity with cane is 0.49 m/s and without cane is 0.52  which places him in the limited community group and risk of falls.  His Berg balance score is 48 and and his 5x sit to stand is 18.37 seconds which both place him at risk of falls.  based on his presentation and performance today, pt  would benefit from skilled PT services such as moderate-high level balance interventions to improve balance and functional mobility.    Pt will benefit from skilled therapeutic intervention in order to improve on the following deficits Decreased endurance;Decreased activity tolerance;Decreased balance;Decreased mobility   Rehab Potential Good   PT Frequency 2x / week   PT Duration 4 weeks   PT Treatment/Interventions Gait training;Stair training;Neuromuscular re-education;Balance training;Therapeutic exercise;Therapeutic activities;Functional mobility training   PT Next Visit Plan 6 min walk and higher level balance interventions          G-Codes - 20-Oct-2014 1245    Functional Assessment Tool Used outcome measures, clinical judgement   Functional Limitation Mobility: Walking and moving around   Mobility: Walking and Moving Around Current Status (Z6109) At least 1 percent but less than 20 percent impaired, limited or restricted  ~19 % impaired   Mobility: Walking and Moving Around Goal Status [phone]) At least 1 percent but less than 20 percent impaired, limited or restricted       Problem List Patient Active Problem List   Diagnosis Date Noted  . Thalamic infarct, acute   . Left hemiparesis   . CVA (cerebral infarction) 08/19/2014  . Stroke 08/19/2014  . Hypertension 08/19/2014  . HLD (hyperlipidemia) 08/19/2014  . CKD (chronic kidney disease) stage 3, GFR 30-59 ml/min 08/19/2014  . Accelerated hypertension   . 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 Obesity   . Asymptomatic PVCs 05/27/2014  . Bradycardia 04/05/2014  . Essential hypertension 04/05/2014   Janus Molder, SPT Tortorici,Ashley Oct 20, 2014,  1:43 PM This entire session was performed under direct supervision and direction of a licensed therapist/therapist assistant . I have personally read, edited and approve of the note as written.  Carlyon Shadow. Tortorici, PT, DPT [phone]  [facility] MAIN [facility] SERVICES [address] [place], [place], [address] Phone: [phone]   Fax:  [phone]

## 2014-10-13 ENCOUNTER — Telehealth: Payer: Self-pay | Admitting: Family Medicine

## 2014-10-13 ENCOUNTER — Ambulatory Visit: Payer: Commercial Managed Care - HMO

## 2014-10-13 DIAGNOSIS — R2681 Unsteadiness on feet: Secondary | ICD-10-CM

## 2014-10-13 NOTE — Therapy (Signed)
[place] [facility] MAIN [facility] [address] [place], [place], [address] Phone: [phone]   Fax:  [phone]  Physical Therapy Treatment  Patient Details  Name: Jeffery Campbell MRN: [id number] Date of Birth: Nov 03, 1928 Referring Provider:  Dennison MascotMorrisey, [place], MD  Encounter Date: 10/13/2014      PT End of Session - 10/13/14 1145    Visit Number 2   Number of Visits 9   Date for PT Re-Evaluation 11/03/14   Authorization Type 2/10   PT Start Time 0945   PT Stop Time 1030   PT Time Calculation (min) 45 min   Equipment Utilized During Treatment Gait belt   Activity Tolerance Patient tolerated treatment well   Behavior During Therapy [facility] for tasks assessed/performed      Past Medical History  Diagnosis Date  . Diverticulitis   . Hypertension   . Pure hypercholesterolemia   . Hemorrhoids   . OA (osteoarthritis)   . Disturbance of sleep     Past Surgical History  Procedure Laterality Date  . Transurethral resection of prostate    . Knee arthroscopy    . Colonoscopy      There were no vitals filed for this visit.  Visit Diagnosis:  Unsteadiness on feet      Subjective Assessment - 10/13/14 1140    Subjective pt relates he is doing well and walked ~1.5 miles this morning on the track.  pt reports he will have to use his cane while ambulating because he is tired from walking.  pt relates the results from telemetry indicate he has a healthy heart.    Patient Stated Goals improve confidence with walking and improve walking ability    Currently in Pain? No/denies          there ex: 6 minute walk test: 750 ft L SLR x10, pt required verbal cues and education on the purpose of this exercise  Bilateral leg press with 100#, 2x10 L LE leg press with 75# 2x10 Pt required verbal cues to perform leg press at slower pace to improve LE control Sit to stand x10   Neuro re-ed Squats on airex 2x10 Step up/downs on 4 inch step, pt  required verbal cueing to look up and to perform activity at slower pace in order focus on clearing the L foot Bilateral Forward step with mini lunge/side step x 10 each.  Pt required verbal cueing on correct form and to focus on clearing the left foot                           PT Education - 10/13/14 1143    Education provided Yes   Education Details pt should resume SLR to improve hip flexion, he can perform the activities he feels safe doing   Person(s) Educated Patient   Methods Explanation   Comprehension Verbalized understanding             PT Long Term Goals - 10/06/14 1240    PT LONG TERM GOAL #1   Title pt will ambulate with a gait velocity of at 0.8 m/s without an assistive device in order ambulate in the community   Baseline 0.52 m/s without cane and 0.49 m/s with cane   Time 4   Period Weeks   Status New   PT LONG TERM GOAL #2   Title patient will perform 5x sit to stand in less than 10.6 seconds in order to decrease risk  of fall and improve functional strength    Baseline 18.37 seconds without UE assist   Time 4   Period Weeks   Status New   PT LONG TERM GOAL #3   Title pt be able to ambulate safely in unleveld surface x200 ft without assistive device in order return to golfing   Baseline able to ambulate level surface with cane for safety    Time 4   Period Days   Status New               Plan - 10/13/14 1146    Clinical Impression Statement pt is able to ambulate 750 ft in 6 minutes today wiht vitals of 137/56 and 41 bpm post test.  pt demonstrates decreased functional activity tolerance due to taking 3x2-3 minute breaks and decreased L foot clearance as session progressed.  pt is able to ambulate with singe point cane R side and reciprocal gait pattern with occastional L foot drag.  pt would benefit from skilled PT servies to imprve balance, strength and functional activity tolerance   Pt will benefit from skilled therapeutic  intervention in order to improve on the following deficits Decreased endurance;Decreased activity tolerance;Decreased balance;Decreased mobility   Rehab Potential Good   PT Frequency 2x / week   PT Duration 4 weeks   PT Treatment/Interventions Gait training;Stair training;Neuromuscular re-education;Balance training;Therapeutic exercise;Therapeutic activities;Functional mobility training   PT Next Visit Plan strength and balane activities, active recovery         Problem List Patient Active Problem List   Diagnosis Date Noted  . Thalamic infarct, acute   . Left hemiparesis   . CVA (cerebral infarction) 08/19/2014  . Stroke 08/19/2014  . Hypertension 08/19/2014  . HLD (hyperlipidemia) 08/19/2014  . CKD (chronic kidney disease) stage 3, GFR 30-59 ml/min 08/19/2014  . Accelerated hypertension   . 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 Obesity   . Asymptomatic PVCs 05/27/2014  . Bradycardia 04/05/2014  . Essential hypertension 04/05/2014   Janus Molder, SPT Janus Molder 10/13/2014, 12:39 PM This entire session was performed under direct supervision and direction of a licensed therapist/therapist assistant . I have personally read, edited and approve of the note as written. Carlyon Shadow. Tortorici, PT, DPT [phone]  [facility] MAIN [facility] SERVICES [address] [place], [place], [address] Phone: [phone]   Fax:  [phone]

## 2014-10-13 NOTE — Telephone Encounter (Signed)
THE PATIENT IS ASKING THAT HIS MEDS BELOW BE SENT TO MAIL ORDER FROM NOW ON. [employer] MAIL ORDER. PATIENT SAID TO TELL YOU ALSO THAT HE WORE THE HEART MONITOR AND EVERYTHING TURNED OUT OK. 1. CLOPIDOGREL 75 MG 2. CYCLOBENZAPRINE 

## 2014-10-18 ENCOUNTER — Ambulatory Visit: Payer: Commercial Managed Care - HMO

## 2014-10-18 DIAGNOSIS — R2681 Unsteadiness on feet: Secondary | ICD-10-CM | POA: Diagnosis not present

## 2014-10-18 NOTE — Therapy (Signed)
[place] [facility] [address] [place], [place], [address] Phone: [phone]   Fax:  [phone]  Physical Therapy Treatment  Patient Details  Name: Jeffery BrittleJoe Campbell MRN: [id number] Date of Birth: [birth year] Referring Provider:  Dennison MascotMorrisey, [place], MD  Encounter Date: 10/18/2014      PT End of Session - 10/18/14 1141    Visit Number 3   Number of Visits 9   Date for PT Re-Evaluation 11/03/14   Authorization Type 3/10   PT Start Time 1040   PT Stop Time 1128   PT Time Calculation (min) 48 min   Equipment Utilized During Treatment Gait belt   Activity Tolerance Patient tolerated treatment well   Behavior During Therapy [facility] for tasks assessed/performed      Past Medical History  Diagnosis Date  . Diverticulitis   . Hypertension   . Pure hypercholesterolemia   . Hemorrhoids   . OA (osteoarthritis)   . Disturbance of sleep     Past Surgical History  Procedure Laterality Date  . Transurethral resection of prostate    . Knee arthroscopy    . Colonoscopy      There were no vitals filed for this visit.  Visit Diagnosis:  Unsteadiness on feet      Subjective Assessment - 10/18/14 1136    Subjective pt relates he is doing well and reports he is dragging his left leg less.  pt relates he [facility] not [facility] and used the cart as an assistive device. Pt denies any pain.    Patient Stated Goals improve confidence with walking and improve walking ability    Currently in Pain? No/denies   Pain Score 0-No pain        there ex: Bilateral leg press with 100#, 2x10 Sit to stand x10 Marching x20 Pt required verbal cues to perform leg press at slower pace to improve LE control   Neuro re-ed Sidestepping on airex matt x4 lengths of //bars Tandem stepping on airex mat x4 lengths of //bars Lunge stance on airex with UE overhead yellow ball (2000) press and forward press 2x10 steping  over airex, stepping over 4inch step, followed by stepping over airex 4x lengths of //bars Toe taps on 4 inch step 2x10 Bilateral Forward step with mini lunge/side step x 10 each. Pt required verbal cueing on correct form and to focus on clearing the left foot  Forward marching 4x lengths of // bars with emphasis on lifting L LE  Side stepping 4x lengths of //bars without UE use Pt required min-mod verbal and tactile cues to maintain upright posture, look forward versus down, and to focus on clearing L foot versus dragging   Pre session BP: 118/59 Post session BP: 143/71 with pulse 32 bpm                               PT Education - 10/18/14 1139    Education provided Yes   Education Details pt advised to perform only activities he feels safe and or has someone for safety   Person(s) Educated Patient   Methods Explanation   Comprehension Verbalized understanding             PT Long Term Goals - 10/06/14 1240    PT LONG TERM GOAL #1   Title pt will ambulate with a gait velocity of at 0.8 m/s  without an assistive device in order ambulate in the community   Baseline 0.52 m/s without cane and 0.49 m/s with cane   Time 4   Period Weeks   Status New   PT LONG TERM GOAL #2   Title patient will perform 5x sit to stand in less than 10.6 seconds in order to decrease risk of fall and improve functional strength    Baseline 18.37 seconds without UE assist   Time 4   Period Weeks   Status New   PT LONG TERM GOAL #3   Title pt be able to ambulate safely in unleveld surface x200 ft without assistive device in order return to golfing   Baseline able to ambulate level surface with cane for safety    Time 4   Period Days   Status New               Plan - 10/18/14 1142    Clinical Impression Statement pt was able to complete session without use of single point cane.  pt demonstrates decreased functional activity tolerance due to taking 1-2 min breaks  throughout the session ~5x.  pt presents occastional  left foot drag which improved with verbal cueing to increase knee/hip flexion and to perform activities at slower pace for imcreased control. pt would benefti from skilled PT services to improve functional activity tolerance, strength, balance.     Pt will benefit from skilled therapeutic intervention in order to improve on the following deficits Decreased endurance;Decreased activity tolerance;Decreased balance;Decreased mobility   Rehab Potential Good   PT Frequency 2x / week   PT Duration 4 weeks   PT Treatment/Interventions Gait training;Stair training;Neuromuscular re-education;Balance training;Therapeutic exercise;Therapeutic activities;Functional mobility training   PT Next Visit Plan strength and balane activities, active recovery         Problem List Patient Active Problem List   Diagnosis Date Noted  . Thalamic infarct, acute   . Left hemiparesis   . CVA (cerebral infarction) 08/19/2014  . Stroke 08/19/2014  . Hypertension 08/19/2014  . HLD (hyperlipidemia) 08/19/2014  . CKD (chronic kidney disease) stage 3, GFR 30-59 ml/min 08/19/2014  . Accelerated hypertension   . 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 Obesity   . Asymptomatic PVCs 05/27/2014  . Bradycardia 04/05/2014  . Essential hypertension 04/05/2014   Janus Molder, SPT Janus Molder 10/18/2014, 11:49 AM This entire session was performed under direct supervision and direction of a licensed therapist/therapist assistant . I have personally read, edited and approve of the note as written. Carlyon Shadow. Tortorici, PT, DPT [phone]  [facility] MAIN [facility] SERVICES [address] [place], [place], [address] Phone: [phone]   Fax:  [phone]

## 2014-10-20 ENCOUNTER — Ambulatory Visit: Payer: Commercial Managed Care - HMO

## 2014-10-20 DIAGNOSIS — R2681 Unsteadiness on feet: Secondary | ICD-10-CM

## 2014-10-20 NOTE — Therapy (Signed)
[facility] MAIN [facility] SERVICES [address] [place], [place], [address] Phone: [phone]   Fax:  [phone]  Physical Therapy Treatment  Patient Details  Name: Jeffery Campbell MRN: [id number] Date of Birth: 1928-12-18 Referring Provider:  Dennison Mascot, MD  Encounter Date: 10/20/2014      PT End of Session - 10/20/14 1608    Visit Number 4   Number of Visits 9   Date for PT Re-Evaluation 11/03/14   Authorization Type 4/10   PT Start Time 1047   PT Stop Time 1130   PT Time Calculation (min) 43 min   Equipment Utilized During Treatment Gait belt   Activity Tolerance Patient tolerated treatment well   Behavior During Therapy [facility] for tasks assessed/performed      Past Medical History  Diagnosis Date  . Diverticulitis   . Hypertension   . Pure hypercholesterolemia   . Hemorrhoids   . OA (osteoarthritis)   . Disturbance of sleep   . Stroke     Past Surgical History  Procedure Laterality Date  . Transurethral resection of prostate    . Knee arthroscopy    . Colonoscopy      There were no vitals filed for this visit.  Visit Diagnosis:  Unsteadiness on feet      Subjective Assessment - 10/20/14 1606    Subjective pt relates he is feeling well and is looking forward to therapy   Patient Stated Goals improve confidence with walking and improve walking ability    Currently in Pain? No/denies   Pain Score 0-No pain      Gait training  Ambulating in uneven ground outside ~ 100 ft Ambulating while due tasking ~50 ft Side stepping ~ 20 ft x2 Backwards stepping ~20 ft Pt required none to minimal verbal cue to lift L LE to increase clearing his foot. Pt required supervision-CGA for safety Bilateral hip flexion/abduction/extension with red band 2x10, pt required verbal and tactile cues for correct form.   Neuro re-ed Bilateral toe taps on 3 cones x3 each, pt required CGA Forward stepping and side stepping over obstacle  course: 6 inch step, foam roll, airex matt,  4 inch step. 5x each pt required min verbal cues to maintain upright posture, look forward versus down, and to focus on clearing L foot versus dragging   BP post treatment: 160/74                          PT Education - 10/20/14 1607    Education provided Yes   Education Details to continue walking and doing HEP, he can practice toe taps on steps to increaes lifting L foot   Person(s) Educated Patient   Methods Explanation   Comprehension Verbalized understanding             PT Long Term Goals - 10/06/14 1240    PT LONG TERM GOAL #1   Title pt will ambulate with a gait velocity of at 0.8 m/s without an assistive device in order ambulate in the community   Baseline 0.52 m/s without cane and 0.49 m/s with cane   Time 4   Period Weeks   Status New   PT LONG TERM GOAL #2   Title patient will perform 5x sit to stand in less than 10.6 seconds in order to decrease risk of fall and improve functional strength    Baseline 18.37 seconds without UE assist   Time 4  Period Weeks   Status New   PT LONG TERM GOAL #3   Title pt be able to ambulate safely in unleveld surface x200 ft without assistive device in order return to golfing   Baseline able to ambulate level surface with cane for safety    Time 4   Period Days   Status New               Plan - 10/20/14 1609    Clinical Impression Statement pt experienced one loss of balance while looking to the left and walking forward but was able to self-recover.  pt was able to ambulate in unveven ground outside without using cane with no LOB.  pt is clearing his left foot better but occasionally drags it during ambulation.  pt was able to perform dynamic balance activiteis with more challenge vs static balacne activities.  He requires verbal cues to lift his L foot to clear obstacles occasionally.  pt would benefit from skilled PT services to improve remaining deficits  including vestibular training during ambulation.    Pt will benefit from skilled therapeutic intervention in order to improve on the following deficits Decreased endurance;Decreased activity tolerance;Decreased balance;Decreased mobility   Rehab Potential Good   PT Frequency 2x / week   PT Duration 4 weeks   PT Treatment/Interventions Gait training;Stair training;Neuromuscular re-education;Balance training;Therapeutic exercise;Therapeutic activities;Functional mobility training   PT Next Visit Plan strength and balane activities, active recovery         Problem List Patient Active Problem List   Diagnosis Date Noted  . Thalamic infarct, acute   . Left hemiparesis   . CVA (cerebral infarction) 08/19/2014  . Stroke 08/19/2014  . Hypertension 08/19/2014  . HLD (hyperlipidemia) 08/19/2014  . CKD (chronic kidney disease) stage 3, GFR 30-59 ml/min 08/19/2014  . Accelerated hypertension   . 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 Obesity   . Asymptomatic PVCs 05/27/2014  . Bradycardia 04/05/2014  . Essential hypertension 04/05/2014   Janus MolderJorge Kumar Falwell, SPT This entire session was performed under direct supervision and direction of a licensed therapist/therapist assistant . I have personally read, edited and approve of the note as written. Carlyon ShadowAshley C. Tortorici, PT, DPT [phone]  Tortorici,Ashley 10/21/2014, 8:19 AM  [facility] MAIN [facility] [address] [place], [place], [address] Phone: [phone]   Fax:  [phone]

## 2014-10-21 ENCOUNTER — Encounter: Payer: Self-pay | Admitting: Family Medicine

## 2014-10-21 ENCOUNTER — Other Ambulatory Visit: Payer: Self-pay | Admitting: Physical Medicine and Rehabilitation

## 2014-10-21 ENCOUNTER — Ambulatory Visit (INDEPENDENT_AMBULATORY_CARE_PROVIDER_SITE_OTHER): Payer: Commercial Managed Care - HMO | Admitting: Family Medicine

## 2014-10-21 DIAGNOSIS — I10 Essential (primary) hypertension: Secondary | ICD-10-CM

## 2014-10-21 DIAGNOSIS — E669 Obesity, unspecified: Secondary | ICD-10-CM | POA: Diagnosis not present

## 2014-10-21 DIAGNOSIS — B351 Tinea unguium: Secondary | ICD-10-CM | POA: Insufficient documentation

## 2014-10-21 DIAGNOSIS — R6882 Decreased libido: Secondary | ICD-10-CM | POA: Insufficient documentation

## 2014-10-21 DIAGNOSIS — K219 Gastro-esophageal reflux disease without esophagitis: Secondary | ICD-10-CM | POA: Insufficient documentation

## 2014-10-21 DIAGNOSIS — N183 Chronic kidney disease, stage 3 unspecified: Secondary | ICD-10-CM

## 2014-10-21 DIAGNOSIS — E8881 Metabolic syndrome: Secondary | ICD-10-CM | POA: Insufficient documentation

## 2014-10-21 DIAGNOSIS — G8190 Hemiplegia, unspecified affecting unspecified side: Secondary | ICD-10-CM

## 2014-10-21 DIAGNOSIS — I639 Cerebral infarction, unspecified: Secondary | ICD-10-CM

## 2014-10-21 DIAGNOSIS — I6381 Other cerebral infarction due to occlusion or stenosis of small artery: Secondary | ICD-10-CM

## 2014-10-21 DIAGNOSIS — M549 Dorsalgia, unspecified: Secondary | ICD-10-CM

## 2014-10-21 DIAGNOSIS — H8110 Benign paroxysmal vertigo, unspecified ear: Secondary | ICD-10-CM | POA: Insufficient documentation

## 2014-10-21 DIAGNOSIS — R5383 Other fatigue: Secondary | ICD-10-CM | POA: Insufficient documentation

## 2014-10-21 DIAGNOSIS — G8194 Hemiplegia, unspecified affecting left nondominant side: Secondary | ICD-10-CM

## 2014-10-21 DIAGNOSIS — F32A Depression, unspecified: Secondary | ICD-10-CM | POA: Insufficient documentation

## 2014-10-21 DIAGNOSIS — F329 Major depressive disorder, single episode, unspecified: Secondary | ICD-10-CM | POA: Insufficient documentation

## 2014-10-21 DIAGNOSIS — G8929 Other chronic pain: Secondary | ICD-10-CM | POA: Insufficient documentation

## 2014-10-21 DIAGNOSIS — I499 Cardiac arrhythmia, unspecified: Secondary | ICD-10-CM | POA: Insufficient documentation

## 2014-10-21 NOTE — Progress Notes (Signed)
Name: Jeffery Campbell   MRN: [id number]    DOB: Feb 28, 1929   Date:10/21/2014       Progress Note  Subjective  Chief Complaint  Chief Complaint  Patient presents with  . Transient Ischemic Attack    1 month follow up    Hypertension This is a chronic problem. The current episode started more than 1 year ago. The problem is unchanged. The problem is controlled. Pertinent negatives include no blurred vision, chest pain, headaches, neck pain, orthopnea, palpitations or shortness of breath. There are no associated agents to hypertension. Risk factors for coronary artery disease include dyslipidemia, obesity and male gender. Past treatments include calcium channel blockers and alpha 1 blockers. The current treatment provides moderate improvement. There are no compliance problems.  Hypertensive end-organ damage includes CVA.  Hyperlipidemia This is a chronic problem. The current episode started more than 1 year ago. The problem is controlled. Recent lipid tests were reviewed and are normal. Exacerbating diseases include obesity. Factors aggravating his hyperlipidemia include fatty foods. Associated symptoms include a focal weakness. Pertinent negatives include no chest pain, myalgias or shortness of breath. Current antihyperlipidemic treatment includes statins. The current treatment provides moderate improvement of lipids. There are no compliance problems.    Subacute CVA  Patient suffered a left thalamic CVA which required hospitalization and a short rehabilitation stay at [facility]. He is still doing home and outpatient physical therapy. He still has mild residual left-sided weakness and mild gait abnormality but is walking without a cane. He is assisting her neurologist again in the next 1-2 weeks   Past Medical History  Diagnosis Date  . Diverticulitis   . Hypertension   . Pure hypercholesterolemia   . Hemorrhoids   . OA (osteoarthritis)   . Disturbance of sleep   . Stroke     History   Substance Use Topics  . Smoking status: Never Smoker   . Smokeless tobacco: Not on file  . Alcohol Use: No     Current outpatient prescriptions:  .  atorvastatin (LIPITOR) 20 MG tablet, TAKE 1 TABLET BY MOUTH ONCE A DAY, Disp: 30 tablet, Rfl: 1 .  citalopram (CELEXA) 10 MG tablet, TAKE 1 TABLET BY MOUTH DAILY, Disp: 90 tablet, Rfl: 1 .  clopidogrel (PLAVIX) 75 MG tablet, Take 1 tablet (75 mg total) by mouth daily., Disp: 30 tablet, Rfl: 1 .  cyclobenzaprine (FLEXERIL) 5 MG tablet, TAKE 1 TABLET BY MOUTH AT BEDTIME, Disp: 30 tablet, Rfl: 2 .  diazepam (VALIUM) 5 MG tablet, Take 5 mg by mouth every 8 (eight) hours as needed for anxiety., Disp: , Rfl:  .  diltiazem (CARDIZEM CD) 120 MG 24 hr capsule, Take 1 capsule (120 mg total) by mouth daily., Disp: 30 capsule, Rfl: 1 .  docusate sodium (COLACE) 100 MG capsule, Take 1 capsule (100 mg total) by mouth 2 (two) times daily., Disp: 60 capsule, Rfl: 0 .  lansoprazole (PREVACID) 30 MG capsule, Take 30 mg by mouth daily at 12 noon., Disp: , Rfl:  .  Omega 3 1200 MG CAPS, Take by mouth daily., Disp: , Rfl:  .  terazosin (HYTRIN) 5 MG capsule, Take 5 mg by mouth at bedtime., Disp: , Rfl:  .  vitamin B-12 (CYANOCOBALAMIN) 1000 MCG tablet, Take 2,000 mcg by mouth daily. , Disp: , Rfl:   No Known Allergies  Review of Systems  Constitutional: Negative for fever, chills and weight loss.  HENT: Negative for congestion, hearing loss, sore throat and tinnitus.   Eyes:  Negative for blurred vision, double vision and redness.  Respiratory: Negative for cough, hemoptysis and shortness of breath.   Cardiovascular: Negative for chest pain, palpitations, orthopnea, claudication and leg swelling.  Gastrointestinal: Negative for heartburn, nausea, vomiting, diarrhea, constipation and blood in stool.  Genitourinary: Negative for dysuria, urgency, frequency and hematuria.  Musculoskeletal: Positive for joint pain. Negative for myalgias, back pain, falls and neck  pain.  Skin: Negative for itching.  Neurological: Positive for focal weakness. Negative for dizziness, tingling, tremors, seizures, loss of consciousness, weakness and headaches.  Endo/Heme/Allergies: Does not bruise/bleed easily.  Psychiatric/Behavioral: Positive for depression. Negative for substance abuse. The patient is not nervous/anxious and does not have insomnia.      Objective  Filed Vitals:   10/21/14 0801  BP: 130/80  Pulse: 87  Temp: 97.8 F (36.6 C)  TempSrc: Oral  Resp: 16  Height:  (1.753 m)  Weight: 243 lb 9.6 oz (110.496 kg)  SpO2: 93%     Physical Exam  Constitutional: He is oriented to person, place, and time and well-developed, well-nourished, and in no distress.  HENT:  Head: Normocephalic.  Eyes: EOM are normal. Pupils are equal, round, and reactive to light.  Neck: Normal range of motion. Neck supple. No thyromegaly present.  Cardiovascular: Normal rate, regular rhythm and normal heart sounds.   No murmur heard. Pulmonary/Chest: Effort normal and breath sounds normal. No respiratory distress. He has no wheezes.  Abdominal: Soft. Bowel sounds are normal.  Musculoskeletal: Normal range of motion. He exhibits no edema.  Lymphadenopathy:    He has no cervical adenopathy.  Neurological: He is alert and oriented to person, place, and time. No cranial nerve deficit. Coordination normal.  Mild left-sided weakness and mild gait abnormality is noted as well  Skin: Skin is warm and dry. No rash noted.  Psychiatric: Affect and judgment normal.      Assessment & Plan   1. Thalamic infarct, acute Gradually improving  2. Left hemiparesis Continues to improve  3. Essential hypertension Well-controlled and out - Comprehensive metabolic panel  4. Obesity Low-fat diet  5. CKD (chronic kidney disease) stage 3, GFR 30-59 ml/min Repeat lab - Comprehensive metabolic panel

## 2014-10-21 NOTE — Patient Instructions (Signed)
Patient has follow-up scheduled with his neurologist next week

## 2014-10-21 NOTE — Telephone Encounter (Signed)
Electronic request to refill pt's Plavix Rx.Marland KitchenMarland KitchenMarland KitchenMarland Kitchenpt has hospital follow up on July 25th, 2016...Marland KitchenMarland Kitchenrefill?

## 2014-10-22 LAB — COMPREHENSIVE METABOLIC PANEL
ALT: 14 IU/L (ref 0–44)
AST: 11 IU/L (ref 0–40)
Albumin/Globulin Ratio: 1.6 (ref 1.1–2.5)
Albumin: 4.2 g/dL (ref 3.5–4.7)
Alkaline Phosphatase: 58 IU/L (ref 39–117)
BILIRUBIN TOTAL: 0.7 mg/dL (ref 0.0–1.2)
BUN/Creatinine Ratio: 8 — ABNORMAL LOW (ref 10–22)
BUN: 11 mg/dL (ref 8–27)
CALCIUM: 9.4 mg/dL (ref 8.6–10.2)
CO2: 22 mmol/L (ref 18–29)
CREATININE: 1.3 mg/dL — AB (ref 0.76–1.27)
Chloride: 101 mmol/L (ref 97–108)
GFR calc Af Amer: 58 mL/min/{1.73_m2} — ABNORMAL LOW (ref >59)
GFR, EST NON AFRICAN AMERICAN: 50 mL/min/{1.73_m2} — AB (ref >59)
GLUCOSE: 90 mg/dL (ref 65–99)
Globulin, Total: 2.6 g/dL (ref 1.5–4.5)
POTASSIUM: 4.2 mmol/L (ref 3.5–5.2)
SODIUM: 140 mmol/L (ref 134–144)
Total Protein: 6.8 g/dL (ref 6.0–8.5)

## 2014-10-25 ENCOUNTER — Encounter: Payer: Self-pay | Admitting: Physical Medicine & Rehabilitation

## 2014-10-25 ENCOUNTER — Encounter
Payer: Commercial Managed Care - HMO | Attending: Physical Medicine & Rehabilitation | Admitting: Physical Medicine & Rehabilitation

## 2014-10-25 ENCOUNTER — Other Ambulatory Visit: Payer: Self-pay | Admitting: *Deleted

## 2014-10-25 ENCOUNTER — Other Ambulatory Visit: Payer: Self-pay | Admitting: Physical Medicine and Rehabilitation

## 2014-10-25 VITALS — BP 149/77 | HR 69 | Resp 16

## 2014-10-25 DIAGNOSIS — G8190 Hemiplegia, unspecified affecting unspecified side: Secondary | ICD-10-CM

## 2014-10-25 DIAGNOSIS — I639 Cerebral infarction, unspecified: Secondary | ICD-10-CM

## 2014-10-25 DIAGNOSIS — I69898 Other sequelae of other cerebrovascular disease: Secondary | ICD-10-CM | POA: Insufficient documentation

## 2014-10-25 DIAGNOSIS — I10 Essential (primary) hypertension: Secondary | ICD-10-CM | POA: Insufficient documentation

## 2014-10-25 DIAGNOSIS — E780 Pure hypercholesterolemia: Secondary | ICD-10-CM | POA: Insufficient documentation

## 2014-10-25 DIAGNOSIS — G8194 Hemiplegia, unspecified affecting left nondominant side: Secondary | ICD-10-CM

## 2014-10-25 DIAGNOSIS — I6381 Other cerebral infarction due to occlusion or stenosis of small artery: Secondary | ICD-10-CM

## 2014-10-25 NOTE — Patient Instructions (Signed)
CONTINUE TO STAY ACTIVE.  BE SMART AND REALISTIC ABOUT YOUR ACTIVITIES!!!   USE YOUR CANE AND GOOD SHOES WHEN YOU ARE IN YOUR YARD/OUTSIDE!!!

## 2014-10-25 NOTE — Progress Notes (Signed)
Subjective:    Patient ID: Jeffery Campbell, male    DOB: 1929-01-30, 81 y.o.   MRN: [id number]  HPI   Jeffery Campbell is here in follow up of his CVA. He is still in outpatient therapy. Is in outpatient therapies currently working on higher level balance and strength. Denies no pain. He's sleeping well. Appetite is good.   He likes to golf, get out of the house and stay active.   He has been driving.   Pain Inventory Average Pain 0 Pain Right Now 0 My pain is no pain  In the last 24 hours, has pain interfered with the following? General activity 0 Relation with others 0 Enjoyment of life 0 What TIME of day is your pain at its worst? no pain Sleep (in general) Good  Pain is worse with: no pain Pain improves with: no pain Relief from Meds: no pain  Mobility walk without assistance ability to climb steps?  yes do you drive?  yes  Function retired  Neuro/Psych No problems in this area  Prior Studies Any changes since last visit?  no  Physicians involved in your care Any changes since last visit?  no   Family History  Problem Relation Age of Onset  . Hypertension Son    History   Social History  . Marital Status: Divorced    Spouse Name: N/A  . Number of Children: N/A  . Years of Education: N/A   Social History Main Topics  . Smoking status: Never Smoker   . Smokeless tobacco: Not on file  . Alcohol Use: No  . Drug Use: No  . Sexual Activity: Not Currently   Other Topics Concern  . None   Social History Narrative   Past Surgical History  Procedure Laterality Date  . Transurethral resection of prostate    . Knee arthroscopy    . Colonoscopy     Past Medical History  Diagnosis Date  . Diverticulitis   . Hypertension   . Pure hypercholesterolemia   . Hemorrhoids   . OA (osteoarthritis)   . Disturbance of sleep   . Stroke    BP 149/77 mmHg  Pulse 69  Resp 16  SpO2 97%  Opioid Risk Score:   Fall Risk Score:  `1  Depression screen PHQ  2/9  Depression screen Jeffery Campbell 2/9 10/25/2014 10/25/2014 09/20/2014  Decreased Interest 0 0 0  Down, Depressed, Hopeless 0 0 0  PHQ - 2 Score 0 0 0  Altered sleeping 0 - -  Tired, decreased energy 0 - -  Change in appetite 0 - -  Feeling bad or failure about yourself  0 - -  Trouble concentrating 0 - -  Moving slowly or fidgety/restless 0 - -  Suicidal thoughts 0 - -  PHQ-9 Score 0 - -    Review of Systems  All other systems reviewed and are negative.      Objective:   Physical Exam  Constitutional: He is oriented to person, place, and time. He appears well-developed and well-nourished.  HENT: oral mucosa appears dry Head: Normocephalic and atraumatic.  Eyes: Conjunctivae are normal. Pupils are equal, round, and reactive to light.  Neck: Normal range of motion. Neck supple.  Cardiovascular: Normal rate and regular rhythm. no murmur Respiratory: Effort normal and breath sounds normal. No respiratory distress. Minimal wheezing.  GI: Soft. Bowel sounds are normal. He exhibits no distension. There is no tenderness.  Musculoskeletal: He exhibits no edema or tenderness.  Neurological: He is  alert and oriented to person, place, and time. Normal insight and awareness. He has normal reflexes.  Left central 7 and tongue resolved. Speech clear. Left upper and left lower limb ataxia improved---mild left central 7. Motor: 5/5 left deltoid, bicep, tricep, hi, hip, 4+/5 knee extension, ankle.  Skin: Skin is warm and dry.  Psychiatric: He has a normal mood and affect. His behavior is normal. Judgment and thought content normal.    Assessment/Plan: 1. Functional deficits secondary to right thalamic/PLIC infarct -discussed walking technique, safety measures -should use cane when he's walking outside or if he's walking longer distances.  -talked to him about reasonable and safe in general -he's made great progress!!! 2. HTN: per pcp

## 2014-10-26 ENCOUNTER — Other Ambulatory Visit: Payer: Self-pay | Admitting: Family Medicine

## 2014-10-26 ENCOUNTER — Encounter: Payer: Self-pay | Admitting: Neurology

## 2014-10-26 ENCOUNTER — Telehealth: Payer: Self-pay | Admitting: Emergency Medicine

## 2014-10-26 ENCOUNTER — Ambulatory Visit (INDEPENDENT_AMBULATORY_CARE_PROVIDER_SITE_OTHER): Payer: Commercial Managed Care - HMO | Admitting: Neurology

## 2014-10-26 VITALS — BP 134/89 | HR 50 | Ht 69.0 in | Wt 243.0 lb

## 2014-10-26 DIAGNOSIS — I63339 Cerebral infarction due to thrombosis of unspecified posterior cerebral artery: Secondary | ICD-10-CM | POA: Diagnosis not present

## 2014-10-26 MED ORDER — ASPIRIN-DIPYRIDAMOLE ER 25-200 MG PO CP12: 1.0000 | ORAL_CAPSULE | Freq: Two times a day (BID) | ORAL | Status: DC

## 2014-10-26 NOTE — Telephone Encounter (Signed)
[facility] called regarding Jeffery Campbell. Stated that he has been on Plavik from you but was seen by Dr. Threasa Beards in [place] and was put on Aggrenox.  Which once can be D/c. Spoke to Dr. Bradly Bienenstock while pharmacy was on the phone and he notified them to D/C [name] and stay with the Aggrenox.

## 2014-10-26 NOTE — Progress Notes (Signed)
PATIENT: Jeffery Campbell DOB: [birth year]  Chief Complaint  Patient presents with  . Cerebrovascular Accident    MMSE 24/30 - 11 animals.  He had a stroke in May 2016. He is here with his son, [name] Other.  He is still going to both PT and OT.  He is using a cane to assist with ambulation.    HISTORICAL  Jeffery Campbell is 81 yo RH, accompanied by his son [name] Other, seen in refer by his primary care physician Dr. Dennison Mascot to follow-up hospital discharge for stroke  He had a history of hypertension, hyperlipidemia, noticed sudden onset of left-sided weakness, slurred speech, left leg weakness around 1 AM Aug 19 2014, he has been taking aspirin, and Plavix daily when the event happened  I have personally MRI of brain, acute 15 x 9 mm ischemia lesions involving right lateral thalamus, versus posterior limb of the internal capsule  MRA of the brain: No large vessel disease  Laboratory evaluations LDL 74, A1c 5.6 Carotid Doppler study no significant bilateral internal artery stenosis Echocardiogram: Ejection fraction 55-60%, no significant abnormality.  Patient stated that he has been taking aspirin and Plavix prior to the stroke, he is continue taking aspirin 81 mg, and Plavix 75 mg daily, He still has mild left leg weakness, unsteady gait, is receiving physical therapy, has made marked progress  REVIEW OF SYSTEMS: Full 14 system review of systems performed and notable only for gait difficulty  ALLERGIES: No Known Allergies  HOME MEDICATIONS: Current Outpatient Prescriptions  Medication Sig Dispense Refill  . aspirin EC 81 MG tablet Take 81 mg by mouth daily.    [name] atorvastatin (LIPITOR) 20 MG tablet TAKE 1 TABLET BY MOUTH ONCE A DAY 30 tablet 1  . citalopram (CELEXA) 10 MG tablet TAKE 1 TABLET BY MOUTH DAILY 90 tablet 1  . clopidogrel (PLAVIX) 75 MG tablet Take 1 tablet (75 mg total) by mouth daily. 30 tablet 1  . cyclobenzaprine (FLEXERIL) 5 MG tablet TAKE 1 TABLET BY MOUTH AT BEDTIME  30 tablet 2  . diazepam (VALIUM) 5 MG tablet Take 5 mg by mouth every 8 (eight) hours as needed for anxiety.    [name] diltiazem (CARDIZEM CD) 120 MG 24 hr capsule Take 1 capsule (120 mg total) by mouth daily. 30 capsule 1  . docusate sodium (COLACE) 100 MG capsule Take 1 capsule (100 mg total) by mouth 2 (two) times daily. 60 capsule 0  . lansoprazole (PREVACID) 30 MG capsule Take 30 mg by mouth daily at 12 noon.    . Omega 3 1200 MG CAPS Take by mouth daily.    [name] terazosin (HYTRIN) 5 MG capsule Take 5 mg by mouth at bedtime.    . vitamin B-12 (CYANOCOBALAMIN) 1000 MCG tablet Take 2,000 mcg by mouth daily.        PAST MEDICAL HISTORY: Past Medical History  Diagnosis Date  . Diverticulitis   . Hypertension   . Pure hypercholesterolemia   . Hemorrhoids   . OA (osteoarthritis)   . Disturbance of sleep   . Stroke   . Arrhythmia     PAST SURGICAL HISTORY: Past Surgical History  Procedure Laterality Date  . Transurethral resection of prostate    . Knee arthroscopy    . Colonoscopy      FAMILY HISTORY: Family History  Problem Relation Age of Onset  . Hypertension Son     Posey Rea of parents health.    SOCIAL HISTORY:  History   Social History  .  Marital Status: Divorced    Spouse Name: N/A  . Number of Children: 2  . Years of Education: 12   Occupational History  . Retired    Social History Main Topics  . Smoking status: Never Smoker   . Smokeless tobacco: Not on file  . Alcohol Use: No  . Drug Use: No  . Sexual Activity: Not Currently   Other Topics Concern  . Not on file   Social History Narrative   Lives at home alone.   Writes left-handed - does everything else with right-hand.   No caffeine use.     PHYSICAL EXAM   Filed Vitals:   10/26/14 0937  BP: 134/89  Pulse: 50  Height:  (1.753 m)  Weight: 243 lb (110.224 kg)    Not recorded      Body mass index is 35.87 kg/(m^2).  PHYSICAL EXAMNIATION:  Gen: NAD, conversant, well nourised,  obese, well groomed                     Cardiovascular: Regular rate rhythm, no peripheral edema, warm, nontender. Eyes: Conjunctivae clear without exudates or hemorrhage Neck: Supple, no carotid bruise. Pulmonary: Clear to auscultation bilaterally   NEUROLOGICAL EXAM:  MENTAL STATUS: Speech:    Speech is normal; fluent and spontaneous with normal comprehension.  Cognition: Mini-Mental Status Examination is 24 out of 30, he is not oriented to date, place, missed 2 out of 3 recalls, has difficulties world backwards  CRANIAL NERVES: CN II: Visual fields are full to confrontation. Fundoscopic exam is normal with sharp discs, pupils were equal round reactive to light  CN III, IV, VI: extraocular movement are normal. No ptosis. CN V: Facial sensation is intact to pinprick in all 3 divisions bilaterally. Corneal responses are intact.  CN WJX:BJYN left lower face weakness IX, X: Palate elevates symmetrically. Phonation is normal. CN XI: Head turning and shoulder shrug are intact CN XII: Tongue is midline with normal movements and no atrophy.  MOTOR: He has mild left arm pronation drift, mild weak left hand grip, mild spasticity of left lower extremity, mild left hip flexion weakness   REFLEXES: Reflexes are 2+ and symmetric at the biceps, triceps, knees, and ankles. Plantar responses are flexor.  SENSORY: Light touch, pinprick, position sense, and vibration sense are intact in fingers and toes.  COORDINATION: Rapid alternating movements and fine finger movements are intact. There is no dysmetria on finger-to-nose and heel-knee-shin. There are no abnormal or extraneous movements.   GAIT/STANCE: Left circumferential gait, dragging his left leg some,    DIAGNOSTIC DATA (LABS, IMAGING, TESTING) - I reviewed patient records, labs, notes, testing and imaging myself where available.  Lab Results  Component Value Date   WBC 7.9 08/24/2014   HGB 17.3* 08/24/2014   HCT 48.9 08/24/2014    MCV 89.9 08/24/2014   PLT 181 08/24/2014      Component Value Date/Time   NA 140 10/21/2014 0914   NA 140 08/30/2014 0834   K 4.2 10/21/2014 0914   CL 101 10/21/2014 0914   CO2 22 10/21/2014 0914   GLUCOSE 90 10/21/2014 0914   GLUCOSE 134* 08/30/2014 0834   BUN 11 10/21/2014 0914   BUN 23* 08/30/2014 0834   CREATININE 1.30* 10/21/2014 0914   CALCIUM 9.4 10/21/2014 0914   PROT 6.8 10/21/2014 0914   PROT 6.3* 08/24/2014 0620   ALBUMIN 3.5 08/24/2014 0620   AST 11 10/21/2014 0914   ALT 14 10/21/2014 0914  ALKPHOS 58 10/21/2014 0914   BILITOT 0.7 10/21/2014 0914   BILITOT 1.7* 08/24/2014 0620   GFRNONAA 50* 10/21/2014 0914   GFRAA 58* 10/21/2014 0914   Lab Results  Component Value Date   CHOL 134 09/22/2014   HDL 46 09/22/2014   LDLCALC 73 09/22/2014   TRIG 76 09/22/2014   CHOLHDL 2.9 09/22/2014   Lab Results  Component Value Date   HGBA1C 5.6 08/19/2014   No results found for: VITAMINB12 Lab Results  Component Value Date   TSH 4.020 09/22/2014      ASSESSMENT AND PLAN  Dshaun Anschutz is a 81 y.o. male   with vascular risk factor of hypertension, hyperlipidemia, aging, presenting with small vessel stroke involving right lateral thalamus versus posterior limb of internal capsule with residual spastic left hemiparesis   1. he had a stroke while taking aspirin and Plavix, will switch him to Aggrenox 1 tablet twice a day 2. Optimize blood pressure hyperlipidemia control, 3. Continue moderate exercise   Levert Feinstein, M.D. Ph.D.  [facility] [address] [place], [place] [address] Ph: [phone] Fax: [phone]

## 2014-10-27 ENCOUNTER — Ambulatory Visit: Payer: Commercial Managed Care - HMO

## 2014-10-27 DIAGNOSIS — R2681 Unsteadiness on feet: Secondary | ICD-10-CM | POA: Diagnosis not present

## 2014-10-27 NOTE — Therapy (Signed)
[facility] MAIN [facility] SERVICES [address] [place], [place], [address] Phone: [phone]   Fax:  [phone]  Physical Therapy Treatment  Patient Details  Name: Jeffery Campbell MRN: [id number] Date of Birth: November 23, 1928 Referring Provider:  Dennison Mascot, MD  Encounter Date: 10/27/2014      PT End of Session - 10/27/14 1218    Visit Number 5   Number of Visits 9   Date for PT Re-Evaluation 11/03/14   Authorization Type 5/10   PT Start Time 1046   PT Stop Time 1130   PT Time Calculation (min) 44 min   Equipment Utilized During Treatment Gait belt   Activity Tolerance Patient tolerated treatment well   Behavior During Therapy [facility] for tasks assessed/performed      Past Medical History  Diagnosis Date  . Diverticulitis   . Hypertension   . Pure hypercholesterolemia   . Hemorrhoids   . OA (osteoarthritis)   . Disturbance of sleep   . Stroke   . Arrhythmia     Past Surgical History  Procedure Laterality Date  . Transurethral resection of prostate    . Knee arthroscopy    . Colonoscopy      There were no vitals filed for this visit.  Visit Diagnosis:  Unsteadiness on feet      Subjective Assessment - 10/27/14 1217    Subjective pt relates he saw his MD yesterday and says he is doing well and will not need a follow-up visit.     Patient Stated Goals improve confidence with walking and improve walking ability    Currently in Pain? No/denies   Pain Score 0-No pain         there ex: Bilateral leg press with 90#, 1x10 Left leg press with 90# 2x10, with emphasis on explosive concentric and slow eccentric phases  Hip abduction/extension with red band x10 Pt required verbal cues to perform leg press at slower pace to improve LE control   Neuro re-ed: Side stepping on 4 inch followed by forward step in 6 inch step followed by mini squat on airex x 5 Forward waling while turning left/right 2x22 ft Forward walking while  turning left/right and forward press with yellow ball (2000) 2x 22 ft Bilateral Forward step with mini lunge/side step x 10 each. Pt required verbal cueing on correct form and to focus on clearing the left foot  Forward arching  4 x 22 ft emphasis on lifting L LE with CGA  Side stepping 4x 22 ft with CGA  Pt required min-mod verbal and tactile cues to maintain upright posture, look forward versus down, and to focus on clearing L foot versus dragging   Pre session BP: 117/71 and 60 bpm                             PT Education - 10/27/14 1218    Education provided Yes   Education Details to continue walking to improve functional activity tolerance and gait   Person(s) Educated Patient   Methods Explanation   Comprehension Verbalized understanding             PT Long Term Goals - 10/06/14 1240    PT LONG TERM GOAL #1   Title pt will ambulate with a gait velocity of at 0.8 m/s without an assistive device in order ambulate in the community   Baseline 0.52 m/s without cane and 0.49 m/s with cane  Time 4   Period Weeks   Status New   PT LONG TERM GOAL #2   Title patient will perform 5x sit to stand in less than 10.6 seconds in order to decrease risk of fall and improve functional strength    Baseline 18.37 seconds without UE assist   Time 4   Period Weeks   Status New   PT LONG TERM GOAL #3   Title pt be able to ambulate safely in unleveld surface x200 ft without assistive device in order return to golfing   Baseline able to ambulate level surface with cane for safety    Time 4   Period Days   Status New               Plan - 10/27/14 1219    Clinical Impression Statement pt is progressing well with PT and demonstrates improved L foot clearing during gait and exercises.  pt experienced 1 LOB and was able self recover.  pt demonstrates decreased functional activity tolerance due to takeing 3-4 rest breaks x2-3 min.  pt would benefit skilled PT  services to improve deficits    Pt will benefit from skilled therapeutic intervention in order to improve on the following deficits Decreased endurance;Decreased activity tolerance;Decreased balance;Decreased mobility   Rehab Potential Good   PT Frequency 2x / week   PT Duration 4 weeks   PT Treatment/Interventions Gait training;Stair training;Neuromuscular re-education;Balance training;Therapeutic exercise;Therapeutic activities;Functional mobility training   PT Next Visit Plan strength and balane activities, active recovery         Problem List Patient Active Problem List   Diagnosis Date Noted  . Back pain, chronic 10/21/2014  . Benign paroxysmal positional nystagmus 10/21/2014  . Decreased libido 10/21/2014  . Clinical depression 10/21/2014  . Fatigue 10/21/2014  . Gastro-esophageal reflux disease without esophagitis 10/21/2014  . Dysmetabolic syndrome 10/21/2014  . Fungal infection of nail 10/21/2014  . Cerebral vascular accident 10/21/2014  . Arrhythmia, ventricular 10/21/2014  . Thalamic infarct, acute   . Left hemiparesis   . CVA (cerebral infarction) 08/19/2014  . Stroke 08/19/2014  . Hypertension 08/19/2014  . HLD (hyperlipidemia) 08/19/2014  . CKD (chronic kidney disease) stage 3, GFR 30-59 ml/min 08/19/2014  . Accelerated hypertension   . 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 Obesity   . Asymptomatic PVCs 05/27/2014  . Bradycardia 04/05/2014  . Essential hypertension 04/05/2014  . Osteoarthrosis involving more than one site but not generalized 08/03/2008  . BP (high blood pressure) 09/27/2006   Janus Molder, SPT This entire session was performed under direct supervision and direction of a licensed therapist/therapist assistant . I have personally read, edited and approve of the note as written. Carlyon Shadow. Tortorici, PT, DPT [phone]  Tortorici,Ashley 10/27/2014, 1:06 PM  [facility] MAIN [facility]  SERVICES [address] [place], [place], [address] Phone: [phone]   Fax:  [phone]

## 2014-10-28 ENCOUNTER — Telehealth: Payer: Self-pay | Admitting: Family Medicine

## 2014-10-28 DIAGNOSIS — E119 Type 2 diabetes mellitus without complications: Secondary | ICD-10-CM

## 2014-10-28 NOTE — Telephone Encounter (Signed)
Patient is requesting that a referral be sent to [facility]. He has appointment scheduled for 11-15-14 @ 1:15p

## 2014-10-29 NOTE — Telephone Encounter (Signed)
Referral has been sent.

## 2014-11-01 ENCOUNTER — Ambulatory Visit: Payer: Commercial Managed Care - HMO | Attending: Family Medicine

## 2014-11-01 DIAGNOSIS — R2681 Unsteadiness on feet: Secondary | ICD-10-CM | POA: Diagnosis present

## 2014-11-01 NOTE — Patient Instructions (Signed)
[email or web address] Hip abduction/extension with red band 2x10

## 2014-11-01 NOTE — Therapy (Signed)
[facility] MAIN [facility] SERVICES [address] [place], [place], [address] Phone: [phone]   Fax:  [phone]  Physical Therapy Treatment  Patient Details  Name: Jeffery Campbell MRN: [id number] Date of Birth: 07-Nov-1928 Referring Provider:  Dennison Mascot, MD  Encounter Date: 11/01/2014      PT End of Session - 11/01/14 1550    Visit Number 6   Number of Visits 9   Date for PT Re-Evaluation 11/03/14   Authorization Type 6/10   PT Start Time 1048   PT Stop Time 1132   PT Time Calculation (min) 44 min   Equipment Utilized During Treatment Gait belt   Activity Tolerance Patient tolerated treatment well   Behavior During Therapy [facility] for tasks assessed/performed      Past Medical History  Diagnosis Date  . Diverticulitis   . Hypertension   . Pure hypercholesterolemia   . Hemorrhoids   . OA (osteoarthritis)   . Disturbance of sleep   . Stroke   . Arrhythmia     Past Surgical History  Procedure Laterality Date  . Transurethral resection of prostate    . Knee arthroscopy    . Colonoscopy      There were no vitals filed for this visit.  Visit Diagnosis:  Unsteadiness on feet      Subjective Assessment - 11/01/14 1547    Subjective pt relates he is doing well and using his cane less when he leaves the house.  pt reports he is walker further and and working on his heel-toe during gait.    Patient Stated Goals improve confidence with walking and improve walking ability    Currently in Pain? No/denies   Pain Score 0-No pain      there ex: Bilateral leg press with 120#, 3x10 Hip abduction/extension/flexion with red band x10 Pt required verbal cues to perform leg press at slower pace to improve LE control and required verbal and tactile cues during hip extension to maintain upright posture versus leaning forward and excessive hip rotation  Gait training: Pt required verbal cueing on heel strike and toe off during forward  ambulation 2x50 ft Pt required verbal cueing on increasing left foot clearance during side stepping x50  Neuro re-ed: Step-up on 4 inch step and 2 inch airex x15 each LE, pt required CGA for safety, and verbal cueing to focus on control versus speed to improve clearing left LE Side-step on 4  inch step and 2 inch airex x10 each LE Forward waling while turning left/right 2x50 ft Forward walking while turning left/right and passing cone behind back to SPTPT 2x 50 ft Forward walking while passing cone over shoulder to SPT 2x50 Pt required verbal cueing on increasing left foot clearance during side stepping x50  Pt required min-mod verbal and tactile cues to maintain upright posture, look forward versus down, and to focus on clearing L foot versus dragging   Pre session BP: 114/64 and 79 bpm  Post session BP: 93/51 and 47 bpm                           PT Education - 11/01/14 1549    Education provided Yes   Education Details progressing with HEP, and reassess next visit   Person(s) Educated Patient   Methods Explanation   Comprehension Verbalized understanding             PT Long Term Goals - 10/06/14 1240  PT LONG TERM GOAL #1   Title pt will ambulate with a gait velocity of at 0.8 m/s without an assistive device in order ambulate in the community   Baseline 0.52 m/s without cane and 0.49 m/s with cane   Time 4   Period Weeks   Status New   PT LONG TERM GOAL #2   Title patient will perform 5x sit to stand in less than 10.6 seconds in order to decrease risk of fall and improve functional strength    Baseline 18.37 seconds without UE assist   Time 4   Period Weeks   Status New   PT LONG TERM GOAL #3   Title pt be able to ambulate safely in unleveld surface x200 ft without assistive device in order return to golfing   Baseline able to ambulate level surface with cane for safety    Time 4   Period Days   Status New               Plan -  11/01/14 1551    Clinical Impression Statement pt is progressing well with PT is was able to perform ambulation while dual taskting with no LOB.  pt decreases his gait speed while ambulation but is able to perform task without stopping.  pt demonstrates difficulty clearing L foot during certain activities his performance improves with each repetition.    Pt will benefit from skilled therapeutic intervention in order to improve on the following deficits Decreased endurance;Decreased activity tolerance;Decreased balance;Decreased mobility   Rehab Potential Good   PT Frequency 2x / week   PT Duration 4 weeks   PT Treatment/Interventions Gait training;Stair training;Neuromuscular re-education;Balance training;Therapeutic exercise;Therapeutic activities;Functional mobility training   PT Next Visit Plan reassess outcome measures        Problem List Patient Active Problem List   Diagnosis Date Noted  . Back pain, chronic 10/21/2014  . Benign paroxysmal positional nystagmus 10/21/2014  . Decreased libido 10/21/2014  . Clinical depression 10/21/2014  . Fatigue 10/21/2014  . Gastro-esophageal reflux disease without esophagitis 10/21/2014  . Dysmetabolic syndrome 10/21/2014  . Fungal infection of nail 10/21/2014  . Cerebral vascular accident 10/21/2014  . Arrhythmia, ventricular 10/21/2014  . Thalamic infarct, acute   . Left hemiparesis   . CVA (cerebral infarction) 08/19/2014  . Stroke 08/19/2014  . Hypertension 08/19/2014  . HLD (hyperlipidemia) 08/19/2014  . CKD (chronic kidney disease) stage 3, GFR 30-59 ml/min 08/19/2014  . Accelerated hypertension   . 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 Obesity   . Asymptomatic PVCs 05/27/2014  . Bradycardia 04/05/2014  . Essential hypertension 04/05/2014  . Osteoarthrosis involving more than one site but not generalized 08/03/2008  . BP (high blood pressure) 09/27/2006   Janus Molder, SPT This entire session was  performed under direct supervision and direction of a licensed therapist/therapist assistant . I have personally read, edited and approve of the note as written. Carlyon Shadow. Tortorici, PT, DPT [phone]  Tortorici,Ashley 11/02/2014, 8:47 AM  [facility] MAIN [facility] SERVICES [address] [place], [place], [address] Phone: [phone]   Fax:  [phone]

## 2014-11-03 ENCOUNTER — Other Ambulatory Visit: Payer: Self-pay | Admitting: Emergency Medicine

## 2014-11-03 ENCOUNTER — Ambulatory Visit: Payer: Commercial Managed Care - HMO

## 2014-11-03 DIAGNOSIS — R2681 Unsteadiness on feet: Secondary | ICD-10-CM

## 2014-11-03 MED ORDER — ASPIRIN-DIPYRIDAMOLE ER 25-200 MG PO CP12: 1.0000 | ORAL_CAPSULE | Freq: Two times a day (BID) | ORAL | Status: DC

## 2014-11-03 NOTE — Therapy (Signed)
[place] MAIN [facility] SERVICES [address] [place], [place], [address] Phone: [phone]   Fax:  [phone]  Physical Therapy Treatment/Progress Note 7/13-11/03/2014  Patient Details  Name: Jeffery Campbell MRN: [id number] Date of Birth: 02-05-1929 Referring Provider:  Ashok Norris, MD  Encounter Date: 11/03/2014      PT End of Session - 11/03/14 1857    Visit Number 7   Number of Visits 9   Date for PT Re-Evaluation 11/10/14   Authorization Type 1/3   PT Start Time 1108   PT Stop Time 1155   PT Time Calculation (min) 47 min   Equipment Utilized During Treatment Gait belt   Activity Tolerance Patient tolerated treatment well   Behavior During Therapy [facility] for tasks assessed/performed      Past Medical History  Diagnosis Date  . Diverticulitis   . Hypertension   . Pure hypercholesterolemia   . Hemorrhoids   . OA (osteoarthritis)   . Disturbance of sleep   . Stroke   . Arrhythmia     Past Surgical History  Procedure Laterality Date  . Transurethral resection of prostate    . Knee arthroscopy    . Colonoscopy      There were no vitals filed for this visit.  Visit Diagnosis:  Unsteadiness on feet - Plan: PT plan of care cert/re-cert      Subjective Assessment - 11/03/14 1856    Subjective pt relates he feels well this morning and notes his balance has improved since the start of PT   Patient Stated Goals improve confidence with walking and improve walking ability    Currently in Pain? No/denies   Pain Score 0-No pain      SPT reassessed outcome measures and progress towards goals therex: 5x sit to stand: 13.84 seconds 10 meter gait speed: 0.81 m/s Berg balance test: 54/56  Gait training: Pt is able to walk 200 ft in unleveled surface with left foot clearance no assistive device, SBA for safety                         PT Education - 11/03/14 1856    Education provided Yes   Education Details  plan of care   Person(s) Educated Patient   Methods Explanation   Comprehension Verbalized understanding             PT Long Term Goals - 11/03/14 1904    PT LONG TERM GOAL #1   Title pt will ambulate with a gait velocity of at 0.8 m/s without an assistive device in order ambulate in the community   Baseline 0.81 m/s with no assistive device    Time 4   Period Weeks   Status Achieved   PT LONG TERM GOAL #2   Title patient will perform 5x sit to stand in less than 10.6 seconds in order to decrease risk of fall and improve functional strength    Baseline 13.84 seconds with no UE assist    Time 4   Period Weeks   Status Partially Met   PT LONG TERM GOAL #3   Title pt be able to ambulate safely in unleveld surface x200 ft without assistive device in order return to golfing   Baseline pt can ambulate safely in unleveled surface x200 ft without assistive device    Time 4   Period Days   Status Achieved   PT LONG TERM GOAL #4   Title pt  will be able to pick up small objects from ground without LOB such as golf ball in order to return to prior level function   Baseline can pick up object 6 inches off the ground    Time 1   Period Weeks   Status New   PT LONG TERM GOAL #5   Title pt will be able to swing golf club wihout LOB to return to prior level of function   Time 1   Period Weeks               Plan - 11/03/14 1900    Clinical Impression Statement pt ambulates with no apparent left foot drag and did not experience any loss of balance during session.  pt is progressing well with PT and has met all his initial goals and  except 5x sit-stand indicating low risk of falls and will benefit from continued skilled PT services to make further gains and work on improving balance while picking up objects off the ground and while using golf club.     Pt will benefit from skilled therapeutic intervention in order to improve on the following deficits Decreased endurance;Decreased  activity tolerance;Decreased balance;Decreased mobility   Rehab Potential Good   PT Frequency 2x / week   PT Duration 4 weeks   PT Treatment/Interventions Gait training;Stair training;Neuromuscular re-education;Balance training;Therapeutic exercise;Therapeutic activities;Functional mobility training   PT Next Visit Plan gait training and practive picking small objects off the ground           G-Codes - 2014-11-13 0802    Functional Assessment Tool Used outcome measures, clinical judgment, history   Functional Limitation Mobility: Walking and moving around   Mobility: Walking and Moving Around Current Status ([id number]) At least 1 percent but less than 20 percent impaired, limited or restricted  ~10%   Mobility: Walking and Moving Around Goal Status [phone]) At least 1 percent but less than 20 percent impaired, limited or restricted      Problem List Patient Active Problem List   Diagnosis Date Noted  . Back pain, chronic 10/21/2014  . Benign paroxysmal positional nystagmus 10/21/2014  . Decreased libido 10/21/2014  . Clinical depression 10/21/2014  . Fatigue 10/21/2014  . Gastro-esophageal reflux disease without esophagitis 10/21/2014  . Dysmetabolic syndrome 30/16/0109  . Fungal infection of nail 10/21/2014  . Cerebral vascular accident 10/21/2014  . Arrhythmia, ventricular 10/21/2014  . Thalamic infarct, acute   . Left hemiparesis   . CVA (cerebral infarction) 08/19/2014  . Stroke 08/19/2014  . Hypertension 08/19/2014  . HLD (hyperlipidemia) 08/19/2014  . CKD (chronic kidney disease) stage 3, GFR 30-59 ml/min 08/19/2014  . Accelerated hypertension   . 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 Obesity   . Asymptomatic PVCs 05/27/2014  . Bradycardia 04/05/2014  . Essential hypertension 04/05/2014  . Osteoarthrosis involving more than one site but not generalized 08/03/2008  . BP (high blood pressure) 09/27/2006   Renford Dills, SPT This entire session  was performed under direct supervision and direction of a licensed therapist/therapist assistant . I have personally read, edited and approve of the note as written. Gorden Harms. Tortorici, PT, DPT [phone]  Tortorici,Ashley 11/13/2014, 10:55 AM  [place] MAIN [facility] SERVICES [address] [place], [place], [address] Phone: [phone]   Fax:  [phone]

## 2014-11-04 DIAGNOSIS — R2681 Unsteadiness on feet: Secondary | ICD-10-CM | POA: Diagnosis not present

## 2014-11-08 ENCOUNTER — Ambulatory Visit: Payer: Commercial Managed Care - HMO

## 2014-11-08 DIAGNOSIS — R2681 Unsteadiness on feet: Secondary | ICD-10-CM | POA: Diagnosis not present

## 2014-11-08 NOTE — Therapy (Signed)
[place] MAIN [facility] SERVICES [address] [place], [place], [address] Phone: [phone]   Fax:  [phone]  Physical Therapy Treatment  Patient Details  Name: Jeffery Campbell MRN: [id number] Date of Birth: 15-Nov-1928 Referring Provider:  Ashok Norris, MD  Encounter Date: 11/08/2014      PT End of Session - 11/08/14 1219    Visit Number 8   Number of Visits 9   Date for PT Re-Evaluation 11/10/14   Authorization Type 2/3   PT Start Time 1045   PT Stop Time 1130   PT Time Calculation (min) 45 min   Equipment Utilized During Treatment Gait belt   Activity Tolerance Patient tolerated treatment well   Behavior During Therapy [facility] for tasks assessed/performed      Past Medical History  Diagnosis Date  . Diverticulitis   . Hypertension   . Pure hypercholesterolemia   . Hemorrhoids   . OA (osteoarthritis)   . Disturbance of sleep   . Stroke   . Arrhythmia     Past Surgical History  Procedure Laterality Date  . Transurethral resection of prostate    . Knee arthroscopy    . Colonoscopy      There were no vitals filed for this visit.  Visit Diagnosis:  Unsteadiness on feet      Subjective Assessment - 11/08/14 1216    Subjective pt relates he is doing well and has been picking up golf balls in his back yard and has started swining golf club. pt reports he has been doing well with swinging but does not feel completely confident.     Patient Stated Goals improve confidence with walking and improve walking ability    Currently in Pain? No/denies   Pain Score 0-No pain      Thera activity: working specifically toward new goals Picking golf balls on leveled surface x 2 min Swinging club on leveled surface x 2 min Picking golf balls on unleveled surface x 2 min Swinging golf club on unleveled surface x3 min Stepping forward on unleveled surface x2 min each LE Side stepping on un leveled surface x 2 min Stepping forward over  obstacle and back x 2 min Sidestepping over obstacle and back x2 min  SBA for safety Pt required verbal cues to weight shift forward when stepping forward and weight shift back when stepping backwards   Following therapeutic activity:   Gait training: Ambulating while dual tasking commands (stop, look left, look right, turn right, turn left, go back wards) x 7 minute on leveled surface, x 7 min on unleveled surface  pt required supervision for safety, pt experienced one LOB that required min assist during turning left on unleveled surface                            PT Education - 11/08/14 1218    Education provided Yes   Education Details pt can practice stepping over clubs at home as long as he has somone close by for supprot/safety.    Person(s) Educated Patient   Methods Explanation   Comprehension Verbalized understanding             PT Long Term Goals - 11/03/14 1904    PT LONG TERM GOAL #1   Title pt will ambulate with a gait velocity of at 0.8 m/s without an assistive device in order ambulate in the community   Baseline 0.81 m/s with no assistive  device    Time 4   Period Weeks   Status Achieved   PT LONG TERM GOAL #2   Title patient will perform 5x sit to stand in less than 10.6 seconds in order to decrease risk of fall and improve functional strength    Baseline 13.84 seconds with no UE assist    Time 4   Period Weeks   Status Partially Met   PT LONG TERM GOAL #3   Title pt be able to ambulate safely in unleveld surface x200 ft without assistive device in order return to golfing   Baseline pt can ambulate safely in unleveled surface x200 ft without assistive device    Time 4   Period Days   Status Achieved   PT LONG TERM GOAL #4   Title pt will be able to pick up small objects from ground without LOB such as golf ball in order to return to prior level function   Baseline can pick up object 6 inches off the ground    Time 1   Period  Weeks   Status New   PT LONG TERM GOAL #5   Title pt will be able to swing golf club wihout LOB to return to prior level of function   Time 1   Period Weeks               Plan - 11/08/14 1221    Clinical Impression Statement pt was able to ambulated outside on uneven surface while dual tasking, picking up small objects, stepping over clubs to simulate conditions pt will encounter.  pt demonstrates difficulty weight shifting un uneven surface and experienced one LOB that required min asssist to recover.     Pt will benefit from skilled therapeutic intervention in order to improve on the following deficits Decreased endurance;Decreased activity tolerance;Decreased balance;Decreased mobility   Rehab Potential Good   PT Frequency 2x / week   PT Duration 4 weeks   PT Treatment/Interventions Gait training;Stair training;Neuromuscular re-education;Balance training;Therapeutic exercise;Therapeutic activities;Functional mobility training   PT Next Visit Plan gait training and practive picking small objects off the ground         Problem List Patient Active Problem List   Diagnosis Date Noted  . Back pain, chronic 10/21/2014  . Benign paroxysmal positional nystagmus 10/21/2014  . Decreased libido 10/21/2014  . Clinical depression 10/21/2014  . Fatigue 10/21/2014  . Gastro-esophageal reflux disease without esophagitis 10/21/2014  . Dysmetabolic syndrome 32/67/1245  . Fungal infection of nail 10/21/2014  . Cerebral vascular accident 10/21/2014  . Arrhythmia, ventricular 10/21/2014  . Thalamic infarct, acute   . Left hemiparesis   . CVA (cerebral infarction) 08/19/2014  . Stroke 08/19/2014  . Hypertension 08/19/2014  . HLD (hyperlipidemia) 08/19/2014  . CKD (chronic kidney disease) stage 3, GFR 30-59 ml/min 08/19/2014  . Accelerated hypertension   . 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 Obesity   . Asymptomatic PVCs 05/27/2014  . Bradycardia  04/05/2014  . Essential hypertension 04/05/2014  . Osteoarthrosis involving more than one site but not generalized 08/03/2008  . BP (high blood pressure) 09/27/2006   Renford Dills, SPT This entire session was performed under direct supervision and direction of a licensed therapist/therapist assistant . I have personally read, edited and approve of the note as written. Gorden Harms. Tortorici, PT, DPT [phone]  Tortorici,Ashley 11/08/2014, 3:41 PM  [place] MAIN [facility] SERVICES [address] [place], [place], [address] Phone:  [phone]   Fax:  [phone]

## 2014-11-10 ENCOUNTER — Ambulatory Visit: Payer: Commercial Managed Care - HMO

## 2014-11-10 DIAGNOSIS — R2681 Unsteadiness on feet: Secondary | ICD-10-CM

## 2014-11-10 NOTE — Therapy (Addendum)
[place] MAIN [facility] SERVICES [address] [place], [place], [address] Phone: [phone]   Fax:  [phone]  Physical Therapy Treatment/Discharge Note 8/03-01/2015   Patient Details   Name: Jeffery Campbell MRN: [id number] Date of Birth: [birth year] Referring Provider:  Ashok Norris, MD  Encounter Date: 11/10/2014      PT End of Session - 11/10/14 1747    Visit Number 9   Number of Visits 9   Date for PT Re-Evaluation 11/10/14   Authorization Type 3/3   PT Start Time 1050   PT Stop Time 1130   PT Time Calculation (min) 40 min   Equipment Utilized During Treatment Gait belt   Activity Tolerance Patient tolerated treatment well   Behavior During Therapy [facility] for tasks assessed/performed      Past Medical History  Diagnosis Date  . Diverticulitis   . Hypertension   . Pure hypercholesterolemia   . Hemorrhoids   . OA (osteoarthritis)   . Disturbance of sleep   . Stroke   . Arrhythmia     Past Surgical History  Procedure Laterality Date  . Transurethral resection of prostate    . Knee arthroscopy    . Colonoscopy      There were no vitals filed for this visit.  Visit Diagnosis:  Unsteadiness on feet      Subjective Assessment - 11/10/14 1744    Subjective pt reports he has been walking more lately and has not experiened any loss of balance.  pt relates he would like to try to walk the treadmill today.     Patient Stated Goals improve confidence with walking and improve walking ability    Currently in Pain? No/denies      Thera activity:  Stepping forward on unleveled surface (red matt with objects underneath) x3 min Side stepping on un leveled  Surfac(red matt with objects underneath) x40mn Stepping forward on unleveled surface (red matt with objects underneath) x3 min with eyes closed  Side stepping on un leveled  Surfac(red matt with objects underneath)x 149m with eyes closed SBA for safety and 3 episodes  requireing min A during eyes closed activities to recover from loss of balance Pt required verbal cues to weight shift forward when stepping forward and weight shift back when stepping backwards  Pt required 3 rest breaks ~ 2 min   Following therapeutic activity:   Gait training: Ambulating on treadmill x 4 min, pt required verbal cues on how safely use treadmill and to step off Ambulating over obstacle course (weaving between cones, stepping up/down 4 inch step, over half foam roll, unleveled surface (red matt with objects underneath) x 6 min\ Pt required rest break ~4 min  Vitals: 121/71 with heart rate of 71 post treatment                           PT Education - 11/10/14 1745    Education provided Yes   Education Details plan of care, how to safely use and step down a treadmill   Person(s) Educated Patient   Methods Explanation;Demonstration   Comprehension Verbalized understanding;Returned demonstration             PT Long Term Goals - 11/10/14 1752    PT LONG TERM GOAL #1   Title pt will ambulate with a gait velocity of at 0.8 m/s without an assistive device in order ambulate in the community   Baseline 0.81 m/s with no  assistive device    Time 4   Period Weeks   Status Achieved   PT LONG TERM GOAL #2   Title patient will perform 5x sit to stand in less than 10.6 seconds in order to decrease risk of fall and improve functional strength    Baseline 13.84 seconds with no UE assist    Time 4   Period Weeks   Status Partially Met   PT LONG TERM GOAL #3   Title pt be able to ambulate safely in unleveld surface x200 ft without assistive device in order return to golfing   Baseline pt can ambulate safely in unleveled surface x200 ft without assistive device    Time 4   Period Days   Status Achieved   PT LONG TERM GOAL #4   Title pt will be able to pick up small objects from ground without LOB such as golf ball in order to return to prior level  function   Baseline can pick up small objects from ground without LOB   Time 1   Period Weeks   Status Achieved   PT LONG TERM GOAL #5   Title pt will be able to swing golf club wihout LOB to return to prior level of function   Baseline pt is able to swing golf club without LOB    Time 1   Period Weeks   Status Achieved               Plan - 11/11/14 1124    Clinical Impression Statement pt will be DC to HEP at this time has he has met PT goals and has returned to PLOF.    Pt will benefit from skilled therapeutic intervention in order to improve on the following deficits Decreased endurance;Decreased activity tolerance;Decreased balance;Decreased mobility   Rehab Potential Good   PT Frequency 2x / week   PT Duration 4 weeks   PT Treatment/Interventions Gait training;Stair training;Neuromuscular re-education;Balance training;Therapeutic exercise;Therapeutic activities;Functional mobility training   PT Next Visit Plan gait training and practive picking small objects off the ground         Problem List Patient Active Problem List   Diagnosis Date Noted  . Back pain, chronic 10/21/2014  . Benign paroxysmal positional nystagmus 10/21/2014  . Decreased libido 10/21/2014  . Clinical depression 10/21/2014  . Fatigue 10/21/2014  . Gastro-esophageal reflux disease without esophagitis 10/21/2014  . Dysmetabolic syndrome 38/33/3832  . Fungal infection of nail 10/21/2014  . Cerebral vascular accident 10/21/2014  . Arrhythmia, ventricular 10/21/2014  . Thalamic infarct, acute   . Left hemiparesis   . CVA (cerebral infarction) 08/19/2014  . Stroke 08/19/2014  . Hypertension 08/19/2014  . HLD (hyperlipidemia) 08/19/2014  . CKD (chronic kidney disease) stage 3, GFR 30-59 ml/min 08/19/2014  . Accelerated hypertension   . 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 Obesity   . Asymptomatic PVCs 05/27/2014  . Bradycardia 04/05/2014  . Essential hypertension  04/05/2014  . Osteoarthrosis involving more than one site but not generalized 08/03/2008  . BP (high blood pressure) 09/27/2006   Renford Dills, SPT  This entire session was performed under direct supervision and direction of a licensed therapist/therapist assistant . I have personally read, edited and approve of the note as written. Gorden Harms. Tortorici, PT, DPT [phone]  Tortorici,Ashley 11/11/2014, 1:03 PM  [place] MAIN [facility] SERVICES [address] [place], [place], [address] Phone: [phone]   Fax:  [phone]

## 2014-11-11 ENCOUNTER — Telehealth: Payer: Self-pay | Admitting: Family Medicine

## 2014-11-11 DIAGNOSIS — R2681 Unsteadiness on feet: Secondary | ICD-10-CM | POA: Diagnosis not present

## 2014-11-11 NOTE — Telephone Encounter (Signed)
Please return pts call

## 2014-11-12 ENCOUNTER — Ambulatory Visit (INDEPENDENT_AMBULATORY_CARE_PROVIDER_SITE_OTHER): Payer: Commercial Managed Care - HMO | Admitting: Family Medicine

## 2014-11-12 ENCOUNTER — Encounter: Payer: Self-pay | Admitting: Family Medicine

## 2014-11-12 VITALS — BP 138/72 | HR 82 | Temp 98.3°F | Resp 16 | Ht 69.0 in | Wt 241.6 lb

## 2014-11-12 DIAGNOSIS — J069 Acute upper respiratory infection, unspecified: Secondary | ICD-10-CM | POA: Diagnosis not present

## 2014-11-12 DIAGNOSIS — J301 Allergic rhinitis due to pollen: Secondary | ICD-10-CM

## 2014-11-12 MED ORDER — PREDNISONE 20 MG PO TABS: 20.0000 mg | ORAL_TABLET | Freq: Every day | ORAL | Status: DC

## 2014-11-12 MED ORDER — FLUTICASONE PROPIONATE 50 MCG/ACT NA SUSP: 2.0000 | Freq: Every day | NASAL | Status: DC

## 2014-11-12 NOTE — Patient Instructions (Signed)

## 2014-11-12 NOTE — Telephone Encounter (Signed)
Spoke to patient. Coming for an appointment for URI

## 2014-11-12 NOTE — Progress Notes (Signed)
Name: Jeffery Campbell   MRN: [id number]    DOB: 07/26/1928   Date:11/12/2014       Progress Note  Subjective  Chief Complaint  Chief Complaint  Patient presents with  . Nasal Congestion    URI  This is a new problem. The current episode started in the past 7 days. The problem has been gradually worsening. There has been no fever. Associated symptoms include congestion, headaches, rhinorrhea, sinus pain, a sore throat and swollen glands. The treatment provided no relief.    Allergic rhinitis  Several day history of nasal congestion and drainage with some mild facial pressure pain of fever chill normal cough and sore throat.   Past Medical History  Diagnosis Date  . Diverticulitis   . Hypertension   . Pure hypercholesterolemia   . Hemorrhoids   . OA (osteoarthritis)   . Disturbance of sleep   . Stroke   . Arrhythmia     Social History  Substance Use Topics  . Smoking status: Never Smoker   . Smokeless tobacco: Not on file  . Alcohol Use: No     Current outpatient prescriptions:  .  atorvastatin (LIPITOR) 20 MG tablet, TAKE 1 TABLET BY MOUTH ONCE A DAY, Disp: 30 tablet, Rfl: 1 .  citalopram (CELEXA) 10 MG tablet, TAKE 1 TABLET BY MOUTH DAILY, Disp: 90 tablet, Rfl: 1 .  cyclobenzaprine (FLEXERIL) 5 MG tablet, TAKE 1 TABLET BY MOUTH AT BEDTIME, Disp: 30 tablet, Rfl: 2 .  diazepam (VALIUM) 5 MG tablet, Take 5 mg by mouth every 8 (eight) hours as needed for anxiety., Disp: , Rfl:  .  diltiazem (CARDIZEM CD) 120 MG 24 hr capsule, Take 1 capsule (120 mg total) by mouth daily., Disp: 30 capsule, Rfl: 1 .  dipyridamole-aspirin (AGGRENOX) 200-25 MG per 12 hr capsule, Take 1 capsule by mouth 2 (two) times daily., Disp: 60 capsule, Rfl: 11 .  docusate sodium (COLACE) 100 MG capsule, Take 1 capsule (100 mg total) by mouth 2 (two) times daily., Disp: 60 capsule, Rfl: 0 .  lansoprazole (PREVACID) 30 MG capsule, Take 30 mg by mouth daily at 12 noon., Disp: , Rfl:  .  Omega 3 1200 MG CAPS,  Take by mouth daily., Disp: , Rfl:  .  terazosin (HYTRIN) 5 MG capsule, Take 5 mg by mouth at bedtime., Disp: , Rfl:  .  vitamin B-12 (CYANOCOBALAMIN) 1000 MCG tablet, Take 2,000 mcg by mouth daily. , Disp: , Rfl:   No Known Allergies  Review of Systems  HENT: Positive for congestion, rhinorrhea and sore throat.   Neurological: Positive for headaches.     Objective  Filed Vitals:   11/12/14 0949  BP: 138/72  Pulse: 82  Temp: 98.3 F (36.8 C)  Resp: 16  Height:  (1.753 m)  Weight: 241 lb 9 oz (109.572 kg)  SpO2: 92%     Physical Exam  Constitutional:  Moderately obese  HENT:  Bilateral nasal turbinate swelling with erythema with clear discharge pharynx not injected  Eyes: Pupils are equal, round, and reactive to light.  Neck: Normal range of motion. Neck supple.  Cardiovascular: Normal rate and regular rhythm.   Pulmonary/Chest: Effort normal and breath sounds normal.  Lymphadenopathy:    He has no cervical adenopathy.      Assessment & Plan  Allergic rhinitis due to pollen  Upper respiratory infection

## 2014-11-19 ENCOUNTER — Telehealth: Payer: Self-pay

## 2014-11-19 NOTE — Telephone Encounter (Signed)
Pt has appointment on Monday to have his teeth cleaned and his dentist said that he needs clearance from you. Pt would like to know if he needs to reschedule this appt. or would it be ok for him to keep it

## 2014-11-22 NOTE — Telephone Encounter (Signed)
OK 

## 2014-12-23 ENCOUNTER — Ambulatory Visit: Payer: Commercial Managed Care - HMO | Admitting: Family Medicine

## 2014-12-27 ENCOUNTER — Ambulatory Visit: Payer: Commercial Managed Care - HMO | Admitting: Family Medicine

## 2014-12-27 ENCOUNTER — Telehealth: Payer: Self-pay | Admitting: Family Medicine

## 2014-12-27 NOTE — Telephone Encounter (Signed)
Patient notified Aggrenox has been ordered from manufacture. It will arrive in 7 to 10 days

## 2014-12-27 NOTE — Telephone Encounter (Signed)
Patient reqeusting return call concerning his medications that you where suppose to order about 2 weeks ago

## 2014-12-29 ENCOUNTER — Ambulatory Visit
Admission: RE | Admit: 2014-12-29 | Discharge: 2014-12-29 | Disposition: A | Discharge status: Home / Self Care | Payer: Commercial Managed Care - HMO | Source: Ambulatory Visit | Attending: Family Medicine | Admitting: Family Medicine

## 2014-12-29 ENCOUNTER — Ambulatory Visit (INDEPENDENT_AMBULATORY_CARE_PROVIDER_SITE_OTHER): Payer: Commercial Managed Care - HMO | Admitting: Family Medicine

## 2014-12-29 ENCOUNTER — Encounter: Payer: Self-pay | Admitting: Family Medicine

## 2014-12-29 VITALS — BP 122/86 | HR 83 | Temp 98.2°F | Resp 18 | Ht 69.0 in | Wt 237.2 lb

## 2014-12-29 DIAGNOSIS — R0602 Shortness of breath: Secondary | ICD-10-CM | POA: Insufficient documentation

## 2014-12-29 DIAGNOSIS — F331 Major depressive disorder, recurrent, moderate: Secondary | ICD-10-CM

## 2014-12-29 DIAGNOSIS — Z23 Encounter for immunization: Secondary | ICD-10-CM | POA: Diagnosis not present

## 2014-12-29 DIAGNOSIS — I69359 Hemiplegia and hemiparesis following cerebral infarction affecting unspecified side: Secondary | ICD-10-CM | POA: Diagnosis not present

## 2014-12-29 DIAGNOSIS — I10 Essential (primary) hypertension: Secondary | ICD-10-CM | POA: Diagnosis not present

## 2014-12-29 DIAGNOSIS — R918 Other nonspecific abnormal finding of lung field: Secondary | ICD-10-CM | POA: Insufficient documentation

## 2014-12-29 MED ORDER — CITALOPRAM HYDROBROMIDE 20 MG PO TABS: 20.0000 mg | ORAL_TABLET | Freq: Every day | ORAL | Status: DC

## 2014-12-29 MED ORDER — PREDNISONE 10 MG (21) PO TBPK: 20.0000 mg | ORAL_TABLET | Freq: Every day | ORAL | Status: DC

## 2014-12-29 MED ORDER — MOMETASONE FURO-FORMOTEROL FUM 100-5 MCG/ACT IN AERO: 1.0000 | INHALATION_SPRAY | Freq: Two times a day (BID) | RESPIRATORY_TRACT | Status: DC

## 2014-12-29 NOTE — Progress Notes (Signed)
Name: Jeffery Campbell   MRN: [id number]    DOB: [birth year]   Date:12/29/2014       Progress Note  Subjective  Chief Complaint  Chief Complaint  Patient presents with  . Depression    mild depression since grandson has moved out of home for 1 week  . Shortness of Breath    for 1 week    HPI  Shortness of breath.  Complaint of dyspnea with exertion over the last several months. He. Quit taking it when he is climbing stairs. There is no chest pain and no palpitations associated. There is no definite wheezing there's been no cough reported. There's been no fever or chills. There's been no hemoptysis or night sweats.   Depression  Patient is felt somewhat depressed since his grandson moved out of his house. He is now living alone and is often phlegm only. He describes being more anxious and depressed currently. No crying spells currently. Is note that he has had a medical complication of his CVA with mild left hematemesis. Before this time he was active playing golf and working on the golf course on a regular basis or much of his interactions occurred  CVA with old hemiparesis  Patient has a mild left hemiparesis since his CVA. Speech and cognition are basically back to baseline but he does suffer some mild depression  Past Medical History  Diagnosis Date  . Diverticulitis   . Hypertension   . Pure hypercholesterolemia   . Hemorrhoids   . OA (osteoarthritis)   . Disturbance of sleep   . Stroke   . Arrhythmia   . Depression     Social History  Substance Use Topics  . Smoking status: Never Smoker   . Smokeless tobacco: Not on file  . Alcohol Use: No     Current outpatient prescriptions:  .  atorvastatin (LIPITOR) 20 MG tablet, TAKE 1 TABLET BY MOUTH ONCE A DAY, Disp: 30 tablet, Rfl: 1 .  citalopram (CELEXA) 10 MG tablet, TAKE 1 TABLET BY MOUTH DAILY, Disp: 90 tablet, Rfl: 1 .  cyclobenzaprine (FLEXERIL) 5 MG tablet, TAKE 1 TABLET BY MOUTH AT BEDTIME, Disp: 30 tablet, Rfl:  2 .  diazepam (VALIUM) 5 MG tablet, Take 5 mg by mouth every 8 (eight) hours as needed for anxiety., Disp: , Rfl:  .  diltiazem (CARDIZEM CD) 120 MG 24 hr capsule, Take 1 capsule (120 mg total) by mouth daily., Disp: 30 capsule, Rfl: 1 .  dipyridamole-aspirin (AGGRENOX) 200-25 MG per 12 hr capsule, Take 1 capsule by mouth 2 (two) times daily., Disp: 60 capsule, Rfl: 11 .  docusate sodium (COLACE) 100 MG capsule, Take 1 capsule (100 mg total) by mouth 2 (two) times daily., Disp: 60 capsule, Rfl: 0 .  fluticasone (FLONASE) 50 MCG/ACT nasal spray, Place 2 sprays into both nostrils daily., Disp: 16 g, Rfl: 6 .  lansoprazole (PREVACID) 30 MG capsule, Take 30 mg by mouth daily at 12 noon., Disp: , Rfl:  .  Omega 3 1200 MG CAPS, Take by mouth daily., Disp: , Rfl:  .  terazosin (HYTRIN) 5 MG capsule, Take 5 mg by mouth at bedtime., Disp: , Rfl:  .  vitamin B-12 (CYANOCOBALAMIN) 1000 MCG tablet, Take 2,000 mcg by mouth daily. , Disp: , Rfl:   No Known Allergies  Review of Systems  Constitutional: Negative for fever, chills and weight loss.  HENT: Positive for congestion. Negative for hearing loss, sore throat and tinnitus.   Eyes: Negative for blurred vision,  double vision and redness.  Respiratory: Positive for cough and shortness of breath. Negative for hemoptysis.   Cardiovascular: Negative for chest pain, palpitations, orthopnea, claudication and leg swelling.  Gastrointestinal: Negative for heartburn, nausea, vomiting, diarrhea, constipation and blood in stool.  Genitourinary: Negative for dysuria, urgency, frequency and hematuria.  Musculoskeletal: Positive for joint pain. Negative for myalgias, back pain, falls and neck pain.  Skin: Negative for itching.  Neurological: Positive for focal weakness (left-sided  weakness since his CVA) and weakness. Negative for dizziness, tingling, tremors, seizures, loss of consciousness and headaches.  Endo/Heme/Allergies: Does not bruise/bleed easily.   Psychiatric/Behavioral: Positive for depression. Negative for substance abuse. The patient is nervous/anxious. The patient does not have insomnia.      Objective  Filed Vitals:   12/29/14 1012  BP: 122/86  Pulse: 83  Temp: 98.2 F (36.8 C)  TempSrc: Oral  Resp: 18  Height:  (1.753 m)  Weight: 237 lb 3.2 oz (107.593 kg)  SpO2: 95%     Physical Exam  Constitutional: He is well-developed, well-nourished, and in no distress.  HENT:  Head: Normocephalic.  Eyes: EOM are normal. Pupils are equal, round, and reactive to light.  Neck: Normal range of motion. Neck supple. No thyromegaly present.  Cardiovascular: Normal rate, regular rhythm and normal heart sounds.   No murmur heard. Pulmonary/Chest: Effort normal and breath sounds normal. No respiratory distress. He has no wheezes.  Musculoskeletal: Normal range of motion. He exhibits no edema.  Lymphadenopathy:    He has no cervical adenopathy.  Neurological: No cranial nerve deficit. Coordination normal.  Antalgic gait. There is 5 minus over 5 strength left upper extremity compared 5 over 5 on the right.  Skin: Skin is warm and dry. No rash noted.  Psychiatric: Judgment normal.  Flat consistent with depression      Assessment & Plan   1. Major depressive disorder, recurrent episode, moderate Increase citalopram from 10-20 mg daily - CBC - TSH  2. Need for influenza vaccination Given - Flu vaccine HIGH DOSE PF (Fluzone High dose)  3. CVA, old, hemiparesis Stable  - CBC - Comprehensive Metabolic Panel (CMET) - TSH  4. Essential hypertension Well-controlled - CBC - Comprehensive Metabolic Panel (CMET) - TSH  5. Shortness of breath  - EKG 12-Lead - D- EKG 12-LeadG Chest 2 View; Future - Spirometry: Pre & Post Eval - predniSONE (STERAPRED UNI-PAK 21 TAB) 10 MG (21) TBPK tablet; Take 2 tablets (20 mg total) by mouth daily.  Dispense: 10 tablet; Refill: 0 - Echocardiogram; Future

## 2014-12-30 LAB — COMPREHENSIVE METABOLIC PANEL
A/G RATIO: 1.8 (ref 1.1–2.5)
ALT: 13 IU/L (ref 0–44)
AST: 12 IU/L (ref 0–40)
Albumin: 4.4 g/dL (ref 3.5–4.7)
Alkaline Phosphatase: 45 IU/L (ref 39–117)
BILIRUBIN TOTAL: 1.2 mg/dL (ref 0.0–1.2)
BUN / CREAT RATIO: 12 (ref 10–22)
BUN: 15 mg/dL (ref 8–27)
CHLORIDE: 100 mmol/L (ref 97–108)
CO2: 23 mmol/L (ref 18–29)
Calcium: 9.6 mg/dL (ref 8.6–10.2)
Creatinine, Ser: 1.26 mg/dL (ref 0.76–1.27)
GFR calc non Af Amer: 52 mL/min/{1.73_m2} — ABNORMAL LOW (ref >59)
GFR, EST AFRICAN AMERICAN: 61 mL/min/{1.73_m2} (ref >59)
GLOBULIN, TOTAL: 2.4 g/dL (ref 1.5–4.5)
Glucose: 98 mg/dL (ref 65–99)
POTASSIUM: 4.1 mmol/L (ref 3.5–5.2)
SODIUM: 140 mmol/L (ref 134–144)
TOTAL PROTEIN: 6.8 g/dL (ref 6.0–8.5)

## 2014-12-30 LAB — CBC
HEMATOCRIT: 49.6 % (ref 37.5–51.0)
Hemoglobin: 17 g/dL (ref 12.6–17.7)
MCH: 31.5 pg (ref 26.6–33.0)
MCHC: 34.3 g/dL (ref 31.5–35.7)
MCV: 92 fL (ref 79–97)
PLATELETS: 257 10*3/uL (ref 150–379)
RBC: 5.4 x10E6/uL (ref 4.14–5.80)
RDW: 14.6 % (ref 12.3–15.4)
WBC: 7.4 10*3/uL (ref 3.4–10.8)

## 2014-12-30 LAB — TSH: TSH: 2.09 u[IU]/mL (ref 0.450–4.500)

## 2015-01-03 ENCOUNTER — Telehealth: Payer: Self-pay | Admitting: Family Medicine

## 2015-01-03 NOTE — Telephone Encounter (Signed)
Spoke with pt and informed him to try OTC medications but if symptoms persist for more than a week then to call back and make an appointment.

## 2015-01-03 NOTE — Telephone Encounter (Signed)
Pt is having cold symptoms. Stuffiness. [facility].

## 2015-01-04 ENCOUNTER — Telehealth: Payer: Self-pay | Admitting: Emergency Medicine

## 2015-01-04 ENCOUNTER — Ambulatory Visit: Payer: Commercial Managed Care - HMO

## 2015-01-04 NOTE — Telephone Encounter (Signed)
Patient notified of labs.   

## 2015-01-04 NOTE — Telephone Encounter (Signed)
Patient notified

## 2015-01-07 ENCOUNTER — Ambulatory Visit
Admission: RE | Admit: 2015-01-07 | Discharge: 2015-01-07 | Disposition: A | Discharge status: Home / Self Care | Payer: Commercial Managed Care - HMO | Source: Ambulatory Visit | Attending: Family Medicine | Admitting: Family Medicine

## 2015-01-07 DIAGNOSIS — R0602 Shortness of breath: Secondary | ICD-10-CM | POA: Insufficient documentation

## 2015-01-07 NOTE — Progress Notes (Signed)
*  PRELIMINARY RESULTS* Echocardiogram 2D Echocardiogram has been performed.  [name] 01/07/2015, 10:33 AM

## 2015-01-14 ENCOUNTER — Telehealth: Payer: Self-pay | Admitting: Emergency Medicine

## 2015-01-14 NOTE — Telephone Encounter (Signed)
Patient notified

## 2015-01-14 NOTE — Telephone Encounter (Signed)
Patient notified of ECHO results 

## 2015-01-17 ENCOUNTER — Other Ambulatory Visit: Payer: Self-pay | Admitting: Emergency Medicine

## 2015-01-17 MED ORDER — CITALOPRAM HYDROBROMIDE 20 MG PO TABS: 20.0000 mg | ORAL_TABLET | Freq: Every day | ORAL | Status: DC

## 2015-01-19 ENCOUNTER — Ambulatory Visit: Payer: Commercial Managed Care - HMO | Admitting: Family Medicine

## 2015-01-20 ENCOUNTER — Ambulatory Visit (INDEPENDENT_AMBULATORY_CARE_PROVIDER_SITE_OTHER): Payer: Commercial Managed Care - HMO | Admitting: Family Medicine

## 2015-01-20 ENCOUNTER — Other Ambulatory Visit: Payer: Self-pay

## 2015-01-20 ENCOUNTER — Encounter: Payer: Self-pay | Admitting: Family Medicine

## 2015-01-20 VITALS — BP 132/68 | HR 74 | Temp 98.6°F | Resp 18 | Ht 69.0 in | Wt 236.0 lb

## 2015-01-20 DIAGNOSIS — F329 Major depressive disorder, single episode, unspecified: Secondary | ICD-10-CM

## 2015-01-20 DIAGNOSIS — J301 Allergic rhinitis due to pollen: Secondary | ICD-10-CM

## 2015-01-20 DIAGNOSIS — H1013 Acute atopic conjunctivitis, bilateral: Secondary | ICD-10-CM | POA: Diagnosis not present

## 2015-01-20 DIAGNOSIS — F32A Depression, unspecified: Secondary | ICD-10-CM

## 2015-01-20 DIAGNOSIS — I10 Essential (primary) hypertension: Secondary | ICD-10-CM | POA: Diagnosis not present

## 2015-01-20 MED ORDER — PREDNISOLONE ACETATE 0.12 % OP SUSP: 1.0000 [drp] | Freq: Four times a day (QID) | OPHTHALMIC | Status: DC

## 2015-01-20 NOTE — Progress Notes (Signed)
Name: Jeffery Campbell   MRN: [id number]    DOB: May 27, 1928   Date:01/20/2015       Progress Note  Subjective  Chief Complaint  Chief Complaint  Patient presents with  . Depression    1 month follow up  . Eye Drainage    watery eyes for 3 days    HPI  Allergic conjunctivitis  Patient has long-standing history of allergic rhinitis. With recent rather changers he's been experiencing quite a bit of watery ocular discharge and itching. There's minimal matting in the a.m  Depression  Patient's noticed some improvement since increasing his Lexapro from 10-20 mg daily. His energy level is improved in his mood is improved and he is not feeling S lightly to having crying episodes as well as before. All this was worsened after his grand son elsewhere..  Hypertension  Patient currently on a regimen of diltiazem and 20 mg once daily. No chest pain palpitations or irregular heartbeat.  Past Medical History  Diagnosis Date  . Diverticulitis   . Hypertension   . Pure hypercholesterolemia   . Hemorrhoids   . OA (osteoarthritis)   . Disturbance of sleep   . Stroke (HCC)   . Arrhythmia   . Depression     Social History  Substance Use Topics  . Smoking status: Never Smoker   . Smokeless tobacco: Not on file  . Alcohol Use: No     Current outpatient prescriptions:  .  atorvastatin (LIPITOR) 20 MG tablet, TAKE 1 TABLET BY MOUTH ONCE A DAY, Disp: 30 tablet, Rfl: 1 .  citalopram (CELEXA) 20 MG tablet, Take 1 tablet (20 mg total) by mouth daily., Disp: 90 tablet, Rfl: 1 .  cyclobenzaprine (FLEXERIL) 5 MG tablet, TAKE 1 TABLET BY MOUTH AT BEDTIME, Disp: 30 tablet, Rfl: 2 .  diazepam (VALIUM) 5 MG tablet, Take 5 mg by mouth every 8 (eight) hours as needed for anxiety., Disp: , Rfl:  .  diltiazem (CARDIZEM CD) 120 MG 24 hr capsule, Take 1 capsule (120 mg total) by mouth daily., Disp: 30 capsule, Rfl: 1 .  dipyridamole-aspirin (AGGRENOX) 200-25 MG per 12 hr capsule, Take 1 capsule by mouth 2  (two) times daily., Disp: 60 capsule, Rfl: 11 .  docusate sodium (COLACE) 100 MG capsule, Take 1 capsule (100 mg total) by mouth 2 (two) times daily., Disp: 60 capsule, Rfl: 0 .  fluticasone (FLONASE) 50 MCG/ACT nasal spray, Place 2 sprays into both nostrils daily., Disp: 16 g, Rfl: 6 .  lansoprazole (PREVACID) 30 MG capsule, Take 30 mg by mouth daily at 12 noon., Disp: , Rfl:  .  mometasone-formoterol (DULERA) 100-5 MCG/ACT AERO, Inhale 1 puff into the lungs 2 (two) times daily., Disp: 1 Inhaler, Rfl: 5 .  Omega 3 1200 MG CAPS, Take by mouth daily., Disp: , Rfl:  .  terazosin (HYTRIN) 5 MG capsule, Take 5 mg by mouth at bedtime., Disp: , Rfl:  .  vitamin B-12 (CYANOCOBALAMIN) 1000 MCG tablet, Take 2,000 mcg by mouth daily. , Disp: , Rfl:  .  prednisoLONE acetate (PRED MILD) 0.12 % ophthalmic suspension, Place 1 drop into both eyes 4 (four) times daily., Disp: 5 mL, Rfl: 0 .  predniSONE (STERAPRED UNI-PAK 21 TAB) 10 MG (21) TBPK tablet, Take 2 tablets (20 mg total) by mouth daily. (Patient not taking: Reported on 01/20/2015), Disp: 10 tablet, Rfl: 0  No Known Allergies  Review of Systems  Constitutional: Negative for fever, chills and weight loss.  HENT: Negative for congestion,  hearing loss, sore throat and tinnitus.   Eyes: Positive for discharge (significant itching.). Negative for blurred vision, double vision and redness.  Respiratory: Negative for cough, hemoptysis and shortness of breath.   Cardiovascular: Negative for chest pain, palpitations, orthopnea, claudication and leg swelling.  Gastrointestinal: Negative for heartburn, nausea, vomiting, diarrhea, constipation and blood in stool.  Genitourinary: Negative for dysuria, urgency, frequency and hematuria.  Musculoskeletal: Negative for myalgias, back pain, joint pain, falls and neck pain.  Skin: Negative for itching.  Neurological: Negative for dizziness, tingling, tremors, focal weakness, seizures, loss of consciousness, weakness  and headaches.  Endo/Heme/Allergies: Does not bruise/bleed easily.  Psychiatric/Behavioral: Negative for depression and substance abuse. The patient is not nervous/anxious and does not have insomnia.      Objective  Filed Vitals:   01/20/15 0843  BP: 132/68  Pulse: 74  Temp: 98.6 F (37 C)  TempSrc: Oral  Resp: 18  Height:  (1.753 m)  Weight: 236 lb (107.049 kg)  SpO2: 95%     Physical Exam  Constitutional: He is oriented to person, place, and time.  Obese male in no acute distress  HENT:  Head: Normocephalic.  Eyes: EOM are normal. Pupils are equal, round, and reactive to light.  Lateral her puffiness which is minimal as well as minimal erythema of the conjunctiva with clear discharge.  Neck: Normal range of motion. Neck supple. No thyromegaly present.  Cardiovascular: Normal rate, regular rhythm and normal heart sounds.   No murmur heard. Pulmonary/Chest: Effort normal and breath sounds normal. No respiratory distress. He has no wheezes.  Abdominal: Soft. Bowel sounds are normal.  Musculoskeletal: He exhibits no edema.  Arthritic changes  Lymphadenopathy:    He has no cervical adenopathy.  Neurological: He is alert and oriented to person, place, and time. No cranial nerve deficit. Gait normal. Coordination normal.  Skin: Skin is warm and dry. No rash noted.  Psychiatric: Affect and judgment normal.  Appears less depressed prior visit      Assessment & Plan  1. Seasonal allergic rhinitis due to pollen Continue fluticasone and over-the-counter antihistamine  2. Clinical depression Improving on citalopram 20 mg daily  3. Allergic conjunctivitis, bilateral Tapering dosage of ocular steroids - prednisoLONE acetate (PRED MILD) 0.12 % ophthalmic suspension; Place 1 drop into both eyes 4 (four) times daily.  Dispense: 5 mL; Refill: 0  4. Essential hypertension Well-controlled

## 2015-01-21 ENCOUNTER — Other Ambulatory Visit: Payer: Self-pay | Admitting: Emergency Medicine

## 2015-01-24 ENCOUNTER — Ambulatory Visit: Payer: Self-pay | Admitting: Family Medicine

## 2015-01-25 ENCOUNTER — Ambulatory Visit: Payer: Self-pay | Admitting: Family Medicine

## 2015-01-27 ENCOUNTER — Telehealth: Payer: Self-pay | Admitting: Family Medicine

## 2015-01-27 NOTE — Telephone Encounter (Signed)
PT NEEDS REF TO [facility].

## 2015-01-31 ENCOUNTER — Telehealth: Payer: Self-pay | Admitting: Family Medicine

## 2015-01-31 NOTE — Telephone Encounter (Signed)
[facility] is needing clarification on predrisolone eye drops.  When calling back please reference order # [id number]

## 2015-02-01 NOTE — Telephone Encounter (Signed)
[facility] called to verify script for eye drops. Order read off to pharmacist

## 2015-02-02 ENCOUNTER — Other Ambulatory Visit: Payer: Self-pay | Admitting: Emergency Medicine

## 2015-02-02 MED ORDER — ATORVASTATIN CALCIUM 20 MG PO TABS: 20.0000 mg | ORAL_TABLET | Freq: Every day | ORAL | Status: DC

## 2015-02-04 ENCOUNTER — Telehealth: Payer: Self-pay | Admitting: Cardiovascular Disease

## 2015-02-04 NOTE — Telephone Encounter (Signed)
Patient says pcp told him he doesn't need to fu with arida any longer.  Patient not interested in sheduling fu from recall.   Deleting recall.

## 2015-02-28 ENCOUNTER — Telehealth: Payer: Self-pay | Admitting: Family Medicine

## 2015-02-28 NOTE — Telephone Encounter (Signed)
Patient is requesting a refill on Terazosin 5mg  [facility] [address]  Please call patient once the request is complete.

## 2015-03-01 ENCOUNTER — Other Ambulatory Visit: Payer: Self-pay | Admitting: Family Medicine

## 2015-03-01 MED ORDER — TERAZOSIN HCL 5 MG PO CAPS: 5.0000 mg | ORAL_CAPSULE | Freq: Every day | ORAL | Status: DC

## 2015-03-02 NOTE — Telephone Encounter (Signed)
30 day supply sent to pharmacy for Terazosin.

## 2015-03-06 ENCOUNTER — Other Ambulatory Visit: Payer: Self-pay | Admitting: Family Medicine

## 2015-03-28 ENCOUNTER — Other Ambulatory Visit: Payer: Self-pay | Admitting: Family Medicine

## 2015-04-12 ENCOUNTER — Other Ambulatory Visit: Payer: Self-pay | Admitting: Family Medicine

## 2015-04-12 MED ORDER — CITALOPRAM HYDROBROMIDE 20 MG PO TABS: 20.0000 mg | ORAL_TABLET | Freq: Every day | ORAL | Status: DC

## 2015-04-25 ENCOUNTER — Telehealth: Payer: Self-pay | Admitting: Family Medicine

## 2015-04-25 MED ORDER — TERAZOSIN HCL 5 MG PO CAPS: ORAL_CAPSULE | ORAL | Status: DC

## 2015-04-25 NOTE — Telephone Encounter (Signed)
Pt [facility] [place]. Pt states if its cheaper for 90 days to get it for that.

## 2015-04-25 NOTE — Telephone Encounter (Signed)
Pt informed about medication.

## 2015-04-25 NOTE — Telephone Encounter (Signed)
Sent rx in to pharmacy for 90 day supply

## 2015-04-28 ENCOUNTER — Emergency Department: Payer: PPO

## 2015-04-28 ENCOUNTER — Emergency Department
Admission: EM | Admit: 2015-04-28 | Discharge: 2015-04-28 | Disposition: A | Discharge status: Home / Self Care | Payer: PPO | Attending: Emergency Medicine | Admitting: Emergency Medicine

## 2015-04-28 DIAGNOSIS — Z79899 Other long term (current) drug therapy: Secondary | ICD-10-CM | POA: Diagnosis not present

## 2015-04-28 DIAGNOSIS — M6281 Muscle weakness (generalized): Secondary | ICD-10-CM | POA: Diagnosis not present

## 2015-04-28 DIAGNOSIS — Z7951 Long term (current) use of inhaled steroids: Secondary | ICD-10-CM | POA: Diagnosis not present

## 2015-04-28 DIAGNOSIS — Z7952 Long term (current) use of systemic steroids: Secondary | ICD-10-CM | POA: Diagnosis not present

## 2015-04-28 DIAGNOSIS — R42 Dizziness and giddiness: Secondary | ICD-10-CM | POA: Diagnosis not present

## 2015-04-28 DIAGNOSIS — N183 Chronic kidney disease, stage 3 (moderate): Secondary | ICD-10-CM | POA: Insufficient documentation

## 2015-04-28 DIAGNOSIS — I129 Hypertensive chronic kidney disease with stage 1 through stage 4 chronic kidney disease, or unspecified chronic kidney disease: Secondary | ICD-10-CM | POA: Insufficient documentation

## 2015-04-28 DIAGNOSIS — Z7982 Long term (current) use of aspirin: Secondary | ICD-10-CM | POA: Diagnosis not present

## 2015-04-28 DIAGNOSIS — R531 Weakness: Secondary | ICD-10-CM | POA: Diagnosis not present

## 2015-04-28 LAB — BASIC METABOLIC PANEL
Anion gap: 6 (ref 5–15)
BUN: 17 mg/dL (ref 6–20)
CHLORIDE: 109 mmol/L (ref 101–111)
CO2: 25 mmol/L (ref 22–32)
Calcium: 8.8 mg/dL — ABNORMAL LOW (ref 8.9–10.3)
Creatinine, Ser: 1.11 mg/dL (ref 0.61–1.24)
GFR calc non Af Amer: 59 mL/min — ABNORMAL LOW (ref >60)
Glucose, Bld: 101 mg/dL — ABNORMAL HIGH (ref 65–99)
POTASSIUM: 3.8 mmol/L (ref 3.5–5.1)
SODIUM: 140 mmol/L (ref 135–145)

## 2015-04-28 LAB — URINALYSIS COMPLETE WITH MICROSCOPIC (ARMC ONLY)
BILIRUBIN URINE: NEGATIVE
Bacteria, UA: NONE SEEN
GLUCOSE, UA: NEGATIVE mg/dL
Hgb urine dipstick: NEGATIVE
KETONES UR: NEGATIVE mg/dL
LEUKOCYTES UA: NEGATIVE
Nitrite: NEGATIVE
Protein, ur: NEGATIVE mg/dL
Specific Gravity, Urine: 1.016 (ref 1.005–1.030)
pH: 6 (ref 5.0–8.0)

## 2015-04-28 LAB — HEPATIC FUNCTION PANEL
ALBUMIN: 3.9 g/dL (ref 3.5–5.0)
ALK PHOS: 48 U/L (ref 38–126)
ALT: 13 U/L — AB (ref 17–63)
AST: 16 U/L (ref 15–41)
BILIRUBIN TOTAL: 1.4 mg/dL — AB (ref 0.3–1.2)
Bilirubin, Direct: 0.2 mg/dL (ref 0.1–0.5)
Indirect Bilirubin: 1.2 mg/dL — ABNORMAL HIGH (ref 0.3–0.9)
TOTAL PROTEIN: 6.8 g/dL (ref 6.5–8.1)

## 2015-04-28 LAB — CBC
HCT: 50.1 % (ref 40.0–52.0)
Hemoglobin: 17.1 g/dL (ref 13.0–18.0)
MCH: 32.1 pg (ref 26.0–34.0)
MCHC: 34.1 g/dL (ref 32.0–36.0)
MCV: 94.2 fL (ref 80.0–100.0)
PLATELETS: 193 10*3/uL (ref 150–440)
RBC: 5.32 MIL/uL (ref 4.40–5.90)
RDW: 12.7 % (ref 11.5–14.5)
WBC: 6.1 10*3/uL (ref 3.8–10.6)

## 2015-04-28 LAB — MAGNESIUM
Magnesium: 2.1 mg/dL (ref 1.7–2.4)
Magnesium: 2.1 mg/dL (ref 1.7–2.4)

## 2015-04-28 LAB — TROPONIN I: Troponin I: <0.03 ng/mL (ref <0.031)

## 2015-04-28 LAB — ETHANOL: Alcohol, Ethyl (B): <5 mg/dL (ref <5)

## 2015-04-28 MED ORDER — SODIUM CHLORIDE 0.9 % IV BOLUS (SEPSIS)
500.0000 mL | Freq: Once | INTRAVENOUS | Status: AC
  Administered 2015-04-28: 500 mL via INTRAVENOUS

## 2015-04-28 NOTE — ED Notes (Signed)
Pt here via [facility] with c/o dizziness upon standing since last night. Pt denies any other symptoms at present, hx of TIA in may 2016.

## 2015-04-28 NOTE — Discharge Instructions (Signed)

## 2015-04-28 NOTE — ED Provider Notes (Addendum)
[facility] Emergency Department Provider Note  ____________________________________________   I have reviewed the triage vital signs and the nursing notes.   HISTORY  Chief Complaint Dizziness    HPI Jeffery Campbell is a 82 y.o. male presents today complaining of feeling lightheaded. He has no other specific complaint. He states it started when he got up to go to the bathroom and now mostly when he sits up he feels somewhat lightheaded. He denies falling or hitting his head. He denies any focal numbness or weakness. Denies any stroke symptoms such as difficulty talking, he denies headache, he denies chest pain or shortness of breath, he denies abdominal pain. He denies melena bright red blood per rectum. He denies diarrhea or vomiting. He denies fever or chills or cough. He denies dysuria or urinary frequency. He denies any inciting symptoms. He denies any change in vision. Denies any pain of any variety. He denies true vertigo.    Past Medical History  Diagnosis Date  . Diverticulitis   . Hypertension   . Pure hypercholesterolemia   . Hemorrhoids   . OA (osteoarthritis)   . Disturbance of sleep   . Stroke (HCC)   . Arrhythmia   . Depression     Patient Active Problem List   Diagnosis Date Noted  . Allergic rhinitis due to pollen 11/12/2014  . Upper respiratory infection 11/12/2014  . Back pain, chronic 10/21/2014  . Benign paroxysmal positional nystagmus 10/21/2014  . Decreased libido 10/21/2014  . Clinical depression 10/21/2014  . Fatigue 10/21/2014  . Gastro-esophageal reflux disease without esophagitis 10/21/2014  . Dysmetabolic syndrome 10/21/2014  . Fungal infection of nail 10/21/2014  . Cerebral vascular accident (HCC) 10/21/2014  . Arrhythmia, ventricular 10/21/2014  . Thalamic infarct, acute (HCC)   . Left hemiparesis (HCC)   . CVA (cerebral infarction) 08/19/2014  . Hypertension 08/19/2014  . HLD (hyperlipidemia) 08/19/2014  . CKD  (chronic kidney disease) stage 3, GFR 30-59 ml/min 08/19/2014  . Accelerated hypertension   . Cerebral infarction due to thrombosis of right carotid artery (HCC)   . Obesity   . Asymptomatic PVCs 05/27/2014  . Bradycardia 04/05/2014  . Essential hypertension 04/05/2014  . Osteoarthrosis involving more than one site but not generalized 08/03/2008  . BP (high blood pressure) 09/27/2006    Past Surgical History  Procedure Laterality Date  . Transurethral resection of prostate    . Knee arthroscopy    . Colonoscopy      Current Outpatient Rx  Name  Route  Sig  Dispense  Refill  . atorvastatin (LIPITOR) 20 MG tablet   Oral   Take 1 tablet (20 mg total) by mouth daily.   90 tablet   0   . citalopram (CELEXA) 20 MG tablet   Oral   Take 1 tablet (20 mg total) by mouth daily.   90 tablet   1   . cyclobenzaprine (FLEXERIL) 5 MG tablet      TAKE 1 TABLET BY MOUTH AT BEDTIME   30 tablet   2   . cyclobenzaprine (FLEXERIL) 5 MG tablet      TAKE 1 TABLET BY MOUTH AT BEDTIME   30 tablet   2   . diazepam (VALIUM) 5 MG tablet   Oral   Take 5 mg by mouth every 8 (eight) hours as needed for anxiety.         [name] diltiazem (CARDIZEM CD) 120 MG 24 hr capsule   Oral   Take 1 capsule (120  mg total) by mouth daily.   30 capsule   1   . dipyridamole-aspirin (AGGRENOX) 200-25 MG per 12 hr capsule   Oral   Take 1 capsule by mouth 2 (two) times daily.   60 capsule   11   . docusate sodium (COLACE) 100 MG capsule   Oral   Take 1 capsule (100 mg total) by mouth 2 (two) times daily.   60 capsule   0   . fluticasone (FLONASE) 50 MCG/ACT nasal spray   Each Nare   Place 2 sprays into both nostrils daily.   16 g   6   . lansoprazole (PREVACID) 30 MG capsule   Oral   Take 30 mg by mouth daily at 12 noon.         . mometasone-formoterol (DULERA) 100-5 MCG/ACT AERO   Inhalation   Inhale 1 puff into the lungs 2 (two) times daily.   1 Inhaler   5   . Omega 3 1200 MG CAPS    Oral   Take by mouth daily.         . prednisoLONE acetate (PRED MILD) 0.12 % ophthalmic suspension   Both Eyes   Place 1 drop into both eyes 4 (four) times daily.   5 mL   0     Qid for 1wk, then bid for 2wk, then qd for 2wk   . predniSONE (STERAPRED UNI-PAK 21 TAB) 10 MG (21) TBPK tablet   Oral   Take 2 tablets (20 mg total) by mouth daily. Patient not taking: Reported on 01/20/2015   10 tablet   0   . terazosin (HYTRIN) 5 MG capsule      TAKE 1 CAPSULE (5 MG TOTAL) BY MOUTH AT BEDTIME.   90 capsule   0   . vitamin B-12 (CYANOCOBALAMIN) 1000 MCG tablet   Oral   Take 2,000 mcg by mouth daily.            Allergies Review of patient's allergies indicates no known allergies.  Family History  Problem Relation Age of Onset  . Hypertension Son     Posey Rea of parents health.    Social History Social History  Substance Use Topics  . Smoking status: Never Smoker   . Smokeless tobacco: Not on file  . Alcohol Use: No    Review of Systems Constitutional: No fever/chills Eyes: No visual changes. ENT: No sore throat. No stiff neck no neck pain Cardiovascular: Denies chest pain. Respiratory: Denies shortness of breath. Gastrointestinal:   no vomiting.  No diarrhea.  No constipation. Genitourinary: Negative for dysuria. Musculoskeletal: Negative lower extremity swelling Skin: Negative for rash. Neurological: Negative for headaches, focal weakness or numbness. 10-point ROS otherwise negative.  ____________________________________________   PHYSICAL EXAM:  VITAL SIGNS: ED Triage Vitals  Enc Vitals Group     BP 04/28/15 0827 150/119 mmHg     Pulse Rate 04/28/15 0827 78     Resp 04/28/15 0827 18     Temp 04/28/15 0827 98 F (36.7 C)     Temp Source 04/28/15 0827 Oral     SpO2 04/28/15 0827 96 %     Weight 04/28/15 0827 235 lb (106.595 kg)     Height 04/28/15 0827 6' (1.829 m)     Head Cir --      Peak Flow --      Pain Score 04/28/15 0827 0     Pain Loc  --      Pain Edu? --  Excl. in GC? --     Constitutional: Alert and oriented. Well appearing and in no acute distress.Markedly well-appearing for a gentleman of his age  Eyes: Conjunctivae are normal. PERRL. EOMI. Head: Atraumatic. Nose: No congestion/rhinnorhea. Mouth/Throat: Mucous membranes are moist.  Oropharynx non-erythematous. Neck: No stridor.   Nontender with no meningismus Cardiovascular: Normal rate, regular rhythm. Grossly normal heart sounds.  Good peripheral circulation. Respiratory: Normal respiratory effort.  No retractions. Lungs CTAB. Abdominal: Soft and nontender. No distention. No guarding no rebound Back:  There is no focal tenderness or step off there is no midline tenderness there are no lesions noted. there is no CVA tenderness Musculoskeletal: No lower extremity tenderness. No joint effusions, no DVT signs strong distal pulses no edema Neurologic:  Cranial nerves II through XII are grossly intact 5 out of 5 strength bilateral upper and lower extremity. Finger to nose within normal limits heel to shin within normal limits, speech is normal with no word finding difficulty or dysarthria, reflexes symmetric, pupils are equally round and reactive to light, there is no pronator drift, sensation is normal, vision is intact to confrontation, gait is deferred, there is no nystagmus, normal neurologic exam Skin:  Skin is warm, dry and intact. No rash noted. Psychiatric: Mood and affect are normal. Speech and behavior are normal.  ____________________________________________   LABS (all labs ordered are listed, but only abnormal results are displayed)  Labs Reviewed  CBC  BASIC METABOLIC PANEL  URINALYSIS COMPLETEWITH MICROSCOPIC ([facility] ONLY)  TROPONIN I  HEPATIC FUNCTION PANEL  ETHANOL  MAGNESIUM   ____________________________________________  EKG  I personally interpreted any EKGs ordered by me or triage Sinus rhythm, PVCs noted, no acute ST elevation or  depression, LAD noted.  ____________________________________________  RADIOLOGY  I reviewed any imaging ordered by me or triage that were performed during my shift ____________________________________________   PROCEDURES  Procedure(s) performed: None  Critical Care performed: None  ____________________________________________   INITIAL IMPRESSION / ASSESSMENT AND PLAN / ED COURSE  Pertinent labs & imaging results that were available during my care of the patient were reviewed by me and considered in my medical decision making (see chart for details).  Very well-appearing 36 gentleman with an NIH stroke scale of 0 presents today complaining of being lightheaded. No recent change in his pressure medications. I'll signs are reassuring, repeat diastolic was 85. Patient has a very reassuring neurologic exam and affect is physical exam is quite reassuring. We will institute a broad workup for this very vague complaint and see if we can find why he feels unwell today   ----------------------------------------- 10:08 AM on 04/28/2015 -----------------------------------------  A shoulder looks quite well in the room. He has no complaint see states he is no longer lightheaded. He is tolerating by mouth. Workup is reassuring. No evidence of cerebellar signs or cerebellar infarct.  We will see if he can ambulate and reassess his ability to perform ADLs prior to possible discharge. He is requesting discharge at this time.  ----------------------------------------- 10:47 AM on 04/28/2015 -----------------------------------------  Patient has no symptoms able to family with no difficulty does not feel lightheaded has no complaints declines further evaluation refuses to stay for further workup and is requesting discharge. Serial neurologic exams are normal serial abdominal exams are normal no evidence of GI bleed patient did not take his blood pressure medication as blood pressure was slightly  off he will take it when he gets home. He will follow-up with his primary care doctor. Patient looks very robust.  He states he [place] the golf [place] he thinks he overdid it. Return precautions and follow-up have been given and understood.   ____________________________________________   FINAL CLINICAL IMPRESSION(S) / ED DIAGNOSES  Final diagnoses:  None    Jeanmarie Plant, MD 04/28/15 1610  Jeanmarie Plant, MD 04/28/15 1008  Jeanmarie Plant, MD 04/28/15 1047

## 2015-05-23 ENCOUNTER — Ambulatory Visit: Payer: Commercial Managed Care - HMO | Admitting: Family Medicine

## 2015-05-24 ENCOUNTER — Ambulatory Visit (INDEPENDENT_AMBULATORY_CARE_PROVIDER_SITE_OTHER): Payer: PPO | Admitting: Family Medicine

## 2015-05-24 ENCOUNTER — Encounter: Payer: Self-pay | Admitting: Family Medicine

## 2015-05-24 VITALS — BP 130/70 | HR 76 | Temp 98.1°F | Resp 16 | Wt 241.9 lb

## 2015-05-24 DIAGNOSIS — I63031 Cerebral infarction due to thrombosis of right carotid artery: Secondary | ICD-10-CM | POA: Diagnosis not present

## 2015-05-24 DIAGNOSIS — E538 Deficiency of other specified B group vitamins: Secondary | ICD-10-CM | POA: Diagnosis not present

## 2015-05-24 DIAGNOSIS — R2681 Unsteadiness on feet: Secondary | ICD-10-CM | POA: Diagnosis not present

## 2015-05-24 DIAGNOSIS — R5383 Other fatigue: Secondary | ICD-10-CM

## 2015-05-24 NOTE — Progress Notes (Signed)
Name: Jeffery Campbell   MRN: [id number]    DOB: Feb 17, 1929   Date:05/24/2015       Progress Note  Subjective  Chief Complaint  Chief Complaint  Patient presents with  . Gait Problem    loss of balance due to after stroke, referral to physical therapist.  . Fatigue    wants B12 levels checked to see if he can have b12 shots.    HPI  Jeffery Campbell is a 82 year old male patient of Dr. Serita Grit Morrisey's. Due to his PCP being out of the office unexpectedly today he is here to see me to discuss post CVA unsteadiness. Last CVA 08/2014 with initial left sided weakness now nearly resolved. No new neurological symptoms reported today. No new falls, no room spinning sensation. Does not use cane or walker to walk. Wants to be checked for B12 levels due to fatigue. On oral supplements but is interested in IM shots. He takes oral SL 301mg of B12 but just noted today he is not taking it as SL but swallowing it.   Patient presents for follow-up of hypertension. It has been present for several years.  Patient states that there is compliance with medical regimen with no side effects. Cardiac risk factors include hx of CVA, hypertension hyperlipidemia and obesity.  Exercise regimen consist of none.  Diet consist of low salt diet.  Past Medical History  Diagnosis Date  . Diverticulitis   . Hypertension   . Pure hypercholesterolemia   . Hemorrhoids   . OA (osteoarthritis)   . Disturbance of sleep   . Stroke ([place]   . Arrhythmia   . Depression     Patient Active Problem List   Diagnosis Date Noted  . Allergic rhinitis due to pollen 11/12/2014  . Upper respiratory infection 11/12/2014  . Back pain, chronic 10/21/2014  . Benign paroxysmal positional nystagmus 10/21/2014  . Decreased libido 10/21/2014  . Clinical depression 10/21/2014  . Fatigue 10/21/2014  . Gastro-esophageal reflux disease without esophagitis 10/21/2014  . Dysmetabolic syndrome 062/83/1517 . Fungal infection of nail 10/21/2014  .  [place] ([place] 10/21/2014  . Arrhythmia, ventricular 10/21/2014  . Thalamic infarct, acute ([place]   . Left hemiparesis ([place]   . CVA (cerebral infarction) 08/19/2014  . Hypertension 08/19/2014  . HLD (hyperlipidemia) 08/19/2014  . CKD (chronic kidney disease) stage 3, GFR 30-59 ml/min 08/19/2014  . Accelerated hypertension   . Cerebral infarction due to thrombosis of right carotid artery ([place]   . Obesity   . Asymptomatic PVCs 05/27/2014  . Bradycardia 04/05/2014  . Essential hypertension 04/05/2014  . Osteoarthrosis involving more than one site but not generalized 08/03/2008  . BP (high blood pressure) 09/27/2006    Social History  Substance Use Topics  . Smoking status: Never Smoker   . Smokeless tobacco: Not on file  . Alcohol Use: No     Current outpatient prescriptions:  .  atorvastatin (LIPITOR) 20 MG tablet, Take 1 tablet (20 mg total) by mouth daily., Disp: 90 tablet, Rfl: 0 .  citalopram (CELEXA) 20 MG tablet, Take 1 tablet (20 mg total) by mouth daily., Disp: 90 tablet, Rfl: 1 .  cyclobenzaprine (FLEXERIL) 5 MG tablet, TAKE 1 TABLET BY MOUTH AT BEDTIME, Disp: 30 tablet, Rfl: 2 .  diazepam (VALIUM) 5 MG tablet, Take 5 mg by mouth every 8 (eight) hours as needed for anxiety., Disp: , Rfl:  .  dipyridamole-aspirin (AGGRENOX) 200-25 MG per 12 hr capsule, Take 1 capsule by  mouth 2 (two) times daily., Disp: 60 capsule, Rfl: 11 .  docusate sodium (COLACE) 100 MG capsule, Take 1 capsule (100 mg total) by mouth 2 (two) times daily., Disp: 60 capsule, Rfl: 0 .  fluticasone (FLONASE) 50 MCG/ACT nasal spray, Place 2 sprays into both nostrils daily., Disp: 16 g, Rfl: 6 .  lansoprazole (PREVACID) 30 MG capsule, Take 30 mg by mouth daily at 12 noon., Disp: , Rfl:  .  mometasone-formoterol (DULERA) 100-5 MCG/ACT AERO, Inhale 1 puff into the lungs 2 (two) times daily., Disp: 1 Inhaler, Rfl: 5 .  Omega 3 1200 MG CAPS, Take by mouth daily., Disp: , Rfl:  .  terazosin  (HYTRIN) 5 MG capsule, TAKE 1 CAPSULE (5 MG TOTAL) BY MOUTH AT BEDTIME., Disp: 90 capsule, Rfl: 0 .  vitamin B-12 (CYANOCOBALAMIN) 1000 MCG tablet, Take 2,000 mcg by mouth daily. , Disp: , Rfl:  .  diltiazem (CARDIZEM CD) 120 MG 24 hr capsule, Take 1 capsule (120 mg total) by mouth daily., Disp: 30 capsule, Rfl: 1 .  prednisoLONE acetate (PRED MILD) 0.12 % ophthalmic suspension, Place 1 drop into both eyes 4 (four) times daily. (Patient not taking: Reported on 05/24/2015), Disp: 5 mL, Rfl: 0 .  predniSONE (STERAPRED UNI-PAK 21 TAB) 10 MG (21) TBPK tablet, Take 2 tablets (20 mg total) by mouth daily. (Patient not taking: Reported on 01/20/2015), Disp: 10 tablet, Rfl: 0  Past Surgical History  Procedure Laterality Date  . Transurethral resection of prostate    . Knee arthroscopy    . Colonoscopy      Family History  Problem Relation Age of Onset  . Hypertension Son     Marena Chancy of parents health.    No Known Allergies   Review of Systems  CONSTITUTIONAL: No significant weight changes, fever, chills. Yes fatigue.  HEENT:  - Eyes: No visual changes.  - Ears: No auditory changes. No pain.  - Nose: No sneezing, congestion, runny nose. - Throat: No sore throat. No changes in swallowing. SKIN: No rash or itching.  CARDIOVASCULAR: No chest pain, chest pressure or chest discomfort. No palpitations or edema.  RESPIRATORY: No shortness of breath, cough or sputum.  GASTROINTESTINAL: No anorexia, nausea, vomiting. No changes in bowel habits. No abdominal pain or blood.  GENITOURINARY: No dysuria. No frequency. No discharge.  NEUROLOGICAL: No headache, syncope, paralysis, ataxia, numbness or tingling in the extremities. No memory changes. No change in bowel or bladder control.  MUSCULOSKELETAL: No joint pain. No muscle pain. HEMATOLOGIC: No anemia, bleeding or bruising.  LYMPHATICS: No enlarged lymph nodes.  PSYCHIATRIC: No change in mood. No change in sleep pattern.  ENDOCRINOLOGIC: No reports  of sweating, cold or heat intolerance. No polyuria or polydipsia.     Objective  BP 130/70 mmHg  Pulse 76  Temp(Src) 98.1 F (36.7 C) (Oral)  Resp 16  Wt 241 lb 14.4 oz (109.725 kg)  SpO2 92% Body mass index is 32.8 kg/(m^2).  Physical Exam  Constitutional: Patient appears well-developed and well-nourished. In no distress.  HEENT:  - Head: Normocephalic and atraumatic.  - Ears: Bilateral TMs gray, no erythema or effusion - Nose: Nasal mucosa moist - Mouth/Throat: Oropharynx is clear and moist. No tonsillar hypertrophy or erythema. No post nasal drainage.  - Eyes: Conjunctivae clear, EOM movements normal. PERRLA. No scleral icterus.  Neck: Normal range of motion. Neck supple. No JVD present. No thyromegaly present.  Cardiovascular: Normal rate, regular rhythm and normal heart sounds.  No murmur heard.  Pulmonary/Chest: Effort  normal and breath sounds normal. No respiratory distress. Musculoskeletal: Normal range of motion bilateral UE and LE, no joint effusions. Bilateral UE strength 5/5, Right LE strength 5/5, Left LE strength 4/5. Peripheral vascular: Bilateral LE no edema. Neurological: CN II-XII grossly intact with no focal deficits. Alert and oriented to person, place, and time. Coordination, strength, speech normal. Balance altered especially with turns and walking fast. Skin: Skin is warm and dry. No rash noted. No erythema.  Psychiatric: Patient has a normal mood and affect. Behavior is normal in office today. Judgment and thought content normal in office today.   Recent Results (from the past 2160 hour(s))  CBC     Status: None   Collection Time: 04/28/15  8:30 AM  Result Value Ref Range   WBC 6.1 3.8 - 10.6 K/uL   RBC 5.32 4.40 - 5.90 MIL/uL   Hemoglobin 17.1 13.0 - 18.0 g/dL   HCT 50.1 40.0 - 52.0 %   MCV 94.2 80.0 - 100.0 fL   MCH 32.1 26.0 - 34.0 pg   MCHC 34.1 32.0 - 36.0 g/dL   RDW 12.7 11.5 - 14.5 %   Platelets 193 150 - 440 K/uL  Magnesium     Status:  None   Collection Time: 04/28/15  8:58 AM  Result Value Ref Range   Magnesium 2.1 1.7 - 2.4 mg/dL  Ethanol     Status: None   Collection Time: 04/28/15  8:59 AM  Result Value Ref Range   Alcohol, Ethyl (B) <5 <5 mg/dL    Comment:        LOWEST DETECTABLE LIMIT FOR SERUM ALCOHOL IS 5 mg/dL FOR MEDICAL PURPOSES ONLY   Basic metabolic panel     Status: Abnormal   Collection Time: 04/28/15  8:59 AM  Result Value Ref Range   Sodium 140 135 - 145 mmol/L   Potassium 3.8 3.5 - 5.1 mmol/L   Chloride 109 101 - 111 mmol/L   CO2 25 22 - 32 mmol/L   Glucose, Bld 101 (H) 65 - 99 mg/dL   BUN 17 6 - 20 mg/dL   Creatinine, Ser 1.11 0.61 - 1.24 mg/dL   Calcium 8.8 (L) 8.9 - 10.3 mg/dL   GFR calc non Af Amer 59 (L) >60 mL/min   GFR calc Af Amer >60 >60 mL/min    Comment: (NOTE) The eGFR has been calculated using the CKD EPI equation. This calculation has not been validated in all clinical situations. eGFR's persistently <60 mL/min signify possible Chronic Kidney Disease.    Anion gap 6 5 - 15  Hepatic function panel     Status: Abnormal   Collection Time: 04/28/15  8:59 AM  Result Value Ref Range   Total Protein 6.8 6.5 - 8.1 g/dL   Albumin 3.9 3.5 - 5.0 g/dL   AST 16 15 - 41 U/L   ALT 13 (L) 17 - 63 U/L   Alkaline Phosphatase 48 38 - 126 U/L   Total Bilirubin 1.4 (H) 0.3 - 1.2 mg/dL   Bilirubin, Direct 0.2 0.1 - 0.5 mg/dL   Indirect Bilirubin 1.2 (H) 0.3 - 0.9 mg/dL  Troponin I     Status: None   Collection Time: 04/28/15  8:59 AM  Result Value Ref Range   Troponin I <0.03 <0.031 ng/mL    Comment:        NO INDICATION OF MYOCARDIAL INJURY.   Magnesium     Status: None   Collection Time: 04/28/15  8:59 AM  Result  Value Ref Range   Magnesium 2.1 1.7 - 2.4 mg/dL  Urinalysis complete, with microscopic ([facility] only)     Status: Abnormal   Collection Time: 04/28/15  9:29 AM  Result Value Ref Range   Color, Urine YELLOW (A) YELLOW   APPearance CLEAR (A) CLEAR   Glucose, UA NEGATIVE  NEGATIVE mg/dL   Bilirubin Urine NEGATIVE NEGATIVE   Ketones, ur NEGATIVE NEGATIVE mg/dL   Specific Gravity, Urine 1.016 1.005 - 1.030   Hgb urine dipstick NEGATIVE NEGATIVE   pH 6.0 5.0 - 8.0   Protein, ur NEGATIVE NEGATIVE mg/dL   Nitrite NEGATIVE NEGATIVE   Leukocytes, UA NEGATIVE NEGATIVE   RBC / HPF 0-5 0 - 5 RBC/hpf   WBC, UA 0-5 0 - 5 WBC/hpf   Bacteria, UA NONE SEEN NONE SEEN   Squamous Epithelial / LPF 0-5 (A) NONE SEEN   Mucous PRESENT      Assessment & Plan   1. Cerebral infarction due to thrombosis of right carotid artery [facility]) Medical optimization and reduction of risk factors.  - Ambulatory referral to Physical Therapy  2. Other fatigue Likely multifactorial.  - B12  3. B12 deficiency Will check serum B12 levels. If low he can start IM shots q monthly.   - B12  4. General unsteadiness Residual effect from CVA. I want him to start PT for strength and balance training. Advised cane use to reduce risk of falls.  - Ambulatory referral to Physical Therapy

## 2015-05-25 LAB — VITAMIN B12: Vitamin B-12: >2000 pg/mL — ABNORMAL HIGH (ref 211–946)

## 2015-05-31 DIAGNOSIS — R2681 Unsteadiness on feet: Secondary | ICD-10-CM | POA: Diagnosis not present

## 2015-05-31 DIAGNOSIS — M6281 Muscle weakness (generalized): Secondary | ICD-10-CM | POA: Diagnosis not present

## 2015-06-02 DIAGNOSIS — M6281 Muscle weakness (generalized): Secondary | ICD-10-CM | POA: Diagnosis not present

## 2015-06-02 DIAGNOSIS — R2681 Unsteadiness on feet: Secondary | ICD-10-CM | POA: Diagnosis not present

## 2015-06-06 DIAGNOSIS — R2681 Unsteadiness on feet: Secondary | ICD-10-CM | POA: Diagnosis not present

## 2015-06-06 DIAGNOSIS — M6281 Muscle weakness (generalized): Secondary | ICD-10-CM | POA: Diagnosis not present

## 2015-06-08 DIAGNOSIS — M6281 Muscle weakness (generalized): Secondary | ICD-10-CM | POA: Diagnosis not present

## 2015-06-08 DIAGNOSIS — R2681 Unsteadiness on feet: Secondary | ICD-10-CM | POA: Diagnosis not present

## 2015-06-12 ENCOUNTER — Other Ambulatory Visit: Payer: Self-pay | Admitting: Family Medicine

## 2015-06-27 ENCOUNTER — Telehealth: Payer: Self-pay | Admitting: Family Medicine

## 2015-06-27 NOTE — Telephone Encounter (Signed)
Requesting refill on Lansoprazole 30mg . If possible please send a [facility]day supply to [facility] if not then just send the 30day supply. Thank you

## 2015-06-28 MED ORDER — LANSOPRAZOLE 30 MG PO CPDR: 30.0000 mg | DELAYED_RELEASE_CAPSULE | Freq: Every day | ORAL | Status: DC

## 2015-06-28 NOTE — Telephone Encounter (Signed)
90 day refill sent to pt pharmacy

## 2015-06-28 NOTE — Telephone Encounter (Signed)
Patient informed. 

## 2015-07-05 ENCOUNTER — Ambulatory Visit: Payer: PPO | Admitting: Family Medicine

## 2015-07-07 ENCOUNTER — Telehealth: Payer: Self-pay | Admitting: Family Medicine

## 2015-07-07 NOTE — Telephone Encounter (Signed)
Pt needs ATORVASTATION 20MG  CALLED INTO [facility]ARM [facility] [address] [facility] [place]

## 2015-07-08 NOTE — Telephone Encounter (Signed)
TRIED CALLING PT BUT NO ANSWER AND NO VOICE MAIL

## 2015-07-08 NOTE — Telephone Encounter (Signed)
Patient informed. 

## 2015-07-08 NOTE — Telephone Encounter (Signed)
Medication was sent on 06/13/15 by Myriam JacobsonHelen [facility] [address] [facility]. Have pt contact pharmacy

## 2015-07-25 ENCOUNTER — Other Ambulatory Visit: Payer: Self-pay | Admitting: Emergency Medicine

## 2015-07-25 MED ORDER — TERAZOSIN HCL 5 MG PO CAPS: ORAL_CAPSULE | ORAL | Status: DC

## 2015-08-12 ENCOUNTER — Other Ambulatory Visit: Payer: Self-pay | Admitting: *Deleted

## 2015-08-12 MED ORDER — DILTIAZEM HCL ER COATED BEADS 120 MG PO CP24: 120.0000 mg | ORAL_CAPSULE | Freq: Every day | ORAL | Status: DC

## 2015-09-26 ENCOUNTER — Other Ambulatory Visit: Payer: Self-pay | Admitting: Family Medicine

## 2015-09-30 ENCOUNTER — Encounter: Payer: Self-pay | Admitting: Family Medicine

## 2015-09-30 ENCOUNTER — Ambulatory Visit (INDEPENDENT_AMBULATORY_CARE_PROVIDER_SITE_OTHER): Payer: PPO | Admitting: Family Medicine

## 2015-09-30 VITALS — BP 122/62 | HR 70 | Temp 98.1°F | Resp 16 | Wt 239.0 lb

## 2015-09-30 DIAGNOSIS — K219 Gastro-esophageal reflux disease without esophagitis: Secondary | ICD-10-CM | POA: Diagnosis not present

## 2015-09-30 DIAGNOSIS — F329 Major depressive disorder, single episode, unspecified: Secondary | ICD-10-CM | POA: Diagnosis not present

## 2015-09-30 DIAGNOSIS — N183 Chronic kidney disease, stage 3 unspecified: Secondary | ICD-10-CM

## 2015-09-30 DIAGNOSIS — Z5181 Encounter for therapeutic drug level monitoring: Secondary | ICD-10-CM | POA: Diagnosis not present

## 2015-09-30 DIAGNOSIS — I10 Essential (primary) hypertension: Secondary | ICD-10-CM | POA: Diagnosis not present

## 2015-09-30 DIAGNOSIS — I499 Cardiac arrhythmia, unspecified: Secondary | ICD-10-CM

## 2015-09-30 DIAGNOSIS — F32A Depression, unspecified: Secondary | ICD-10-CM

## 2015-09-30 DIAGNOSIS — I63131 Cerebral infarction due to embolism of right carotid artery: Secondary | ICD-10-CM | POA: Diagnosis not present

## 2015-09-30 DIAGNOSIS — E785 Hyperlipidemia, unspecified: Secondary | ICD-10-CM | POA: Diagnosis not present

## 2015-09-30 LAB — COMPLETE METABOLIC PANEL WITH GFR
ALK PHOS: 40 U/L (ref 40–115)
ALT: 13 U/L (ref 9–46)
AST: 14 U/L (ref 10–35)
Albumin: 4.2 g/dL (ref 3.6–5.1)
BILIRUBIN TOTAL: 1.2 mg/dL (ref 0.2–1.2)
BUN: 13 mg/dL (ref 7–25)
CO2: 23 mmol/L (ref 20–31)
Calcium: 9.2 mg/dL (ref 8.6–10.3)
Chloride: 106 mmol/L (ref 98–110)
Creat: 1.24 mg/dL — ABNORMAL HIGH (ref 0.70–1.11)
GFR, EST AFRICAN AMERICAN: 61 mL/min (ref >=60)
GFR, EST NON AFRICAN AMERICAN: 53 mL/min — AB (ref >=60)
Glucose, Bld: 96 mg/dL (ref 65–99)
POTASSIUM: 4.6 mmol/L (ref 3.5–5.3)
SODIUM: 140 mmol/L (ref 135–146)
TOTAL PROTEIN: 6.6 g/dL (ref 6.1–8.1)

## 2015-09-30 LAB — CBC WITH DIFFERENTIAL/PLATELET
BASOS PCT: 0 %
Basophils Absolute: 0 cells/uL (ref 0–200)
EOS ABS: 122 {cells}/uL (ref 15–500)
Eosinophils Relative: 2 %
HEMATOCRIT: 47.7 % (ref 38.5–50.0)
HEMOGLOBIN: 16.7 g/dL (ref 13.2–17.1)
LYMPHS ABS: 1586 {cells}/uL (ref 850–3900)
Lymphocytes Relative: 26 %
MCH: 32.6 pg (ref 27.0–33.0)
MCHC: 35 g/dL (ref 32.0–36.0)
MCV: 93.2 fL (ref 80.0–100.0)
MONO ABS: 671 {cells}/uL (ref 200–950)
MPV: 10.7 fL (ref 7.5–12.5)
Monocytes Relative: 11 %
Neutro Abs: 3721 cells/uL (ref 1500–7800)
Neutrophils Relative %: 61 %
Platelets: 212 10*3/uL (ref 140–400)
RBC: 5.12 MIL/uL (ref 4.20–5.80)
RDW: 13.4 % (ref 11.0–15.0)
WBC: 6.1 10*3/uL (ref 3.8–10.8)

## 2015-09-30 LAB — LIPID PANEL
CHOL/HDL RATIO: 2.1 ratio (ref <=5.0)
Cholesterol: 118 mg/dL — ABNORMAL LOW (ref 125–200)
HDL: 57 mg/dL (ref >=40)
LDL CALC: 52 mg/dL (ref <130)
TRIGLYCERIDES: 46 mg/dL (ref <150)
VLDL: 9 mg/dL (ref <30)

## 2015-09-30 MED ORDER — ATORVASTATIN CALCIUM 20 MG PO TABS: 20.0000 mg | ORAL_TABLET | Freq: Every day | ORAL | Status: DC

## 2015-09-30 MED ORDER — RANITIDINE HCL 150 MG PO TABS: 150.0000 mg | ORAL_TABLET | Freq: Every day | ORAL | Status: DC

## 2015-09-30 NOTE — Assessment & Plan Note (Signed)
Well controlled today.

## 2015-09-30 NOTE — Assessment & Plan Note (Signed)
Continue on aggrenox, statin

## 2015-09-30 NOTE — Assessment & Plan Note (Signed)
Stop PPI, long-term use risky; switch to H2 blocker

## 2015-09-30 NOTE — Assessment & Plan Note (Signed)
Check lipids today; continue statin 

## 2015-09-30 NOTE — Assessment & Plan Note (Signed)
Continue SSRI; doing well

## 2015-09-30 NOTE — Progress Notes (Signed)
BP 122/62 mmHg  Pulse 70  Temp(Src) 98.1 F (36.7 C) (Oral)  Resp 16  Wt 239 lb (108.41 kg)  SpO2 92%   Subjective:    Patient ID: Jeffery Campbell, male    DOB: 11/18/1928, 82 y.o.   MRN: [id number]  HPI: Jeffery Campbell is a [age] y.o. male  Chief Complaint  Patient presents with  . Medication Refill   Patient is here for follow-up of multiple medical problems; he is new to me; his usual provider is out for an extended period of time He had a stroke over a year ago; he is fine from that, no residua he says; getting by He had not had a check-up in a while Still taking therapy on his right knee He is active, plays golf He scratched his right arm; few spots of dried blood; no nosebleeds, no gum bleeding, no blood in stool or urine On aggrenox Not seeing neurologist any more; just family Taking atorvastatin No muscle aches Eats well Sees Dr. Achilles Dunkope for prostate issues; on terazosin  [facility] 09/30/2015 01/20/2015 12/29/2014 10/25/2014 10/25/2014  Decreased Interest 0 0 1 0 0  Down, Depressed, Hopeless 0 0 1 0 0  PHQ - 2 Score 0 0 2 0 0  Altered sleeping - - - 0 -  Tired, decreased energy - - - 0 -  Change in appetite - - - 0 -  Feeling bad or failure about yourself  - - - 0 -  Trouble concentrating - - - 0 -  Moving slowly or fidgety/restless - - - 0 -  Suicidal thoughts - - - 0 -  PHQ-9 Score - - - 0 -   Relevant past medical, surgical, family and social history reviewed Past Medical History  Diagnosis Date  . Diverticulitis   . Hypertension   . Pure hypercholesterolemia   . Hemorrhoids   . OA (osteoarthritis)   . Disturbance of sleep   . Stroke (HCC)   . Arrhythmia   . Depression    Past Surgical History  Procedure Laterality Date  . Transurethral resection of prostate    . Knee arthroscopy    . Colonoscopy     Family History  Problem Relation Age of Onset  . Hypertension Son     Posey ReaUnsure of parents health.   Social History  Substance Use Topics  .  Smoking status: Never Smoker   . Smokeless tobacco: None  . Alcohol Use: No   Interim medical history since last visit reviewed. Allergies and medications reviewed  Review of Systems Per HPI unless specifically indicated above     Objective:    BP 122/62 mmHg  Pulse 70  Temp(Src) 98.1 F (36.7 C) (Oral)  Resp 16  Wt 239 lb (108.41 kg)  SpO2 92%  Wt Readings from Last 3 Encounters:  09/30/15 239 lb (108.41 kg)  05/24/15 241 lb 14.4 oz (109.725 kg)  04/28/15 235 lb (106.595 kg)    Physical Exam  Constitutional: He appears well-developed and well-nourished. No distress.  HENT:  Head: Normocephalic and atraumatic.  Eyes: EOM are normal. No scleral icterus.  Neck: No JVD present. Carotid bruit is not present. No thyromegaly present.  Cardiovascular: Normal rate and regular rhythm.   Pulmonary/Chest: Effort normal and breath sounds normal.  Abdominal: Soft. Bowel sounds are normal. He exhibits no distension.  Musculoskeletal: He exhibits no edema.  Neurological: Coordination normal.  Skin: Skin is warm and dry. No pallor.  Thin scratch on the  right arm; no vesicles, dried blood  Psychiatric: He has a normal mood and affect. His behavior is normal. Judgment and thought content normal.      Assessment & Plan:   Problem List Items Addressed This Visit      Cardiovascular and Mediastinum   Essential hypertension    Well-controlled today      Relevant Medications   atorvastatin (LIPITOR) 20 MG tablet   Cerebral vascular accident (HCC) - Primary    Continue on aggrenox, statin      Relevant Medications   atorvastatin (LIPITOR) 20 MG tablet   Other Relevant Orders   Lipid panel (Completed)   Arrhythmia, ventricular    Continue CCB      Relevant Medications   atorvastatin (LIPITOR) 20 MG tablet     Digestive   Gastro-esophageal reflux disease without esophagitis    Stop PPI, long-term use risky; switch to H2 blocker      Relevant Medications   ranitidine  (ZANTAC) 150 MG tablet     Genitourinary   CKD (chronic kidney disease) stage 3, GFR 30-59 ml/min    Check Cr and GFR today        Other   HLD (hyperlipidemia)    Check lipids today; continue statin      Relevant Medications   atorvastatin (LIPITOR) 20 MG tablet   Other Relevant Orders   Lipid panel (Completed)   Clinical depression    Continue SSRI; doing well       Other Visit Diagnoses    Medication monitoring encounter        Relevant Orders    CBC with Differential/Platelet (Completed)    COMPLETE METABOLIC PANEL WITH GFR (Completed)        Follow up plan: Return in about 6 months (around 03/31/2016) for follow-up and fasting labs with Dr. Sherie DonLADA.  An after-visit summary was printed and given to the patient at check-out.  Please see the patient instructions which may contain other information and recommendations beyond what is mentioned above in the assessment and plan.  Meds ordered this encounter  Medications  . atorvastatin (LIPITOR) 20 MG tablet    Sig: Take 1 tablet (20 mg total) by mouth at bedtime.    Dispense:  30 tablet    Refill:  5  . ranitidine (ZANTAC) 150 MG tablet    Sig: Take 1 tablet (150 mg total) by mouth at bedtime. To protect stomach -- STOP lansoprazole    Dispense:  30 tablet    Refill:  0    STOP lansoprazole   Orders Placed This Encounter  Procedures  . CBC with Differential/Platelet  . Lipid panel  . COMPLETE METABOLIC PANEL WITH GFR

## 2015-09-30 NOTE — Assessment & Plan Note (Signed)
Check Cr and GFR today 

## 2015-09-30 NOTE — Patient Instructions (Signed)
STOP lansoprazole START ranitidine at night to help protect stomach We'll get labs today and contact you about the results

## 2015-09-30 NOTE — Assessment & Plan Note (Signed)
Continue CCB 

## 2015-10-03 ENCOUNTER — Other Ambulatory Visit: Payer: Self-pay | Admitting: Family Medicine

## 2015-10-05 NOTE — Telephone Encounter (Signed)
Patient notified of labs.   

## 2015-10-14 ENCOUNTER — Other Ambulatory Visit: Payer: Self-pay | Admitting: Family Medicine

## 2015-10-14 MED ORDER — TERAZOSIN HCL 5 MG PO CAPS: ORAL_CAPSULE | ORAL | Status: DC

## 2015-10-14 NOTE — Telephone Encounter (Signed)
Rxs sent

## 2015-10-14 NOTE — Telephone Encounter (Signed)
Requesting refill on Citalopram and Terazosin. [facility]. States he has enough to last through the weekend.

## 2015-10-18 ENCOUNTER — Telehealth: Payer: Self-pay | Admitting: Family Medicine

## 2015-10-18 NOTE — Telephone Encounter (Signed)
Please call [facility]. They told patient that they did not receive the Citalopram and terazosin prescriptions.

## 2015-10-20 NOTE — Telephone Encounter (Signed)
Called in.

## 2015-10-30 ENCOUNTER — Other Ambulatory Visit: Payer: Self-pay | Admitting: Family Medicine

## 2015-11-15 ENCOUNTER — Other Ambulatory Visit: Payer: Self-pay

## 2015-11-15 MED ORDER — ASPIRIN-DIPYRIDAMOLE ER 25-200 MG PO CP12: 1.0000 | ORAL_CAPSULE | Freq: Two times a day (BID) | ORAL | 11 refills | Status: DC

## 2015-11-15 NOTE — Telephone Encounter (Signed)
[phone] where is can get it discounted

## 2015-11-26 ENCOUNTER — Other Ambulatory Visit: Payer: Self-pay | Admitting: Family Medicine

## 2015-12-01 DIAGNOSIS — H2513 Age-related nuclear cataract, bilateral: Secondary | ICD-10-CM | POA: Diagnosis not present

## 2015-12-24 ENCOUNTER — Other Ambulatory Visit: Payer: Self-pay | Admitting: Cardiovascular Disease

## 2015-12-30 ENCOUNTER — Telehealth: Payer: Self-pay | Admitting: Family Medicine

## 2015-12-30 ENCOUNTER — Other Ambulatory Visit: Payer: Self-pay

## 2015-12-30 MED ORDER — DILTIAZEM HCL ER COATED BEADS 120 MG PO CP24: 120.0000 mg | ORAL_CAPSULE | Freq: Every day | ORAL | 9 refills | Status: DC

## 2015-12-30 NOTE — Telephone Encounter (Signed)
PT NEEDS REFILL ON DILTIAZEM. [facility] [place]. HAS 6 LEFT AND TAKES 1 DAILY.

## 2016-01-10 ENCOUNTER — Encounter: Payer: Self-pay | Admitting: Family Medicine

## 2016-01-10 ENCOUNTER — Ambulatory Visit (INDEPENDENT_AMBULATORY_CARE_PROVIDER_SITE_OTHER): Payer: PPO | Admitting: Family Medicine

## 2016-01-10 VITALS — BP 130/82 | HR 72 | Temp 98.1°F | Resp 14 | Wt 236.0 lb

## 2016-01-10 DIAGNOSIS — R109 Unspecified abdominal pain: Secondary | ICD-10-CM

## 2016-01-10 DIAGNOSIS — R10A1 Flank pain, right side: Secondary | ICD-10-CM | POA: Insufficient documentation

## 2016-01-10 DIAGNOSIS — H8113 Benign paroxysmal vertigo, bilateral: Secondary | ICD-10-CM | POA: Diagnosis not present

## 2016-01-10 MED ORDER — MECLIZINE HCL 25 MG PO TABS: 12.5000 mg | ORAL_TABLET | Freq: Three times a day (TID) | ORAL | 0 refills | Status: DC | PRN

## 2016-01-10 NOTE — Progress Notes (Signed)
BP 130/82   Pulse 72   Temp 98.1 F (36.7 C) (Oral)   Resp 14   Wt 236 lb (107 kg)   SpO2 93%   BMI 32.01 kg/m    Subjective:    Patient ID: Jeffery Campbell, male    DOB: 1928/07/27, 82 y.o.   MRN: [id number]  HPI: Jeffery Campbell is a 82 y.o. male  Chief Complaint  Patient presents with  . Flank Pain    right side onset 1 week, patient states comes and goes   He has been having pain on the right hip; he tried some OTC pain medicine from the pharmacy, as well as some pills for gas; if he passes gas, it is 100% better; no blood in the stool and he is checking; last colonoscopy maybe a decade ago; the more he moves, the better it gets; still working at the [place] [employer] part-time; better over last 3 days No blood in urine  Rushing this morning, dizzy spells from time to time; Dr. Rutherford Nail had given him pills for this before and he'd like a refill; today was the first time in a few years; wants Rx   Depression screen [facility] 2/9 01/10/2016 09/30/2015 01/20/2015 12/29/2014 10/25/2014  Decreased Interest 0 0 0 1 0  Down, Depressed, Hopeless 0 0 0 1 0  PHQ - 2 Score 0 0 0 2 0  Altered sleeping - - - - 0  Tired, decreased energy - - - - 0  Change in appetite - - - - 0  Feeling bad or failure about yourself  - - - - 0  Trouble concentrating - - - - 0  Moving slowly or fidgety/restless - - - - 0  Suicidal thoughts - - - - 0  PHQ-9 Score - - - - 0    Relevant past medical, surgical, family and social history reviewed Past Medical History:  Diagnosis Date  . Arrhythmia   . Depression   . Disturbance of sleep   . Diverticulitis   . Hemorrhoids   . Hypertension   . OA (osteoarthritis)   . Pure hypercholesterolemia   . Stroke [facility])    Past Surgical History:  Procedure Laterality Date  . COLONOSCOPY    . KNEE ARTHROSCOPY    . TRANSURETHRAL RESECTION OF PROSTATE     Family History  Problem Relation Age of Onset  . Hypertension Son     Marena Chancy of parents health.   Social History    Substance Use Topics  . Smoking status: Never Smoker  . Smokeless tobacco: Never Used  . Alcohol use No   Interim medical history since last visit reviewed. Allergies and medications reviewed  Review of Systems Per HPI unless specifically indicated above     Objective:    BP 130/82   Pulse 72   Temp 98.1 F (36.7 C) (Oral)   Resp 14   Wt 236 lb (107 kg)   SpO2 93%   BMI 32.01 kg/m   Wt Readings from Last 3 Encounters:  01/10/16 236 lb (107 kg)  09/30/15 239 lb (108.4 kg)  05/24/15 241 lb 14.4 oz (109.7 kg)    Physical Exam  Constitutional: He appears well-developed and well-nourished. No distress.  Looks at least 10 years younger than stated age  Cardiovascular: Normal rate.   Pulmonary/Chest: Effort normal.  Abdominal: He exhibits no mass. There is no tenderness. No hernia.  Musculoskeletal:       Lumbar back: He exhibits tenderness (mild right-sided  tenderness lower lateral back; no SI joint tenderness, no midline tenderness). He exhibits no bony tenderness, no swelling, no edema, no deformity and no spasm.  Neurological: He is alert. He displays no tremor. Coordination and gait normal.  Skin: No rash (specifically, no vesicular rash over the lower right side of the back/flank) noted. He is not diaphoretic.  Psychiatric: His mood appears not anxious. He does not exhibit a depressed mood.   Diabetic Foot Form - Detailed   Diabetic Foot Exam - detailed Diabetic Foot exam was performed with the following findings:  Yes 01/10/2016  3:07 PM  Visual Foot Exam completed.:  Yes  Are the toenails ingrown?:  No Normal Range of Motion:  Yes Pulse Foot Exam completed.:  Yes  Right Dorsalis Pedis:  Present Left Dorsalis Pedis:  Present  Sensory Foot Exam Completed.:  Yes Swelling:  No Semmes-Weinstein Monofilament Test R Site 1-Great Toe:  Pos L Site 1-Great Toe:  Pos  R Site 4:  Pos L Site 4:  Pos  R Site 5:  Pos L Site 5:  Pos       Results for orders placed or  performed in visit on 09/30/15  CBC with Differential/Platelet  Result Value Ref Range   WBC 6.1 3.8 - 10.8 K/uL   RBC 5.12 4.20 - 5.80 MIL/uL   Hemoglobin 16.7 13.2 - 17.1 g/dL   HCT 47.7 38.5 - 50.0 %   MCV 93.2 80.0 - 100.0 fL   MCH 32.6 27.0 - 33.0 pg   MCHC 35.0 32.0 - 36.0 g/dL   RDW 13.4 11.0 - 15.0 %   Platelets 212 140 - 400 K/uL   MPV 10.7 7.5 - 12.5 fL   Neutro Abs 3,721 1,500 - 7,800 cells/uL   Lymphs Abs 1,586 850 - 3,900 cells/uL   Monocytes Absolute 671 200 - 950 cells/uL   Eosinophils Absolute 122 15 - 500 cells/uL   Basophils Absolute 0 0 - 200 cells/uL   Neutrophils Relative % 61 %   Lymphocytes Relative 26 %   Monocytes Relative 11 %   Eosinophils Relative 2 %   Basophils Relative 0 %   Smear Review Criteria for review not met   Lipid panel  Result Value Ref Range   Cholesterol 118 (L) 125 - 200 mg/dL   Triglycerides 46 <150 mg/dL   HDL 57 >=40 mg/dL   Total CHOL/HDL Ratio 2.1 <=5.0 Ratio   VLDL 9 <30 mg/dL   LDL Cholesterol 52 <130 mg/dL  COMPLETE METABOLIC PANEL WITH GFR  Result Value Ref Range   Sodium 140 135 - 146 mmol/L   Potassium 4.6 3.5 - 5.3 mmol/L   Chloride 106 98 - 110 mmol/L   CO2 23 20 - 31 mmol/L   Glucose, Bld 96 65 - 99 mg/dL   BUN 13 7 - 25 mg/dL   Creat 1.24 (H) 0.70 - 1.11 mg/dL   Total Bilirubin 1.2 0.2 - 1.2 mg/dL   Alkaline Phosphatase 40 40 - 115 U/L   AST 14 10 - 35 U/L   ALT 13 9 - 46 U/L   Total Protein 6.6 6.1 - 8.1 g/dL   Albumin 4.2 3.6 - 5.1 g/dL   Calcium 9.2 8.6 - 10.3 mg/dL   GFR, Est African American 61 >=60 mL/min   GFR, Est Non African American 53 (L) >=60 mL/min      Assessment & Plan:   Problem List Items Addressed This Visit      Nervous and Auditory  Benign paroxysmal positional nystagmus    Refilled the meclizine as requested        Other   Right flank pain - Primary    Might be muscular; might be gas-related; avoid beans and bread; try gas-x; check stool cards x 3 (given); check urine to  r/o hematuria; let me know if comes back or gets worse      Relevant Orders   Urinalysis w microscopic + reflex cultur    Other Visit Diagnoses   None.     Follow up plan: No Follow-up on file.  An after-visit summary was printed and given to the patient at [place].  Please see the patient instructions which may contain other information and recommendations beyond what is mentioned above in the assessment and plan.  Meds ordered this encounter  Medications  . meclizine (ANTIVERT) 25 MG tablet    Sig: Take 0.5-1 tablets (12.5-25 mg total) by mouth 3 (three) times daily as needed for dizziness.    Dispense:  30 tablet    Refill:  0    Orders Placed This Encounter  Procedures  . Urinalysis w microscopic + reflex cultur

## 2016-01-10 NOTE — Assessment & Plan Note (Signed)
Might be muscular; might be gas-related; avoid beans and bread; try gas-x; check stool cards x 3 (given); check urine to r/o hematuria; let me know if comes back or gets worse

## 2016-01-10 NOTE — Assessment & Plan Note (Addendum)
Refilled the meclizine as requested

## 2016-01-10 NOTE — Patient Instructions (Addendum)
Please return the stool cards at your earliest convenience We'll check your urine today Use the dizziness medicine if needed Keep working on modest weight loss, try another 5-10 pounds by the end of the year

## 2016-01-11 LAB — URINALYSIS W MICROSCOPIC + REFLEX CULTURE
Bacteria, UA: NONE SEEN [HPF]
Bilirubin Urine: NEGATIVE
CASTS: NONE SEEN [LPF]
Crystals: NONE SEEN [HPF]
Glucose, UA: NEGATIVE
HGB URINE DIPSTICK: NEGATIVE
Ketones, ur: NEGATIVE
LEUKOCYTES UA: NEGATIVE
NITRITE: NEGATIVE
PH: 5.5 (ref 5.0–8.0)
Protein, ur: NEGATIVE
SPECIFIC GRAVITY, URINE: 1.025 (ref 1.001–1.035)
YEAST: NONE SEEN [HPF]

## 2016-01-16 DIAGNOSIS — H2512 Age-related nuclear cataract, left eye: Secondary | ICD-10-CM | POA: Diagnosis not present

## 2016-01-19 ENCOUNTER — Other Ambulatory Visit: Payer: Self-pay

## 2016-01-19 ENCOUNTER — Other Ambulatory Visit: Payer: Self-pay | Admitting: Family Medicine

## 2016-01-19 DIAGNOSIS — R109 Unspecified abdominal pain: Secondary | ICD-10-CM

## 2016-01-19 LAB — POC HEMOCCULT BLD/STL (HOME/3-CARD/SCREEN)
Card #2 Fecal Occult Blod, POC: NEGATIVE
Card #3 Fecal Occult Blood, POC: NEGATIVE
FECAL OCCULT BLD: NEGATIVE

## 2016-02-07 DIAGNOSIS — H2512 Age-related nuclear cataract, left eye: Secondary | ICD-10-CM | POA: Diagnosis not present

## 2016-02-08 ENCOUNTER — Encounter: Payer: Self-pay | Admitting: *Deleted

## 2016-02-14 ENCOUNTER — Ambulatory Visit
Admission: RE | Admit: 2016-02-14 | Discharge: 2016-02-14 | Disposition: A | Discharge status: Home / Self Care | Payer: PPO | Source: Ambulatory Visit | Attending: Ophthalmology | Admitting: Ophthalmology

## 2016-02-14 ENCOUNTER — Ambulatory Visit: Payer: PPO | Admitting: Anesthesiology

## 2016-02-14 ENCOUNTER — Encounter: Payer: Self-pay | Admitting: *Deleted

## 2016-02-14 ENCOUNTER — Encounter
Admission: RE | Disposition: A | Discharge status: Home / Self Care | Payer: Self-pay | Source: Ambulatory Visit | Attending: Ophthalmology

## 2016-02-14 DIAGNOSIS — K219 Gastro-esophageal reflux disease without esophagitis: Secondary | ICD-10-CM | POA: Diagnosis not present

## 2016-02-14 DIAGNOSIS — I10 Essential (primary) hypertension: Secondary | ICD-10-CM | POA: Insufficient documentation

## 2016-02-14 DIAGNOSIS — E1136 Type 2 diabetes mellitus with diabetic cataract: Secondary | ICD-10-CM | POA: Diagnosis not present

## 2016-02-14 DIAGNOSIS — H2512 Age-related nuclear cataract, left eye: Secondary | ICD-10-CM | POA: Diagnosis not present

## 2016-02-14 DIAGNOSIS — I739 Peripheral vascular disease, unspecified: Secondary | ICD-10-CM | POA: Diagnosis not present

## 2016-02-14 HISTORY — DX: Cough: R05

## 2016-02-14 HISTORY — DX: Gastro-esophageal reflux disease without esophagitis: K21.9

## 2016-02-14 HISTORY — PX: CATARACT EXTRACTION W/PHACO: SHX586

## 2016-02-14 HISTORY — DX: Cough, unspecified: R05.9

## 2016-02-14 SURGERY — PHACOEMULSIFICATION, CATARACT, WITH IOL INSERTION
Anesthesia: Monitor Anesthesia Care | Site: Eye | Laterality: Left | Wound class: Clean

## 2016-02-14 MED ORDER — POVIDONE-IODINE 5 % OP SOLN
OPHTHALMIC | Status: DC | PRN
  Administered 2016-02-14: 1 via OPHTHALMIC

## 2016-02-14 MED ORDER — ARMC OPHTHALMIC DILATING DROPS
1.0000 "application " | OPHTHALMIC | Status: AC
  Administered 2016-02-14 (×3): 1 via OPHTHALMIC

## 2016-02-14 MED ORDER — POVIDONE-IODINE 5 % OP SOLN
OPHTHALMIC | Status: AC
  Filled 2016-02-14: qty 30

## 2016-02-14 MED ORDER — MOXIFLOXACIN HCL 0.5 % OP SOLN
OPHTHALMIC | Status: DC | PRN
  Administered 2016-02-14: 1 [drp] via OPHTHALMIC

## 2016-02-14 MED ORDER — EPINEPHRINE PF 1 MG/ML IJ SOLN
INTRAMUSCULAR | Status: AC
  Filled 2016-02-14: qty 2

## 2016-02-14 MED ORDER — NA CHONDROIT SULF-NA HYALURON 40-17 MG/ML IO SOLN
INTRAOCULAR | Status: DC | PRN
  Administered 2016-02-14: 1 mL via INTRAOCULAR

## 2016-02-14 MED ORDER — FENTANYL CITRATE (PF) 100 MCG/2ML IJ SOLN
INTRAMUSCULAR | Status: DC | PRN
Start: 2016-02-14 — End: 2016-02-14
  Administered 2016-02-14: 50 ug via INTRAVENOUS

## 2016-02-14 MED ORDER — SODIUM CHLORIDE 0.9 % IV SOLN
INTRAVENOUS | Status: DC
  Administered 2016-02-14: 10:00:00 via INTRAVENOUS

## 2016-02-14 MED ORDER — NA CHONDROIT SULF-NA HYALURON 40-17 MG/ML IO SOLN
INTRAOCULAR | Status: AC
  Filled 2016-02-14: qty 1

## 2016-02-14 MED ORDER — MOXIFLOXACIN HCL 0.5 % OP SOLN
1.0000 [drp] | OPHTHALMIC | Status: AC
  Administered 2016-02-14 (×3): 1 [drp] via OPHTHALMIC

## 2016-02-14 MED ORDER — MOXIFLOXACIN HCL 0.5 % OP SOLN
OPHTHALMIC | Status: AC
  Administered 2016-02-14: 1 [drp] via OPHTHALMIC
  Filled 2016-02-14: qty 3

## 2016-02-14 MED ORDER — EPINEPHRINE PF 1 MG/ML IJ SOLN
INTRAOCULAR | Status: DC | PRN
  Administered 2016-02-14: 1 mL via OPHTHALMIC

## 2016-02-14 MED ORDER — ARMC OPHTHALMIC DILATING DROPS
OPHTHALMIC | Status: AC
  Administered 2016-02-14: 1 via OPHTHALMIC
  Filled 2016-02-14: qty 0.4

## 2016-02-14 MED ORDER — CARBACHOL 0.01 % IO SOLN
INTRAOCULAR | Status: DC | PRN
  Administered 2016-02-14: 0.5 mL via INTRAOCULAR

## 2016-02-14 MED ORDER — LIDOCAINE HCL (PF) 4 % IJ SOLN
INTRAMUSCULAR | Status: AC
  Filled 2016-02-14: qty 5

## 2016-02-14 MED ORDER — LIDOCAINE HCL (PF) 4 % IJ SOLN
INTRAOCULAR | Status: DC | PRN
  Administered 2016-02-14: 4 mL via OPHTHALMIC

## 2016-02-14 SURGICAL SUPPLY — 21 items
CANNULA ANT/CHMB 27GA (MISCELLANEOUS) ×3 IMPLANT
CUP MEDICINE 2OZ PLAST GRAD ST (MISCELLANEOUS) ×3 IMPLANT
GLOVE BIO SURGEON STRL SZ8 (GLOVE) ×3 IMPLANT
GLOVE BIOGEL M 6.5 STRL (GLOVE) ×3 IMPLANT
GLOVE SURG LX 8.0 MICRO (GLOVE) ×2
GLOVE SURG LX STRL 8.0 MICRO (GLOVE) ×1 IMPLANT
GOWN STRL REUS W/ TWL LRG LVL3 (GOWN DISPOSABLE) ×2 IMPLANT
GOWN STRL REUS W/TWL LRG LVL3 (GOWN DISPOSABLE) ×4
LENS IOL TECNIS ITEC 20.0 (Intraocular Lens) ×3 IMPLANT
PACK CATARACT (MISCELLANEOUS) ×3 IMPLANT
PACK CATARACT BRASINGTON LX (MISCELLANEOUS) ×3 IMPLANT
PACK EYE AFTER SURG (MISCELLANEOUS) ×3 IMPLANT
SOL BSS BAG (MISCELLANEOUS) ×3
SOL PREP PVP 2OZ (MISCELLANEOUS) ×3
SOLUTION BSS BAG (MISCELLANEOUS) ×1 IMPLANT
SOLUTION PREP PVP 2OZ (MISCELLANEOUS) ×1 IMPLANT
SYR 3ML LL SCALE MARK (SYRINGE) ×3 IMPLANT
SYR 5ML LL (SYRINGE) ×3 IMPLANT
SYR TB 1ML 27GX1/2 LL (SYRINGE) ×3 IMPLANT
WATER STERILE IRR 250ML POUR (IV SOLUTION) ×3 IMPLANT
WIPE NON LINTING 3.25X3.25 (MISCELLANEOUS) ×3 IMPLANT

## 2016-02-14 NOTE — Transfer of Care (Signed)
Immediate Anesthesia Transfer of Care Note  Patient: Merrily BrittleJoe Bounds  Procedure(s) Performed: Procedure(s) with comments: CATARACT EXTRACTION PHACO AND INTRAOCULAR LENS PLACEMENT (IOC) (Left) - US 53.7 AP% 21.5 CDE 11.58 FLUID PACK LOT # [id number]  Patient Location: PACU and Short Stay  Anesthesia Type:MAC  Level of Consciousness: awake, alert  and oriented  Airway & Oxygen Therapy: Patient Spontanous Breathing  Post-op Assessment: Report given to RN and Post -op Vital signs reviewed and stable  Post vital signs: Reviewed and stable  Last Vitals:  Vitals:   02/14/16 0924 02/14/16 1027  BP: (!) 153/75 (!) 161/95  Pulse: 65 (!) 53  Resp: 20 20  Temp: 37 C     Last Pain:  Vitals:   02/14/16 0924  TempSrc: Oral      Patients Stated Pain Goal: 1 (02/14/16 0924)  Complications: No apparent anesthesia complications

## 2016-02-14 NOTE — Anesthesia Preprocedure Evaluation (Signed)
Anesthesia Evaluation  Patient identified by MRN, date of birth, ID band Patient awake    Reviewed: Allergy & Precautions, H&P , NPO status , Patient's Chart, lab work & pertinent test results, reviewed documented beta blocker date and time   Airway Mallampati: II  TM Distance: >3 FB Neck ROM: full    Dental no notable dental hx. (+) Teeth Intact   Pulmonary neg pulmonary ROS,    Pulmonary exam normal breath sounds clear to auscultation       Cardiovascular Exercise Tolerance: Good hypertension, + Peripheral Vascular Disease  negative cardio ROS   Rhythm:regular Rate:Normal     Neuro/Psych PSYCHIATRIC DISORDERS negative neurological ROS  negative psych ROS   GI/Hepatic negative GI ROS, Neg liver ROS, GERD  ,  Endo/Other  negative endocrine ROSdiabetes  Renal/GU Renal disease     Musculoskeletal   Abdominal   Peds  Hematology negative hematology ROS (+)   Anesthesia Other Findings   Reproductive/Obstetrics negative OB ROS                             Anesthesia Physical Anesthesia Plan  ASA: IV  Anesthesia Plan: MAC   Post-op Pain Management:    Induction:   Airway Management Planned:   Additional Equipment:   Intra-op Plan:   Post-operative Plan:   Informed Consent: I have reviewed the patients History and Physical, chart, labs and discussed the procedure including the risks, benefits and alternatives for the proposed anesthesia with the patient or authorized representative who has indicated his/her understanding and acceptance.     Plan Discussed with: CRNA  Anesthesia Plan Comments:         Anesthesia Quick Evaluation

## 2016-02-14 NOTE — H&P (Signed)
All labs reviewed. Abnormal studies sent to patients PCP when indicated.  Previous H&P reviewed, patient examined, there are NO CHANGES.  Jeffery Campbell LOUIS11/14/20179:58 AM

## 2016-02-14 NOTE — Op Note (Signed)
PREOPERATIVE DIAGNOSIS:  Nuclear sclerotic cataract of the left eye.   POSTOPERATIVE DIAGNOSIS:  Nuclear sclerotic cataract of the left eye.   OPERATIVE PROCEDURE: Procedure(s): CATARACT EXTRACTION PHACO AND INTRAOCULAR LENS PLACEMENT (IOC)   SURGEON:  Galen ManilaWilliam Zabella Wease, MD.   ANESTHESIA:  Anesthesiologist: Yevette EdwardsJames G Adams, MD CRNA: Omer JackJanice Weatherly, CRNA  1.      Managed anesthesia care. 2.     0.241ml of Shugarcaine was instilled following the paracentesis   COMPLICATIONS:  None.   TECHNIQUE:   Stop and chop   DESCRIPTION OF PROCEDURE:  The patient was examined and consented in the preoperative holding area where the aforementioned topical anesthesia was applied to the left eye and then brought back to the Operating Room where the left eye was prepped and draped in the usual sterile ophthalmic fashion and a lid speculum was placed. A paracentesis was created with the side port blade and the anterior chamber was filled with viscoelastic. A near clear corneal incision was performed with the steel keratome. A continuous curvilinear capsulorrhexis was performed with a cystotome followed by the capsulorrhexis forceps. Hydrodissection and hydrodelineation were carried out with BSS on a blunt cannula. The lens was removed in a stop and chop  technique and the remaining cortical material was removed with the irrigation-aspiration handpiece. The capsular bag was inflated with viscoelastic and the Technis [id number] lens was placed in the capsular bag without complication. The remaining viscoelastic was removed from the eye with the irrigation-aspiration handpiece. The wounds were hydrated. The anterior chamber was flushed with Miostat and the eye was inflated to physiologic pressure. 0.61ml Vigamox was placed in the anterior chamber. The wounds were found to be water tight. The eye was dressed with Vigamox. The patient was given protective glasses to wear throughout the day and a shield with which to sleep  tonight. The patient was also given drops with which to begin a drop regimen today and will follow-up with me in one day.  Implant Name Type Inv. Item Serial No. Manufacturer Lot No. LRB No. Used  LENS IOL DIOP 20.0 - [id number] Intraocular Lens LENS IOL DIOP 20.0 [id number] AMO   Left 1    Procedure(s) with comments: CATARACT EXTRACTION PHACO AND INTRAOCULAR LENS PLACEMENT (IOC) (Left) - US 53.7 AP% 21.5 CDE 11.58 FLUID PACK LOT # [id number] H  Electronically signed: Keyandre Pileggi LOUIS 02/14/2016 10:26 AM

## 2016-02-14 NOTE — Discharge Instructions (Signed)
Eye Surgery Discharge Instructions  Expect mild scratchy sensation or mild soreness. DO NOT RUB YOUR EYE!  The day of surgery:  Minimal physical activity, but bed rest is not required  No reading, computer work, or close hand work  No bending, lifting, or straining.  May watch TV  For 24 hours:  No driving, legal decisions, or alcoholic beverages  Safety precautions  Eat anything you prefer: It is better to start with liquids, then soup then solid foods.  _____ Eye patch should be worn until postoperative exam tomorrow.  ____ Solar shield eyeglasses should be worn for comfort in the sunlight/patch while sleeping  Resume all regular medications including aspirin or Coumadin if these were discontinued prior to surgery. You may shower, bathe, shave, or wash your hair. Tylenol may be taken for mild discomfort.  Call your doctor if you experience significant pain, nausea, or vomiting, fever > 101 or other signs of infection. [phone] or [phone] Specific instructions:  Follow-up Information    PORFILIO,WILLIAM LOUIS, MD Follow up.   Specialty:  Ophthalmology Why:  November 15 at 1:30pm Contact information: [address] [place] [address] [phone]

## 2016-02-14 NOTE — Anesthesia Postprocedure Evaluation (Signed)
Anesthesia Post Note  Patient: Jeffery Campbell  Procedure(s) Performed: Procedure(s) (LRB): CATARACT EXTRACTION PHACO AND INTRAOCULAR LENS PLACEMENT (IOC) (Left)  Patient location during evaluation: Short Stay Anesthesia Type: MAC Level of consciousness: awake and alert and oriented Pain management: pain level controlled Vital Signs Assessment: post-procedure vital signs reviewed and stable Respiratory status: spontaneous breathing Cardiovascular status: stable Postop Assessment: no headache Anesthetic complications: no    Last Vitals:  Vitals:   02/14/16 0924 02/14/16 1027  BP: (!) 153/75 (!) 161/95  Pulse: 65 (!) 53  Resp: 20 20  Temp: 37 C     Last Pain:  Vitals:   02/14/16 0924  TempSrc: [name]                 [name],  [name] F

## 2016-03-03 ENCOUNTER — Other Ambulatory Visit: Payer: Self-pay | Admitting: Family Medicine

## 2016-03-24 ENCOUNTER — Other Ambulatory Visit: Payer: Self-pay | Admitting: Family Medicine

## 2016-03-29 ENCOUNTER — Other Ambulatory Visit: Payer: Self-pay

## 2016-03-29 MED ORDER — ATORVASTATIN CALCIUM 20 MG PO TABS: 20.0000 mg | ORAL_TABLET | Freq: Every day | ORAL | 5 refills | Status: DC

## 2016-03-29 NOTE — Telephone Encounter (Signed)
Last lipids and sgpt reviewed; rx approved 

## 2016-03-30 ENCOUNTER — Encounter: Payer: Self-pay | Admitting: Family Medicine

## 2016-03-30 ENCOUNTER — Ambulatory Visit (INDEPENDENT_AMBULATORY_CARE_PROVIDER_SITE_OTHER): Payer: PPO | Admitting: Family Medicine

## 2016-03-30 DIAGNOSIS — Z5181 Encounter for therapeutic drug level monitoring: Secondary | ICD-10-CM

## 2016-03-30 DIAGNOSIS — N183 Chronic kidney disease, stage 3 unspecified: Secondary | ICD-10-CM

## 2016-03-30 DIAGNOSIS — I63131 Cerebral infarction due to embolism of right carotid artery: Secondary | ICD-10-CM

## 2016-03-30 DIAGNOSIS — R2681 Unsteadiness on feet: Secondary | ICD-10-CM | POA: Diagnosis not present

## 2016-03-30 DIAGNOSIS — E6609 Other obesity due to excess calories: Secondary | ICD-10-CM

## 2016-03-30 DIAGNOSIS — F3341 Major depressive disorder, recurrent, in partial remission: Secondary | ICD-10-CM

## 2016-03-30 DIAGNOSIS — I10 Essential (primary) hypertension: Secondary | ICD-10-CM

## 2016-03-30 DIAGNOSIS — G8194 Hemiplegia, unspecified affecting left nondominant side: Secondary | ICD-10-CM

## 2016-03-30 DIAGNOSIS — Z6830 Body mass index (BMI) 30.0-30.9, adult: Secondary | ICD-10-CM | POA: Diagnosis not present

## 2016-03-30 DIAGNOSIS — E8881 Metabolic syndrome: Secondary | ICD-10-CM

## 2016-03-30 DIAGNOSIS — E782 Mixed hyperlipidemia: Secondary | ICD-10-CM

## 2016-03-30 MED ORDER — CITALOPRAM HYDROBROMIDE 20 MG PO TABS: 20.0000 mg | ORAL_TABLET | Freq: Every day | ORAL | 1 refills | Status: DC

## 2016-03-30 MED ORDER — ASPIRIN-DIPYRIDAMOLE ER 25-200 MG PO CP12: 1.0000 | ORAL_CAPSULE | Freq: Two times a day (BID) | ORAL | 0 refills | Status: DC

## 2016-03-30 MED ORDER — TERAZOSIN HCL 5 MG PO CAPS: ORAL_CAPSULE | ORAL | 1 refills | Status: DC

## 2016-03-30 NOTE — Progress Notes (Signed)
BP 120/82   Pulse 88   Temp 98 F (36.7 C) (Oral)   Resp 16   Wt 239 lb (108.4 kg)   SpO2 95%   BMI 30.69 kg/m    Subjective:    Patient ID: Jeffery Campbell, male    DOB: 02-19-1929, 82 y.o.   MRN: [id number]  HPI: Jeffery Campbell is a 82 y.o. male  Chief Complaint  Patient presents with  . Follow-up   Here for regular follow-up No medical excitement Nothing bothering him in particular  HTN; controlled; never heard of DASH guidelines; avoids pork bologna Hx of CVA; no new symptoms; on aggrenox and statin; no bleeding; left-sided hemiparesis, stable On citalopram for mood; no depression, no thoughts of self-harm High cholesterol; taking statin; knows to take it at bedtime; cooks two eggs every morning; didn't know to limit; asked if cereal is okay (yes); no milk he says; he'll get back on Cheerios Trouble with balance, feels a little unsteady at times; asked about exercises to help  [facility] 2[facility] 03/30/2016 01/10/2016 09/30/2015 01/20/2015 12/29/2014  Decreased Interest 0 0 0 0 1  Down, Depressed, Hopeless 0 0 0 0 1  PHQ - 2 Score 0 0 0 0 2  Altered sleeping - - - - -  Tired, decreased energy - - - - -  Change in appetite - - - - -  Feeling bad or failure about yourself  - - - - -  Trouble concentrating - - - - -  Moving slowly or fidgety/restless - - - - -  Suicidal thoughts - - - - -  PHQ-9 Score - - - - -    No flowsheet data found.  Relevant past medical, surgical, family and social history reviewed Past Medical History:  Diagnosis Date  . Arrhythmia   . Cough    CHRONIC  . Depression   . Disturbance of sleep   . Diverticulitis   . GERD (gastroesophageal reflux disease)   . Hemorrhoids   . Hypertension   . OA (osteoarthritis)   . Pure hypercholesterolemia   . Stroke [facility])    Past Surgical History:  Procedure Laterality Date  . CATARACT EXTRACTION W/PHACO Left 02/14/2016   Procedure: CATARACT EXTRACTION PHACO AND INTRAOCULAR LENS PLACEMENT (IOC);   Surgeon: Galen ManilaWilliam Porfilio, MD;  Location: [facility] ORS;  Service: Ophthalmology;  Laterality: Left;  US 53.7 AP% 21.5 CDE 11.58 FLUID PACK LOT # [id number] H  . COLONOSCOPY    . KNEE ARTHROSCOPY    . TRANSURETHRAL RESECTION OF PROSTATE     Family History  Problem Relation Age of Onset  . Hypertension Son     Posey ReaUnsure of parents health.   Social History  Substance Use Topics  . Smoking status: Never Smoker  . Smokeless tobacco: Never Used  . Alcohol use No    Interim medical history since last visit reviewed. Allergies and medications reviewed  Review of Systems Per HPI unless specifically indicated above     Objective:    BP 120/82   Pulse 88   Temp 98 F (36.7 C) (Oral)   Resp 16   Wt 239 lb (108.4 kg)   SpO2 95%   BMI 30.69 kg/m   Wt Readings from Last 3 Encounters:  03/30/16 239 lb (108.4 kg)  02/14/16 216 lb (98 kg)  01/10/16 236 lb (107 kg)   Nov weight was with eye doctor; he wasn't weighed he says  Physical Exam  Constitutional: He appears well-developed and well-nourished. No  distress.  HENT:  Head: Normocephalic and atraumatic.  Eyes: EOM are normal. No scleral icterus.  Neck: No JVD present. Carotid bruit is not present. No thyromegaly present.  Cardiovascular: Normal rate and regular rhythm.   Pulmonary/Chest: Effort normal and breath sounds normal.  Abdominal: Soft. Bowel sounds are normal. He exhibits no distension.  Musculoskeletal: He exhibits no edema.  Neurological: He is alert. Gait abnormal. Coordination normal.  Left sided hemiparesis; slow deliberate gait  Skin: Skin is warm and dry. No bruising and no ecchymosis noted. No pallor.  Psychiatric: He has a normal mood and affect. His behavior is normal. Judgment and thought content normal.   Results for orders placed or performed in visit on 01/19/16  POC Hemoccult Bld/Stl (3-Cd Home Screen)  Result Value Ref Range   Card #1 Date 01/12/2016    Fecal Occult Blood, POC Negative Negative   Card #2  Date 01/12/16    Card #2 Fecal Occult Blod, POC Negative    Card #3 Date 01/14/2016    Card #3 Fecal Occult Blood, POC Negative       Assessment & Plan:   Problem List Items Addressed This Visit      Cardiovascular and Mediastinum   Essential hypertension    Well-controlled; try to follow DASH guidelines      Relevant Medications   terazosin (HYTRIN) 5 MG capsule   Cerebral vascular accident (HCC)    Continue aggrenox and statin      Relevant Medications   terazosin (HYTRIN) 5 MG capsule   Other Relevant Orders   Ambulatory referral to Physical Therapy     Nervous and Auditory   Left hemiparesis (HCC)    Stable, from stroke      Relevant Orders   Ambulatory referral to Physical Therapy     Genitourinary   CKD (chronic kidney disease) stage 3, GFR 30-59 ml/min    Monitor renal function with other labs next week        Other   Obesity    Last weight in the chart was not measure; encouraged him to work on weight loss      Medication monitoring encounter    Check labs      Relevant Orders   CBC with Differential/Platelet   COMPLETE METABOLIC PANEL WITH GFR   HLD (hyperlipidemia)    Encouraged him to limit eggs, no more than 3 per week; try to increase whole grains; check lipids today; continue statin      Relevant Medications   terazosin (HYTRIN) 5 MG capsule   Other Relevant Orders   Lipid panel   General unsteadiness    With left hemiparesis; refer to PT      Relevant Orders   Ambulatory referral to Physical Therapy   Dysmetabolic syndrome    Encouraged weight loss, better diet      Clinical depression    He wants to continue SSRI; stable      Relevant Medications   citalopram (CELEXA) 20 MG tablet       Follow up plan: Return in about 6 months (around 10/08/2016) for fasting labs and visit with Dr. Sherie Don.  An after-visit summary was printed and given to the patient at check-out.  Please see the patient instructions which may contain other  information and recommendations beyond what is mentioned above in the assessment and plan.  Meds ordered this encounter  Medications  . dipyridamole-aspirin (AGGRENOX) 200-25 MG 12hr capsule    Sig: Take 1 capsule by mouth  2 (two) times daily.    Dispense:  10 capsule    Refill:  0  . terazosin (HYTRIN) 5 MG capsule    Sig: TAKE 1 CAPSULE (5 MG TOTAL) BY MOUTH AT BEDTIME.    Dispense:  90 capsule    Refill:  1  . citalopram (CELEXA) 20 MG tablet    Sig: Take 1 tablet (20 mg total) by mouth daily.    Dispense:  90 tablet    Refill:  1    Orders Placed This Encounter  Procedures  . CBC with Differential/Platelet  . Lipid panel  . COMPLETE METABOLIC PANEL WITH GFR  . Ambulatory referral to Physical Therapy

## 2016-03-30 NOTE — Patient Instructions (Addendum)
Try to follow the DASH guidelines (DASH stands for Dietary Approaches to Stop Hypertension) Try to limit the sodium in your diet.  Ideally, consume less than 1.5 grams (less than 1,500mg ) per day. Do not add salt when cooking or at the table.  Check the sodium amount on labels when shopping, and choose items lower in sodium when given a choice. Avoid or limit foods that already contain a lot of sodium. Eat a diet rich in fruits and vegetables and whole grains.  Continue the aggrenox and don't miss any doses  Try to limit saturated fats in your diet (bologna, hot dogs, barbeque, cheeseburgers, hamburgers, steak, bacon, sausage, cheese, etc.) and get more fresh fruits, vegetables, and whole grains Limit eggs to no more than 3 per week  DASH Eating Plan DASH stands for "Dietary Approaches to Stop Hypertension." The DASH eating plan is a healthy eating plan that has been shown to reduce high blood pressure (hypertension). Additional health benefits may include reducing the risk of type 2 diabetes mellitus, heart disease, and stroke. The DASH eating plan may also help with weight loss. What do I need to know about the DASH eating plan? For the DASH eating plan, you will follow these general guidelines:  Choose foods with less than 150 milligrams of sodium per serving (as listed on the food label).  Use salt-free seasonings or herbs instead of table salt or sea salt.  Check with your health care provider or pharmacist before using salt substitutes.  Eat lower-sodium products. These are often labeled as "low-sodium" or "no salt added."  Eat fresh foods. Avoid eating a lot of canned foods.  Eat more vegetables, fruits, and low-fat dairy products.  Choose whole grains. Look for the word "whole" as the first word in the ingredient list.  Choose fish and skinless chicken or [place] more often than red meat. Limit fish, poultry, and meat to 6 oz (170 g) each day.  Limit sweets, desserts, sugars,  and sugary drinks.  Choose heart-healthy fats.  Eat more home-cooked food and less restaurant, buffet, and fast food.  Limit fried foods.  Do not fry foods. Cook foods using methods such as baking, boiling, grilling, and broiling instead.  When eating at a restaurant, ask that your food be prepared with less salt, or no salt if possible. What foods can I eat? Seek help from a dietitian for individual calorie needs. Grains  Whole grain or whole wheat bread. Brown rice. Whole grain or whole wheat pasta. Quinoa, bulgur, and whole grain cereals. Low-sodium cereals. Corn or whole wheat flour tortillas. Whole grain cornbread. Whole grain crackers. Low-sodium crackers. Vegetables  Fresh or frozen vegetables (raw, steamed, roasted, or grilled). Low-sodium or reduced-sodium tomato and vegetable juices. Low-sodium or reduced-sodium tomato sauce and paste. Low-sodium or reduced-sodium canned vegetables. Fruits  All fresh, canned (in natural juice), or frozen fruits. Meat and Other Protein Products  Ground beef (85% or leaner), grass-fed beef, or beef trimmed of fat. Skinless chicken or [place]. Ground chicken or [place]. Pork trimmed of fat. All fish and seafood. Eggs. Dried beans, peas, or lentils. Unsalted nuts and seeds. Unsalted canned beans. Dairy  Low-fat dairy products, such as skim or 1% milk, 2% or reduced-fat cheeses, low-fat ricotta or cottage cheese, or plain low-fat yogurt. Low-sodium or reduced-sodium cheeses. Fats and Oils  Tub margarines without trans fats. Light or reduced-fat mayonnaise and salad dressings (reduced sodium). Avocado. Safflower, olive, or canola oils. Natural peanut or almond butter. Other  Unsalted popcorn and  pretzels. The items listed above may not be a complete list of recommended foods or beverages. Contact your dietitian for more options.  What foods are not recommended? Grains  White bread. White pasta. White rice. Refined cornbread. Bagels and croissants.  Crackers that contain trans fat. Vegetables  Creamed or fried vegetables. Vegetables in a cheese sauce. Regular canned vegetables. Regular canned tomato sauce and paste. Regular tomato and vegetable juices. Fruits  Canned fruit in light or heavy syrup. Fruit juice. Meat and Other Protein Products  Fatty cuts of meat. Ribs, chicken wings, bacon, sausage, bologna, salami, chitterlings, fatback, hot dogs, bratwurst, and packaged luncheon meats. Salted nuts and seeds. Canned beans with salt. Dairy  Whole or 2% milk, cream, half-and-half, and cream cheese. Whole-fat or sweetened yogurt. Full-fat cheeses or blue cheese. Nondairy creamers and whipped toppings. Processed cheese, cheese spreads, or cheese curds. Condiments  Onion and garlic salt, seasoned salt, table salt, and sea salt. Canned and packaged gravies. Worcestershire sauce. Tartar sauce. Barbecue sauce. Teriyaki sauce. Soy sauce, including reduced sodium. Steak sauce. Fish sauce. Oyster sauce. Cocktail sauce. Horseradish. Ketchup and mustard. Meat flavorings and tenderizers. Bouillon cubes. Hot sauce. Tabasco sauce. Marinades. Taco seasonings. Relishes. Fats and Oils  Butter, stick margarine, lard, shortening, ghee, and bacon fat. Coconut, palm kernel, or palm oils. Regular salad dressings. Other  Pickles and olives. Salted popcorn and pretzels. The items listed above may not be a complete list of foods and beverages to avoid. Contact your dietitian for more information.  Where can I find more information? National Heart, Lung, and Blood Institute: CablePromo.itwww.nhlbi.nih.gov/health/health-topics/topics/dash/ This information is not intended to replace advice given to you by your health care provider. Make sure you discuss any questions you have with your health care provider. Document Released: 03/08/2011 Document Revised: 08/25/2015 Document Reviewed: 01/21/2013 Elsevier Interactive Patient Education  2017 [employer]

## 2016-03-30 NOTE — Assessment & Plan Note (Signed)
Encouraged him to limit eggs, no more than 3 per week; try to increase whole grains; check lipids today; continue statin

## 2016-03-30 NOTE — Assessment & Plan Note (Signed)
Monitor renal function with other labs next week

## 2016-03-30 NOTE — Assessment & Plan Note (Signed)
Stable, from stroke

## 2016-03-30 NOTE — Assessment & Plan Note (Signed)
Encouraged weight loss, better diet

## 2016-03-30 NOTE — Progress Notes (Signed)
HPI    Review of Systems        Physical Exam

## 2016-03-30 NOTE — Assessment & Plan Note (Signed)
Last weight in the chart was not measure; encouraged him to work on weight loss

## 2016-03-30 NOTE — Assessment & Plan Note (Signed)
Check labs 

## 2016-03-30 NOTE — Assessment & Plan Note (Signed)
With left hemiparesis; refer to PT

## 2016-03-30 NOTE — Assessment & Plan Note (Signed)
Continue aggrenox and statin.  

## 2016-03-30 NOTE — Assessment & Plan Note (Signed)
Well-controlled; try to follow DASH guidelines 

## 2016-03-30 NOTE — Assessment & Plan Note (Signed)
He wants to continue SSRI; stable

## 2016-04-03 ENCOUNTER — Other Ambulatory Visit: Payer: Self-pay | Admitting: Family Medicine

## 2016-04-03 DIAGNOSIS — Z5181 Encounter for therapeutic drug level monitoring: Secondary | ICD-10-CM | POA: Diagnosis not present

## 2016-04-03 DIAGNOSIS — E782 Mixed hyperlipidemia: Secondary | ICD-10-CM | POA: Diagnosis not present

## 2016-04-03 LAB — CBC WITH DIFFERENTIAL/PLATELET
BASOS PCT: 0 %
Basophils Absolute: 0 cells/uL (ref 0–200)
EOS ABS: 165 {cells}/uL (ref 15–500)
EOS PCT: 3 %
HCT: 50.1 % — ABNORMAL HIGH (ref 38.5–50.0)
HEMOGLOBIN: 17.4 g/dL — AB (ref 13.2–17.1)
LYMPHS ABS: 1320 {cells}/uL (ref 850–3900)
Lymphocytes Relative: 24 %
MCH: 33 pg (ref 27.0–33.0)
MCHC: 34.7 g/dL (ref 32.0–36.0)
MCV: 94.9 fL (ref 80.0–100.0)
MONOS PCT: 12 %
MPV: 10.5 fL (ref 7.5–12.5)
Monocytes Absolute: 660 cells/uL (ref 200–950)
NEUTROS ABS: 3355 {cells}/uL (ref 1500–7800)
Neutrophils Relative %: 61 %
PLATELETS: 216 10*3/uL (ref 140–400)
RBC: 5.28 MIL/uL (ref 4.20–5.80)
RDW: 13.1 % (ref 11.0–15.0)
WBC: 5.5 10*3/uL (ref 3.8–10.8)

## 2016-04-03 LAB — LIPID PANEL
CHOL/HDL RATIO: 2.6 ratio (ref <5.0)
CHOLESTEROL: 127 mg/dL (ref <200)
HDL: 49 mg/dL (ref >40)
LDL Cholesterol: 66 mg/dL (ref <100)
Triglycerides: 58 mg/dL (ref <150)
VLDL: 12 mg/dL (ref <30)

## 2016-04-03 LAB — COMPLETE METABOLIC PANEL WITH GFR
ALBUMIN: 4.2 g/dL (ref 3.6–5.1)
ALK PHOS: 46 U/L (ref 40–115)
ALT: 9 U/L (ref 9–46)
AST: 13 U/L (ref 10–35)
BUN: 14 mg/dL (ref 7–25)
CALCIUM: 9.1 mg/dL (ref 8.6–10.3)
CO2: 27 mmol/L (ref 20–31)
Chloride: 106 mmol/L (ref 98–110)
Creat: 1.23 mg/dL — ABNORMAL HIGH (ref 0.70–1.11)
GFR, EST AFRICAN AMERICAN: 61 mL/min (ref >=60)
GFR, EST NON AFRICAN AMERICAN: 53 mL/min — AB (ref >=60)
Glucose, Bld: 97 mg/dL (ref 65–99)
POTASSIUM: 4.5 mmol/L (ref 3.5–5.3)
Sodium: 140 mmol/L (ref 135–146)
Total Bilirubin: 1.4 mg/dL — ABNORMAL HIGH (ref 0.2–1.2)
Total Protein: 6.6 g/dL (ref 6.1–8.1)

## 2016-04-03 MED ORDER — ASPIRIN-DIPYRIDAMOLE ER 25-200 MG PO CP12: 1.0000 | ORAL_CAPSULE | Freq: Two times a day (BID) | ORAL | 0 refills | Status: DC

## 2016-04-03 NOTE — Progress Notes (Signed)
Pharmacy sent note; they can't open a box and give him just 10; sending all 60

## 2016-04-04 ENCOUNTER — Encounter: Payer: Self-pay | Admitting: Family Medicine

## 2016-04-04 DIAGNOSIS — R718 Other abnormality of red blood cells: Secondary | ICD-10-CM | POA: Insufficient documentation

## 2016-04-05 ENCOUNTER — Other Ambulatory Visit: Payer: Self-pay

## 2016-04-05 DIAGNOSIS — R718 Other abnormality of red blood cells: Secondary | ICD-10-CM

## 2016-04-10 ENCOUNTER — Encounter: Payer: Self-pay | Admitting: Pulmonary Disease

## 2016-04-10 DIAGNOSIS — G4734 Idiopathic sleep related nonobstructive alveolar hypoventilation: Secondary | ICD-10-CM

## 2016-04-19 ENCOUNTER — Other Ambulatory Visit: Payer: Self-pay | Admitting: Family Medicine

## 2016-04-20 ENCOUNTER — Encounter: Payer: Self-pay | Admitting: Family Medicine

## 2016-04-20 ENCOUNTER — Telehealth: Payer: Self-pay | Admitting: Family Medicine

## 2016-04-20 DIAGNOSIS — G4734 Idiopathic sleep related nonobstructive alveolar hypoventilation: Secondary | ICD-10-CM | POA: Insufficient documentation

## 2016-04-20 NOTE — Telephone Encounter (Signed)
Please let patient know that his oxygen level does indeed drop during sleep We'll get him started on oxygen via nasal cannula at 2 liters/minute overnight and have him see the pulmonologist to see if he has sleep apnea Thank you

## 2016-04-20 NOTE — Telephone Encounter (Signed)
Called patient regarding his pulse ox overnight we did. Went over the results with patient. I put in a referral for pulmonary but I don't see they call and schedule him an appointment.v Mention to patient I will call [facility] in [place] where his referral was sent to and see what happen.

## 2016-04-23 ENCOUNTER — Telehealth: Payer: Self-pay

## 2016-04-23 NOTE — Telephone Encounter (Signed)
Called Patient regarding his Pulmonary appointment for [facility] in [place]. Appointment is schedule  For february 27th 2018 @ 9:00am. Patient notified of the appointment

## 2016-04-24 DIAGNOSIS — G4734 Idiopathic sleep related nonobstructive alveolar hypoventilation: Secondary | ICD-10-CM | POA: Diagnosis not present

## 2016-05-05 NOTE — Telephone Encounter (Signed)
Open phone note Pulmonary appointment is scheduled Please make sure patient is aware of appointment Closing this phone note

## 2016-05-25 DIAGNOSIS — G4734 Idiopathic sleep related nonobstructive alveolar hypoventilation: Secondary | ICD-10-CM | POA: Diagnosis not present

## 2016-05-29 ENCOUNTER — Ambulatory Visit (INDEPENDENT_AMBULATORY_CARE_PROVIDER_SITE_OTHER): Payer: PPO | Admitting: Pulmonary Disease

## 2016-05-29 ENCOUNTER — Encounter: Payer: Self-pay | Admitting: Pulmonary Disease

## 2016-05-29 VITALS — BP 132/82 | HR 66 | Wt 242.0 lb

## 2016-05-29 DIAGNOSIS — D751 Secondary polycythemia: Secondary | ICD-10-CM | POA: Diagnosis not present

## 2016-05-29 DIAGNOSIS — R911 Solitary pulmonary nodule: Secondary | ICD-10-CM

## 2016-05-29 DIAGNOSIS — G4734 Idiopathic sleep related nonobstructive alveolar hypoventilation: Secondary | ICD-10-CM

## 2016-05-29 NOTE — Progress Notes (Signed)
PULMONARY CONSULT NOTE  Requesting MD/Service: Lada Date of initial consultation: 05/29/16 Reason for consultation:  Polycythemia  PT PROFILE: 83 y.o. M never smoker referred for evaluation of polycythemia and nocturnal hypoxemia  DATA: Overnight oximetry (RA) 04/10/16: Lowest SpO2 79%, Avg 25 desaturation events per hour  HPI:  Very pleasant 83 yo M who is not entirely sure why he is referred to see me. Piecing the story together, I think he was discovered to have elevated Hct and an overnight oximetry was performed with results as noted above. He has been prescribed nocturnal oxygen and is wearing as prescribed. He does not know the flow rate. He is unable to discern any difference in his sleep quality. He does not know if he snores. He does not report significant daytime hypersomnolence. He feels rested when he awakens. His PMH has been reviewed and is most notable for Htn and TIA/CVA. He continues to work and Education administrator. He has never smoked and has no respiratory complaints. Specifically, he denies DOE, orthopnea, PND, CP, fever, purulent sputum, hemoptysis, LE edema and calf tenderness.    Past Medical History:  Diagnosis Date  . Arrhythmia   . Cough    CHRONIC  . Depression   . Disturbance of sleep   . Diverticulitis   . GERD (gastroesophageal reflux disease)   . Hemorrhoids   . Hypertension   . OA (osteoarthritis)   . Pure hypercholesterolemia   . Stroke [facility])     Past Surgical History:  Procedure Laterality Date  . CATARACT EXTRACTION W/PHACO Left 02/14/2016   Procedure: CATARACT EXTRACTION PHACO AND INTRAOCULAR LENS PLACEMENT (IOC);  Surgeon: Galen Manila, MD;  Location: [facility] ORS;  Service: Ophthalmology;  Laterality: Left;  [place] 53.7 AP% 21.5 CDE 11.58 FLUID PACK LOT # [id number]  . COLONOSCOPY    . KNEE ARTHROSCOPY    . TRANSURETHRAL RESECTION OF PROSTATE      MEDICATIONS: I have reviewed all medications and confirmed regimen as documented  Social History    Social History  . Marital status: Divorced    Spouse name: N/A  . Number of children: 2  . Years of education: 12   Occupational History  . Retired    Social History Main Topics  . Smoking status: Never Smoker  . Smokeless tobacco: Never Used  . Alcohol use No  . Drug use: No  . Sexual activity: Not Currently   Other Topics Concern  . Not on file   Social History Narrative   Lives at home alone.   Writes left-handed - does everything else with right-hand.   No caffeine use.    Family History  Problem Relation Age of Onset  . Hypertension Son     Posey Rea of parents health.    ROS: No fever, myalgias/arthralgias, unexplained weight loss or weight gain No new focal weakness or sensory deficits No otalgia, hearing loss, visual changes, nasal and sinus symptoms, mouth and throat problems No neck pain or adenopathy No abdominal pain, N/V/D, diarrhea, change in bowel pattern No dysuria, change in urinary pattern   Vitals:   05/29/16 0843  BP: 132/82  Pulse: 66  SpO2: 95%  Weight: 242 lb (109.8 kg)     EXAM:  Gen: WDWN, No overt respiratory distress HEENT: NCAT, sclera white, oropharynx normal Neck: Supple without LAN, thyromegaly, JVD Lungs: breath sounds: full, percussion: normal, No adventitious sounds Cardiovascular: RRR, no murmurs noted Abdomen: Soft, nontender, normal BS Ext: without clubbing, cyanosis, edema Neuro: CNs grossly intact, motor and  sensory intact Skin: Limited exam, no lesions noted  DATA:   BMP Latest Ref Rng & Units 04/03/2016 09/30/2015 04/28/2015  Glucose 65 - 99 mg/dL 97 96 536(U101(H)  BUN 7 - 25 mg/dL 14 13 17   Creatinine 0.70 - 1.11 mg/dL 4.40(H1.23(H) 4.74(Q1.24(H) 5.951.11  BUN/Creat Ratio 10 - 22 - - -  Sodium 135 - 146 mmol/L 140 140 140  Potassium 3.5 - 5.3 mmol/L 4.5 4.6 3.8  Chloride 98 - 110 mmol/L 106 106 109  CO2 20 - 31 mmol/L 27 23 25   Calcium 8.6 - 10.3 mg/dL 9.1 9.2 6.3(O8.8(L)    CBC Latest Ref Rng & Units 04/03/2016 09/30/2015 04/28/2015   WBC 3.8 - 10.8 K/uL 5.5 6.1 6.1  Hemoglobin 13.2 - 17.1 g/dL 17.4(H) 16.7 17.1  Hematocrit 38.5 - 50.0 % 50.1(H) 47.7 50.1  Platelets 140 - 400 K/uL 216 212 193    CXR:  04/28/15 - very mild IS prominence, possible RUL nodule  IMPRESSION:     ICD-9-CM ICD-10-CM   1. Nocturnal hypoxia 327.24 G47.34 Pulse oximetry, overnight  2. Polycythemia, secondary 289.0 D75.1   3. Solitary pulmonary nodule 793.11 R91.1 DG Chest 2 View    PLAN:  1) Will repeat overnight oximetry on 2 lpm [place]. If SpO2 remains adequate on that study, no further eval will be needed. If he still has significant desaturations, we will consider a complete sleep study to look for OSA or other sleep breathing disorders  2) F/U in 4 months with CXR   Billy Fischeravid Vishal Sandlin, MD PCCM service Mobile [phone] Pager [phone] 05/29/2016

## 2016-05-29 NOTE — Patient Instructions (Addendum)
We will repeat the overnight oxygen test while you are wearing oxygen. If your oxygen levels remain OK while wearing the oxygen, no further investigation will be needed. If you are still having trouble with your oxygen levels dropping, we will consider further testing of your sleep to look for sleep apnea  Follow up in 3-4 months with a chest Xray

## 2016-05-31 ENCOUNTER — Encounter: Payer: Self-pay | Admitting: Pulmonary Disease

## 2016-05-31 DIAGNOSIS — R400 Somnolence: Secondary | ICD-10-CM

## 2016-06-08 ENCOUNTER — Telehealth: Payer: Self-pay | Admitting: *Deleted

## 2016-06-08 DIAGNOSIS — R400 Somnolence: Secondary | ICD-10-CM

## 2016-06-08 NOTE — Telephone Encounter (Signed)
Pt aware of ONO results and that DS wants pt to have a sleep study. Order placed. Nothing  Further needed.

## 2016-06-22 DIAGNOSIS — G4734 Idiopathic sleep related nonobstructive alveolar hypoventilation: Secondary | ICD-10-CM | POA: Diagnosis not present

## 2016-06-27 ENCOUNTER — Telehealth: Payer: Self-pay | Admitting: Emergency Medicine

## 2016-06-27 NOTE — Telephone Encounter (Signed)
Can you tell him something he can take over the counter for allergies. Has runny nose.

## 2016-06-27 NOTE — Telephone Encounter (Signed)
Plain claritin, plain allegra, or plain zyrtec Recommend that he stay far away from anything with decongestants like Claritin-D If it has a "-D" on the end, it means "Don't buy it"

## 2016-06-28 NOTE — Telephone Encounter (Signed)
Patient notified

## 2016-07-10 ENCOUNTER — Encounter: Payer: Self-pay | Admitting: Internal Medicine

## 2016-07-10 ENCOUNTER — Ambulatory Visit: Payer: PPO | Attending: Internal Medicine

## 2016-07-10 DIAGNOSIS — G4733 Obstructive sleep apnea (adult) (pediatric): Secondary | ICD-10-CM

## 2016-07-10 DIAGNOSIS — R0683 Snoring: Secondary | ICD-10-CM | POA: Diagnosis not present

## 2016-07-19 DIAGNOSIS — G4733 Obstructive sleep apnea (adult) (pediatric): Secondary | ICD-10-CM | POA: Diagnosis not present

## 2016-07-20 ENCOUNTER — Telehealth: Payer: Self-pay | Admitting: *Deleted

## 2016-07-20 DIAGNOSIS — G4733 Obstructive sleep apnea (adult) (pediatric): Secondary | ICD-10-CM

## 2016-07-20 NOTE — Telephone Encounter (Signed)
-----   Message from Shane Crutch, MD sent at 07/19/2016  4:59 PM EDT ----- Regarding: PSG results Sleep study shows severe OSA with AHI of 40.   Recommend Sleep study with CPAP titration

## 2016-07-20 NOTE — Telephone Encounter (Signed)
Pt informed of results. Titration study ordered. Nothing further needed.

## 2016-07-23 DIAGNOSIS — G4734 Idiopathic sleep related nonobstructive alveolar hypoventilation: Secondary | ICD-10-CM | POA: Diagnosis not present

## 2016-07-31 ENCOUNTER — Encounter: Payer: Self-pay | Admitting: Internal Medicine

## 2016-07-31 ENCOUNTER — Ambulatory Visit: Payer: PPO | Attending: Internal Medicine

## 2016-07-31 DIAGNOSIS — G4761 Periodic limb movement disorder: Secondary | ICD-10-CM | POA: Insufficient documentation

## 2016-07-31 DIAGNOSIS — G4733 Obstructive sleep apnea (adult) (pediatric): Secondary | ICD-10-CM | POA: Diagnosis not present

## 2016-08-02 DIAGNOSIS — G4733 Obstructive sleep apnea (adult) (pediatric): Secondary | ICD-10-CM | POA: Diagnosis not present

## 2016-08-03 ENCOUNTER — Telehealth: Payer: Self-pay | Admitting: *Deleted

## 2016-08-03 NOTE — Telephone Encounter (Signed)
Tried to call pt to give titration results but no answer or no VM available.

## 2016-08-03 NOTE — Telephone Encounter (Signed)
-----   Message from Shane CrutchPradeep Ramachandran, MD sent at 08/02/2016  3:18 PM EDT ----- Regarding: cpap titration results  Auto- CPAP of 10-20  cmH2O Check ferritin level, if below 45, would start iron supplementation, if above, would consider primary treatment of PLMS with pramipexole or ropinirole.

## 2016-08-07 NOTE — Telephone Encounter (Signed)
Tried to call pt on home number no answer. LMOM for pt to return call.

## 2016-08-08 NOTE — Telephone Encounter (Signed)
Spoke with pt and he states he got a CPAP thru his PCP 2 months ago but he is not sure what company he got it through. States he will talk with DS when he comes in for his appt on 08-13-16. Nothing further needed at this time.

## 2016-08-09 ENCOUNTER — Other Ambulatory Visit: Payer: Self-pay | Admitting: Family Medicine

## 2016-08-09 NOTE — Telephone Encounter (Signed)
ot on cureent

## 2016-08-09 NOTE — Telephone Encounter (Signed)
Left detailed voicemail that it is not on current med list nor have we ever prescribed.

## 2016-08-09 NOTE — Telephone Encounter (Signed)
Pt is in need of a refill on Meclizine that was filled on 04/19/16

## 2016-08-09 NOTE — Telephone Encounter (Signed)
Pt requesting refill on mexlizine 25mg  [facility]. He will need it by the weekend if possible

## 2016-08-10 ENCOUNTER — Other Ambulatory Visit: Payer: Self-pay | Admitting: Family Medicine

## 2016-08-10 MED ORDER — MECLIZINE HCL 25 MG PO TABS: 12.5000 mg | ORAL_TABLET | Freq: Three times a day (TID) | ORAL | 2 refills | Status: DC | PRN

## 2016-08-10 NOTE — Addendum Note (Signed)
Addended by: Davene CostainGRAVES, Shaylen Nephew C on: 08/10/2016 10:15 AM   Modules accepted: Orders

## 2016-08-10 NOTE — Telephone Encounter (Signed)
Sorry wrong med, I will send to Dr. Sherie DonLada

## 2016-08-10 NOTE — Addendum Note (Signed)
Addended by: LADA, Janit BernMELINDA P on: 08/10/2016 01:25 PM   Modules accepted: Orders

## 2016-08-13 ENCOUNTER — Ambulatory Visit (INDEPENDENT_AMBULATORY_CARE_PROVIDER_SITE_OTHER): Payer: PPO | Admitting: Pulmonary Disease

## 2016-08-13 ENCOUNTER — Encounter: Payer: Self-pay | Admitting: Pulmonary Disease

## 2016-08-13 ENCOUNTER — Ambulatory Visit
Admission: RE | Admit: 2016-08-13 | Discharge: 2016-08-13 | Disposition: A | Discharge status: Home / Self Care | Payer: PPO | Source: Ambulatory Visit | Attending: Pulmonary Disease | Admitting: Pulmonary Disease

## 2016-08-13 ENCOUNTER — Other Ambulatory Visit: Payer: Self-pay

## 2016-08-13 ENCOUNTER — Other Ambulatory Visit
Admission: RE | Admit: 2016-08-13 | Discharge: 2016-08-13 | Disposition: A | Discharge status: Home / Self Care | Payer: PPO | Source: Ambulatory Visit | Attending: Pulmonary Disease | Admitting: Pulmonary Disease

## 2016-08-13 VITALS — BP 124/76 | HR 51 | Ht 74.0 in | Wt 239.0 lb

## 2016-08-13 DIAGNOSIS — G4733 Obstructive sleep apnea (adult) (pediatric): Secondary | ICD-10-CM

## 2016-08-13 DIAGNOSIS — R918 Other nonspecific abnormal finding of lung field: Secondary | ICD-10-CM | POA: Insufficient documentation

## 2016-08-13 DIAGNOSIS — G4761 Periodic limb movement disorder: Secondary | ICD-10-CM | POA: Insufficient documentation

## 2016-08-13 DIAGNOSIS — R911 Solitary pulmonary nodule: Secondary | ICD-10-CM | POA: Diagnosis not present

## 2016-08-13 LAB — FERRITIN: FERRITIN: 182 ng/mL (ref 24–336)

## 2016-08-13 MED ORDER — DILTIAZEM HCL ER COATED BEADS 120 MG PO CP24: 120.0000 mg | ORAL_CAPSULE | Freq: Every day | ORAL | 2 refills | Status: DC

## 2016-08-13 MED ORDER — RANITIDINE HCL 150 MG PO TABS: 150.0000 mg | ORAL_TABLET | Freq: Every day | ORAL | 2 refills | Status: DC

## 2016-08-13 NOTE — Progress Notes (Signed)
PULMONARY OFFICE FOLLOW UP NOTE  Requesting MD/Service: Lada Date of initial consultation: 05/29/16 Reason for consultation:  Polycythemia  PT PROFILE: 83 y.o. M never smoker referred for evaluation of polycythemia and nocturnal hypoxemia  DATA: Overnight oximetry (RA) 04/10/16: Lowest SpO2 79%, Avg 25 desaturation events per hour Overnight oximetry (2 LPM [place]) 06/04/16: 161 desaturation events, 24 per hour. Lowest SPO2 82% Polysomnogram 07/10/16: Severe OSA with AHI of 40 per hour CPAP titration 07/31/16:Recommendation of AutoSet CPAP 10-20 cm H2O. PLM index 165.5 with arousal index of 4.9. Recommended check of ferritin level  SUBJ: He returns today to review the results of the sleep studies performed. He continues to have minimal daytime hypersomnolence. He has no new complaints.  Vitals:   08/13/16 0841 08/13/16 0842  BP:  124/76  Pulse:  (!) 51  SpO2:  96%  Weight: 239 lb (108.4 kg)   Height: 6\' 2"  (1.88 m)      EXAM:   Gen: WDWN in NAD HEENT: NCAT, sclerae white, oropharynx normal Neck: NO LAN, no JVD noted Lungs: full BS, normal percussion, no adventitious sounds Cardiovascular: Reg rate, normal rhythm, no M noted Abdomen: Soft, NT, +BS Ext: no C/C/E, varicosities in both lower extremities Neuro: PERRL, EOMI, motor/sensory grossly intact Skin: No lesions noted   DATA:   BMP Latest Ref Rng & Units 04/03/2016 09/30/2015 04/28/2015  Glucose 65 - 99 mg/dL 97 96 161(W101(H)  BUN 7 - 25 mg/dL 14 13 17   Creatinine 0.70 - 1.11 mg/dL 9.60(A1.23(H) 5.40(J1.24(H) 8.111.11  BUN/Creat Ratio 10 - 22 - - -  Sodium 135 - 146 mmol/L 140 140 140  Potassium 3.5 - 5.3 mmol/L 4.5 4.6 3.8  Chloride 98 - 110 mmol/L 106 106 109  CO2 20 - 31 mmol/L 27 23 25   Calcium 8.6 - 10.3 mg/dL 9.1 9.2 9.1(Y8.8(L)    CBC Latest Ref Rng & Units 04/03/2016 09/30/2015 04/28/2015  WBC 3.8 - 10.8 K/uL 5.5 6.1 6.1  Hemoglobin 13.2 - 17.1 g/dL 17.4(H) 16.7 17.1  Hematocrit 38.5 - 50.0 % 50.1(H) 47.7 50.1  Platelets 140 - 400 K/uL  216 212 193    CXR:  08/13/16 - no acute cardiac or pulmonary disease  IMPRESSION:     ICD-9-CM ICD-10-CM   1. OSA (obstructive sleep apnea) 327.23 G47.33 Ambulatory Referral for DME  2. PLMD (periodic limb movement disorder) 327.51 G47.61 Ferritin    PLAN:  Initiate CPAP AutoSet 10-20 cm H2O Check ferritin level today. If less than 45 will initiate iron supplementation. If greater than 45 will reassess on next visit to determine whether this needs to be treated specifically Follow-up in 2-3 months   Billy Fischeravid Mane Consolo, MD PCCM service Mobile [phone] Pager [phone] 08/13/2016 9:02 AM

## 2016-08-13 NOTE — Addendum Note (Signed)
Addended by: Alease FrameARTER, SONYA S on: 08/13/2016 09:24 AM   Modules accepted: Orders

## 2016-08-13 NOTE — Telephone Encounter (Signed)
Requesting 90 day

## 2016-08-14 DIAGNOSIS — G4761 Periodic limb movement disorder: Secondary | ICD-10-CM | POA: Diagnosis not present

## 2016-08-14 DIAGNOSIS — G4733 Obstructive sleep apnea (adult) (pediatric): Secondary | ICD-10-CM | POA: Diagnosis not present

## 2016-08-22 DIAGNOSIS — G4734 Idiopathic sleep related nonobstructive alveolar hypoventilation: Secondary | ICD-10-CM | POA: Diagnosis not present

## 2016-09-11 ENCOUNTER — Telehealth: Payer: Self-pay | Admitting: Family Medicine

## 2016-09-11 NOTE — Telephone Encounter (Signed)
Pt requesting return call would not state what was going on. [phone]

## 2016-09-11 NOTE — Telephone Encounter (Signed)
Pt asked question about is CPAP machine. He wanted to know which machine would work best for him. I ask the pt who does he see for his sleep apena ; pt stated Dr Sung AmabileSimonds. I suggest to call his office and ask to speak with his nurse to ask Dr. Lacy DuverneySimounds to see which one he recommends what well be best for him. Pt agreed.

## 2016-09-12 DIAGNOSIS — H04223 Epiphora due to insufficient drainage, bilateral lacrimal glands: Secondary | ICD-10-CM | POA: Diagnosis not present

## 2016-09-14 DIAGNOSIS — G4733 Obstructive sleep apnea (adult) (pediatric): Secondary | ICD-10-CM | POA: Diagnosis not present

## 2016-09-14 DIAGNOSIS — G4761 Periodic limb movement disorder: Secondary | ICD-10-CM | POA: Diagnosis not present

## 2016-09-22 DIAGNOSIS — G4734 Idiopathic sleep related nonobstructive alveolar hypoventilation: Secondary | ICD-10-CM | POA: Diagnosis not present

## 2016-09-28 ENCOUNTER — Ambulatory Visit: Payer: PPO | Admitting: Family Medicine

## 2016-09-30 ENCOUNTER — Encounter: Payer: Self-pay | Admitting: Family Medicine

## 2016-09-30 ENCOUNTER — Other Ambulatory Visit: Payer: Self-pay | Admitting: Family Medicine

## 2016-09-30 DIAGNOSIS — E782 Mixed hyperlipidemia: Secondary | ICD-10-CM

## 2016-09-30 NOTE — Telephone Encounter (Signed)
Last Lipid panel at goal. Patient has appointment on 10/05/2016, 10 tablets provided so pt will have enough until this appt. Further refills to be provided at follow up.

## 2016-10-05 ENCOUNTER — Encounter: Payer: Self-pay | Admitting: Family Medicine

## 2016-10-05 ENCOUNTER — Ambulatory Visit (INDEPENDENT_AMBULATORY_CARE_PROVIDER_SITE_OTHER): Payer: PPO | Admitting: Family Medicine

## 2016-10-05 DIAGNOSIS — N183 Chronic kidney disease, stage 3 unspecified: Secondary | ICD-10-CM

## 2016-10-05 DIAGNOSIS — I10 Essential (primary) hypertension: Secondary | ICD-10-CM

## 2016-10-05 DIAGNOSIS — R718 Other abnormality of red blood cells: Secondary | ICD-10-CM

## 2016-10-05 DIAGNOSIS — F3341 Major depressive disorder, recurrent, in partial remission: Secondary | ICD-10-CM | POA: Diagnosis not present

## 2016-10-05 DIAGNOSIS — I63131 Cerebral infarction due to embolism of right carotid artery: Secondary | ICD-10-CM

## 2016-10-05 DIAGNOSIS — R269 Unspecified abnormalities of gait and mobility: Secondary | ICD-10-CM

## 2016-10-05 DIAGNOSIS — E6609 Other obesity due to excess calories: Secondary | ICD-10-CM

## 2016-10-05 DIAGNOSIS — E782 Mixed hyperlipidemia: Secondary | ICD-10-CM | POA: Diagnosis not present

## 2016-10-05 DIAGNOSIS — K219 Gastro-esophageal reflux disease without esophagitis: Secondary | ICD-10-CM

## 2016-10-05 DIAGNOSIS — E8881 Metabolic syndrome: Secondary | ICD-10-CM

## 2016-10-05 DIAGNOSIS — I499 Cardiac arrhythmia, unspecified: Secondary | ICD-10-CM | POA: Diagnosis not present

## 2016-10-05 DIAGNOSIS — G8194 Hemiplegia, unspecified affecting left nondominant side: Secondary | ICD-10-CM

## 2016-10-05 DIAGNOSIS — Z6831 Body mass index (BMI) 31.0-31.9, adult: Secondary | ICD-10-CM

## 2016-10-05 DIAGNOSIS — Z5181 Encounter for therapeutic drug level monitoring: Secondary | ICD-10-CM

## 2016-10-05 NOTE — Assessment & Plan Note (Signed)
No problems afterwards; continue aggrenox

## 2016-10-05 NOTE — Progress Notes (Signed)
BP 126/72   Pulse 68   Temp 98 F (36.7 C) (Oral)   Resp 14   Ht 6\' 2"  (1.88 m)   Wt 246 lb (111.6 kg)   SpO2 92%   BMI 31.58 kg/m    Subjective:    Patient ID: Jeffery Campbell, male    DOB: May 01, 1928, 83 y.o.   MRN: [id number]  HPI: Jeffery Campbell is a 83 y.o. male  Chief Complaint  Patient presents with  . Follow-up    6 months    HPI Patient is here for f/u No medical excitement since last visit High cholesterol; taking atrovastatin and omega 3; no muscle aches; tries to watch diet Hx of CVA; left sided hemiparesis, abnormal gait; feels unsteady; would like to see PT; no new sx; on aggrenox; no excessive bleeding on medicine No palpitations, no rapid heartbeats HTN; well-controlled; no added salt GERD; well-controlled on the ranitidine; no real trigger foods; eats pizza, doesn't bother him; no blood in the stool Vitamin B12; taking supplement energy is good Had a bowl of cereal and coffee this morning Elevated H/H; referred to pulmonologist; see lab note from January 2018; he uses the nasal cannula at night, not using anything else (CPAP); he says he doesn't need that; seeing Dr. Sung Amabile;  CKD stage 3; no NSAIDs; good water drinker Depression; he wishes to continue; doing well Varicose veins; not bothersome  Depression screen [facility] 2/9 10/05/2016 03/30/2016 01/10/2016 09/30/2015 01/20/2015  Decreased Interest 0 0 0 0 0  Down, Depressed, Hopeless 0 0 0 0 0  PHQ - 2 Score 0 0 0 0 0  Altered sleeping - - - - -  Tired, decreased energy - - - - -  Change in appetite - - - - -  Feeling bad or failure about yourself  - - - - -  Trouble concentrating - - - - -  Moving slowly or fidgety/restless - - - - -  Suicidal thoughts - - - - -  PHQ-9 Score - - - - -    Relevant past medical, surgical, family and social history reviewed Past Medical History:  Diagnosis Date  . Arrhythmia   . Cough    CHRONIC  . Depression   . Disturbance of sleep   . Diverticulitis   . GERD  (gastroesophageal reflux disease)   . Hemorrhoids   . Hypertension   . OA (osteoarthritis)   . Pure hypercholesterolemia   . Stroke [facility])    Past Surgical History:  Procedure Laterality Date  . CATARACT EXTRACTION W/PHACO Left 02/14/2016   Procedure: CATARACT EXTRACTION PHACO AND INTRAOCULAR LENS PLACEMENT (IOC);  Surgeon: Galen Manila, MD;  Location: [facility] ORS;  Service: Ophthalmology;  Laterality: Left;  [place] 53.7 AP% 21.5 CDE 11.58 FLUID PACK LOT # [id number]  . COLONOSCOPY    . KNEE ARTHROSCOPY    . TRANSURETHRAL RESECTION OF PROSTATE     Family History  Problem Relation Age of Onset  . Hypertension Son        Posey Rea of parents health.   Social History   Social History  . Marital status: Divorced    Spouse name: N/A  . Number of children: 2  . Years of education: 12   Occupational History  . Retired    Social History Main Topics  . Smoking status: Never Smoker  . Smokeless tobacco: Never Used  . Alcohol use No  . Drug use: No  . Sexual activity: Not Currently   Other Topics Concern  .  Not on file   Social History Narrative   Lives at home alone.   Writes left-handed - does everything else with right-hand.   No caffeine use.    Interim medical history since last visit reviewed. Allergies and medications reviewed  Review of Systems Per HPI unless specifically indicated above     Objective:    BP 126/72   Pulse 68   Temp 98 F (36.7 C) (Oral)   Resp 14   Ht 6\' 2"  (1.88 m)   Wt 246 lb (111.6 kg)   SpO2 92%   BMI 31.58 kg/m   Wt Readings from Last 3 Encounters:  10/05/16 246 lb (111.6 kg)  08/13/16 239 lb (108.4 kg)  05/29/16 242 lb (109.8 kg)    Physical Exam  Constitutional: He appears well-developed and well-nourished. No distress.  HENT:  Head: Normocephalic and atraumatic.  Eyes: EOM are normal. No scleral icterus.  Neck: No JVD present. Carotid bruit is not present. No thyromegaly present.  Cardiovascular: Normal rate and regular  rhythm.   Pulmonary/Chest: Effort normal and breath sounds normal.  Abdominal: Soft. Bowel sounds are normal. He exhibits no distension.  Musculoskeletal: He exhibits no edema.  Neurological: He is alert. Gait abnormal. Coordination normal.  Left sided hemiparesis; slow deliberate gait  Skin: Skin is warm and dry. No bruising and no ecchymosis noted. No pallor.  Psychiatric: He has a normal mood and affect. His behavior is normal. Judgment and thought content normal.    Results for orders placed or performed during the hospital encounter of 08/13/16  Ferritin  Result Value Ref Range   Ferritin 182 24 - 336 ng/mL      Assessment & Plan:   Problem List Items Addressed This Visit      Cardiovascular and Mediastinum   Essential hypertension    Excellent control; avoid excess salt      Cerebral vascular accident (HCC)    No problems afterwards; continue aggrenox      Arrhythmia, ventricular    Doing well on CCB; asymptomatic        Digestive   Gastro-esophageal reflux disease without esophagitis    Well-controlled on H2 blocker; no red flags        Nervous and Auditory   Left hemiparesis (HCC)    Abnormal gait; refer to PT      Relevant Orders   Ambulatory referral to Physical Therapy     Genitourinary   CKD (chronic kidney disease) stage 3, GFR 30-59 ml/min    Check renal function; stay hydrated        Other   Obesity    Try to lose 10 pounds      Medication monitoring encounter    Check labs      Relevant Orders   COMPLETE METABOLIC PANEL WITH GFR   HLD (hyperlipidemia)    Avoid saturated fats; walking; continue omega-3 and statin      Relevant Orders   Lipid panel   Elevated hematocrit    Related to OSA; check CBC today      Relevant Orders   CBC with Differential/Platelet   Dysmetabolic syndrome    Check glucose, lipids      Relevant Orders   Hemoglobin A1c   Lipid panel   Clinical depression    Continue medicine      Abnormal  gait    Refer to PT      Relevant Orders   Ambulatory referral to Physical Therapy  Follow up plan: Return in about 6 months (around 04/07/2017) for twenty minute follow-up with fasting labs.  An after-visit summary was printed and given to the patient at check-out.  Please see the patient instructions which may contain other information and recommendations beyond what is mentioned above in the assessment and plan.  Meds ordered this encounter  Medications  . Cyanocobalamin (B-12) 3000 MCG SUBL    Sig: Place 1 each under the tongue daily.    Dispense:  30 tablet    Refill:  11    Orders Placed This Encounter  Procedures  . CBC with Differential/Platelet  . COMPLETE METABOLIC PANEL WITH GFR  . Hemoglobin A1c  . Lipid panel  . Ambulatory referral to Physical Therapy

## 2016-10-05 NOTE — Assessment & Plan Note (Signed)
Abnormal gait; refer to PT

## 2016-10-05 NOTE — Assessment & Plan Note (Signed)
Avoid saturated fats; walking; continue omega-3 and statin

## 2016-10-05 NOTE — Assessment & Plan Note (Signed)
Check renal function; stay hydrated

## 2016-10-05 NOTE — Assessment & Plan Note (Signed)
Refer to PT

## 2016-10-05 NOTE — Assessment & Plan Note (Signed)
Related to OSA; check CBC today

## 2016-10-05 NOTE — Assessment & Plan Note (Signed)
Check glucose, lipids 

## 2016-10-05 NOTE — Assessment & Plan Note (Signed)
Try to lose 10 pounds 

## 2016-10-05 NOTE — Assessment & Plan Note (Signed)
Excellent control; avoid excess salt 

## 2016-10-05 NOTE — Assessment & Plan Note (Signed)
Continue medicine 

## 2016-10-05 NOTE — Assessment & Plan Note (Signed)
Check labs 

## 2016-10-05 NOTE — Patient Instructions (Signed)
Check out the information at familydoctor.org entitled "Nutrition for Weight Loss: What You Need to Know about Fad Diets" Try to lose between 1-2 pounds per week by taking in fewer calories and burning off more calories You can succeed by limiting portions, limiting foods dense in calories and fat, becoming more active, and drinking 8 glasses of water a day (64 ounces) Don't skip meals, especially breakfast, as skipping meals may alter your metabolism Do not use over-the-counter weight loss pills or gimmicks that claim rapid weight loss A healthy BMI (or body mass index) is between 18.5 and 24.9 You can calculate your ideal BMI at the NIH website http://www.nhlbi.nih.gov/health/educational/lose_wt/BMI/bmicalc.htm Try to follow the DASH guidelines (DASH stands for Dietary Approaches to Stop Hypertension) Try to limit the sodium in your diet.  Ideally, consume less than 1.5 grams (less than 1,500mg) per day. Do not add salt when cooking or at the table.  Check the sodium amount on labels when shopping, and choose items lower in sodium when given a choice. Avoid or limit foods that already contain a lot of sodium. Eat a diet rich in fruits and vegetables and whole grains. 

## 2016-10-05 NOTE — Assessment & Plan Note (Signed)
Doing well on CCB; asymptomatic

## 2016-10-05 NOTE — Assessment & Plan Note (Signed)
Well-controlled on H2 blocker; no red flags

## 2016-10-12 DIAGNOSIS — R718 Other abnormality of red blood cells: Secondary | ICD-10-CM | POA: Diagnosis not present

## 2016-10-12 DIAGNOSIS — Z5181 Encounter for therapeutic drug level monitoring: Secondary | ICD-10-CM | POA: Diagnosis not present

## 2016-10-12 DIAGNOSIS — R739 Hyperglycemia, unspecified: Secondary | ICD-10-CM | POA: Diagnosis not present

## 2016-10-12 DIAGNOSIS — E8881 Metabolic syndrome: Secondary | ICD-10-CM | POA: Diagnosis not present

## 2016-10-12 DIAGNOSIS — E782 Mixed hyperlipidemia: Secondary | ICD-10-CM | POA: Diagnosis not present

## 2016-10-12 LAB — COMPLETE METABOLIC PANEL WITH GFR
ALK PHOS: 40 U/L (ref 40–115)
ALT: 10 U/L (ref 9–46)
AST: 12 U/L (ref 10–35)
Albumin: 4 g/dL (ref 3.6–5.1)
BILIRUBIN TOTAL: 1.3 mg/dL — AB (ref 0.2–1.2)
BUN: 14 mg/dL (ref 7–25)
CALCIUM: 8.9 mg/dL (ref 8.6–10.3)
CO2: 23 mmol/L (ref 20–31)
CREATININE: 1.18 mg/dL — AB (ref 0.70–1.11)
Chloride: 105 mmol/L (ref 98–110)
GFR, EST AFRICAN AMERICAN: 65 mL/min (ref >=60)
GFR, Est Non African American: 56 mL/min — ABNORMAL LOW (ref >=60)
Glucose, Bld: 96 mg/dL (ref 65–99)
Potassium: 4.4 mmol/L (ref 3.5–5.3)
Sodium: 137 mmol/L (ref 135–146)
TOTAL PROTEIN: 6.4 g/dL (ref 6.1–8.1)

## 2016-10-12 LAB — CBC WITH DIFFERENTIAL/PLATELET
Basophils Absolute: 0 {cells}/uL (ref 0–200)
Basophils Relative: 0 %
Eosinophils Absolute: 94 {cells}/uL (ref 15–500)
Eosinophils Relative: 2 %
HCT: 48.9 % (ref 38.5–50.0)
Hemoglobin: 16.9 g/dL (ref 13.2–17.1)
Lymphocytes Relative: 22 %
Lymphs Abs: 1034 {cells}/uL (ref 850–3900)
MCH: 32.9 pg (ref 27.0–33.0)
MCHC: 34.6 g/dL (ref 32.0–36.0)
MCV: 95.1 fL (ref 80.0–100.0)
MPV: 10.7 fL (ref 7.5–12.5)
Monocytes Absolute: 564 {cells}/uL (ref 200–950)
Monocytes Relative: 12 %
Neutro Abs: 3008 {cells}/uL (ref 1500–7800)
Neutrophils Relative %: 64 %
Platelets: 212 10*3/uL (ref 140–400)
RBC: 5.14 MIL/uL (ref 4.20–5.80)
RDW: 13.3 % (ref 11.0–15.0)
WBC: 4.7 10*3/uL (ref 3.8–10.8)

## 2016-10-12 LAB — LIPID PANEL
Cholesterol: 116 mg/dL (ref <200)
HDL: 49 mg/dL (ref >40)
LDL Cholesterol: 58 mg/dL (ref <100)
Total CHOL/HDL Ratio: 2.4 Ratio (ref <5.0)
Triglycerides: 43 mg/dL (ref <150)
VLDL: 9 mg/dL (ref <30)

## 2016-10-13 LAB — HEMOGLOBIN A1C
HEMOGLOBIN A1C: 4.9 % (ref <5.7)
MEAN PLASMA GLUCOSE: 94 mg/dL

## 2016-10-14 DIAGNOSIS — G4733 Obstructive sleep apnea (adult) (pediatric): Secondary | ICD-10-CM | POA: Diagnosis not present

## 2016-10-14 DIAGNOSIS — G4761 Periodic limb movement disorder: Secondary | ICD-10-CM | POA: Diagnosis not present

## 2016-10-17 ENCOUNTER — Encounter: Payer: Self-pay | Admitting: Family Medicine

## 2016-10-17 DIAGNOSIS — R739 Hyperglycemia, unspecified: Secondary | ICD-10-CM | POA: Insufficient documentation

## 2016-10-18 ENCOUNTER — Other Ambulatory Visit: Payer: Self-pay | Admitting: Family Medicine

## 2016-10-18 DIAGNOSIS — E782 Mixed hyperlipidemia: Secondary | ICD-10-CM

## 2016-10-19 NOTE — Telephone Encounter (Signed)
Last sgpt and lipids reviewed; Rx approved 

## 2016-10-22 DIAGNOSIS — G4734 Idiopathic sleep related nonobstructive alveolar hypoventilation: Secondary | ICD-10-CM | POA: Diagnosis not present

## 2016-10-25 ENCOUNTER — Other Ambulatory Visit: Payer: Self-pay | Admitting: Family Medicine

## 2016-10-25 NOTE — Progress Notes (Signed)
I received a request for aggrenox from an [facility] [place], [place] I called patient He did not request this Does not use that pharmacy He does not need refills [facility]ses [facility] I am DENYING the request from [facility] and encouraged patient to check his Medicare statements

## 2016-11-08 ENCOUNTER — Other Ambulatory Visit: Payer: Self-pay | Admitting: Family Medicine

## 2016-11-15 ENCOUNTER — Encounter: Payer: Self-pay | Admitting: Pulmonary Disease

## 2016-11-15 ENCOUNTER — Ambulatory Visit (INDEPENDENT_AMBULATORY_CARE_PROVIDER_SITE_OTHER): Payer: PPO | Admitting: Pulmonary Disease

## 2016-11-15 VITALS — BP 142/90 | HR 74 | Ht 74.0 in | Wt 238.0 lb

## 2016-11-15 DIAGNOSIS — D751 Secondary polycythemia: Secondary | ICD-10-CM | POA: Diagnosis not present

## 2016-11-15 DIAGNOSIS — G4733 Obstructive sleep apnea (adult) (pediatric): Secondary | ICD-10-CM | POA: Diagnosis not present

## 2016-11-15 DIAGNOSIS — G4734 Idiopathic sleep related nonobstructive alveolar hypoventilation: Secondary | ICD-10-CM

## 2016-11-15 NOTE — Patient Instructions (Signed)
We will ask [employer] to work with you on finding a better CPAP mask It would be ideal that you wear CPAP as prescribed At the very least, you should wear nocturnal oxygen EVERY NIGHT  Follow up in 3-4 months

## 2016-11-17 NOTE — Progress Notes (Signed)
PULMONARY OFFICE FOLLOW UP NOTE  Requesting MD/Service: Lada Date of initial consultation: 05/29/16 Reason for consultation:  Polycythemia  PT PROFILE: 83 y.o. M never smoker referred for evaluation of polycythemia and nocturnal hypoxemia  DATA: Overnight oximetry (RA) 04/10/16: Lowest SpO2 79%, Avg 25 desaturation events per hour Overnight oximetry (2 LPM [place]) 06/04/16: 161 desaturation events, 24 per hour. Lowest SPO2 82% Polysomnogram 07/10/16: Severe OSA with AHI of 40 per hour CPAP titration 07/31/16:Recommendation of AutoSet CPAP 10-20 cm H2O. PLM index 165.5 with arousal index of 4.9. Recommended check of ferritin level  SUBJ: Routine reevaluation for obstructive sleep apnea. He has no new complaints. He is not wearing CPAP because he finds it uncomfortable and difficult to tolerate. He has a full face mask at home. He denies daytime hypesomnolence.  Vitals:   11/15/16 0821 11/15/16 0822  BP:  (!) 142/90  Pulse:  74  SpO2:  95%  Weight: 238 lb (108 kg)   Height: 6\' 2"  (1.88 m)      EXAM:   Gen: NAD HEENT WNL Neck: No JVD noted Lungs: No adventitious sounds Cardiovascular: Regular, no murmur Abdomen: Soft, NT, +BS Ext: no C/C/E, varicosities present in both lower extremities Neuro: Grossly intact   DATA:   BMP Latest Ref Rng & Units 10/12/2016 04/03/2016 09/30/2015  Glucose 65 - 99 mg/dL 96 97 96  BUN 7 - 25 mg/dL 14 14 13   Creatinine 0.70 - 1.11 mg/dL 3.01(T) 1.43(O) 8.87(N)  BUN/Creat Ratio 10 - 22 - - -  Sodium 135 - 146 mmol/L 137 140 140  Potassium 3.5 - 5.3 mmol/L 4.4 4.5 4.6  Chloride 98 - 110 mmol/L 105 106 106  CO2 20 - 31 mmol/L 23 27 23   Calcium 8.6 - 10.3 mg/dL 8.9 9.1 9.2    CBC Latest Ref Rng & Units 10/12/2016 04/03/2016 09/30/2015  WBC 3.8 - 10.8 K/uL 4.7 5.5 6.1  Hemoglobin 13.2 - 17.1 g/dL 79.7 17.4(H) 16.7  Hematocrit 38.5 - 50.0 % 48.9 50.1(H) 47.7  Platelets 140 - 400 K/uL 212 216 212    CXR:  NNF  IMPRESSION:     ICD-10-CM   1. OSA  (obstructive sleep apnea) G47.33 AMB REFERRAL FOR DME  2. Nocturnal hypoxemia G47.34   3. Polycythemia D75.1     PLAN:  We will ask [employer] to work with on finding a better CPAP mask - He might tolerate a nasal mask better  I've emphasized the importance of wearing CPAP despite his minimal symptoms of daytime sleepiness.   At a minimum, he should wear nocturnal oxygen EVERY NIGHT  Follow up in 3-4 months    Billy Fischer, MD PCCM service Mobile [phone] Pager [phone] 11/17/2016 12:54 PM

## 2016-11-20 ENCOUNTER — Other Ambulatory Visit: Payer: Self-pay | Admitting: Family Medicine

## 2016-11-20 NOTE — Telephone Encounter (Signed)
Pt needs refill on Aggrenox to be sent to [facility] in [place].

## 2016-11-21 MED ORDER — ASPIRIN-DIPYRIDAMOLE ER 25-200 MG PO CP12: 1.0000 | ORAL_CAPSULE | Freq: Two times a day (BID) | ORAL | 5 refills | Status: DC

## 2016-11-21 NOTE — Telephone Encounter (Signed)
Molli Knock, Rx sent as requested to an [facility] in [place], [place]

## 2016-11-21 NOTE — Telephone Encounter (Signed)
Patient states he does need this rx and this is the pharmacy he uses.

## 2016-11-21 NOTE — Addendum Note (Signed)
Addended by: LADA, Janit Bern on: 11/21/2016 12:16 PM   Modules accepted: Orders

## 2016-11-22 DIAGNOSIS — G4734 Idiopathic sleep related nonobstructive alveolar hypoventilation: Secondary | ICD-10-CM | POA: Diagnosis not present

## 2016-11-29 ENCOUNTER — Telehealth: Payer: Self-pay | Admitting: Emergency Medicine

## 2016-11-29 MED ORDER — CLOPIDOGREL BISULFATE 75 MG PO TABS: 75.0000 mg | ORAL_TABLET | Freq: Every day | ORAL | 3 refills | Status: DC

## 2016-11-29 NOTE — Telephone Encounter (Signed)
Yes, thank you If he was only on the Aggrenox to save money, we can switch him to generic Plavix now Verify no allergy or adverse reaction to Plavix When he finishes the last Aggrenox, start the Plavix

## 2016-11-29 NOTE — Telephone Encounter (Signed)
Patient was [facility] a [facility]. It was costing his $300 at local pharamcy but $35 through the [facility]. As of October 31st they willno longer be able to use [facility] any more. Can  You please look at other options for medication. I have spoken to [facility] and they gave me a couple of suggestions, but it is all income based.

## 2016-11-29 NOTE — Telephone Encounter (Signed)
Left detailed voicemail

## 2016-11-29 NOTE — Telephone Encounter (Signed)
Patient notified no problems with plavix.

## 2016-12-17 ENCOUNTER — Telehealth: Payer: Self-pay

## 2016-12-17 NOTE — Telephone Encounter (Signed)
Called and canceled and D/C patients aggrenox at [facility].  Spoke with [employer]

## 2016-12-22 ENCOUNTER — Other Ambulatory Visit: Payer: Self-pay | Admitting: Family Medicine

## 2016-12-23 DIAGNOSIS — G4734 Idiopathic sleep related nonobstructive alveolar hypoventilation: Secondary | ICD-10-CM | POA: Diagnosis not present

## 2016-12-26 ENCOUNTER — Other Ambulatory Visit: Payer: Self-pay | Admitting: Family Medicine

## 2017-01-17 ENCOUNTER — Other Ambulatory Visit: Payer: Self-pay | Admitting: Family Medicine

## 2017-01-17 DIAGNOSIS — E782 Mixed hyperlipidemia: Secondary | ICD-10-CM

## 2017-01-18 NOTE — Telephone Encounter (Signed)
6 month supply of statin sent to pharmacy on 10/19/16; too soon for refills

## 2017-02-09 ENCOUNTER — Other Ambulatory Visit: Payer: Self-pay | Admitting: Family Medicine

## 2017-03-06 ENCOUNTER — Ambulatory Visit: Payer: Self-pay | Admitting: Hematology

## 2017-03-06 NOTE — Telephone Encounter (Signed)
  No pain, no fever, patient states it only happened once last night while he was in bed.  Patient admits to drinking regular milk when the Dr. Catalina Pizzaold him to drink a "special" type of milk.  I advised of home care instructions.  I advised patient to call back if he developed N/V/D, pain in the area, soreness, fever.  Patient verbalizes understanding Reason for Disposition . [1] MILD-MODERATE pain AND [2] comes and goes (cramps)  Answer Assessment - Initial Assessment Questions 1. LOCATION: "Where does it hurt?"      Left side radiates around to back 2. RADIATION: "Does the pain shoot anywhere else?" (e.g., chest, back)     back 3. ONSET: "When did the pain begin?" (Minutes, hours or days ago)     03/05/2017 4. SUDDEN: "Gradual or sudden onset?"    Sudden 5. PATTERN "Does the pain come and go, or is it constant?"    - If constant: "Is it getting better, staying the same, or worsening?"      (Note: Constant means the pain never goes away completely; most serious pain is constant and it progresses)     - If intermittent: "How long does it last?" "Do you have pain now?"     (Note: Intermittent means the pain goes away completely between bouts)     Intermittent 6. SEVERITY: "How bad is the pain?"  (e.g., Scale 1-10; mild, moderate, or severe)    - MILD (1-3): doesn't interfere with normal activities, abdomen soft and not tender to touch     - MODERATE (4-7): interferes with normal activities or awakens from sleep, tender to touch     - SEVERE (8-10): excruciating pain, doubled over, unable to do any normal activities     10 but doesn't last long 7. RECURRENT SYMPTOM: "Have you ever had this type of abdominal pain before?" If so, ask: "When was the last time?" and "What happened that time?"     No 8. CAUSE: "What do you think is causing the abdominal pain?"     unsure 9. RELIEVING/AGGRAVATING FACTORS: "What makes it better or worse?" (e.g., movement, antacids, bowel movement)     none 10. OTHER  SYMPTOMS: "Has there been any vomiting, diarrhea, constipation, or urine problems?"      no  Protocols used: ABDOMINAL PAIN - MALE-A-AH

## 2017-03-20 ENCOUNTER — Other Ambulatory Visit: Payer: Self-pay | Admitting: Family Medicine

## 2017-03-20 NOTE — Telephone Encounter (Signed)
Hytrin refill requested Denied Should have enough until March 2019

## 2017-03-21 ENCOUNTER — Emergency Department: Payer: PPO

## 2017-03-21 ENCOUNTER — Ambulatory Visit: Payer: PPO | Admitting: Family Medicine

## 2017-03-21 ENCOUNTER — Encounter: Payer: Self-pay | Admitting: Family Medicine

## 2017-03-21 ENCOUNTER — Ambulatory Visit: Payer: Self-pay

## 2017-03-21 ENCOUNTER — Emergency Department
Admission: EM | Admit: 2017-03-21 | Discharge: 2017-03-21 | Disposition: A | Discharge status: Home / Self Care | Payer: PPO | Attending: Emergency Medicine | Admitting: Emergency Medicine

## 2017-03-21 VITALS — BP 116/58 | HR 83 | Temp 97.6°F | Resp 14 | Wt 239.4 lb

## 2017-03-21 DIAGNOSIS — I10 Essential (primary) hypertension: Secondary | ICD-10-CM

## 2017-03-21 DIAGNOSIS — R4781 Slurred speech: Secondary | ICD-10-CM | POA: Diagnosis not present

## 2017-03-21 DIAGNOSIS — N183 Chronic kidney disease, stage 3 (moderate): Secondary | ICD-10-CM | POA: Insufficient documentation

## 2017-03-21 DIAGNOSIS — Z7901 Long term (current) use of anticoagulants: Secondary | ICD-10-CM | POA: Insufficient documentation

## 2017-03-21 DIAGNOSIS — Z79899 Other long term (current) drug therapy: Secondary | ICD-10-CM | POA: Diagnosis not present

## 2017-03-21 DIAGNOSIS — Z8673 Personal history of transient ischemic attack (TIA), and cerebral infarction without residual deficits: Secondary | ICD-10-CM

## 2017-03-21 DIAGNOSIS — R29810 Facial weakness: Secondary | ICD-10-CM

## 2017-03-21 DIAGNOSIS — R42 Dizziness and giddiness: Secondary | ICD-10-CM

## 2017-03-21 DIAGNOSIS — R471 Dysarthria and anarthria: Secondary | ICD-10-CM | POA: Diagnosis not present

## 2017-03-21 DIAGNOSIS — I129 Hypertensive chronic kidney disease with stage 1 through stage 4 chronic kidney disease, or unspecified chronic kidney disease: Secondary | ICD-10-CM | POA: Diagnosis not present

## 2017-03-21 LAB — PROTIME-INR
INR: 1.02
Prothrombin Time: 13.3 seconds (ref 11.4–15.2)

## 2017-03-21 LAB — APTT: aPTT: 31 seconds (ref 24–36)

## 2017-03-21 LAB — CBC
HEMATOCRIT: 52.7 % — AB (ref 40.0–52.0)
Hemoglobin: 17.8 g/dL (ref 13.0–18.0)
MCH: 31.6 pg (ref 26.0–34.0)
MCHC: 33.8 g/dL (ref 32.0–36.0)
MCV: 93.5 fL (ref 80.0–100.0)
Platelets: 199 10*3/uL (ref 150–440)
RBC: 5.64 MIL/uL (ref 4.40–5.90)
RDW: 12.6 % (ref 11.5–14.5)
WBC: 5.8 10*3/uL (ref 3.8–10.6)

## 2017-03-21 LAB — URINALYSIS, COMPLETE (UACMP) WITH MICROSCOPIC
BACTERIA UA: NONE SEEN
BILIRUBIN URINE: NEGATIVE
Glucose, UA: NEGATIVE mg/dL
Hgb urine dipstick: NEGATIVE
Ketones, ur: NEGATIVE mg/dL
Leukocytes, UA: NEGATIVE
NITRITE: NEGATIVE
PH: 6 (ref 5.0–8.0)
Protein, ur: NEGATIVE mg/dL
SPECIFIC GRAVITY, URINE: 1.009 (ref 1.005–1.030)
SQUAMOUS EPITHELIAL / LPF: NONE SEEN

## 2017-03-21 LAB — DIFFERENTIAL
BASOS PCT: 0 %
Basophils Absolute: 0 10*3/uL (ref 0–0.1)
EOS ABS: 0 10*3/uL (ref 0–0.7)
Eosinophils Relative: 1 %
LYMPHS ABS: 0.9 10*3/uL — AB (ref 1.0–3.6)
Lymphocytes Relative: 15 %
MONO ABS: 0.5 10*3/uL (ref 0.2–1.0)
MONOS PCT: 9 %
NEUTROS ABS: 4.3 10*3/uL (ref 1.4–6.5)
Neutrophils Relative %: 75 %

## 2017-03-21 LAB — COMPREHENSIVE METABOLIC PANEL
ALT: 15 U/L — AB (ref 17–63)
AST: 21 U/L (ref 15–41)
Albumin: 4.1 g/dL (ref 3.5–5.0)
Alkaline Phosphatase: 45 U/L (ref 38–126)
Anion gap: 10 (ref 5–15)
BILIRUBIN TOTAL: 1.6 mg/dL — AB (ref 0.3–1.2)
BUN: 14 mg/dL (ref 6–20)
CHLORIDE: 107 mmol/L (ref 101–111)
CO2: 21 mmol/L — ABNORMAL LOW (ref 22–32)
CREATININE: 1.11 mg/dL (ref 0.61–1.24)
Calcium: 8.9 mg/dL (ref 8.9–10.3)
GFR, EST NON AFRICAN AMERICAN: 58 mL/min — AB (ref >60)
Glucose, Bld: 109 mg/dL — ABNORMAL HIGH (ref 65–99)
Potassium: 4 mmol/L (ref 3.5–5.1)
Sodium: 138 mmol/L (ref 135–145)
TOTAL PROTEIN: 7 g/dL (ref 6.5–8.1)

## 2017-03-21 LAB — GLUCOSE, CAPILLARY
GLUCOSE-CAPILLARY: 103 mg/dL — AB (ref 65–99)
GLUCOSE-CAPILLARY: 116 mg/dL — AB (ref 65–99)

## 2017-03-21 LAB — TROPONIN I

## 2017-03-21 NOTE — ED Triage Notes (Signed)
Pt arrived via POV from PCP's office with concern pt had CVA.  Pt has prior hx of CVA several years ago.  Last known well was 9pm last night, pt states he got up to the bathroom around 0300 and felt dizziness  Son states he talked to the patient around 0700 and noticed slurred speech.  Pt was at PCP's office and was noted pt to have facial droop. No facial droop present during assessment in triage.

## 2017-03-21 NOTE — Progress Notes (Signed)
Name: Jeffery Campbell   MRN: [id number]    DOB: Jun 28, 1928   Date:03/21/2017       Progress Note  Subjective  Chief Complaint  Chief Complaint  Patient presents with  . Hypertension    Pt states last night he had a dizziness spell. Had to lay in the bed for about 30 minutes and it stopped. pt denies any chest pain, blurred vision and or arm pains and headaches. Pt states this morning his son checked his Bp it was 200 on top.     HPI  PT presents with concern for blood pressure spike early this morning. Pt woke up around 0300 today and felt very dizzy, laid there for 15-3120minutes, felt better, so he did not seek emergency care.  His son came over at 0730 this morning and checked pt's BP and it read 200/90 (automatic home cuff); also notes that his son told him his speech sounded different today.  He had CVA in 08/19/2014 - caused some mild dyarthria and LEFT sided hemiparesis which pt states mostly resolved aside from a slight gait disturbance.  Takes Plavix and cardizem as prescribed.  He is prescribed meclizine to take PRN for dizziness, but he did not take it today.  BP is now low end of normal.  Denies weakness, confusion, speech difficulties (though he says his son noted some slurred speech this morning), headache, chest pain, shortness of breath, numbness or tingling, vision changes.  Endorses ongoing gait disturbance.  Patient Active Problem List   Diagnosis Date Noted  . Hyperglycemia 10/17/2016  . Abnormal gait 10/05/2016  . Hypoxia, sleep related 04/20/2016  . Elevated hematocrit 04/04/2016  . Medication monitoring encounter 03/30/2016  . Right flank pain 01/10/2016  . General unsteadiness 05/24/2015  . Allergic rhinitis due to pollen 11/12/2014  . Back pain, chronic 10/21/2014  . Benign paroxysmal positional nystagmus 10/21/2014  . Decreased libido 10/21/2014  . Clinical depression 10/21/2014  . Fatigue 10/21/2014  . Gastro-esophageal reflux disease without esophagitis  10/21/2014  . Dysmetabolic syndrome 10/21/2014  . Fungal infection of nail 10/21/2014  . Cerebral vascular accident (HCC) 10/21/2014  . Arrhythmia, ventricular 10/21/2014  . Thalamic infarct, acute (HCC)   . Left hemiparesis (HCC)   . HLD (hyperlipidemia) 08/19/2014  . CKD (chronic kidney disease) stage 3, GFR 30-59 ml/min (HCC) 08/19/2014  . Obesity   . Asymptomatic PVCs 05/27/2014  . Essential hypertension 04/05/2014  . Osteoarthrosis involving more than one site but not generalized 08/03/2008    Social History   Tobacco Use  . Smoking status: Never Smoker  . Smokeless tobacco: Never Used  Substance Use Topics  . Alcohol use: No    Alcohol/week: 0.0 oz     Current Outpatient Medications:  .  atorvastatin (LIPITOR) 20 MG tablet, TAKE 1 TABLET BY MOUTH EVERYDAY AT BEDTIME, Disp: 90 tablet, Rfl: 1 .  citalopram (CELEXA) 20 MG tablet, TAKE 1 TABLET BY MOUTH EVERY DAY, Disp: 90 tablet, Rfl: 1 .  clopidogrel (PLAVIX) 75 MG tablet, Take 1 tablet (75 mg total) by mouth daily. (stop aggrenox), Disp: 90 tablet, Rfl: 3 .  Cyanocobalamin (B-12) 3000 MCG SUBL, Place 1 each under the tongue daily., Disp: 30 tablet, Rfl: 11 .  diltiazem (CARDIZEM CD) 120 MG 24 hr capsule, Take 1 capsule (120 mg total) by mouth daily., Disp: 90 capsule, Rfl: 2 .  docusate sodium (COLACE) 100 MG capsule, Take 1 capsule (100 mg total) by mouth 2 (two) times daily. (Patient taking differently: Take 100 mg  by mouth daily as needed. ), Disp: 60 capsule, Rfl: 0 .  meclizine (ANTIVERT) 25 MG tablet, TAKE 1/2 TO 1 TABLET (12.5-25 MG TOTAL) BY MOUTH 3 (THREE) TIMES DAILY AS NEEDED FOR DIZZINESS., Disp: 30 tablet, Rfl: 2 .  ranitidine (ZANTAC) 150 MG tablet, Take 1 tablet (150 mg total) by mouth at bedtime. To protect stomach -- STOP lansoprazole, Disp: 90 tablet, Rfl: 2 .  terazosin (HYTRIN) 5 MG capsule, TAKE ONE CAPSULE BY MOUTH AT BEDTIME, Disp: 90 capsule, Rfl: 1 .  Omega 3 1200 MG CAPS, Take 1 capsule by mouth  daily. , Disp: , Rfl:   No Known Allergies  ROS  Constitutional: Negative for fever or weight change.  Respiratory: Negative for cough and shortness of breath.   Cardiovascular: Negative for chest pain or palpitations.  Gastrointestinal: Negative for abdominal pain, no bowel changes.  Musculoskeletal: Negative for gait problem or joint swelling.  Skin: Negative for rash.  Neurological: Negative for dizziness or headache.  No other specific complaints in a complete review of systems (except as listed in HPI above).  Objective  Vitals:   03/21/17 0853 03/21/17 0857  BP: (!) 122/54 (!) 116/58  Pulse: 83   Resp: 14   Temp: 97.6 F (36.4 C)   TempSrc: Oral   SpO2: 97%   Weight: 239 lb 6.4 oz (108.6 kg)    Body mass index is 30.74 kg/m.  Nursing Note and Vital Signs reviewed.  Physical Exam  Constitutional: Patient appears well-developed and well-nourished. Obese. No distress.  HEENT: head atraumatic, normocephalic, pupils equal and reactive to light, EOM's intact, TM's without erythema or bulging, no maxillary or frontal sinus pain on palpation, neck supple without lymphadenopathy, oropharynx pink and moist without exudate Cardiovascular: Normal rate, regular rhythm, S1/S2 present.  No murmur or rub heard. No BLE edema. Pulmonary/Chest: Effort normal and breath sounds clear. No respiratory distress or retractions. Psychiatric: Patient has a normal mood and affect. behavior is normal. Judgment and thought content normal. Musculoskeletal: Normal range of motion, no joint effusions. No gross deformities Neurological: he is alert and oriented to person, place, and time. RIGHT facial droop (very mild) noted along with mildly dysarthric speech. Coordination, sensation, and strength are normal. Gait disturbance noted (chronic issue). Skin: Skin is warm and dry. No rash noted. No erythema.  Psychiatric: Patient has a normal mood and affect. behavior is normal. Judgment and thought  content normal.  No results found for this or any previous visit (from the past 2160 hour(s)).   Assessment & Plan  1. Dizziness 2. History of CVA (cerebrovascular accident) 3. Essential hypertension 4. Facial droop 5. Dysarthria - Advised that due to history of CVA, sudden onset of symptoms, mild RIGHT sided facial droop and persistent dysarthric speech, he needs to present for Emergency Care. Discussed EMS transport with patient and with patient's son - both decline EMS and prefer to go by private vehicle.  [facility] first, and pt agrees to go directly to ER for further evaluation. - Awoke with symptoms at 0300 this morning- LSN was yesterday evening 03/20/2017.

## 2017-03-21 NOTE — Discharge Instructions (Signed)
You are evaluated for dizziness, and although no certain cause was found, your exam and evaluation are overall reassuring in the emergency department today.  We discussed this may been vertigo.  You may try over-the-counter meclizine if you have recurrent dizziness and room spinning.  Return to the emergency department for any new or worsening condition including one-sided weakness or numbness, confusion or altered mental status, dizziness passing out, chest pain, coordination problem, or any other symptoms concerning to you.

## 2017-03-21 NOTE — ED Notes (Signed)
Pt returned from MRI at this time

## 2017-03-21 NOTE — Telephone Encounter (Signed)
   Reason for Disposition . Systolic BP  >= 180 OR Diastolic >= 110  Answer Assessment - Initial Assessment Questions 1. BLOOD PRESSURE: "What is the blood pressure?" "Did you take at least two measurements 5 minutes apart?"     200/90   2. ONSET: "When did you take your blood pressure?"     This morning 3. HOW: "How did you obtain the blood pressure?" (e.g., visiting nurse, automatic home BP monitor)     Home BP monitor 4. HISTORY: "Do you have a history of high blood pressure?"     Yes 5. MEDICATIONS: "Are you taking any medications for blood pressure?" "Have you missed any doses recently?"     No missed doses 6. OTHER SYMPTOMS: "Do you have any symptoms?" (e.g., headache, chest pain, blurred vision, difficulty breathing, weakness)     Dizziness 7. PREGNANCY: "Is there any chance you are pregnant?" "When was your last menstrual period?"     No  Protocols used: HIGH B[facility]OD [facility]  Appointment made for this morning. Son will take pt.

## 2017-03-21 NOTE — ED Notes (Signed)
Pt reports woke up at approx 0300 this AM with c/o dizziness. Pt's son states he spoke with his dad earlier today and noted slurred speech so took him to PCP, facial droop noted at PCP's office, pt with noted facial symmetry upon arrival to ED. Pt is alert and oriented at this time. Pt states complete resolution of symptoms at this time.

## 2017-03-21 NOTE — ED Notes (Signed)
Hardcopy of DC signed. 

## 2017-03-21 NOTE — ED Provider Notes (Signed)
[facility] Emergency Department Provider Note ____________________________________________   I have reviewed the triage vital signs and the triage nursing note.  HISTORY  Chief Complaint Transient Ischemic Attack   Historian Patient  HPI Jeffery Campbell is a 83 y.o. male presenting for evaluation after feeling dizzy when he woke up this morning.  When he was getting out of bed he started to feel lightheaded but also a little bit of room spinning.  No vision changes.  No focal weakness or numbness or confusion or altered mental status.  Symptoms are better now.  Patient was instructed to come to the ED for further evaluation.  Denies urinary symptoms.  Denies chest pain or trouble breathing or shortness of breath or palpitations.  Symptoms essentially gone now.   Past Medical History:  Diagnosis Date  . Arrhythmia   . Cough    CHRONIC  . Depression   . Disturbance of sleep   . Diverticulitis   . GERD (gastroesophageal reflux disease)   . Hemorrhoids   . Hypertension   . OA (osteoarthritis)   . Pure hypercholesterolemia   . Stroke [facility](HCC)     Patient Active Problem List   Diagnosis Date Noted  . Hyperglycemia 10/17/2016  . Abnormal gait 10/05/2016  . Hypoxia, sleep related 04/20/2016  . Elevated hematocrit 04/04/2016  . Medication monitoring encounter 03/30/2016  . Right flank pain 01/10/2016  . General unsteadiness 05/24/2015  . Allergic rhinitis due to pollen 11/12/2014  . Back pain, chronic 10/21/2014  . Benign paroxysmal positional nystagmus 10/21/2014  . Decreased libido 10/21/2014  . Clinical depression 10/21/2014  . Fatigue 10/21/2014  . Gastro-esophageal reflux disease without esophagitis 10/21/2014  . Dysmetabolic syndrome 10/21/2014  . Fungal infection of nail 10/21/2014  . Cerebral vascular accident (HCC) 10/21/2014  . Arrhythmia, ventricular 10/21/2014  . Thalamic infarct, acute (HCC)   . Left hemiparesis (HCC)   . HLD  (hyperlipidemia) 08/19/2014  . CKD (chronic kidney disease) stage 3, GFR 30-59 ml/min (HCC) 08/19/2014  . Obesity   . Asymptomatic PVCs 05/27/2014  . Essential hypertension 04/05/2014  . Osteoarthrosis involving more than one site but not generalized 08/03/2008    Past Surgical History:  Procedure Laterality Date  . CATARACT EXTRACTION W/PHACO Left 02/14/2016   Procedure: CATARACT EXTRACTION PHACO AND INTRAOCULAR LENS PLACEMENT (IOC);  Surgeon: Galen ManilaWilliam Porfilio, MD;  Location: [facility] ORS;  Service: Ophthalmology;  Laterality: Left;  US 53.7 AP% 21.5 CDE 11.58 FLUID PACK LOT # [id number] H  . COLONOSCOPY    . KNEE ARTHROSCOPY    . TRANSURETHRAL RESECTION OF PROSTATE      Prior to Admission medications   Medication Sig Start Date End Date Taking? Authorizing Provider  atorvastatin (LIPITOR) 20 MG tablet TAKE 1 TABLET BY MOUTH EVERYDAY AT BEDTIME 10/19/16  Yes Lada, Janit BernMelinda P, MD  citalopram (CELEXA) 20 MG tablet TAKE 1 TABLET BY MOUTH EVERY DAY 12/26/16  Yes Lada, Janit BernMelinda P, MD  clopidogrel (PLAVIX) 75 MG tablet Take 1 tablet (75 mg total) by mouth daily. (stop aggrenox) 11/29/16  Yes Lada, Janit BernMelinda P, MD  Cyanocobalamin (B-12) 3000 MCG SUBL Place 1 each under the tongue daily. 10/05/16  Yes Lada, Janit BernMelinda P, MD  diltiazem (CARDIZEM CD) 120 MG 24 hr capsule Take 1 capsule (120 mg total) by mouth daily. 08/13/16  Yes Lada, Janit BernMelinda P, MD  docusate sodium (COLACE) 100 MG capsule Take 1 capsule (100 mg total) by mouth 2 (two) times daily. Patient taking differently: Take 100 mg by mouth daily as  needed.  09/01/14  Yes Love, Evlyn Kanner, PA-C  meclizine (ANTIVERT) 25 MG tablet TAKE 1/2 TO 1 TABLET (12.5-25 MG TOTAL) BY MOUTH 3 (THREE) TIMES DAILY AS NEEDED FOR DIZZINESS. 02/11/17  Yes Lada, Janit Bern, MD  Omega 3 1200 MG CAPS Take 1 capsule by mouth daily.    Yes [provider]  ranitidine (ZANTAC) 150 MG tablet Take 1 tablet (150 mg total) by mouth at bedtime. To protect stomach -- STOP lansoprazole  08/13/16  Yes Lada, Janit Bern, MD  terazosin (HYTRIN) 5 MG capsule TAKE ONE CAPSULE BY MOUTH AT BEDTIME 12/24/16  Yes Lada, Janit Bern, MD    No Known Allergies  Family History  Problem Relation Age of Onset  . Hypertension Son        Posey Rea of parents health.    Social History Social History   Tobacco Use  . Smoking status: Never Smoker  . Smokeless tobacco: Never Used  Substance Use Topics  . Alcohol use: No    Alcohol/week: 0.0 oz  . Drug use: No    Review of Systems  Constitutional: Negative for fever. Eyes: Negative for visual changes. ENT: Negative for sore throat. Cardiovascular: Negative for chest pain or palpitations. Respiratory: Negative for shortness of breath. Gastrointestinal: Negative for abdominal pain, vomiting and diarrhea. Genitourinary: Negative for dysuria. Musculoskeletal: Negative for back pain. Skin: Negative for rash. Neurological: Negative for headache.  Earlier, currently resolved.  ____________________________________________   PHYSICAL EXAM:  VITAL SIGNS: ED Triage Vitals  Enc Vitals Group     BP 03/21/17 0943 (!) 143/87     Pulse Rate 03/21/17 0943 82     Resp 03/21/17 0943 16     Temp 03/21/17 0943 98.1 F (36.7 C)     Temp Source 03/21/17 0943 Oral     SpO2 03/21/17 0943 95 %     Weight --      Height 03/21/17 0944 6\' 1"  (1.854 m)     Head Circumference --      Peak Flow --      Pain Score --      Pain Loc --      Pain Edu? --      Excl. in GC? --      Constitutional: Alert and oriented. Well appearing and in no distress. HEENT   Head: Normocephalic and atraumatic.      Eyes: Conjunctivae are normal. Pupils equal and round.       Ears:         Nose: No congestion/rhinnorhea.   Mouth/Throat: Mucous membranes are moist.   Neck: No stridor. Cardiovascular/Chest: Normal rate, regular rhythm.  No murmurs, rubs, or gallops. Respiratory: Normal respiratory effort without tachypnea nor retractions. Breath sounds are  clear and equal bilaterally. No wheezes/rales/rhonchi. Gastrointestinal: Soft. No distention, no guarding, no rebound. Nontender.    Genitourinary/rectal:Deferred Musculoskeletal: Nontender with normal range of motion in all extremities. No joint effusions.  No lower extremity tenderness.  No edema. Neurologic: No facial droop.  Cranial nerves II through X intact.  Normal speech and language.  5 out of 5 strength in 4 extremes.  Finger-nose intact bilaterally.  No gross or focal neurologic deficits are appreciated. Skin:  Skin is warm, dry and intact. No rash noted. Psychiatric: Mood and affect are normal. Speech and behavior are normal. Patient exhibits appropriate insight and judgment.   ____________________________________________  LABS (pertinent positives/negatives) I, Governor Rooks, MD the attending physician have reviewed the labs noted below.  Labs Reviewed  CBC - Abnormal; Notable for the following components:      Result Value   HCT 52.7 (*)    All other components within normal limits  DIFFERENTIAL - Abnormal; Notable for the following components:   Lymphs Abs 0.9 (*)    All other components within normal limits  COMPREHENSIVE METABOLIC PANEL - Abnormal; Notable for the following components:   CO2 21 (*)    Glucose, Bld 109 (*)    ALT 15 (*)    Total Bilirubin 1.6 (*)    GFR calc non Af Amer 58 (*)    All other components within normal limits  GLUCOSE, CAPILLARY - Abnormal; Notable for the following components:   Glucose-Capillary 116 (*)    All other components within normal limits  URINALYSIS, COMPLETE (UACMP) WITH MICROSCOPIC - Abnormal; Notable for the following components:   Color, Urine YELLOW (*)    APPearance CLEAR (*)    All other components within normal limits  PROTIME-INR  APTT  TROPONIN I  CBG MONITORING, ED    ____________________________________________    EKG I, Governor Rooksebecca Luevenia Mcavoy, MD, the attending physician have personally viewed and interpreted all  ECGs.  75 bpm.  Normal sinus rhythm.  Left axis deviation.  Nonspecific ST and T wave ____________________________________________  RADIOLOGY All Xrays were viewed by me.  Imaging interpreted by Radiologist, and I, Governor Rooksebecca Kamyia Thomason, MD the attending physician have reviewed the radiologist interpretation noted below.   CT head without contrast:  IMPRESSION: Question subtle low-density in the left thalamus, possibly acute infarction. This could be further evaluated with MRI.  MRi brain:  IMPRESSION: No acute finding by MRI. 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 Mild chronic small-vessel ischemic changes of the hemispheric white matter. Cerebellar atrophy. __________________________________________  PROCEDURES  Procedure(s) performed: None  Critical Care performed: None   ____________________________________________  ED COURSE / ASSESSMENT AND PLAN  Pertinent labs & imaging results that were available during my care of the patient were reviewed by me and considered in my medical decision making (see chart for details).    Patient brought in for evaluation of dizziness he has had a stroke before although it sounds like it did affect his upper and lower extremity at that point time.  Symptoms do not sound classic for CVA or TIA, however will initiate workup for dizziness.  Part of what he is describing sound like vertigo, however when I laid him bed and turned his head, no elicitation of symptoms or vertigo.  Initial CT showed the possibility for acute stroke and was sent for MRI.  MRI was reassuring for no acute finding.  The rest of his exam and evaluation are reassuring for no other explanation for dizziness episode earlier.  I am less concerned that this was actually a TIA.  He is already on medical treatment for prior stroke.  I think he is okay for discharge home.  Patient and son are comfortable.  DIFFERENTIAL DIAGNOSIS: Including but not limited to dehydration,  electrolyte disturbance, renal failure, viral syndrome, vertigo, CVA or TIA, arrhythmia, etc. CONSULTATIONS:  None   Patient / Family / Caregiver informed of clinical course, medical decision-making process, and agree with plan.   I discussed return precautions, follow-up instructions, and discharge instructions with patient and/or family.  Discharge Instructions : You are evaluated for dizziness, and although no certain cause was found, your exam and evaluation are overall reassuring in the emergency department today.  We discussed this may been vertigo.  You may try over-the-counter meclizine if  you have recurrent dizziness and room spinning.  Return to the emergency department for any new or worsening condition including one-sided weakness or numbness, confusion or altered mental status, dizziness passing out, chest pain, coordination problem, or any other symptoms concerning to you.    ___________________________________________   FINAL CLINICAL IMPRESSION(S) / ED DIAGNOSES   Final diagnoses:  Dizziness      ___________________________________________        Note: This dictation was prepared with Dragon dictation. Any transcriptional errors that result from this process are unintentional    Governor Rooks, MD 03/21/17 1554

## 2017-03-21 NOTE — ED Notes (Signed)
D/w Dr. Derrill KayGoodman, pt's LKW time and symptoms, no need to call Code Stroke at this time.

## 2017-03-21 NOTE — ED Notes (Signed)
Pt taken to MRI by this RN at this time.  

## 2017-03-21 NOTE — Patient Instructions (Signed)
Go directly to [facility] ER for further evaluation for possible stroke.

## 2017-03-21 NOTE — ED Notes (Signed)
Pt given phone for MRI at this time. NAD noted, pt resting in bed watching TV with his son at bedside. Will continue to monitor for further patient needs.

## 2017-04-08 ENCOUNTER — Encounter: Payer: Self-pay | Admitting: Family Medicine

## 2017-04-08 ENCOUNTER — Ambulatory Visit (INDEPENDENT_AMBULATORY_CARE_PROVIDER_SITE_OTHER): Payer: PPO | Admitting: Family Medicine

## 2017-04-08 VITALS — BP 124/70 | HR 71 | Temp 98.3°F | Resp 16 | Wt 243.0 lb

## 2017-04-08 DIAGNOSIS — H6123 Impacted cerumen, bilateral: Secondary | ICD-10-CM

## 2017-04-08 DIAGNOSIS — G8194 Hemiplegia, unspecified affecting left nondominant side: Secondary | ICD-10-CM

## 2017-04-08 DIAGNOSIS — E782 Mixed hyperlipidemia: Secondary | ICD-10-CM | POA: Diagnosis not present

## 2017-04-08 DIAGNOSIS — E8881 Metabolic syndrome: Secondary | ICD-10-CM

## 2017-04-08 DIAGNOSIS — Z6831 Body mass index (BMI) 31.0-31.9, adult: Secondary | ICD-10-CM

## 2017-04-08 DIAGNOSIS — E6609 Other obesity due to excess calories: Secondary | ICD-10-CM | POA: Diagnosis not present

## 2017-04-08 DIAGNOSIS — N183 Chronic kidney disease, stage 3 unspecified: Secondary | ICD-10-CM

## 2017-04-08 DIAGNOSIS — R739 Hyperglycemia, unspecified: Secondary | ICD-10-CM

## 2017-04-08 NOTE — Assessment & Plan Note (Signed)
watch sugar and cholesterol; work on weight loss

## 2017-04-08 NOTE — Assessment & Plan Note (Signed)
Watch a1c

## 2017-04-08 NOTE — Patient Instructions (Signed)
We'll get labs today If you have not heard anything from my staff in a week about any orders/referrals/studies from today, please contact us here to follow-up [phone] Check out the information at [email or web address] entitled "Nutrition for Weight Loss: What You Need to Know about Fad Diets" Try to lose between 1-2 pounds per week by taking in fewer calories and burning off more calories You can succeed by limiting portions, limiting foods dense in calories and fat, becoming more active, and drinking 8 glasses of water a day (64 ounces) Don't skip meals, especially breakfast, as skipping meals may alter your metabolism Do not use over-the-counter weight loss pills or gimmicks that claim rapid weight loss A healthy BMI (or body mass index) is between 18.5 and 24.9 You can calculate your ideal BMI at the NIH website http://www.nhlbi.nih.gov/health/educational/lose_wt/BMI/bmicalc.htm  

## 2017-04-08 NOTE — Assessment & Plan Note (Signed)
He'll work on losing 10 pounds

## 2017-04-08 NOTE — Assessment & Plan Note (Signed)
Check lipids today; limit saturated fats 

## 2017-04-08 NOTE — Assessment & Plan Note (Signed)
Just checked in the ER in December; avoid NSAIDs

## 2017-04-08 NOTE — Progress Notes (Signed)
BP 124/70   Pulse 71   Temp 98.3 F (36.8 C) (Oral)   Resp 16   Wt 243 lb (110.2 kg)   SpO2 98%   BMI 32.06 kg/m    Subjective:    Patient ID: Jeffery Campbell, male    DOB: 1928/04/21, 84 y.o.   MRN: [id number]  HPI: Jeffery Campbell is a 84 y.o. male  Chief Complaint  Patient presents with  . Follow-up    HPI  He was dizzy back in December and was sent to the ER  ER records reviewed from 03/21/17: CT head without contrast:  IMPRESSION: Question subtle low-density in the left thalamus, possibly acute infarction. This could be further evaluated with MRI.  MRi brain:  IMPRESSION: No acute finding by MRI. 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 Mild chronic small-vessel ischemic changes of the hemispheric white matter. Cerebellar atrophy.  Hx of thalamic stroke; on blood thinner; no bleeding  CKD; avoiding NSAIDs, good water drinking  High cholesterol; no eggs; on statin; ate cereal this morning  Obesity; up a few pounds over the holidays; he will work on losing 10 pounds; [employer]; will start back slow  Little cold; wants ears checked out; itching  Depression screen [facility] 2/9 04/08/2017 03/21/2017 10/05/2016 03/30/2016 01/10/2016  Decreased Interest 0 0 0 0 0  Down, Depressed, Hopeless 0 0 0 0 0  PHQ - 2 Score 0 0 0 0 0  Altered sleeping - - - - -  Tired, decreased energy - - - - -  Change in appetite - - - - -  Feeling bad or failure about yourself  - - - - -  Trouble concentrating - - - - -  Moving slowly or fidgety/restless - - - - -  Suicidal thoughts - - - - -  PHQ-9 Score - - - - -    Relevant past medical, surgical, family and social history reviewed Past Medical History:  Diagnosis Date  . Arrhythmia   . Cough    CHRONIC  . Depression   . Disturbance of sleep   . Diverticulitis   . GERD (gastroesophageal reflux disease)   . Hemorrhoids   . Hypertension   . OA (osteoarthritis)   . Pure hypercholesterolemia   . Stroke [facility])    Past Surgical  History:  Procedure Laterality Date  . CATARACT EXTRACTION W/PHACO Left 02/14/2016   Procedure: CATARACT EXTRACTION PHACO AND INTRAOCULAR LENS PLACEMENT (IOC);  Surgeon: Galen Manila, MD;  Location: [facility] ORS;  Service: Ophthalmology;  Laterality: Left;  [place] 53.7 AP% 21.5 CDE 11.58 FLUID PACK LOT # [id number] H  . COLONOSCOPY    . KNEE ARTHROSCOPY    . TRANSURETHRAL RESECTION OF PROSTATE     Family History  Problem Relation Age of Onset  . Hypertension Son        Posey Rea of parents health.   Social History   Tobacco Use  . Smoking status: Never Smoker  . Smokeless tobacco: Never Used  Substance Use Topics  . Alcohol use: No    Alcohol/week: 0.0 oz  . Drug use: No    Interim medical history since last visit reviewed. Allergies and medications reviewed  Review of Systems  Respiratory: Negative for shortness of breath.   Cardiovascular: Negative for chest pain.  Gastrointestinal: Negative for nausea.   Per HPI unless specifically indicated above     Objective:    BP 124/70   Pulse 71   Temp 98.3 F (36.8 C) (Oral)  Resp 16   Wt 243 lb (110.2 kg)   SpO2 98%   BMI 32.06 kg/m   Wt Readings from Last 3 Encounters:  04/08/17 243 lb (110.2 kg)  03/21/17 239 lb 6.4 oz (108.6 kg)  11/15/16 238 lb (108 kg)    Physical Exam  Constitutional: He appears well-developed and well-nourished. No distress.  HENT:  Head: Normocephalic and atraumatic.  Mouth/Throat: Oropharynx is clear and moist and mucous membranes are normal.  Impacted cerumen bilatearlly, removed with curette under direct visualization by MD; tolerated well  Eyes: EOM are normal. No scleral icterus.  Neck: No JVD present. Carotid bruit is not present. No thyromegaly present.  Cardiovascular: Normal rate and regular rhythm.  Pulmonary/Chest: Effort normal and breath sounds normal.  Abdominal: Soft. Bowel sounds are normal. He exhibits no distension.  Musculoskeletal: He exhibits no edema.  Neurological: He  is alert.  Grip and leg extension 5/5  Skin: Skin is warm and dry. No bruising and no ecchymosis noted. No pallor.  Psychiatric: He has a normal mood and affect. His behavior is normal. Judgment and thought content normal. His mood appears not anxious. He does not exhibit a depressed mood.    Results for orders placed or performed during the hospital encounter of 03/21/17  Protime-INR  Result Value Ref Range   Prothrombin Time 13.3 11.4 - 15.2 seconds   INR 1.02   APTT  Result Value Ref Range   aPTT 31 24 - 36 seconds  CBC  Result Value Ref Range   WBC 5.8 3.8 - 10.6 K/uL   RBC 5.64 4.40 - 5.90 MIL/uL   Hemoglobin 17.8 13.0 - 18.0 g/dL   HCT 16.1 (H) 09.6 - 04.5 %   MCV 93.5 80.0 - 100.0 fL   MCH 31.6 26.0 - 34.0 pg   MCHC 33.8 32.0 - 36.0 g/dL   RDW 40.9 81.1 - 91.4 %   Platelets 199 150 - 440 K/uL  Differential  Result Value Ref Range   Neutrophils Relative % 75 %   Neutro Abs 4.3 1.4 - 6.5 K/uL   Lymphocytes Relative 15 %   Lymphs Abs 0.9 (L) 1.0 - 3.6 K/uL   Monocytes Relative 9 %   Monocytes Absolute 0.5 0.2 - 1.0 K/uL   Eosinophils Relative 1 %   Eosinophils Absolute 0.0 0 - 0.7 K/uL   Basophils Relative 0 %   Basophils Absolute 0.0 0 - 0.1 K/uL  Comprehensive metabolic panel  Result Value Ref Range   Sodium 138 135 - 145 mmol/L   Potassium 4.0 3.5 - 5.1 mmol/L   Chloride 107 101 - 111 mmol/L   CO2 21 (L) 22 - 32 mmol/L   Glucose, Bld 109 (H) 65 - 99 mg/dL   BUN 14 6 - 20 mg/dL   Creatinine, Ser 7.82 0.61 - 1.24 mg/dL   Calcium 8.9 8.9 - 95.6 mg/dL   Total Protein 7.0 6.5 - 8.1 g/dL   Albumin 4.1 3.5 - 5.0 g/dL   AST 21 15 - 41 U/L   ALT 15 (L) 17 - 63 U/L   Alkaline Phosphatase 45 38 - 126 U/L   Total Bilirubin 1.6 (H) 0.3 - 1.2 mg/dL   GFR calc non Af Amer 58 (L) >60 mL/min   GFR calc Af Amer >60 >60 mL/min   Anion gap 10 5 - 15  Troponin I  Result Value Ref Range   Troponin I <0.03 <0.03 ng/mL  Glucose, capillary  Result Value Ref Range  Glucose-Capillary 116 (H) 65 - 99 mg/dL   Comment 1 Notify RN   Urinalysis, Complete w Microscopic  Result Value Ref Range   Color, Urine YELLOW (A) YELLOW   APPearance CLEAR (A) CLEAR   Specific Gravity, Urine 1.009 1.005 - 1.030   pH 6.0 5.0 - 8.0   Glucose, UA NEGATIVE NEGATIVE mg/dL   Hgb urine dipstick NEGATIVE NEGATIVE   Bilirubin Urine NEGATIVE NEGATIVE   Ketones, ur NEGATIVE NEGATIVE mg/dL   Protein, ur NEGATIVE NEGATIVE mg/dL   Nitrite NEGATIVE NEGATIVE   Leukocytes, UA NEGATIVE NEGATIVE   RBC / HPF 0-5 0 - 5 RBC/hpf   WBC, UA 0-5 0 - 5 WBC/hpf   Bacteria, UA NONE SEEN NONE SEEN   Squamous Epithelial / LPF NONE SEEN NONE SEEN  Glucose, capillary  Result Value Ref Range   Glucose-Capillary 103 (H) 65 - 99 mg/dL      Assessment & Plan:   Problem List Items Addressed This Visit      Nervous and Auditory   Left hemiparesis (HCC)    Per the chart, but I do not detect any weakness and patient does not feel any        Genitourinary   CKD (chronic kidney disease) stage 3, GFR 30-59 ml/min (HCC) - Primary    Just checked in the ER in December; avoid NSAIDs        Other   Obesity    He'll work on losing 10 pounds      Hyperglycemia    Watch a1c      Relevant Orders   Hemoglobin A1c   HLD (hyperlipidemia)    Check lipids today; limit saturated fats      Relevant Orders   Lipid panel   Dysmetabolic syndrome    watch sugar and cholesterol; work on weight loss      Relevant Orders   Lipid panel    Other Visit Diagnoses    Hyperbilirubinemia       Relevant Orders   COMPLETE METABOLIC PANEL WITH GFR   Bilirubin, Direct   Impacted cerumen of both ears           Follow up plan: Return in about 6 months (around 10/06/2017).  An after-visit summary was printed and given to the patient at check-out.  Please see the patient instructions which may contain other information and recommendations beyond what is mentioned above in the assessment and plan.  No  orders of the defined types were placed in this encounter.   Orders Placed This Encounter  Procedures  . Hemoglobin A1c  . Lipid panel  . COMPLETE METABOLIC PANEL WITH GFR  . Bilirubin, Direct

## 2017-04-08 NOTE — Assessment & Plan Note (Signed)
Per the chart, but I do not detect any weakness and patient does not feel any

## 2017-04-09 LAB — HEMOGLOBIN A1C
EAG (MMOL/L): 5.5 (calc)
HEMOGLOBIN A1C: 5.1 %{Hb} (ref <5.7)
Mean Plasma Glucose: 100 (calc)

## 2017-04-09 LAB — COMPLETE METABOLIC PANEL WITH GFR
AG RATIO: 1.5 (calc) (ref 1.0–2.5)
ALKALINE PHOSPHATASE (APISO): 50 U/L (ref 40–115)
ALT: 11 U/L (ref 9–46)
AST: 11 U/L (ref 10–35)
Albumin: 4.1 g/dL (ref 3.6–5.1)
BILIRUBIN TOTAL: 1.5 mg/dL — AB (ref 0.2–1.2)
BUN/Creatinine Ratio: 10 (calc) (ref 6–22)
BUN: 12 mg/dL (ref 7–25)
CHLORIDE: 107 mmol/L (ref 98–110)
CO2: 26 mmol/L (ref 20–32)
Calcium: 9.2 mg/dL (ref 8.6–10.3)
Creat: 1.17 mg/dL — ABNORMAL HIGH (ref 0.70–1.11)
GFR, EST AFRICAN AMERICAN: 65 mL/min/{1.73_m2} (ref >=60)
GFR, Est Non African American: 56 mL/min/{1.73_m2} — ABNORMAL LOW (ref >=60)
GLUCOSE: 92 mg/dL (ref 65–139)
Globulin: 2.7 g/dL (calc) (ref 1.9–3.7)
POTASSIUM: 4.1 mmol/L (ref 3.5–5.3)
Sodium: 140 mmol/L (ref 135–146)
Total Protein: 6.8 g/dL (ref 6.1–8.1)

## 2017-04-09 LAB — LIPID PANEL
CHOL/HDL RATIO: 2.4 (calc) (ref <5.0)
Cholesterol: 119 mg/dL (ref <200)
HDL: 49 mg/dL (ref >40)
LDL CHOLESTEROL (CALC): 56 mg/dL
NON-HDL CHOLESTEROL (CALC): 70 mg/dL (ref <130)
TRIGLYCERIDES: 60 mg/dL (ref <150)

## 2017-04-09 LAB — BILIRUBIN, DIRECT: BILIRUBIN DIRECT: 0.4 mg/dL — AB (ref 0.0–0.2)

## 2017-04-11 ENCOUNTER — Telehealth: Payer: Self-pay | Admitting: Family Medicine

## 2017-04-11 NOTE — Telephone Encounter (Signed)
Copied from CRM [phone]#[id number]. Topic: Quick Communication - Rx Refill/Question >> Apr 11, 2017 10:32 AM Alexander BergeronBarksdale, Jeffery B wrote: Reason for CRM: pt called b/c he seems to keep having a runny nose and would like a Rx called in for him contact pt to advise

## 2017-04-12 ENCOUNTER — Other Ambulatory Visit: Payer: Self-pay | Admitting: Family Medicine

## 2017-04-12 ENCOUNTER — Telehealth: Payer: Self-pay

## 2017-04-12 MED ORDER — AZELASTINE HCL 0.1 % NA SOLN: 2.0000 | Freq: Two times a day (BID) | NASAL | 12 refills | Status: DC

## 2017-04-12 NOTE — Telephone Encounter (Signed)
Yes, new Rx sent for Astelin I hope that helps Thank you

## 2017-04-12 NOTE — Telephone Encounter (Signed)
Duplicate

## 2017-04-12 NOTE — Telephone Encounter (Signed)
Pt.notified

## 2017-04-12 NOTE — Telephone Encounter (Signed)
Please advise 

## 2017-04-12 NOTE — Telephone Encounter (Signed)
Pt called states his nose is running bad is there anything you can give him for this? He is not asking for antibiotic.

## 2017-04-13 ENCOUNTER — Other Ambulatory Visit: Payer: Self-pay | Admitting: Family Medicine

## 2017-04-13 DIAGNOSIS — E782 Mixed hyperlipidemia: Secondary | ICD-10-CM

## 2017-04-14 NOTE — Telephone Encounter (Signed)
Last lipids and sgpt reviewed; Rx approved 

## 2017-05-18 NOTE — Telephone Encounter (Signed)
Document blank Closing out old note

## 2017-05-30 ENCOUNTER — Ambulatory Visit (INDEPENDENT_AMBULATORY_CARE_PROVIDER_SITE_OTHER): Payer: PPO | Admitting: Pulmonary Disease

## 2017-05-30 ENCOUNTER — Encounter: Payer: Self-pay | Admitting: Pulmonary Disease

## 2017-05-30 VITALS — BP 124/84 | HR 75 | Ht 73.0 in | Wt 243.0 lb

## 2017-05-30 DIAGNOSIS — G4734 Idiopathic sleep related nonobstructive alveolar hypoventilation: Secondary | ICD-10-CM | POA: Diagnosis not present

## 2017-05-30 DIAGNOSIS — D751 Secondary polycythemia: Secondary | ICD-10-CM | POA: Diagnosis not present

## 2017-05-30 DIAGNOSIS — G4733 Obstructive sleep apnea (adult) (pediatric): Secondary | ICD-10-CM | POA: Diagnosis not present

## 2017-05-30 NOTE — Addendum Note (Signed)
Addended by: Meyer CoryAHMAD, MISTY R on: 05/30/2017 10:16 AM   Modules accepted: Orders

## 2017-05-30 NOTE — Patient Instructions (Addendum)
We will talk to the oxygen supply company and get to bedside oxygen concentrator I strongly recommend that you wear oxygen with sleep at 2 L/min by nasal cannula Follow-up in 4-6 months

## 2017-05-30 NOTE — Progress Notes (Signed)
PULMONARY OFFICE FOLLOW UP NOTE  Requesting MD/Service: Lada Date of initial consultation: 05/29/16 Reason for consultation:  Polycythemia  PT PROFILE: [name] never smoker referred for evaluation of polycythemia and nocturnal hypoxemia  PROBLEMS: Polycythemia Severe OSA with nocturnal hypoxemia  DATA: Overnight oximetry (RA) 04/10/16: Lowest SpO2 79%, Avg 25 desaturation events per hour Overnight oximetry (2 LPM [place]) 06/04/16: 161 desaturation events, 24 per hour. Lowest SPO2 82% Polysomnogram 07/10/16: Severe OSA with AHI of 40 per hour CPAP titration 07/31/16:Recommendation of AutoSet CPAP 10-20 cm H2O. PLM index 165.5 with arousal index of 4.9. Recommended check of ferritin level  SUBJ: Routine reevaluation for obstructive sleep apnea with significant nocturnal hypoxemia and secondary polycythemia.  He has been intolerant to nasal CPAP.  He is now no longer wearing nocturnal oxygen and indicates to me that his oxygen supply company has taken the oxygen concentrator away.  He has no new complaints.  He feels that his sleep quality is good.  He denies all respiratory symptoms.  He denies daytime hypersomnolence.  Vitals:   05/30/17 0903 05/30/17 0906  BP:  124/84  Pulse:  75  SpO2:  95%  Weight: 110.2 kg (243 lb)   Height: 6\' 1"  (1.854 m)   Room air   EXAM:   Gen: NAD, appears much younger than true age HEENT WNL Neck: No JVD noted Lungs: Sounds are full without wheezes or adventitious sounds Cardiovascular: RRR, soft systolic murmur Abdomen: Soft, NT, +BS Ext: no C/C/E, BLE varicosities present  Neuro: Grossly intact   DATA:   BMP Latest Ref Rng & Units 04/08/2017 03/21/2017 10/12/2016  Glucose 65 - 139 mg/dL 92 409(W109(H) 96  BUN 7 - 25 mg/dL 12 14 14   Creatinine 0.70 - 1.11 mg/dL 1.19(J1.17(H) 4.781.11 2.95(A1.18(H)  BUN/Creat Ratio 6 - 22 (calc) 10 - -  Sodium 135 - 146 mmol/L 140 138 137  Potassium 3.5 - 5.3 mmol/L 4.1 4.0 4.4  Chloride 98 - 110 mmol/L 107 107 105  CO2 20 - 32  mmol/L 26 21(L) 23  Calcium 8.6 - 10.3 mg/dL 9.2 8.9 8.9    CBC Latest Ref Rng & Units 03/21/2017 10/12/2016 04/03/2016  WBC 3.8 - 10.6 K/uL 5.8 4.7 5.5  Hemoglobin 13.0 - 18.0 g/dL 21.317.8 08.616.9 17.4(H)  Hematocrit 40.0 - 52.0 % 52.7(H) 48.9 50.1(H)  Platelets 150 - 440 K/uL 199 212 216    CXR:  NNF  IMPRESSION:     ICD-10-CM   1. Polycythemia, secondary D75.1   2. Nocturnal hypoxemia G47.34 AMB REFERRAL FOR DME  3. Obstructive sleep apnea G47.33      PLAN:  I again discussed with him in detail the importance of wearing oxygen with sleep given the persistent moderately severe polycythemia.  He agrees that he will again try to comply with this recommendation.  He has been completely unable to tolerate CPAP.  Nocturnal oxygen has again been ordered for him at 2 L/min by nasal cannula  Follow-up in 4-6 months or sooner as needed  At some point, after we get him compliant with nocturnal oxygen, we should repeat his CBC.    Jeffery Fischeravid Leniya Breit, MD PCCM service Mobile [phone] Pager [phone] 05/30/2017 9:20 AM

## 2017-06-03 ENCOUNTER — Encounter: Payer: Self-pay | Admitting: Pulmonary Disease

## 2017-06-03 DIAGNOSIS — D751 Secondary polycythemia: Secondary | ICD-10-CM

## 2017-06-03 DIAGNOSIS — G4734 Idiopathic sleep related nonobstructive alveolar hypoventilation: Secondary | ICD-10-CM

## 2017-06-05 DIAGNOSIS — G4736 Sleep related hypoventilation in conditions classified elsewhere: Secondary | ICD-10-CM | POA: Diagnosis not present

## 2017-06-19 ENCOUNTER — Other Ambulatory Visit: Payer: Self-pay | Admitting: Family Medicine

## 2017-06-19 DIAGNOSIS — R42 Dizziness and giddiness: Secondary | ICD-10-CM

## 2017-06-20 NOTE — Addendum Note (Signed)
Addended by: Frankey ShownGLANTON, Dilpreet Faires C on: 06/20/2017 12:09 PM   Modules accepted: Orders

## 2017-06-20 NOTE — Telephone Encounter (Signed)
Called pt and informed him that the RX has been sent in and that he should establish a connection with a neurologist. Pt verbalized understanding and agreed to the referral.

## 2017-06-20 NOTE — Telephone Encounter (Signed)
Please let pt know that he is using a lot of meclizine If he is this dizzy, I need him to see a neurologist Please put in referral; thank you

## 2017-06-21 ENCOUNTER — Telehealth: Payer: Self-pay | Admitting: *Deleted

## 2017-06-21 DIAGNOSIS — G4736 Sleep related hypoventilation in conditions classified elsewhere: Secondary | ICD-10-CM

## 2017-06-21 NOTE — Telephone Encounter (Signed)
Pt informed he needs night time oxygen at 2LPM. Order placed. Nothing further needed.

## 2017-06-24 ENCOUNTER — Other Ambulatory Visit: Payer: Self-pay | Admitting: Family Medicine

## 2017-06-26 DIAGNOSIS — G4733 Obstructive sleep apnea (adult) (pediatric): Secondary | ICD-10-CM | POA: Diagnosis not present

## 2017-06-26 DIAGNOSIS — G4736 Sleep related hypoventilation in conditions classified elsewhere: Secondary | ICD-10-CM | POA: Diagnosis not present

## 2017-06-26 DIAGNOSIS — D751 Secondary polycythemia: Secondary | ICD-10-CM | POA: Diagnosis not present

## 2017-07-03 DIAGNOSIS — R42 Dizziness and giddiness: Secondary | ICD-10-CM | POA: Diagnosis not present

## 2017-07-03 DIAGNOSIS — G8194 Hemiplegia, unspecified affecting left nondominant side: Secondary | ICD-10-CM | POA: Diagnosis present

## 2017-07-26 ENCOUNTER — Other Ambulatory Visit: Payer: Self-pay | Admitting: Family Medicine

## 2017-07-27 DIAGNOSIS — G4733 Obstructive sleep apnea (adult) (pediatric): Secondary | ICD-10-CM | POA: Diagnosis not present

## 2017-07-27 DIAGNOSIS — G4736 Sleep related hypoventilation in conditions classified elsewhere: Secondary | ICD-10-CM | POA: Diagnosis not present

## 2017-07-27 DIAGNOSIS — D751 Secondary polycythemia: Secondary | ICD-10-CM | POA: Diagnosis not present

## 2017-08-06 ENCOUNTER — Other Ambulatory Visit: Payer: Self-pay | Admitting: Family Medicine

## 2017-08-26 DIAGNOSIS — G4733 Obstructive sleep apnea (adult) (pediatric): Secondary | ICD-10-CM | POA: Diagnosis not present

## 2017-08-26 DIAGNOSIS — G4736 Sleep related hypoventilation in conditions classified elsewhere: Secondary | ICD-10-CM | POA: Diagnosis not present

## 2017-08-26 DIAGNOSIS — D751 Secondary polycythemia: Secondary | ICD-10-CM | POA: Diagnosis not present

## 2017-09-05 ENCOUNTER — Other Ambulatory Visit: Payer: Self-pay | Admitting: Family Medicine

## 2017-09-06 DIAGNOSIS — H04223 Epiphora due to insufficient drainage, bilateral lacrimal glands: Secondary | ICD-10-CM | POA: Diagnosis not present

## 2017-09-06 LAB — HM DIABETES FOOT EXAM

## 2017-09-11 ENCOUNTER — Other Ambulatory Visit: Payer: Self-pay | Admitting: Family Medicine

## 2017-09-26 DIAGNOSIS — G4736 Sleep related hypoventilation in conditions classified elsewhere: Secondary | ICD-10-CM | POA: Diagnosis not present

## 2017-09-26 DIAGNOSIS — G4733 Obstructive sleep apnea (adult) (pediatric): Secondary | ICD-10-CM | POA: Diagnosis not present

## 2017-09-26 DIAGNOSIS — D751 Secondary polycythemia: Secondary | ICD-10-CM | POA: Diagnosis not present

## 2017-10-07 ENCOUNTER — Ambulatory Visit (INDEPENDENT_AMBULATORY_CARE_PROVIDER_SITE_OTHER): Payer: PPO | Admitting: Family Medicine

## 2017-10-07 ENCOUNTER — Encounter: Payer: Self-pay | Admitting: Family Medicine

## 2017-10-07 DIAGNOSIS — Z5181 Encounter for therapeutic drug level monitoring: Secondary | ICD-10-CM

## 2017-10-07 DIAGNOSIS — R739 Hyperglycemia, unspecified: Secondary | ICD-10-CM | POA: Diagnosis not present

## 2017-10-07 DIAGNOSIS — I499 Cardiac arrhythmia, unspecified: Secondary | ICD-10-CM | POA: Diagnosis not present

## 2017-10-07 DIAGNOSIS — E6609 Other obesity due to excess calories: Secondary | ICD-10-CM | POA: Diagnosis not present

## 2017-10-07 DIAGNOSIS — E782 Mixed hyperlipidemia: Secondary | ICD-10-CM

## 2017-10-07 DIAGNOSIS — I10 Essential (primary) hypertension: Secondary | ICD-10-CM

## 2017-10-07 DIAGNOSIS — N183 Chronic kidney disease, stage 3 unspecified: Secondary | ICD-10-CM

## 2017-10-07 DIAGNOSIS — Z6832 Body mass index (BMI) 32.0-32.9, adult: Secondary | ICD-10-CM | POA: Diagnosis not present

## 2017-10-07 DIAGNOSIS — R7303 Prediabetes: Secondary | ICD-10-CM | POA: Diagnosis not present

## 2017-10-07 MED ORDER — TERAZOSIN HCL 2 MG PO CAPS: 2.0000 mg | ORAL_CAPSULE | Freq: Every day | ORAL | 5 refills | Status: DC

## 2017-10-07 NOTE — Assessment & Plan Note (Signed)
Limit sweets; check A1c and glucose

## 2017-10-07 NOTE — Assessment & Plan Note (Signed)
Encouraged weight loss; see AVS; try to lose 1/2 to 1 pound per week to get BMI under 30 as next goal

## 2017-10-07 NOTE — Progress Notes (Signed)
BP (!) 116/58   Pulse 77   Temp 98.4 F (36.9 C) (Oral)   Resp 14   Ht 6\' 1"  (1.854 m)   Wt 245 lb (111.1 kg)   SpO2 94%   BMI 32.32 kg/m    Subjective:    Patient ID: Jeffery Campbell, male    DOB: [birth year], 84 y.o.   MRN: [id number]  HPI: Jeffery Campbell is a 84 y.o. male  Chief Complaint  Patient presents with  . Follow-up    HPI Patient is here for follow-up; no medical excitement since last visit  He has hypertension; pressure is on the lower side today; a little tired today  Hyperlipidemia; on statin; no muscle aches; no bacon; some hamburgers; he knows to eat more veggies Lab Results  Component Value Date   CHOL 119 04/08/2017   HDL 49 04/08/2017   LDLCALC 56 04/08/2017   TRIG 60 04/08/2017   CHOLHDL 2.4 04/08/2017   Hyperglycemia; cutting out sodas; NOT fasting Lab Results  Component Value Date   HGBA1C 5.1 04/08/2017   CKD stage 3; no NSAIDs; trying to drink more water  Obesity; does not get hungry; he has started drinking more water, cutting sodas  Depression screen [facility] 2/9 10/07/2017 10/07/2017 04/08/2017 03/21/2017 10/05/2016  Decreased Interest 0 0 0 0 0  Down, Depressed, Hopeless 0 0 0 0 0  PHQ - 2 Score 0 0 0 0 0  Altered sleeping 0 - - - -  Tired, decreased energy 0 - - - -  Change in appetite 0 - - - -  Feeling bad or failure about yourself  0 - - - -  Trouble concentrating 0 - - - -  Moving slowly or fidgety/restless 0 - - - -  Suicidal thoughts 0 - - - -  PHQ-9 Score 0 - - - -  Difficult doing work/chores Not difficult at all - - - -    Relevant past medical, surgical, family and social history reviewed Past Medical History:  Diagnosis Date  . Arrhythmia   . Cough    CHRONIC  . Depression   . Disturbance of sleep   . Diverticulitis   . GERD (gastroesophageal reflux disease)   . Hemorrhoids   . Hypertension   . OA (osteoarthritis)   . Pure hypercholesterolemia   . Stroke [facility])    Past Surgical History:  Procedure Laterality Date  .  CATARACT EXTRACTION W/PHACO Left 02/14/2016   Procedure: CATARACT EXTRACTION PHACO AND INTRAOCULAR LENS PLACEMENT (IOC);  Surgeon: Galen Manila, MD;  Location: [facility] ORS;  Service: Ophthalmology;  Laterality: Left;  [place] 53.7 AP% 21.5 CDE 11.58 FLUID PACK LOT # [id number] H  . COLONOSCOPY    . KNEE ARTHROSCOPY    . TRANSURETHRAL RESECTION OF PROSTATE     Family History  Problem Relation Age of Onset  . Hypertension Son        Posey Rea of parents health.   Social History   Tobacco Use  . Smoking status: Never Smoker  . Smokeless tobacco: Never Used  Substance Use Topics  . Alcohol use: No    Alcohol/week: 0.0 oz  . Drug use: No    Interim medical history since last visit reviewed. Allergies and medications reviewed  Review of Systems Per HPI unless specifically indicated above     Objective:    BP (!) 116/58   Pulse 77   Temp 98.4 F (36.9 C) (Oral)   Resp 14   Ht 6\' 1"  (1.854 m)  Wt 245 lb (111.1 kg)   SpO2 94%   BMI 32.32 kg/m   Wt Readings from Last 3 Encounters:  10/07/17 245 lb (111.1 kg)  05/30/17 243 lb (110.2 kg)  04/08/17 243 lb (110.2 kg)    Physical Exam  Constitutional: He appears well-developed and well-nourished. No distress.  HENT:  Head: Normocephalic and atraumatic.  Eyes: EOM are normal. No scleral icterus.  Neck: No thyromegaly present.  Cardiovascular: Normal rate and regular rhythm.  Pulmonary/Chest: Effort normal and breath sounds normal.  Abdominal: Soft. Bowel sounds are normal. He exhibits no distension.  Musculoskeletal: He exhibits no edema.  Neurological: Coordination normal.  Skin: Skin is warm and dry. No pallor.  Varicose veins R>L  Psychiatric: He has a normal mood and affect. His behavior is normal. Judgment and thought content normal.    Results for orders placed or performed in visit on 04/08/17  Hemoglobin A1c  Result Value Ref Range   Hgb A1c MFr Bld 5.1 <5.7 % of total Hgb   Mean Plasma Glucose 100 (calc)   eAG  (mmol/L) 5.5 (calc)  Lipid panel  Result Value Ref Range   Cholesterol 119 <200 mg/dL   HDL 49 >45>40 mg/dL   Triglycerides 60 <409<150 mg/dL   LDL Cholesterol (Calc) 56 mg/dL (calc)   Total CHOL/HDL Ratio 2.4 <5.0 (calc)   Non-HDL Cholesterol (Calc) 70 <811<130 mg/dL (calc)  COMPLETE METABOLIC PANEL WITH GFR  Result Value Ref Range   Glucose, Bld 92 65 - 139 mg/dL   BUN 12 7 - 25 mg/dL   Creat 9.141.17 (H) 7.820.70 - 1.11 mg/dL   GFR, Est Non African American 56 (L) > OR = 60 mL/min/1.4473m2   GFR, Est African American 65 > OR = 60 mL/min/1.5173m2   BUN/Creatinine Ratio 10 6 - 22 (calc)   Sodium 140 135 - 146 mmol/L   Potassium 4.1 3.5 - 5.3 mmol/L   Chloride 107 98 - 110 mmol/L   CO2 26 20 - 32 mmol/L   Calcium 9.2 8.6 - 10.3 mg/dL   Total Protein 6.8 6.1 - 8.1 g/dL   Albumin 4.1 3.6 - 5.1 g/dL   Globulin 2.7 1.9 - 3.7 g/dL (calc)   AG Ratio 1.5 1.0 - 2.5 (calc)   Total Bilirubin 1.5 (H) 0.2 - 1.2 mg/dL   Alkaline phosphatase (APISO) 50 40 - 115 U/L   AST 11 10 - 35 U/L   ALT 11 9 - 46 U/L  Bilirubin, Direct  Result Value Ref Range   Bilirubin, Direct 0.4 (H) 0.0 - 0.2 mg/dL      Assessment & Plan:   Problem List Items Addressed This Visit      Cardiovascular and Mediastinum   Essential hypertension (Chronic)    On the lower side today; discussed with patient, monitor or reduce medicine; DASH guidelines      Relevant Medications   terazosin (HYTRIN) 2 MG capsule   Arrhythmia, ventricular (Chronic)    On CCB; f/u with cardiologist      Relevant Medications   terazosin (HYTRIN) 2 MG capsule     Genitourinary   CKD (chronic kidney disease) stage 3, GFR 30-59 ml/min (HCC)    Limit or completely avoid NSAIDs; hydrate; check Cr        Other   Prediabetes    Not diabetic; check A1c      Relevant Orders   Hemoglobin A1c   Obesity    Encouraged weight loss; see AVS; try to lose 1/2 to  1 pound per week to get BMI under 30 as next goal      Medication monitoring encounter  (Chronic)    Monitor liver and kidneys      Relevant Orders   COMPLETE METABOLIC PANEL WITH GFR   CBC with Differential/Platelet   Hyperglycemia (Chronic)    Limit sweets; check A1c and glucose      Relevant Orders   Hemoglobin A1c   HLD (hyperlipidemia) (Chronic)    Check lipids today; limit saturated fats      Relevant Medications   terazosin (HYTRIN) 2 MG capsule   Other Relevant Orders   Lipid panel       Follow up plan: Return in about 6 months (around 04/09/2018) for follow-up visit with Dr. Sherie Don.  An after-visit summary was printed and given to the patient at check-out.  Please see the patient instructions which may contain other information and recommendations beyond what is mentioned above in the assessment and plan.  Meds ordered this encounter  Medications  . terazosin (HYTRIN) 2 MG capsule    Sig: Take 1 capsule (2 mg total) by mouth at bedtime.    Dispense:  30 capsule    Refill:  5    BP low, reducing dose    Orders Placed This Encounter  Procedures  . COMPLETE METABOLIC PANEL WITH GFR  . CBC with Differential/Platelet  . Hemoglobin A1c  . Lipid panel

## 2017-10-07 NOTE — Assessment & Plan Note (Signed)
On the lower side today; discussed with patient, monitor or reduce medicine; DASH guidelines

## 2017-10-07 NOTE — Assessment & Plan Note (Signed)
On CCB; f/u with cardiologist

## 2017-10-07 NOTE — Patient Instructions (Addendum)
STOP the terazosin (Hytrin) 5 mg Start the lower dose of 2 mg  Try unsweetened almond milk   Check out the information at familydoctor.org entitled "Nutrition for Weight Loss: What You Need to Know about Fad Diets" Try to lose between 1 pound per week by taking in fewer calories and burning off more calories You can succeed by limiting portions, limiting foods dense in calories and fat, becoming more active, and drinking 8 glasses of water a day (64 ounces) Don't skip meals, especially breakfast, as skipping meals may alter your metabolism Do not use over-the-counter weight loss pills or gimmicks that claim rapid weight loss A healthy BMI (or body mass index) is between 18.5 and 24.9 You can calculate your ideal BMI at the NIH website [employer].huhttp://www.nhlbi.nih.gov/health/educational/lose_wt/BMI/bmicalc.htm  Try to follow the DASH guidelines (DASH stands for Dietary Approaches to Stop Hypertension). Try to limit the sodium in your diet to no more than 1,500mg  of sodium per day. Certainly try to not exceed 2,000 mg per day at the very most. Do not add salt when cooking or at the table.  Check the sodium amount on labels when shopping, and choose items lower in sodium when given a choice. Avoid or limit foods that already contain a lot of sodium. Eat a diet rich in fruits and vegetables and whole grains, and try to lose weight if overweight or obese

## 2017-10-07 NOTE — Assessment & Plan Note (Signed)
Monitor liver and kidneys 

## 2017-10-07 NOTE — Assessment & Plan Note (Signed)
Limit or completely avoid NSAIDs; hydrate; check Cr

## 2017-10-07 NOTE — Assessment & Plan Note (Signed)
Not diabetic; check A1c

## 2017-10-07 NOTE — Assessment & Plan Note (Signed)
Check lipids today; limit saturated fats 

## 2017-10-08 LAB — COMPLETE METABOLIC PANEL WITH GFR
AG Ratio: 1.8 (calc) (ref 1.0–2.5)
ALKALINE PHOSPHATASE (APISO): 54 U/L (ref 40–115)
ALT: 13 U/L (ref 9–46)
AST: 14 U/L (ref 10–35)
Albumin: 4.1 g/dL (ref 3.6–5.1)
BILIRUBIN TOTAL: 0.9 mg/dL (ref 0.2–1.2)
BUN / CREAT RATIO: 15 (calc) (ref 6–22)
BUN: 19 mg/dL (ref 7–25)
CHLORIDE: 109 mmol/L (ref 98–110)
CO2: 22 mmol/L (ref 20–32)
CREATININE: 1.27 mg/dL — AB (ref 0.70–1.11)
Calcium: 9 mg/dL (ref 8.6–10.3)
GFR, Est African American: 59 mL/min/{1.73_m2} — ABNORMAL LOW (ref >=60)
GFR, Est Non African American: 51 mL/min/{1.73_m2} — ABNORMAL LOW (ref >=60)
GLUCOSE: 120 mg/dL (ref 65–139)
Globulin: 2.3 g/dL (calc) (ref 1.9–3.7)
Potassium: 4.1 mmol/L (ref 3.5–5.3)
Sodium: 142 mmol/L (ref 135–146)
Total Protein: 6.4 g/dL (ref 6.1–8.1)

## 2017-10-08 LAB — CBC WITH DIFFERENTIAL/PLATELET
BASOS PCT: 0.5 %
Basophils Absolute: 29 cells/uL (ref 0–200)
EOS PCT: 2.8 %
Eosinophils Absolute: 162 cells/uL (ref 15–500)
HEMATOCRIT: 49.8 % (ref 38.5–50.0)
Hemoglobin: 17.1 g/dL (ref 13.2–17.1)
LYMPHS ABS: 1299 {cells}/uL (ref 850–3900)
MCH: 31.4 pg (ref 27.0–33.0)
MCHC: 34.3 g/dL (ref 32.0–36.0)
MCV: 91.4 fL (ref 80.0–100.0)
MPV: 10.9 fL (ref 7.5–12.5)
Monocytes Relative: 10.1 %
NEUTROS ABS: 3724 {cells}/uL (ref 1500–7800)
NEUTROS PCT: 64.2 %
Platelets: 209 10*3/uL (ref 140–400)
RBC: 5.45 10*6/uL (ref 4.20–5.80)
RDW: 12.5 % (ref 11.0–15.0)
Total Lymphocyte: 22.4 %
WBC mixed population: 586 cells/uL (ref 200–950)
WBC: 5.8 10*3/uL (ref 3.8–10.8)

## 2017-10-08 LAB — HEMOGLOBIN A1C
EAG (MMOL/L): 5.2 (calc)
Hgb A1c MFr Bld: 4.9 % of total Hgb (ref <5.7)
Mean Plasma Glucose: 94 (calc)

## 2017-10-08 LAB — LIPID PANEL
Cholesterol: 122 mg/dL (ref <200)
HDL: 51 mg/dL (ref >40)
LDL CHOLESTEROL (CALC): 56 mg/dL
NON-HDL CHOLESTEROL (CALC): 71 mg/dL (ref <130)
TRIGLYCERIDES: 66 mg/dL (ref <150)
Total CHOL/HDL Ratio: 2.4 (calc) (ref <5.0)

## 2017-10-24 ENCOUNTER — Ambulatory Visit (INDEPENDENT_AMBULATORY_CARE_PROVIDER_SITE_OTHER): Payer: PPO

## 2017-10-24 VITALS — BP 128/60 | HR 60 | Temp 98.1°F | Resp 12 | Ht 73.0 in | Wt 242.6 lb

## 2017-10-24 DIAGNOSIS — Z0000 Encounter for general adult medical examination without abnormal findings: Secondary | ICD-10-CM | POA: Diagnosis not present

## 2017-10-24 NOTE — Progress Notes (Signed)
Subjective:   Jeffery Campbell is a 84 y.o. male who presents for Medicare Annual/Subsequent preventive examination.  Review of Systems:  N/A Cardiac Risk Factors include: advanced age (>58men, >17 women);male gender;dyslipidemia;hypertension;obesity (BMI >30kg/m2);sedentary lifestyle     Objective:    Vitals: BP 128/60 (BP Location: Left Arm, Patient Position: Sitting, Cuff Size: Large)   Pulse 60   Temp 98.1 F (36.7 C) (Oral)   Resp 12   Ht 6\' 1"  (1.854 m)   Wt 242 lb 9.6 oz (110 kg)   SpO2 92%   BMI 32.01 kg/m   Body mass index is 32.01 kg/m.  Advanced Directives 10/24/2017 03/21/2017 10/05/2016 03/30/2016 02/14/2016 01/10/2016 09/30/2015  Does Patient Have a Medical Advance Directive? Yes Yes Yes Yes Yes Yes Yes  Type of Estate agent of [place];Living will Healthcare Power of [place];Living will - - Living will Living will Living will  Does patient want to make changes to medical advance directive? - - - - No - Patient declined - -  Copy of Healthcare Power of Attorney in Chart? No - copy requested No - copy requested - - - No - copy requested No - copy requested  Would patient like information on creating a medical advance directive? - No - Patient declined - - - - -    Tobacco Social History   Tobacco Use  Smoking Status Never Smoker  Smokeless Tobacco Never Used  Tobacco Comment   smoking cessation materials not required     Counseling given: No Comment: smoking cessation materials not required  Clinical Intake:  Pre-visit preparation completed: Yes  Pain : No/denies pain   BMI - recorded: 32.01 Nutritional Status: BMI > 30  Obese Nutritional Risks: None Diabetes: No  How often do you need to have someone help you when you read instructions, pamphlets, or other written materials from your doctor or pharmacy?: 1 - Never  Interpreter Needed?: No  Information entered by :: AEversole, LPN  Past Medical History:  Diagnosis Date  .  Arrhythmia   . Cough    CHRONIC  . Depression   . Disturbance of sleep   . Diverticulitis   . GERD (gastroesophageal reflux disease)   . Hemorrhoids   . Hypertension   . OA (osteoarthritis)   . Pure hypercholesterolemia   . Stroke [facility])    Past Surgical History:  Procedure Laterality Date  . CATARACT EXTRACTION W/PHACO Left 02/14/2016   Procedure: CATARACT EXTRACTION PHACO AND INTRAOCULAR LENS PLACEMENT (IOC);  Surgeon: Galen Manila, MD;  Location: [facility] ORS;  Service: Ophthalmology;  Laterality: Left;  [place] 53.7 AP% 21.5 CDE 11.58 FLUID PACK LOT # [id number] H  . COLONOSCOPY    . KNEE ARTHROSCOPY    . TRANSURETHRAL RESECTION OF PROSTATE     Family History  Problem Relation Age of Onset  . Hypertension Son        Posey Rea of parents health.   Social History   Socioeconomic History  . Marital status: Divorced    Spouse name: Not on file  . Number of children: 3  . Years of education: Not on file  . Highest education level: 11th grade  Occupational History  . Occupation: Retired  Engineer, production  . Financial resource strain: Not hard at all  . Food insecurity:    Worry: Never true    Inability: Never true  . Transportation needs:    Medical: No    Non-medical: No  Tobacco Use  . Smoking status: Never Smoker  .  Smokeless tobacco: Never Used  . Tobacco comment: smoking cessation materials not required  Substance and Sexual Activity  . Alcohol use: No    Alcohol/week: 0.0 oz  . Drug use: No  . Sexual activity: Not Currently  Lifestyle  . Physical activity:    Days per week: 3 days    Minutes per session: 60 min  . Stress: Not at all  Relationships  . Social connections:    Talks on phone: Patient refused    Gets together: Patient refused    Attends religious service: Patient refused    Active member of club or organization: Patient refused    Attends meetings of clubs or organizations: Patient refused    Relationship status: Divorced  Other Topics Concern  .  Not on file  Social History Narrative   Lives at home alone.   Writes left-handed - does everything else with right-hand.   No caffeine use.    Outpatient Encounter Medications as of 10/24/2017  Medication Sig  . atorvastatin (LIPITOR) 20 MG tablet TAKE 1 TABLET BY MOUTH EVERYDAY AT BEDTIME  . azelastine (ASTELIN) 0.1 % nasal spray Place 2 sprays into both nostrils 2 (two) times daily. Use in each nostril as directed  . citalopram (CELEXA) 20 MG tablet TAKE 1 TABLET BY MOUTH EVERY DAY  . clopidogrel (PLAVIX) 75 MG tablet Take 1 tablet (75 mg total) by mouth daily. (stop aggrenox)  . Cyanocobalamin (B-12) 3000 MCG SUBL Place 1 each under the tongue daily.  [name] diltiazem (CARDIZEM CD) 120 MG 24 hr capsule TAKE ONE CAPSULE BY MOUTH DAILY  . docusate sodium (COLACE) 100 MG capsule Take 1 capsule (100 mg total) by mouth 2 (two) times daily.  . meclizine (ANTIVERT) 25 MG tablet TAKE 1/2 TO 1 TABLET (12.5-25 MG TOTAL) BY MOUTH 3 (THREE) TIMES DAILY AS NEEDED FOR DIZZINESS.  . Omega 3 1200 MG CAPS Take 1 capsule by mouth daily.   . ranitidine (ZANTAC) 150 MG tablet TAKE 1 TABLET (150 MG TOTAL) BY MOUTH AT BEDTIME. TO PROTECT STOMACH -- STOP LANSOPRAZOLE  . terazosin (HYTRIN) 2 MG capsule Take 1 capsule (2 mg total) by mouth at bedtime.   No facility-administered encounter medications on file as of 10/24/2017.     Activities of Daily Living In your present state of health, do you have any difficulty performing the following activities: 10/24/2017 10/07/2017  Hearing? N N  Comment denies hearing aids -  Vision? N N  Comment wears eyeglasses -  Difficulty concentrating or making decisions? N N  Walking or climbing stairs? N N  Dressing or bathing? N N  Doing errands, shopping? N N  Preparing Food and eating ? N -  Comment denies dentures -  Using the Toilet? N -  In the past six months, have you accidently leaked urine? N -  Do you have problems with loss of bowel control? N -  Managing your  Medications? N -  Managing your Finances? N -  Housekeeping or managing your Housekeeping? N -  Some recent data might be hidden    Patient Care Team: Lada, Janit Bern, MD as PCP - General (Family Medicine) Garen Grams, MD as Consulting Physician (Cardiology)   Assessment:   This is a routine wellness examination for Kinney.  Exercise Activities and Dietary recommendations Current Exercise Habits: Structured exercise class, Type of exercise: strength training/weights, Time (Minutes): 60, Frequency (Times/Week): 3, Weekly Exercise (Minutes/Week): 180, Intensity: Mild, Exercise limited by: None identified  Goals    .  DIET - INCREASE WATER INTAKE     Recommend to drink at least 6-8 8oz glasses of water per day.       Fall Risk Fall Risk  10/24/2017 10/07/2017 04/08/2017 03/21/2017 10/05/2016  Falls in the past year? No No No No No  Number falls in past yr: - - - - -  Injury with Fall? - - - - -  Risk for fall due to : Impaired vision - - - -  Risk for fall due to: Comment wears eyeglasses - - - -   FALL RISK PREVENTION PERTAINING TO HOME: Is your home free of loose throw rugs in walkways, pet beds, electrical cords, etc? Yes Is there adequate lighting in your home to reduce risk of falls?  Yes Are there stairs in or around your home WITH handrails? Yes  ASSISTIVE DEVICES UTILIZED TO PREVENT FALLS: Use of a cane, walker or w/c? No Grab bars in the bathroom? Yes  Shower chair or a place to sit while bathing? No An elevated toilet seat or a handicapped toilet? Yes  Timed Get Up and Go Performed: Yes. Pt ambulated 10 feet within 10 sec. Gait slow, steady and without the use of an assistive device. No intervention required at this time. Fall risk prevention has been discussed.  Community Resource Referral:  Pt declined my offer to send [employer] Referral to Care Guide for a shower chair.  Depression Screen PHQ 2/9 Scores 10/24/2017 10/07/2017 10/07/2017 04/08/2017  PHQ - 2  Score 0 0 0 0  PHQ- 9 Score 0 0 - -    Cognitive Function MMSE - Mini Mental State Exam 10/26/2014  Orientation to time 4  Orientation to Place 4  Registration 3  Attention/ Calculation 3  Recall 1  Language- name 2 objects 2  Language- repeat 1  Language- follow 3 step command 3  Language- read & follow direction 1  Write a sentence 1  Copy design 1  Total score 24     6CIT Screen 10/24/2017  What Year? 0 points  What month? 0 points  What time? 3 points  Count back from 20 0 points  Months in reverse 4 points  Repeat phrase 2 points  Total Score 9    Immunization History  Administered Date(s) Administered  . Influenza Split 02/17/2006, 12/30/2006, 01/20/2010  . Influenza, High Dose Seasonal PF 12/29/2014, 12/24/2016  . Influenza, Seasonal, Injecte, Preservative Fre 01/24/2011, 01/02/2012, 12/24/2012  . Influenza,inj,Quad PF,6+ Mos 12/09/2013  . Influenza-Unspecified 11/28/2015, 12/24/2016  . Pneumococcal Conjugate-13 01/25/2014, 03/17/2014  . Pneumococcal Polysaccharide-23 09/23/2006, 12/30/2006  . Td 02/26/2005  . Zoster 01/24/2011    Qualifies for Shingles Vaccine? Yes. Zostavax completed 01/24/11. Due for Shingrix. Education has been provided regarding the importance of this vaccine. Pt has been advised to call insurance company to determine out of pocket expense. Advised may also receive vaccine at local pharmacy or Health Dept. Verbalized acceptance and understanding.  Due for Tdap vaccine. Education has been provided regarding the importance of this vaccine. Advised may receive this vaccine at local pharmacy or Health Dept. Aware to provide a copy of the vaccination record if obtained from local pharmacy or Health Dept. Verbalized acceptance and understanding.   Screening Tests Health Maintenance  Topic Date Due  . TETANUS/TDAP  04/08/2018 (Originally 02/27/2015)  . URINE MICROALBUMIN  04/09/2018 (Originally 02/09/1939)  . INFLUENZA VACCINE  10/31/2017  .  HEMOGLOBIN A1C  04/09/2018  . FOOT EXAM  09/07/2018  . OPHTHALMOLOGY EXAM  09/07/2018  .  PNA vac Low Risk Adult  Completed   Cancer Screenings: Lung: Low Dose CT Chest recommended if Age 89-80 years, 30 pack-year currently smoking OR have quit w/in 15years. Patient does not qualify. Colorectal: No longer required  Additional Screenings: Hepatitis C Screening: Does not qualify     Plan:  I have personally reviewed and addressed the Medicare Annual Wellness questionnaire and have noted the following in the patient's chart:  A. Medical and social history B. Use of alcohol, tobacco or illicit drugs  C. Current medications and supplements D. Functional ability and status E.  Nutritional status F.  Physical activity G. Advance directives H. List of other physicians I.  Hospitalizations, surgeries, and ER visits in previous 12 months J.  Vitals K. Screenings such as hearing and vision if needed, cognitive and depression L. Referrals and appointments  In addition, I have reviewed and discussed with patient certain preventive protocols, quality metrics, and best practice recommendations. A written personalized care plan for preventive services as well as general preventive health recommendations were provided to patient.  See attached scanned questionnaire for additional information.   Signed,  Deon PillingAmmie Davion Flannery, LPN Nurse Health Advisor

## 2017-10-24 NOTE — Patient Instructions (Signed)
Jeffery Campbell , Thank you for taking time to come for your Medicare Wellness Visit. I appreciate your ongoing commitment to your health goals. Please review the following plan we discussed and let me know if I can assist you in the future.   Screening recommendations/referrals: Colorectal Screening: No longer required  Vision and Dental Exams: Recommended annual ophthalmology exams for early detection of glaucoma and other disorders of the eye Recommended annual dental exams for proper oral hygiene  Vaccinations: Influenza vaccine: Up to date Pneumococcal vaccine: Up to date Tdap vaccine: Declined. Please call your insurance company to determine your out of pocket expense. You may also receive this vaccine at your local pharmacy or Health Dept. Shingles vaccine: Please call your insurance company to determine your out of pocket expense for the Shingrix vaccine. You may also receive this vaccine at your local pharmacy or Health Dept.    Advanced directives: Please bring a copy of your POA (Power of Attorney) and/or Living Will to your next appointment.  Goals: Recommend to drink at least 6-8 8oz glasses of water per day.  Next appointment: Please schedule your Annual Wellness Visit with your Nurse Health Advisor in one year.  Preventive Care 1765 Years and Older, Male Preventive care refers to lifestyle choices and visits with your health care provider that can promote health and wellness. What does preventive care include?  A yearly physical exam. This is also called an annual well check.  Dental exams once or twice a year.  Routine eye exams. Ask your health care provider how often you should have your eyes checked.  Personal lifestyle choices, including:  Daily care of your teeth and gums.  Regular physical activity.  Eating a healthy diet.  Avoiding tobacco and drug use.  Limiting alcohol use.  Practicing safe sex.  Taking low doses of aspirin every day.  Taking vitamin  and mineral supplements as recommended by your health care provider. What happens during an annual well check? The services and screenings done by your health care provider during your annual well check will depend on your age, overall health, lifestyle risk factors, and family history of disease. Counseling  Your health care provider may ask you questions about your:  Alcohol use.  Tobacco use.  Drug use.  Emotional well-being.  Home and relationship well-being.  Sexual activity.  Eating habits.  History of falls.  Memory and ability to understand (cognition).  Work and work Astronomerenvironment. Screening  You may have the following tests or measurements:  Height, weight, and BMI.  Blood pressure.  Lipid and cholesterol levels. These may be checked every 5 years, or more frequently if you are over 82 years old.  Skin check.  Lung cancer screening. You may have this screening every year starting at age 82 if you have a 30-pack-year history of smoking and currently smoke or have quit within the past 15 years.  Fecal occult blood test (FOBT) of the stool. You may have this test every year starting at age 82.  Flexible sigmoidoscopy or colonoscopy. You may have a sigmoidoscopy every 5 years or a colonoscopy every 10 years starting at age 82.  Prostate cancer screening. Recommendations will vary depending on your family history and other risks.  Hepatitis C blood test.  Hepatitis B blood test.  Sexually transmitted disease (STD) testing.  Diabetes screening. This is done by checking your blood sugar (glucose) after you have not eaten for a while (fasting). You may have this done every 1-3 years.  Abdominal aortic aneurysm (AAA) screening. You may need this if you are a current or former smoker.  Osteoporosis. You may be screened starting at age 46 if you are at high risk. Talk with your health care provider about your test results, treatment options, and if necessary, the  need for more tests. Vaccines  Your health care provider may recommend certain vaccines, such as:  Influenza vaccine. This is recommended every year.  Tetanus, diphtheria, and acellular pertussis (Tdap, Td) vaccine. You may need a Td booster every 10 years.  Zoster vaccine. You may need this after age 109.  Pneumococcal 13-valent conjugate (PCV13) vaccine. One dose is recommended after age 56.  Pneumococcal polysaccharide (PPSV23) vaccine. One dose is recommended after age 68. Talk to your health care provider about which screenings and vaccines you need and how often you need them. This information is not intended to replace advice given to you by your health care provider. Make sure you discuss any questions you have with your health care provider. Document Released: 04/15/2015 Document Revised: 12/07/2015 Document Reviewed: 01/18/2015 Elsevier Interactive Patient Education  2017 [place] Prevention in the Home Falls can cause injuries. They can happen to people of all ages. There are many things you can do to make your home safe and to help prevent falls. What can I do on the outside of my home?  Regularly fix the edges of walkways and driveways and fix any cracks.  Remove anything that might make you trip as you walk through a door, such as a raised step or threshold.  Trim any bushes or trees on the path to your home.  Use bright outdoor lighting.  Clear any walking paths of anything that might make someone trip, such as rocks or tools.  Regularly check to see if handrails are loose or broken. Make sure that both sides of any steps have handrails.  Any raised decks and porches should have guardrails on the edges.  Have any leaves, snow, or ice cleared regularly.  Use sand or salt on walking paths during winter.  Clean up any spills in your garage right away. This includes oil or grease spills. What can I do in the bathroom?  Use night lights.  Install grab  bars by the toilet and in the tub and shower. Do not use towel bars as grab bars.  Use non-skid mats or decals in the tub or shower.  If you need to sit down in the shower, use a plastic, non-slip stool.  Keep the floor dry. Clean up any water that spills on the floor as soon as it happens.  Remove soap buildup in the tub or shower regularly.  Attach bath mats securely with double-sided non-slip rug tape.  Do not have throw rugs and other things on the floor that can make you trip. What can I do in the bedroom?  Use night lights.  Make sure that you have a light by your bed that is easy to reach.  Do not use any sheets or blankets that are too big for your bed. They should not hang down onto the floor.  Have a firm chair that has side arms. You can use this for support while you get dressed.  Do not have throw rugs and other things on the floor that can make you trip. What can I do in the kitchen?  Clean up any spills right away.  Avoid walking on wet floors.  Keep items that you use  a lot in easy-to-reach places.  If you need to reach something above you, use a strong step stool that has a grab bar.  Keep electrical cords out of the way.  Do not use floor polish or wax that makes floors slippery. If you must use wax, use non-skid floor wax.  Do not have throw rugs and other things on the floor that can make you trip. What can I do with my stairs?  Do not leave any items on the stairs.  Make sure that there are handrails on both sides of the stairs and use them. Fix handrails that are broken or loose. Make sure that handrails are as long as the stairways.  Check any carpeting to make sure that it is firmly attached to the stairs. Fix any carpet that is loose or worn.  Avoid having throw rugs at the top or bottom of the stairs. If you do have throw rugs, attach them to the floor with carpet tape.  Make sure that you have a light switch at the top of the stairs and the  bottom of the stairs. If you do not have them, ask someone to add them for you. What else can I do to help prevent falls?  Wear shoes that:  Do not have high heels.  Have rubber bottoms.  Are comfortable and fit you well.  Are closed at the toe. Do not wear sandals.  If you use a stepladder:  Make sure that it is fully opened. Do not climb a closed stepladder.  Make sure that both sides of the stepladder are locked into place.  Ask someone to hold it for you, if possible.  Clearly mark and make sure that you can see:  Any grab bars or handrails.  First and last steps.  Where the edge of each step is.  Use tools that help you move around (mobility aids) if they are needed. These include:  Canes.  Walkers.  Scooters.  Crutches.  Turn on the lights when you go into a dark area. Replace any light bulbs as soon as they burn out.  Set up your furniture so you have a clear path. Avoid moving your furniture around.  If any of your floors are uneven, fix them.  If there are any pets around you, be aware of where they are.  Review your medicines with your doctor. Some medicines can make you feel dizzy. This can increase your chance of falling. Ask your doctor what other things that you can do to help prevent falls. This information is not intended to replace advice given to you by your health care provider. Make sure you discuss any questions you have with your health care provider. Document Released: 01/13/2009 Document Revised: 08/25/2015 Document Reviewed: 04/23/2014 Elsevier Interactive Patient Education  2017 [employer].

## 2017-10-26 DIAGNOSIS — D751 Secondary polycythemia: Secondary | ICD-10-CM | POA: Diagnosis not present

## 2017-10-26 DIAGNOSIS — G4733 Obstructive sleep apnea (adult) (pediatric): Secondary | ICD-10-CM | POA: Diagnosis not present

## 2017-10-26 DIAGNOSIS — G4736 Sleep related hypoventilation in conditions classified elsewhere: Secondary | ICD-10-CM | POA: Diagnosis not present

## 2017-10-28 ENCOUNTER — Other Ambulatory Visit: Payer: Self-pay | Admitting: Family Medicine

## 2017-11-26 DIAGNOSIS — G4733 Obstructive sleep apnea (adult) (pediatric): Secondary | ICD-10-CM | POA: Diagnosis not present

## 2017-11-26 DIAGNOSIS — D751 Secondary polycythemia: Secondary | ICD-10-CM | POA: Diagnosis not present

## 2017-11-26 DIAGNOSIS — G4736 Sleep related hypoventilation in conditions classified elsewhere: Secondary | ICD-10-CM | POA: Diagnosis not present

## 2017-11-27 ENCOUNTER — Other Ambulatory Visit: Payer: Self-pay | Admitting: Family Medicine

## 2017-11-27 NOTE — Telephone Encounter (Signed)
Last appt:7/8   Next: jan

## 2017-11-29 ENCOUNTER — Encounter: Payer: Self-pay | Admitting: Pulmonary Disease

## 2017-11-29 ENCOUNTER — Ambulatory Visit (INDEPENDENT_AMBULATORY_CARE_PROVIDER_SITE_OTHER): Payer: PPO | Admitting: Pulmonary Disease

## 2017-11-29 ENCOUNTER — Other Ambulatory Visit: Payer: Self-pay | Admitting: Family Medicine

## 2017-11-29 VITALS — BP 162/100 | HR 66 | Resp 16 | Ht 73.0 in | Wt 245.0 lb

## 2017-11-29 DIAGNOSIS — D751 Secondary polycythemia: Secondary | ICD-10-CM

## 2017-11-29 DIAGNOSIS — G4734 Idiopathic sleep related nonobstructive alveolar hypoventilation: Secondary | ICD-10-CM

## 2017-11-29 DIAGNOSIS — G4733 Obstructive sleep apnea (adult) (pediatric): Secondary | ICD-10-CM

## 2017-11-29 DIAGNOSIS — I10 Essential (primary) hypertension: Secondary | ICD-10-CM

## 2017-11-29 DIAGNOSIS — E782 Mixed hyperlipidemia: Secondary | ICD-10-CM

## 2017-11-29 MED ORDER — DILTIAZEM HCL ER COATED BEADS 120 MG PO CP24: 120.0000 mg | ORAL_CAPSULE | Freq: Every day | ORAL | 1 refills | Status: DC

## 2017-11-29 NOTE — Telephone Encounter (Signed)
Last Ov: 7/8  Next:1/8

## 2017-11-29 NOTE — Telephone Encounter (Signed)
Pt is needing refill on his blood pressure medication to pharm is [facility]

## 2017-11-29 NOTE — Patient Instructions (Signed)
It is very important that you get back on oxygen with sleep You need to get back on your blood pressure medication, diltiazem (Cardizem).    Follow-up in this office as needed

## 2017-12-03 NOTE — Progress Notes (Signed)
PULMONARY OFFICE FOLLOW UP NOTE  Requesting MD/Service: Lada Date of initial consultation: 05/29/16 Reason for consultation:  Polycythemia  PT PROFILE: [name] never smoker referred for evaluation of polycythemia and nocturnal hypoxemia  PROBLEMS: Polycythemia Severe OSA with nocturnal hypoxemia  DATA: Overnight oximetry (RA) 04/10/16: Lowest SpO2 79%, Avg 25 desaturation events per hour Overnight oximetry (2 LPM ) 06/04/16: 161 desaturation events, 24 per hour. Lowest SPO2 82% Polysomnogram 07/10/16: Severe OSA with AHI of 40 per hour CPAP titration 07/31/16:Recommendation of AutoSet CPAP 10-20 cm H2O. PLM index 165.5 with arousal index of 4.9. Recommended check of ferritin level Overnight oximetry 06/03/17: SpO2 < 88% 155 min, 36 sec. Lowest SpO2 80%  SUBJ: Routine reevaluation for obstructive sleep apnea with significant nocturnal hypoxemia and secondary polycythemia.  He has been intolerant to nasal CPAP.  Despite my strong recommendation otherwise, he is still not wearing nocturnal oxygen.  He feels that his sleep quality is good.  He continues to deny all respiratory symptoms.  He denies daytime hypersomnolence.  Vitals:   11/29/17 0912  BP: (!) 162/100  Pulse: 66  Resp: 16  SpO2: 94%  Weight: 245 lb (111.1 kg)  Height: 6\' 1"  (1.854 m)  Room air   EXAM:  Gen: NAD HEENT: NCAT, sclera white Neck: No JVD Lungs: breath sounds full, no wheezes or other adventitious sounds Cardiovascular: RRR, no murmurs Abdomen: Soft, nontender, normal BS Ext: without clubbing, cyanosis, edema.  Bilateral varicosities present Neuro: grossly intact Skin: Limited exam, no lesions noted    DATA:   BMP Latest Ref Rng & Units 10/07/2017 04/08/2017 03/21/2017  Glucose 65 - 139 mg/dL 473 92 403(J)  BUN 7 - 25 mg/dL 19 12 14   Creatinine 0.70 - 1.11 mg/dL 0.96(K) 3.83(K) 1.84  BUN/Creat Ratio 6 - 22 (calc) 15 10 -  Sodium 135 - 146 mmol/L 142 140 138  Potassium 3.5 - 5.3 mmol/L 4.1 4.1  4.0  Chloride 98 - 110 mmol/L 109 107 107  CO2 20 - 32 mmol/L 22 26 21(L)  Calcium 8.6 - 10.3 mg/dL 9.0 9.2 8.9    CBC Latest Ref Rng & Units 10/07/2017 03/21/2017 10/12/2016  WBC 3.8 - 10.8 Thousand/uL 5.8 5.8 4.7  Hemoglobin 13.2 - 17.1 g/dL 03.7 54.3 60.6  Hematocrit 38.5 - 50.0 % 49.8 52.7(H) 48.9  Platelets 140 - 400 Thousand/uL 209 199 212    CXR:  NNF  IMPRESSION:     ICD-10-CM   1. Nocturnal hypoxemia G47.34   2. OSA (obstructive sleep apnea) G47.33   3. Polycythemia D75.1   4. Hypertension, unspecified type I10    He has been intolerant to CPAP.  He remains noncompliant with nocturnal oxygen.  Chronic nocturnal hypoxemia is the likely cause of polycythemia and it is probably contributing to hypertension.  Also of note, he indicates that he is no longer taking his antihypertensive medications (diltiazem).  PLAN:  I again emphasized the importance of compliance with nocturnal oxygen.  I strongly encouraged that he wear 2 LPM by nasal cannula with sleep.  I also encouraged that he resume diltiazem.  Follow-up in this office as needed    Billy Fischer, MD PCCM service Mobile [phone] Pager [phone] 12/03/2017 1:06 PM

## 2017-12-14 ENCOUNTER — Other Ambulatory Visit: Payer: Self-pay | Admitting: Family Medicine

## 2017-12-20 ENCOUNTER — Telehealth: Payer: Self-pay | Admitting: Family Medicine

## 2017-12-20 NOTE — Telephone Encounter (Signed)
There is not anything to send in without seeing him for an appointment.  If he feels he needs an appointment, please have him schedule. Thank you!

## 2017-12-20 NOTE — Telephone Encounter (Signed)
Copied from CRM [id number]. Topic: Quick Communication - See Telephone Encounter >> Dec 20, 2017  1:16 PM Arlyss Gandyichardson, Khyren Hing N, NT wrote: CRM for notification. See Telephone encounter for: 12/20/17. Pt wanting to know if something ordered for a runny nose without him coming in for an appointment. Please advise.

## 2017-12-20 NOTE — Telephone Encounter (Signed)
Pt notified states may call back Monday for an appointment

## 2017-12-27 ENCOUNTER — Ambulatory Visit: Payer: PPO | Admitting: Family Medicine

## 2017-12-27 DIAGNOSIS — D751 Secondary polycythemia: Secondary | ICD-10-CM | POA: Diagnosis not present

## 2017-12-27 DIAGNOSIS — G4736 Sleep related hypoventilation in conditions classified elsewhere: Secondary | ICD-10-CM | POA: Diagnosis not present

## 2017-12-27 DIAGNOSIS — G4733 Obstructive sleep apnea (adult) (pediatric): Secondary | ICD-10-CM | POA: Diagnosis not present

## 2018-01-10 ENCOUNTER — Encounter: Payer: Self-pay | Admitting: Family Medicine

## 2018-01-10 ENCOUNTER — Ambulatory Visit (INDEPENDENT_AMBULATORY_CARE_PROVIDER_SITE_OTHER): Payer: PPO | Admitting: Family Medicine

## 2018-01-10 VITALS — BP 134/70 | HR 64 | Temp 97.9°F | Ht 73.0 in | Wt 243.9 lb

## 2018-01-10 DIAGNOSIS — N183 Chronic kidney disease, stage 3 unspecified: Secondary | ICD-10-CM

## 2018-01-10 DIAGNOSIS — E8881 Metabolic syndrome: Secondary | ICD-10-CM | POA: Diagnosis not present

## 2018-01-10 DIAGNOSIS — R7303 Prediabetes: Secondary | ICD-10-CM

## 2018-01-10 DIAGNOSIS — Z8673 Personal history of transient ischemic attack (TIA), and cerebral infarction without residual deficits: Secondary | ICD-10-CM | POA: Diagnosis not present

## 2018-01-10 DIAGNOSIS — R718 Other abnormality of red blood cells: Secondary | ICD-10-CM | POA: Diagnosis not present

## 2018-01-10 DIAGNOSIS — Z6832 Body mass index (BMI) 32.0-32.9, adult: Secondary | ICD-10-CM

## 2018-01-10 DIAGNOSIS — E6609 Other obesity due to excess calories: Secondary | ICD-10-CM | POA: Diagnosis not present

## 2018-01-10 DIAGNOSIS — R339 Retention of urine, unspecified: Secondary | ICD-10-CM | POA: Diagnosis not present

## 2018-01-10 DIAGNOSIS — G4734 Idiopathic sleep related nonobstructive alveolar hypoventilation: Secondary | ICD-10-CM | POA: Diagnosis not present

## 2018-01-10 DIAGNOSIS — G8194 Hemiplegia, unspecified affecting left nondominant side: Secondary | ICD-10-CM

## 2018-01-10 DIAGNOSIS — N139 Obstructive and reflux uropathy, unspecified: Secondary | ICD-10-CM | POA: Diagnosis not present

## 2018-01-10 DIAGNOSIS — F3341 Major depressive disorder, recurrent, in partial remission: Secondary | ICD-10-CM | POA: Diagnosis not present

## 2018-01-10 DIAGNOSIS — E782 Mixed hyperlipidemia: Secondary | ICD-10-CM | POA: Diagnosis not present

## 2018-01-10 DIAGNOSIS — J301 Allergic rhinitis due to pollen: Secondary | ICD-10-CM

## 2018-01-10 LAB — CBC WITH DIFFERENTIAL/PLATELET
BASOS ABS: 40 {cells}/uL (ref 0–200)
Basophils Relative: 0.7 %
EOS ABS: 108 {cells}/uL (ref 15–500)
EOS PCT: 1.9 %
HEMATOCRIT: 49.7 % (ref 38.5–50.0)
HEMOGLOBIN: 17.2 g/dL — AB (ref 13.2–17.1)
LYMPHS ABS: 1328 {cells}/uL (ref 850–3900)
MCH: 31.4 pg (ref 27.0–33.0)
MCHC: 34.6 g/dL (ref 32.0–36.0)
MCV: 90.9 fL (ref 80.0–100.0)
MPV: 10.9 fL (ref 7.5–12.5)
Monocytes Relative: 10.8 %
NEUTROS ABS: 3608 {cells}/uL (ref 1500–7800)
Neutrophils Relative %: 63.3 %
Platelets: 212 10*3/uL (ref 140–400)
RBC: 5.47 10*6/uL (ref 4.20–5.80)
RDW: 12.5 % (ref 11.0–15.0)
Total Lymphocyte: 23.3 %
WBC mixed population: 616 cells/uL (ref 200–950)
WBC: 5.7 10*3/uL (ref 3.8–10.8)

## 2018-01-10 LAB — BASIC METABOLIC PANEL
BUN / CREAT RATIO: 9 (calc) (ref 6–22)
BUN: 11 mg/dL (ref 7–25)
CALCIUM: 9.3 mg/dL (ref 8.6–10.3)
CO2: 27 mmol/L (ref 20–32)
CREATININE: 1.21 mg/dL — AB (ref 0.70–1.11)
Chloride: 105 mmol/L (ref 98–110)
GLUCOSE: 86 mg/dL (ref 65–99)
Potassium: 4.2 mmol/L (ref 3.5–5.3)
SODIUM: 140 mmol/L (ref 135–146)

## 2018-01-10 MED ORDER — CITALOPRAM HYDROBROMIDE 20 MG PO TABS: ORAL_TABLET | ORAL | Status: DC

## 2018-01-10 NOTE — Assessment & Plan Note (Signed)
Check glucose and lpids; working on weight loss

## 2018-01-10 NOTE — Progress Notes (Signed)
BP 134/70   Pulse 64   Temp 97.9 F (36.6 C) (Oral)   Ht 6\' 1"  (1.854 m)   Wt 243 lb 14.4 oz (110.6 kg)   SpO2 95%   BMI 32.18 kg/m    Subjective:    Patient ID: Jeffery Campbell, male    DOB: October 25, 1928, 84 y.o.   MRN: [id number]  HPI: Jeffery Campbell is a 84 y.o. male  Chief Complaint  Patient presents with  . Follow-up  . Allergies    runny nose and eyes    HPI  He is here for regular follow-up  High cholesterol; on atorvastatin 20 mg; no muscle aches; eats a lot of hamburgers, he knows he shouldn't be eating those; goes to [employer] and gets the veggie plate; last lipid panel reviewed; last LDL 56  High blood pressure; does not add salt to the food at the table; on diltiazem; taking medicines every day  Allergic rhinitis; not sick  On hytrin for prostate; able to pass urine, but sometimes just a little comes out; dribbles a little bit; no odor; no difficulty starting urinating; no burning; sometimes he drips a little urine at times; not wearing a pad; just uses some cream down below; no hx of   No issues with constipation; not using the colace any more  Hx of stroke; no residua; on plavix; no bleeding  CKD stage 3; avoiding NSAIDs  Prediabetes; last A1c controlled Lab Results  Component Value Date   HGBA1C 4.9 10/07/2017   Hypoxia; not using oxygen at night like he is supposed to; agrees to start using it when discussed; last   Depression screen [facility] 2/9 01/10/2018 10/24/2017 10/07/2017 10/07/2017 04/08/2017  Decreased Interest 0 0 0 0 0  Down, Depressed, Hopeless 0 0 0 0 0  PHQ - 2 Score 0 0 0 0 0  Altered sleeping 0 0 0 - -  Tired, decreased energy 0 0 0 - -  Change in appetite 0 0 0 - -  Feeling bad or failure about yourself  0 0 0 - -  Trouble concentrating 0 0 0 - -  Moving slowly or fidgety/restless 0 0 0 - -  Suicidal thoughts 0 0 0 - -  PHQ-9 Score 0 0 0 - -  Difficult doing work/chores Not difficult at all Not difficult at all Not difficult at all - -   Fall Risk   01/10/2018 10/24/2017 10/07/2017 04/08/2017 03/21/2017  Falls in the past year? No No No No No  Number falls in past yr: - - - - -  Injury with Fall? - - - - -  Risk for fall due to : - Impaired vision - - -  Risk for fall due to: Comment - wears eyeglasses - - -    Relevant past medical, surgical, family and social history reviewed Past Medical History:  Diagnosis Date  . Arrhythmia   . Cough    CHRONIC  . Depression   . Disturbance of sleep   . Diverticulitis   . GERD (gastroesophageal reflux disease)   . Hemorrhoids   . Hypertension   . OA (osteoarthritis)   . Pure hypercholesterolemia   . Stroke [facility])    Past Surgical History:  Procedure Laterality Date  . CATARACT EXTRACTION W/PHACO Left 02/14/2016   Procedure: CATARACT EXTRACTION PHACO AND INTRAOCULAR LENS PLACEMENT (IOC);  Surgeon: Galen Manila, MD;  Location: [facility] ORS;  Service: Ophthalmology;  Laterality: Left;  [place] 53.7 AP% 21.5 CDE 11.58 FLUID PACK  LOT # [id number]  . COLONOSCOPY    . KNEE ARTHROSCOPY    . TRANSURETHRAL RESECTION OF PROSTATE     Family History  Problem Relation Age of Onset  . Hypertension Son        Posey Rea of parents health.   Social History   Tobacco Use  . Smoking status: Never Smoker  . Smokeless tobacco: Never Used  . Tobacco comment: smoking cessation materials not required  Substance Use Topics  . Alcohol use: No    Alcohol/week: 0.0 standard drinks  . Drug use: No     [facility] Visit from 01/10/2018 in [facility]  AUDIT-C Score  0      Interim medical history since last visit reviewed. Allergies and medications reviewed  Review of Systems Per HPI unless specifically indicated above     Objective:    BP 134/70   Pulse 64   Temp 97.9 F (36.6 C) (Oral)   Ht 6\' 1"  (1.854 m)   Wt 243 lb 14.4 oz (110.6 kg)   SpO2 95%   BMI 32.18 kg/m   Wt Readings from Last 3 Encounters:  01/10/18 243 lb 14.4 oz (110.6 kg)  11/29/17 245 lb (111.1 kg)  10/24/17  242 lb 9.6 oz (110 kg)    Physical Exam  Constitutional: He appears well-developed and well-nourished. No distress.  HENT:  Head: Normocephalic and atraumatic.  Eyes: EOM are normal. No scleral icterus.  Neck: No thyromegaly present.  Cardiovascular: Normal rate and regular rhythm.  Pulmonary/Chest: Effort normal and breath sounds normal.  Abdominal: Soft. Bowel sounds are normal. He exhibits no distension.  Musculoskeletal: He exhibits no edema.  Neurological: Coordination normal.  Skin: Skin is warm and dry. No pallor.  Psychiatric: He has a normal mood and affect. His behavior is normal. Judgment and thought content normal.    Results for orders placed or performed in visit on 10/08/17  HM DIABETES FOOT EXAM  Result Value Ref Range   HM Diabetic Foot Exam no retinopathy       Assessment & Plan:   Problem List Items Addressed This Visit      Respiratory   Hypoxia, sleep related    Not using oxygen at night like he is supposed to; encouraged him to use this      Relevant Orders   CBC with Differential/Platelet   Allergic rhinitis due to pollen    OTC meds without decongestants        Nervous and Auditory   Left hemiparesis (HCC) (Chronic)    He does admit to some left sided weakness following old stroke, 2016; not limiting him        Genitourinary   CKD (chronic kidney disease) stage 3, GFR 30-59 ml/min (HCC)   Relevant Orders   Basic metabolic panel     Other   Prediabetes    Last A1c was excellent; not check today; lose a little weight to slow progression to type 2 DM      Obesity    Encouraged weight loss; offered nutrition referral, but he declined; he will cut down portion sizes      Hx of completed stroke    Continue plavix; mild left-sided weakness not limiting his activities      HLD (hyperlipidemia) (Chronic)    Check lipids today; continue statin; limit saturarted fats      Elevated hematocrit    Check CBC and encouraged pt to use his  oxygen at night  Relevant Orders   CBC with Differential/Platelet   Dysmetabolic syndrome    Check glucose and lpids; working on weight loss      Clinical depression    He does not feel depressed; wean the medicine SSRI to half dose for 2 week then stop; patient will call me if getting depressed      Relevant Medications   citalopram (CELEXA) 20 MG tablet    Other Visit Diagnoses    Obstruction, uropathy    -  Primary   Relevant Orders   Ambulatory referral to Urology   Incomplete emptying of bladder       Relevant Orders   Ambulatory referral to Urology       Follow up plan: Return in about 3 months (around 04/12/2018) for follow-up visit with Dr. Sherie Don.  An after-visit summary was printed and given to the patient at check-out.  Please see the patient instructions which may contain other information and recommendations beyond what is mentioned above in the assessment and plan.  Meds ordered this encounter  Medications  . citalopram (CELEXA) 20 MG tablet    Sig: One-half of a pill by mouth daily for two weeks then STOP    Orders Placed This Encounter  Procedures  . Basic metabolic panel  . CBC with Differential/Platelet  . Ambulatory referral to Urology

## 2018-01-10 NOTE — Assessment & Plan Note (Signed)
He does not feel depressed; wean the medicine SSRI to half dose for 2 week then stop; patient will call me if getting depressed

## 2018-01-10 NOTE — Assessment & Plan Note (Signed)
Check CBC and encouraged pt to use his oxygen at night

## 2018-01-10 NOTE — Assessment & Plan Note (Signed)
Encouraged weight loss; offered nutrition referral, but he declined; he will cut down portion sizes

## 2018-01-10 NOTE — Assessment & Plan Note (Signed)
OTC meds without decongestants

## 2018-01-10 NOTE — Assessment & Plan Note (Signed)
Not using oxygen at night like he is supposed to; encouraged him to use this

## 2018-01-10 NOTE — Assessment & Plan Note (Signed)
Last A1c was excellent; not check today; lose a little weight to slow progression to type 2 DM

## 2018-01-10 NOTE — Patient Instructions (Addendum)
We'll have you see the urologist We'll get labs today If you have not heard anything from my staff in a week about any orders/referrals/studies from today, please contact us here to follow-up ([phone]) [phone]  Check out the information at familydoctor.org entitled "Nutrition for Weight Loss: What You Need to Know about Fad Diets" Try to lose between 1-2 pounds per week by taking in fewer calories and burning off more calories You can succeed by limiting portions, limiting foods dense in calories and fat, becoming more active, and drinking 8 glasses of water a day (64 ounces) Don't skip meals, especially breakfast, as skipping meals may alter your metabolism Do not use over-the-counter weight loss pills or gimmicks that claim rapid weight loss A healthy BMI (or body mass index) is between 18.5 and 24.9 You can calculate your ideal BMI at the NIH website JobEconomics.hu   Obesity, Adult Obesity is the condition of having too much total body fat. Being overweight or obese means that your weight is greater than what is considered healthy for your body size. Obesity is determined by a measurement called BMI. BMI is an estimate of body fat and is calculated from height and weight. For adults, a BMI of 30 or higher is considered obese. Obesity can eventually lead to other health concerns and major illnesses, including:  Stroke.  Coronary artery disease (CAD).  Type 2 diabetes.  Some types of cancer, including cancers of the colon, breast, uterus, and gallbladder.  Osteoarthritis.  High blood pressure (hypertension).  High cholesterol.  Sleep apnea.  Gallbladder stones.  Infertility problems.  What are the causes? The main cause of obesity is taking in (consuming) more calories than your body uses for energy. Other factors that contribute to this condition may include:  Being born with genes that make you more likely to become  obese.  Having a medical condition that causes obesity. These conditions include: ? Hypothyroidism. ? Polycystic ovarian syndrome (PCOS). ? Binge-eating disorder. ? Cushing syndrome.  Taking certain medicines, such as steroids, antidepressants, and seizure medicines.  Not being physically active (sedentary lifestyle).  Living where there are limited places to exercise safely or buy healthy foods.  Not getting enough sleep.  What increases the risk? The following factors may increase your risk of this condition:  Having a family history of obesity.  Being a woman of African-American descent.  Being a man of Hispanic descent.  What are the signs or symptoms? Having excessive body fat is the main symptom of this condition. How is this diagnosed? This condition may be diagnosed based on:  Your symptoms.  Your medical history.  A physical exam. Your health care provider may measure: ? Your BMI. If you are an adult with a BMI between 25 and less than 30, you are considered overweight. If you are an adult with a BMI of 30 or higher, you are considered obese. ? The distances around your hips and your waist (circumferences). These may be compared to each other to help diagnose your condition. ? Your skinfold thickness. Your health care provider may gently pinch a fold of your skin and measure it.  How is this treated? Treatment for this condition often includes changing your lifestyle. Treatment may include some or all of the following:  Dietary changes. Work with your health care provider and a dietitian to set a weight-loss goal that is healthy and reasonable for you. Dietary changes may include eating: ? Smaller portions. A portion size is the amount of a  particular food that is healthy for you to eat at one time. This varies from person to person. ? Low-calorie or low-fat options. ? More whole grains, fruits, and vegetables.  Regular physical activity. This may include  aerobic activity (cardio) and strength training.  Medicine to help you lose weight. Your health care provider may prescribe medicine if you are unable to lose 1 pound a week after 6 weeks of eating more healthily and doing more physical activity.  Surgery. Surgical options may include gastric banding and gastric bypass. Surgery may be done if: ? Other treatments have not helped to improve your condition. ? You have a BMI of 40 or higher. ? You have life-threatening health problems related to obesity.  Follow these instructions at home:  Eating and drinking   Follow recommendations from your health care provider about what you eat and drink. Your health care provider may advise you to: ? Limit fast foods, sweets, and processed snack foods. ? Choose low-fat options, such as low-fat milk instead of whole milk. ? Eat 5 or more servings of fruits or vegetables every day. ? Eat at home more often. This gives you more control over what you eat. ? Choose healthy foods when you eat out. ? Learn what a healthy portion size is. ? Keep low-fat snacks on hand. ? Avoid sugary drinks, such as soda, fruit juice, iced tea sweetened with sugar, and flavored milk. ? Eat a healthy breakfast.  Drink enough water to keep your urine clear or pale yellow.  Do not go without eating for long periods of time (do not fast) or follow a fad diet. Fasting and fad diets can be unhealthy and even dangerous. Physical Activity  Exercise regularly, as told by your health care provider. Ask your health care provider what types of exercise are safe for you and how often you should exercise.  Warm up and stretch before being active.  Cool down and stretch after being active.  Rest between periods of activity. Lifestyle  Limit the time that you spend in front of your TV, computer, or video game system.  Find ways to reward yourself that do not involve food.  Limit alcohol intake to no more than 1 drink a day for  nonpregnant women and 2 drinks a day for men. One drink equals 12 oz of beer, 5 oz of wine, or 1 oz of hard liquor. General instructions  Keep a weight loss journal to keep track of the food you eat and how much you exercise you get.  Take over-the-counter and prescription medicines only as told by your health care provider.  Take vitamins and supplements only as told by your health care provider.  Consider joining a support group. Your health care provider may be able to recommend a support group.  Keep all follow-up visits as told by your health care provider. This is important. Contact a health care provider if:  You are unable to meet your weight loss goal after 6 weeks of dietary and lifestyle changes. This information is not intended to replace advice given to you by your health care provider. Make sure you discuss any questions you have with your health care provider. Document Released: 04/26/2004 Document Revised: 08/22/2015 Document Reviewed: 01/05/2015 Elsevier Interactive Patient Education  2018 Elsevier Inc.  Preventing Unhealthy [employer], Adult Staying at a healthy weight is important. When fat builds up in your body, you may become overweight or obese. These conditions put you at greater risk for developing  certain health problems, such as heart disease, diabetes, sleeping problems, joint problems, and some cancers. Unhealthy weight gain is often the result of making unhealthy choices in what you eat. It is also a result of not getting enough exercise. You can make changes to your lifestyle to prevent obesity and stay as healthy as possible. What nutrition changes can be made? To maintain a healthy weight and prevent obesity:  Eat only as much as your body needs. To do this: ? Pay attention to signs that you are hungry or full. Stop eating as soon as you feel full. ? If you feel hungry, try drinking water first. Drink enough water so your urine is clear or pale  yellow. ? Eat smaller portions. ? Look at serving sizes on food labels. Most foods contain more than one serving per container. ? Eat the recommended amount of calories for your gender and activity level. While most active people should eat around 2,000 calories per day, if you are trying to lose weight or are not very active, you main need to eat less calories. Talk to your health care provider or dietitian about how many calories you should eat each day.  Choose healthy foods, such as: ? Fruits and vegetables. Try to fill at least half of your plate at each meal with fruits and vegetables. ? Whole grains, such as whole wheat bread, brown rice, and quinoa. ? Lean meats, such as chicken or fish. ? Other healthy proteins, such as beans, eggs, or tofu. ? Healthy fats, such as nuts, seeds, fatty fish, and olive oil. ? Low-fat or fat-free dairy.  Check food labels and avoid food and drinks that: ? Are high in calories. ? Have added sugar. ? Are high in sodium. ? Have saturated fats or trans fats.  Limit how much you eat of the following foods: ? Prepackaged meals. ? Fast food. ? Fried foods. ? Processed meat, such as bacon, sausage, and deli meats. ? Fatty cuts of red meat and poultry with skin.  Cook foods in healthier ways, such as by baking, broiling, or grilling.  When grocery shopping, try to shop around the outside of the store. This helps you buy mostly fresh foods and avoid canned and prepackaged foods.  What lifestyle changes can be made?  Exercise at least 30 minutes 5 or more days each week. Exercising includes brisk walking, yard work, biking, running, swimming, and team sports like basketball and soccer. Ask your health care provider which exercises are safe for you.  Do not use any products that contain nicotine or tobacco, such as cigarettes and e-cigarettes. If you need help quitting, ask your health care provider.  Limit alcohol intake to no more than 1 drink a day  for nonpregnant women and 2 drinks a day for men. One drink equals 12 oz of beer, 5 oz of wine, or 1 oz of hard liquor.  Try to get 7-9 hours of sleep each night. What other changes can be made?  Keep a food and activity journal to keep track of: ? What you ate and how many calories you had. Remember to count sauces, dressings, and side dishes. ? Whether you were active, and what exercises you did. ? Your calorie, weight, and activity goals.  Check your weight regularly. Track any changes. If you notice you have gained weight, make changes to your diet or activity routine.  Avoid taking weight-loss medicines or supplements. Talk to your health care provider before starting any new medicine  or supplement.  Talk to your health care provider before trying any new diet or exercise plan. Why are these changes important? Eating healthy, staying active, and having healthy habits not only help prevent obesity, they also:  Help you to manage stress and emotions.  Help you to connect with friends and family.  Improve your self-esteem.  Improve your sleep.  Prevent long-term health problems.  What can happen if changes are not made? Being obese or overweight can cause you to develop joint or bone problems, which can make it hard for you to stay active or do activities you enjoy. Being obese or overweight also puts stress on your heart and lungs and can lead to health problems like diabetes, heart disease, and some cancers. Where to find more information: Talk with your health care provider or a dietitian about healthy eating and healthy lifestyle choices. You may also find other information through these resources:  U.S. Department of Agriculture MyPlate: https://ball-collins.biz/  American Heart Association: www.heart.org  Centers for Disease Control and Prevention: FootballExhibition.com.br  Summary  Staying at a healthy weight is important. It helps prevent certain diseases and health problems, such  as heart disease, diabetes, joint problems, sleep disorders, and some cancers.  Being obese or overweight can cause you to develop joint or bone problems, which can make it hard for you to stay active or do activities you enjoy.  You can prevent unhealthy weight gain by eating a healthy diet, exercising regularly, not smoking, limiting alcohol, and getting enough sleep.  Talk with your health care provider or a dietitian for guidance about healthy eating and healthy lifestyle choices. This information is not intended to replace advice given to you by your health care provider. Make sure you discuss any questions you have with your health care provider. Document Released: 03/20/2016 Document Revised: 04/25/2016 Document Reviewed: 04/25/2016 Elsevier Interactive Patient Education  [employer].

## 2018-01-10 NOTE — Assessment & Plan Note (Signed)
Check lipids today; continue statin; limit saturarted fats

## 2018-01-10 NOTE — Assessment & Plan Note (Addendum)
He does admit to some left sided weakness following old stroke, 2016; not limiting him

## 2018-01-10 NOTE — Assessment & Plan Note (Addendum)
Continue plavix; mild left-sided weakness not limiting his activities

## 2018-01-22 ENCOUNTER — Other Ambulatory Visit: Payer: Self-pay | Admitting: Family Medicine

## 2018-01-23 ENCOUNTER — Ambulatory Visit (INDEPENDENT_AMBULATORY_CARE_PROVIDER_SITE_OTHER): Payer: PPO | Admitting: Urology

## 2018-01-23 ENCOUNTER — Encounter: Payer: Self-pay | Admitting: Urology

## 2018-01-23 VITALS — BP 149/81 | HR 68 | Ht 73.0 in | Wt 242.7 lb

## 2018-01-23 DIAGNOSIS — N138 Other obstructive and reflux uropathy: Secondary | ICD-10-CM

## 2018-01-23 DIAGNOSIS — N401 Enlarged prostate with lower urinary tract symptoms: Secondary | ICD-10-CM | POA: Diagnosis not present

## 2018-01-23 LAB — MICROSCOPIC EXAMINATION
BACTERIA UA: NONE SEEN
RBC MICROSCOPIC, UA: NONE SEEN /HPF (ref 0–2)

## 2018-01-23 LAB — URINALYSIS, COMPLETE
Bilirubin, UA: NEGATIVE
Glucose, UA: NEGATIVE
Ketones, UA: NEGATIVE
Nitrite, UA: NEGATIVE
PH UA: 5.5 (ref 5.0–7.5)
Protein, UA: NEGATIVE
Specific Gravity, UA: 1.02 (ref 1.005–1.030)
Urobilinogen, Ur: 1 mg/dL (ref 0.2–1.0)

## 2018-01-23 LAB — BLADDER SCAN AMB NON-IMAGING: Scan Result: 17

## 2018-01-23 MED ORDER — ALFUZOSIN HCL ER 10 MG PO TB24: 10.0000 mg | ORAL_TABLET | Freq: Every day | ORAL | 3 refills | Status: DC

## 2018-01-23 MED ORDER — FINASTERIDE 5 MG PO TABS: 5.0000 mg | ORAL_TABLET | Freq: Every day | ORAL | 3 refills | Status: DC

## 2018-01-23 NOTE — Progress Notes (Signed)
01/23/2018 3:33 PM   Jeffery Campbell [birth year] [id number]  Referring provider: Kerman Passey, MD [address] [place], [place] [address]  Chief Complaint  Patient presents with  . incomplete bladder emptying    HPI: Patient is an 84 year old Caucasian male who was referred by Dr. Cammie Sickle for obstructive uropathy.  He is taking 2 mg of Hytrin nightly.  His UA is negative.   His PVR is 17 mL.       He is complaining of nocturia.  He states he is dribbling urine for a short time on an intermittent basis.    Patient denies any gross hematuria, dysuria or suprapubic/flank pain.  Patient denies any fevers, chills, nausea or vomiting.    PMH: Past Medical History:  Diagnosis Date  . Arrhythmia   . Cough    CHRONIC  . Depression   . Disturbance of sleep   . Diverticulitis   . GERD (gastroesophageal reflux disease)   . Hemorrhoids   . Hypertension   . OA (osteoarthritis)   . Pure hypercholesterolemia   . Stroke [facility])     Surgical History: Past Surgical History:  Procedure Laterality Date  . CATARACT EXTRACTION W/PHACO Left 02/14/2016   Procedure: CATARACT EXTRACTION PHACO AND INTRAOCULAR LENS PLACEMENT (IOC);  Surgeon: Galen Manila, MD;  Location: [facility] ORS;  Service: Ophthalmology;  Laterality: Left;  [place] 53.7 AP% 21.5 CDE 11.58 FLUID PACK LOT # [id number] H  . COLONOSCOPY    . KNEE ARTHROSCOPY    . TRANSURETHRAL RESECTION OF PROSTATE      Home Medications:  Allergies as of 01/23/2018   No Known Allergies     Medication List        Accurate as of 01/23/18  3:33 PM. Always use your most recent med list.          alfuzosin 10 MG 24 hr tablet Commonly known as:  UROXATRAL Take 1 tablet (10 mg total) by mouth daily with breakfast.   atorvastatin 20 MG tablet Commonly known as:  LIPITOR TAKE 1 TABLET BY MOUTH EVERYDAY AT BEDTIME   azelastine 0.1 % nasal spray Commonly known as:  ASTELIN Place 2 sprays into both nostrils 2 (two) times  daily. Use in each nostril as directed   B-12 3000 MCG Subl Place 1 each under the tongue daily.   citalopram 20 MG tablet Commonly known as:  CELEXA One-half of a pill by mouth daily for two weeks then STOP   clopidogrel 75 MG tablet Commonly known as:  PLAVIX TAKE 1 TABLET (75 MG TOTAL) BY MOUTH DAILY. (STOP AGGRENOX)   diltiazem 120 MG 24 hr capsule Commonly known as:  CARDIZEM CD Take 1 capsule (120 mg total) by mouth daily.   finasteride 5 MG tablet Commonly known as:  PROSCAR Take 1 tablet (5 mg total) by mouth daily.   meclizine 25 MG tablet Commonly known as:  ANTIVERT TAKE 1/2 TO 1 TABLET (12.5-25 MG TOTAL) BY MOUTH 3 (THREE) TIMES DAILY AS NEEDED FOR DIZZINESS.   Omega 3 1200 MG Caps Take 1 capsule by mouth daily.   ranitidine 150 MG tablet Commonly known as:  ZANTAC TAKE 1 TABLET (150 MG TOTAL) BY MOUTH AT BEDTIME. TO PROTECT STOMACH -- STOP LANSOPRAZOLE   terazosin 2 MG capsule Commonly known as:  HYTRIN Take 1 capsule (2 mg total) by mouth at bedtime.       Allergies: No Known Allergies  Family History: Family History  Problem Relation Age of Onset  .  Hypertension Son        Posey Rea of parents health.  . Prostate cancer Neg Hx   . Bladder Cancer Neg Hx   . Kidney cancer Neg Hx     Social History:  reports that he has never smoked. He has never used smokeless tobacco. He reports that he does not drink alcohol or use drugs.  ROS: UROLOGY Frequent Urination?: No Hard to postpone urination?: No Burning/pain with urination?: No Get up at night to urinate?: Yes Leakage of urine?: No Urine stream starts and stops?: No Trouble starting stream?: No Do you have to strain to urinate?: No Blood in urine?: No Urinary tract infection?: No Sexually transmitted disease?: No Injury to kidneys or bladder?: No Painful intercourse?: No Weak stream?: No Erection problems?: No Penile pain?: No  Gastrointestinal Nausea?: No Vomiting?:  No Indigestion/heartburn?: No Diarrhea?: No Constipation?: No  Constitutional Fever: No Night sweats?: No Weight loss?: No Fatigue?: No  Skin Skin rash/lesions?: No Itching?: No  Eyes Blurred vision?: No Double vision?: No  Ears/Nose/Throat Sore throat?: No Sinus problems?: No  Hematologic/Lymphatic Swollen glands?: No Easy bruising?: No  Cardiovascular Leg swelling?: No Chest pain?: No  Respiratory Cough?: No Shortness of breath?: No  Endocrine Excessive thirst?: No  Musculoskeletal Back pain?: No Joint pain?: No  Neurological Headaches?: No Dizziness?: No  Psychologic Depression?: No Anxiety?: No  Physical Exam: BP (!) 149/81 (BP Location: Left Arm, Patient Position: Sitting, Cuff Size: Large)   Pulse 68   Ht 6\' 1"  (1.854 m)   Wt 242 lb 11.2 oz (110.1 kg)   BMI 32.02 kg/m   Constitutional:  Well nourished. Alert and oriented, No acute distress. HEENT: [place] AT, moist mucus membranes.  Trachea midline, no masses. Cardiovascular: No clubbing, cyanosis, or edema. Respiratory: Normal respiratory effort, no increased work of breathing. GI: Abdomen is soft, non tender, non distended, no abdominal masses. Liver and spleen not palpable.  No hernias appreciated.  Stool sample for occult testing is not indicated.   GU: No CVA tenderness.  No bladder fullness or masses.  Patient with uncircumcised phallus.  Foreskin easily retracted  Urethral meatus is patent.  No penile discharge. No penile lesions or rashes. Scrotum without lesions, cysts, rashes and/or edema.  Testicles are located scrotally bilaterally. No masses are appreciated in the testicles. Left and right epididymis are normal. Rectal: Patient with  normal sphincter tone. Anus and perineum without scarring or rashes. No rectal masses are appreciated. Prostate is approximately 60+ grams, could only palpate the apex,  no nodules are appreciated. Skin: No rashes, bruises or suspicious lesions. Lymph: No  cervical or inguinal adenopathy. Neurologic: Grossly intact, no focal deficits, moving all 4 extremities. Psychiatric: Normal mood and affect.  Laboratory Data: Lab Results  Component Value Date   WBC 5.7 01/10/2018   HGB 17.2 (H) 01/10/2018   HCT 49.7 01/10/2018   MCV 90.9 01/10/2018   PLT 212 01/10/2018    Lab Results  Component Value Date   CREATININE 1.21 (H) 01/10/2018    No results found for: PSA  No results found for: TESTOSTERONE  Lab Results  Component Value Date   HGBA1C 4.9 10/07/2017    Lab Results  Component Value Date   TSH 2.090 12/29/2014       Component Value Date/Time   CHOL 122 10/07/2017 0838   CHOL 134 09/22/2014 0810   HDL 51 10/07/2017 0838   HDL 46 09/22/2014 0810   CHOLHDL 2.4 10/07/2017 0838   VLDL 9 10/12/2016  0813   LDLCALC 56 10/07/2017 0838    Lab Results  Component Value Date   AST 14 10/07/2017   Lab Results  Component Value Date   ALT 13 10/07/2017   No components found for: ALKALINEPHOPHATASE No components found for: BILIRUBINTOTAL  No results found for: ESTRADIOL  Urinalysis Negative.  See Epic.   I have reviewed the labs.   Pertinent Imaging: Results for GARI, HARTSELL (MRN [id number]) as of 01/23/2018 15:34  Ref. Range 01/23/2018 15:10  Scan Result Unknown 17    Assessment & Plan:    1. BPH with LUTS Continue conservative management, avoiding bladder irritants and timed voiding's Most bothersome symptoms is/are dribbling Initiate alpha-blocker (alfuzosin 10 mg), discussed side effects  Initiate 5 alpha reductase inhibitor (finsateride 5 mg ), discussed side effects  RTC in 1 month for I PSS and PVR   Return in about 1 month (around 02/23/2018) for IPSS and PVR.  These notes generated with voice recognition software. I apologize for typographical errors.  Michiel Cowboy, PA-C  [facility] [address]  [address] [place], [place] [address] [phone]

## 2018-01-26 DIAGNOSIS — D751 Secondary polycythemia: Secondary | ICD-10-CM | POA: Diagnosis not present

## 2018-01-26 DIAGNOSIS — G4733 Obstructive sleep apnea (adult) (pediatric): Secondary | ICD-10-CM | POA: Diagnosis not present

## 2018-01-26 DIAGNOSIS — G4736 Sleep related hypoventilation in conditions classified elsewhere: Secondary | ICD-10-CM | POA: Diagnosis not present

## 2018-01-27 ENCOUNTER — Encounter: Payer: Self-pay | Admitting: Emergency Medicine

## 2018-01-27 ENCOUNTER — Emergency Department
Admission: EM | Admit: 2018-01-27 | Discharge: 2018-01-27 | Disposition: A | Discharge status: Home / Self Care | Payer: PPO | Attending: Emergency Medicine | Admitting: Emergency Medicine

## 2018-01-27 ENCOUNTER — Other Ambulatory Visit: Payer: Self-pay

## 2018-01-27 ENCOUNTER — Telehealth: Payer: Self-pay | Admitting: Internal Medicine

## 2018-01-27 ENCOUNTER — Emergency Department: Payer: PPO

## 2018-01-27 DIAGNOSIS — Z7902 Long term (current) use of antithrombotics/antiplatelets: Secondary | ICD-10-CM | POA: Diagnosis not present

## 2018-01-27 DIAGNOSIS — Z8673 Personal history of transient ischemic attack (TIA), and cerebral infarction without residual deficits: Secondary | ICD-10-CM | POA: Diagnosis not present

## 2018-01-27 DIAGNOSIS — R42 Dizziness and giddiness: Secondary | ICD-10-CM | POA: Diagnosis not present

## 2018-01-27 DIAGNOSIS — N183 Chronic kidney disease, stage 3 (moderate): Secondary | ICD-10-CM | POA: Insufficient documentation

## 2018-01-27 DIAGNOSIS — I129 Hypertensive chronic kidney disease with stage 1 through stage 4 chronic kidney disease, or unspecified chronic kidney disease: Secondary | ICD-10-CM | POA: Insufficient documentation

## 2018-01-27 DIAGNOSIS — Z79899 Other long term (current) drug therapy: Secondary | ICD-10-CM | POA: Insufficient documentation

## 2018-01-27 DIAGNOSIS — F329 Major depressive disorder, single episode, unspecified: Secondary | ICD-10-CM | POA: Diagnosis not present

## 2018-01-27 LAB — COMPREHENSIVE METABOLIC PANEL
ALBUMIN: 4.4 g/dL (ref 3.5–5.0)
ALK PHOS: 49 U/L (ref 38–126)
ALT: 15 U/L (ref 0–44)
ANION GAP: 8 (ref 5–15)
AST: 20 U/L (ref 15–41)
BUN: 13 mg/dL (ref 8–23)
CALCIUM: 9.3 mg/dL (ref 8.9–10.3)
CO2: 26 mmol/L (ref 22–32)
Chloride: 108 mmol/L (ref 98–111)
Creatinine, Ser: 1.24 mg/dL (ref 0.61–1.24)
GFR calc Af Amer: 59 mL/min — ABNORMAL LOW (ref >60)
GFR calc non Af Amer: 51 mL/min — ABNORMAL LOW (ref >60)
Glucose, Bld: 109 mg/dL — ABNORMAL HIGH (ref 70–99)
Potassium: 3.6 mmol/L (ref 3.5–5.1)
SODIUM: 142 mmol/L (ref 135–145)
TOTAL PROTEIN: 7.9 g/dL (ref 6.5–8.1)
Total Bilirubin: 2.3 mg/dL — ABNORMAL HIGH (ref 0.3–1.2)

## 2018-01-27 LAB — CBC WITH DIFFERENTIAL/PLATELET
ABS IMMATURE GRANULOCYTES: 0.04 10*3/uL (ref 0.00–0.07)
BASOS ABS: 0 10*3/uL (ref 0.0–0.1)
Basophils Relative: 1 %
EOS PCT: 2 %
Eosinophils Absolute: 0.2 10*3/uL (ref 0.0–0.5)
HCT: 55.5 % — ABNORMAL HIGH (ref 39.0–52.0)
HEMOGLOBIN: 19.2 g/dL — AB (ref 13.0–17.0)
IMMATURE GRANULOCYTES: 1 %
LYMPHS PCT: 17 %
Lymphs Abs: 1.2 10*3/uL (ref 0.7–4.0)
MCH: 31.4 pg (ref 26.0–34.0)
MCHC: 34.6 g/dL (ref 30.0–36.0)
MCV: 90.7 fL (ref 80.0–100.0)
Monocytes Absolute: 0.6 10*3/uL (ref 0.1–1.0)
Monocytes Relative: 9 %
NEUTROS ABS: 4.8 10*3/uL (ref 1.7–7.7)
NEUTROS PCT: 70 %
NRBC: 0 % (ref 0.0–0.2)
Platelets: 218 10*3/uL (ref 150–400)
RBC: 6.12 MIL/uL — AB (ref 4.22–5.81)
RDW: 11.9 % (ref 11.5–15.5)
WBC: 6.8 10*3/uL (ref 4.0–10.5)

## 2018-01-27 LAB — TROPONIN I

## 2018-01-27 NOTE — ED Notes (Signed)
Spoke with Dr. Lenard Lance about patient and his symptoms.  Cardiac Labs and Head CT advised.

## 2018-01-27 NOTE — ED Notes (Signed)
NAD noted at time of D/C. Pt taken to lobby via wheelchair by his son. Denies any comments/concerns regarding D/C instructions.

## 2018-01-27 NOTE — ED Notes (Addendum)
First Nurse Note: Patient to ED via [facility] with complaint of dizziness this AM.  Hx of same X 1.5 weeks, states he could not walk this AM.  Sitting upright in WC.  VS per EMS - 163/92, 76, 16.  95% on Room Air.

## 2018-01-27 NOTE — ED Provider Notes (Signed)
[facility] Emergency Department Provider Note  Time seen: 9:52 AM  I have reviewed the triage vital signs and the nursing notes.   HISTORY  Chief Complaint Dizziness    HPI Jeffery Campbell is a 84 y.o. male with a past medical history of depression, gastric reflux, hypertension, CVA, hyperlipidemia, presents to the emergency department for intermittent dizziness.  According to the patient over the past 1 month he has had intermittent episodes of dizziness which she describes more of a lightheaded sensation.  States symptoms are worse first thing in the morning.  States usually he sits up on the side of the bed and the symptoms past however today they seemed more significant he attempted to get up and felt off balance.  However since arriving to the emergency department he states all of his symptoms have resolved.  States this has been an ongoing issue he has been following up with his primary care doctor about.  They have encouraged him to drink more water and use his CPAP he admits he has not been using his CPAP at night.  Patient denies any headache, weakness numbness or confusion.  Denies any slurred speech.  No recent illnesses.  Largely negative review of systems.   Past Medical History:  Diagnosis Date  . Arrhythmia   . Cough    CHRONIC  . Depression   . Disturbance of sleep   . Diverticulitis   . GERD (gastroesophageal reflux disease)   . Hemorrhoids   . Hypertension   . OA (osteoarthritis)   . Pure hypercholesterolemia   . Stroke [facility])     Patient Active Problem List   Diagnosis Date Noted  . Hx of completed stroke 01/10/2018  . Prediabetes 10/07/2017  . Dizziness 07/03/2017  . Hyperglycemia 10/17/2016  . Abnormal gait 10/05/2016  . Hypoxia, sleep related 04/20/2016  . Elevated hematocrit 04/04/2016  . Medication monitoring encounter 03/30/2016  . Right flank pain 01/10/2016  . General unsteadiness 05/24/2015  . Allergic rhinitis due to pollen  11/12/2014  . Back pain, chronic 10/21/2014  . Benign paroxysmal positional nystagmus 10/21/2014  . Decreased libido 10/21/2014  . Clinical depression 10/21/2014  . Fatigue 10/21/2014  . Gastro-esophageal reflux disease without esophagitis 10/21/2014  . Dysmetabolic syndrome 10/21/2014  . Fungal infection of nail 10/21/2014  . Arrhythmia, ventricular 10/21/2014  . Left hemiparesis (HCC)   . HLD (hyperlipidemia) 08/19/2014  . CKD (chronic kidney disease) stage 3, GFR 30-59 ml/min (HCC) 08/19/2014  . Obesity   . Asymptomatic PVCs 05/27/2014  . Essential hypertension 04/05/2014  . Osteoarthrosis involving more than one site but not generalized 08/03/2008    Past Surgical History:  Procedure Laterality Date  . CATARACT EXTRACTION W/PHACO Left 02/14/2016   Procedure: CATARACT EXTRACTION PHACO AND INTRAOCULAR LENS PLACEMENT (IOC);  Surgeon: Galen Manila, MD;  Location: [facility] ORS;  Service: Ophthalmology;  Laterality: Left;  [place] 53.7 AP% 21.5 CDE 11.58 FLUID PACK LOT # [id number] H  . COLONOSCOPY    . KNEE ARTHROSCOPY    . TRANSURETHRAL RESECTION OF PROSTATE      Prior to Admission medications   Medication Sig Start Date End Date Taking? Authorizing Provider  alfuzosin (UROXATRAL) 10 MG 24 hr tablet Take 1 tablet (10 mg total) by mouth daily with breakfast. 01/23/18   [name], Carollee Herter A, PA-C  atorvastatin (LIPITOR) 20 MG tablet TAKE 1 TABLET BY MOUTH EVERYDAY AT BEDTIME 04/14/17   Lada, Janit Bern, MD  citalopram (CELEXA) 20 MG tablet One-half of a pill by  mouth daily for two weeks then STOP 01/10/18   Lada, Janit Bern, MD  clopidogrel (PLAVIX) 75 MG tablet TAKE 1 TABLET (75 MG TOTAL) BY MOUTH DAILY. (STOP AGGRENOX) 11/27/17   Poulose, Percell Belt, NP  Cyanocobalamin (B-12) 3000 MCG SUBL Place 1 each under the tongue daily. 10/05/16   Kerman Passey, MD  diltiazem (CARDIZEM CD) 120 MG 24 hr capsule Take 1 capsule (120 mg total) by mouth daily. 11/29/17   Poulose, Percell Belt, NP  finasteride  (PROSCAR) 5 MG tablet Take 1 tablet (5 mg total) by mouth daily. 01/23/18   [name], Wellington Hampshire, PA-C  meclizine (ANTIVERT) 25 MG tablet TAKE 1/2 TO 1 TABLET (12.5-25 MG TOTAL) BY MOUTH 3 (THREE) TIMES DAILY AS NEEDED FOR DIZZINESS. 01/22/18   Lada, Janit Bern, MD  Omega 3 1200 MG CAPS Take 1 capsule by mouth daily.     [provider]  ranitidine (ZANTAC) 150 MG tablet TAKE 1 TABLET (150 MG TOTAL) BY MOUTH AT BEDTIME. TO PROTECT STOMACH -- STOP LANSOPRAZOLE 12/16/17   Lada, Janit Bern, MD  terazosin (HYTRIN) 2 MG capsule Take 1 capsule (2 mg total) by mouth at bedtime. 10/07/17   Kerman Passey, MD    No Known Allergies  Family History  Problem Relation Age of Onset  . Hypertension Son        Posey Rea of parents health.  . Prostate cancer Neg Hx   . Bladder Cancer Neg Hx   . Kidney cancer Neg Hx     Social History Social History   Tobacco Use  . Smoking status: Never Smoker  . Smokeless tobacco: Never Used  . Tobacco comment: smoking cessation materials not required  Substance Use Topics  . Alcohol use: No    Alcohol/week: 0.0 standard drinks  . Drug use: No    Review of Systems Constitutional: Negative for fever.  Lightheadedness, now resolved. Eyes: Negative for visual complaints ENT: Negative for recent illness/congestion Cardiovascular: Negative for chest pain. Respiratory: Negative for shortness of breath. Gastrointestinal: Negative for abdominal pain, vomiting and diarrhea. Genitourinary: Negative for urinary compaints Musculoskeletal: Negative for musculoskeletal complaints Skin: Negative for skin complaints  Neurological: Negative for headache.  Denies any focal weakness numbness confusion or slurred speech. All other ROS negative  ____________________________________________   PHYSICAL EXAM:  VITAL SIGNS: ED Triage Vitals  Enc Vitals Group     BP 01/27/18 0803 (!) 154/62     Pulse Rate 01/27/18 0803 71     Resp 01/27/18 0803 20     Temp 01/27/18 0803  (!) 97.4 F (36.3 C)     Temp Source 01/27/18 0803 Oral     SpO2 01/27/18 0803 94 %     Weight 01/27/18 0819 239 lb (108.4 kg)     Height 01/27/18 0819 6\' 1"  (1.854 m)     Head Circumference --      Peak Flow --      Pain Score 01/27/18 0819 0     Pain Loc --      Pain Edu? --      Excl. in GC? --    Constitutional: Alert and oriented. Well appearing and in no distress. Eyes: Normal exam ENT   Head: Normocephalic and atraumatic.   Mouth/Throat: Mucous membranes are moist. Cardiovascular: Normal rate, regular rhythm.  Respiratory: Normal respiratory effort without tachypnea nor retractions. Breath sounds are clear Gastrointestinal: Soft and nontender. No distention.   Musculoskeletal: Nontender with normal range of motion in all extremities.  Neurologic:  Normal speech and language. No gross focal neurologic deficits.  Equal grip strengths.  Cranial nerves appear intact. Skin:  Skin is warm, dry and intact.  Psychiatric: Mood and affect are normal.  ____________________________________________    EKG  EKG reviewed and interpreted by myself shows normal sinus rhythm at 62 bpm with a slightly widened QRS, left axis deviation, largely normal intervals with nonspecific ST changes.  ____________________________________________    RADIOLOGY  CT negative for acute abnormality  ____________________________________________   INITIAL IMPRESSION / ASSESSMENT AND PLAN / ED COURSE  Pertinent labs & imaging results that were available during my care of the patient were reviewed by me and considered in my medical decision making (see chart for details).  She presents to the emergency department for dizziness ongoing greater than 1 month, episodic describes as dizziness/lightheadedness sensation.  Appears to occur mostly in the mornings especially upon getting up.  Differential would include orthostatic hypotension, vasovagal event, near syncope, dehydration, hypovolemia,  metabolic abnormality, vertigo, CVA.  Overall the patient appears very well, denies any symptoms at this time.  Patient's physical exam is reassuring, great strength in all extremities 5/5 motor in all extremities.  Patient's labs have shown an elevated H&H with a current hematocrit of 55.  In reviewing the patient's labs and old records this appears to be trending upwards.  I discussed the patient with Dr. Donneta Romberg of hematology.  Believes that the patient is currently asymptomatic he can follow-up in the office this week for further work-up and possible phlebotomy.  I also discussed with the patient importance of wearing his CPAP at night as this might help his elevated hematocrit as well.  Patient agreeable to plan of care.  I also discussed the need to increase fluids especially first thing in the morning.  Patient will follow-up with his primary care doctor as well as Dr. Donneta Romberg.  I discussed return precautions.  ____________________________________________   FINAL CLINICAL IMPRESSION(S) / ED DIAGNOSES  Dizziness    Minna Antis, MD 01/27/18 [phone]

## 2018-01-27 NOTE — Telephone Encounter (Signed)
Spoke to ER- needs follow up with me this week/phlebotomy; 9:30 on 30th Oct. No lab.

## 2018-01-27 NOTE — Discharge Instructions (Signed)
As we discussed please follow-up with hematology/oncology by calling the number provided.  Please drink plenty of fluids each day especially first thing in the morning.  Please wear your CPAP while sleeping at night.  Return to the emergency department for any return of significant dizziness/lightheadedness, any weakness/numbness/confusion or slurred speech.

## 2018-01-27 NOTE — Telephone Encounter (Signed)
That's fine- Thank you. GBB

## 2018-01-27 NOTE — ED Notes (Signed)
Patient to Rm 18

## 2018-01-27 NOTE — ED Triage Notes (Signed)
Patient states he woke up this AM and felt dizziness, states it feels like the room is spinning.  Hx of same X 1.5 weeks, worse this AM at 0500.  States he could not walk when he woke up.  At this time, patient states "I feel pretty good".  Denies N&V.  Alert and oriented. Color good.  Skin warm and dry.  EKG has been collected and read.

## 2018-01-28 ENCOUNTER — Telehealth: Payer: Self-pay | Admitting: Family Medicine

## 2018-01-28 NOTE — Telephone Encounter (Signed)
Pt states he will take one from urinologist he already called them

## 2018-01-28 NOTE — Telephone Encounter (Signed)
Copied from CRM [id number]. Topic: Quick Communication - See Telephone Encounter >> Jan 28, 2018  8:56 AM Arlyss Gandy, NT wrote: CRM for notification. See Telephone encounter for: 01/28/18. Pt would like to see if he is suppose to take the terazosin (HYTRIN) 2 MG capsule and alfuzosin (UROXATRAL) 10 MG 24 hr tablet together? Or which one he should be taking. Please advise.

## 2018-01-28 NOTE — Telephone Encounter (Signed)
Pt calling to check status  °

## 2018-01-28 NOTE — Telephone Encounter (Signed)
Please contact urologist office for clarification- if they wanted to stop Hytrin and use alfuzosin as a replacement

## 2018-01-31 ENCOUNTER — Other Ambulatory Visit: Payer: Self-pay

## 2018-01-31 ENCOUNTER — Inpatient Hospital Stay: Payer: PPO | Attending: Internal Medicine | Admitting: Internal Medicine

## 2018-01-31 ENCOUNTER — Encounter: Payer: Self-pay | Admitting: Internal Medicine

## 2018-01-31 ENCOUNTER — Inpatient Hospital Stay: Payer: PPO

## 2018-01-31 VITALS — BP 141/77 | HR 83 | Temp 98.4°F | Resp 18 | Wt 243.1 lb

## 2018-01-31 VITALS — BP 118/72 | HR 73 | Resp 20

## 2018-01-31 DIAGNOSIS — M1990 Unspecified osteoarthritis, unspecified site: Secondary | ICD-10-CM | POA: Diagnosis not present

## 2018-01-31 DIAGNOSIS — H04122 Dry eye syndrome of left lacrimal gland: Secondary | ICD-10-CM | POA: Diagnosis not present

## 2018-01-31 DIAGNOSIS — Z8673 Personal history of transient ischemic attack (TIA), and cerebral infarction without residual deficits: Secondary | ICD-10-CM | POA: Diagnosis not present

## 2018-01-31 DIAGNOSIS — E7800 Pure hypercholesterolemia, unspecified: Secondary | ICD-10-CM | POA: Insufficient documentation

## 2018-01-31 DIAGNOSIS — D751 Secondary polycythemia: Secondary | ICD-10-CM | POA: Diagnosis not present

## 2018-01-31 DIAGNOSIS — I10 Essential (primary) hypertension: Secondary | ICD-10-CM | POA: Diagnosis not present

## 2018-01-31 DIAGNOSIS — R5383 Other fatigue: Secondary | ICD-10-CM

## 2018-01-31 DIAGNOSIS — Z79899 Other long term (current) drug therapy: Secondary | ICD-10-CM | POA: Insufficient documentation

## 2018-01-31 DIAGNOSIS — R42 Dizziness and giddiness: Secondary | ICD-10-CM | POA: Diagnosis not present

## 2018-01-31 DIAGNOSIS — Z7902 Long term (current) use of antithrombotics/antiplatelets: Secondary | ICD-10-CM | POA: Insufficient documentation

## 2018-01-31 LAB — COMPREHENSIVE METABOLIC PANEL
ALT: 14 U/L (ref 0–44)
AST: 19 U/L (ref 15–41)
Albumin: 4 g/dL (ref 3.5–5.0)
Alkaline Phosphatase: 46 U/L (ref 38–126)
Anion gap: 5 (ref 5–15)
BILIRUBIN TOTAL: 1.9 mg/dL — AB (ref 0.3–1.2)
BUN: 15 mg/dL (ref 8–23)
CHLORIDE: 109 mmol/L (ref 98–111)
CO2: 25 mmol/L (ref 22–32)
CREATININE: 1.04 mg/dL (ref 0.61–1.24)
Calcium: 9.1 mg/dL (ref 8.9–10.3)
Glucose, Bld: 107 mg/dL — ABNORMAL HIGH (ref 70–99)
POTASSIUM: 3.7 mmol/L (ref 3.5–5.1)
Sodium: 139 mmol/L (ref 135–145)
TOTAL PROTEIN: 6.8 g/dL (ref 6.5–8.1)

## 2018-01-31 LAB — CBC WITH DIFFERENTIAL/PLATELET
Abs Immature Granulocytes: 0.01 10*3/uL (ref 0.00–0.07)
BASOS ABS: 0 10*3/uL (ref 0.0–0.1)
Basophils Relative: 1 %
EOS ABS: 0.1 10*3/uL (ref 0.0–0.5)
Eosinophils Relative: 2 %
HCT: 49.6 % (ref 39.0–52.0)
Hemoglobin: 17 g/dL (ref 13.0–17.0)
Immature Granulocytes: 0 %
Lymphocytes Relative: 20 %
Lymphs Abs: 1.2 10*3/uL (ref 0.7–4.0)
MCH: 30.6 pg (ref 26.0–34.0)
MCHC: 34.3 g/dL (ref 30.0–36.0)
MCV: 89.4 fL (ref 80.0–100.0)
Monocytes Absolute: 0.6 10*3/uL (ref 0.1–1.0)
Monocytes Relative: 10 %
NRBC: 0 % (ref 0.0–0.2)
Neutro Abs: 4 10*3/uL (ref 1.7–7.7)
Neutrophils Relative %: 67 %
Platelets: 216 10*3/uL (ref 150–400)
RBC: 5.55 MIL/uL (ref 4.22–5.81)
RDW: 11.8 % (ref 11.5–15.5)
WBC: 5.9 10*3/uL (ref 4.0–10.5)

## 2018-01-31 LAB — LACTATE DEHYDROGENASE: LDH: 149 U/L (ref 98–192)

## 2018-01-31 NOTE — Progress Notes (Signed)
[facility] CONSULT NOTE  Patient Care Team: Lada, Janit Bern, MD as PCP - General (Family Medicine) Garen Grams, MD as Consulting Physician (Cardiology)  CHIEF COMPLAINTS/PURPOSE OF CONSULTATION: Erythrocytosis  # Erythrocytosis  # mini-stroke/   No history exists.     HISTORY OF PRESENTING ILLNESS:  Jeffery Campbell 84 y.o.  male been referred to [place] for further evaluation recommendations for erythrocytosis.  Patient was recently evaluated in the emergency form for dizziness.  Imaging negative for any acute process.  However incidentally hematocrit elevated.  Patient denies any prior history of strokes or blood clots.  Denies any tingling or numbness in the feet.  Complains of fatigue. No weight loss.  Appetite is good.  No night sweats.  Review of Systems  Constitutional: Negative for chills, diaphoresis, fever, malaise/fatigue and weight loss.  HENT: Negative for nosebleeds and sore throat.   Eyes: Negative for double vision.  Respiratory: Negative for cough, hemoptysis, sputum production, shortness of breath and wheezing.   Cardiovascular: Negative for chest pain, palpitations, orthopnea and leg swelling.  Gastrointestinal: Negative for abdominal pain, blood in stool, constipation, diarrhea, heartburn, melena, nausea and vomiting.  Genitourinary: Negative for dysuria, frequency and urgency.  Musculoskeletal: Negative for back pain and joint pain.  Skin: Negative.  Negative for itching and rash.  Neurological: Positive for dizziness. Negative for tingling, focal weakness, weakness and headaches.  Endo/Heme/Allergies: Does not bruise/bleed easily.  Psychiatric/Behavioral: Negative for depression. The patient is not nervous/anxious and does not have insomnia.      MEDICAL HISTORY:  Past Medical History:  Diagnosis Date  . Arrhythmia   . Cough    CHRONIC  . Depression   . Disturbance of sleep   . Diverticulitis   . GERD (gastroesophageal reflux  disease)   . Hemorrhoids   . Hypertension   . OA (osteoarthritis)   . Pure hypercholesterolemia   . Stroke [facility])     SURGICAL HISTORY: Past Surgical History:  Procedure Laterality Date  . CATARACT EXTRACTION W/PHACO Left 02/14/2016   Procedure: CATARACT EXTRACTION PHACO AND INTRAOCULAR LENS PLACEMENT (IOC);  Surgeon: Galen Manila, MD;  Location: [facility] ORS;  Service: Ophthalmology;  Laterality: Left;  [place] 53.7 AP% 21.5 CDE 11.58 FLUID PACK LOT # [id number] H  . COLONOSCOPY    . KNEE ARTHROSCOPY    . TRANSURETHRAL RESECTION OF PROSTATE      SOCIAL HISTORY: Social History   Socioeconomic History  . Marital status: Divorced    Spouse name: Not on file  . Number of children: 3  . Years of education: Not on file  . Highest education level: 11th grade  Occupational History  . Occupation: Retired  Engineer, production  . Financial resource strain: Not hard at all  . Food insecurity:    Worry: Never true    Inability: Never true  . Transportation needs:    Medical: No    Non-medical: No  Tobacco Use  . Smoking status: Never Smoker  . Smokeless tobacco: Never Used  . Tobacco comment: smoking cessation materials not required  Substance and Sexual Activity  . Alcohol use: No    Alcohol/week: 0.0 standard drinks  . Drug use: No  . Sexual activity: Not Currently  Lifestyle  . Physical activity:    Days per week: 3 days    Minutes per session: 60 min  . Stress: Not at all  Relationships  . Social connections:    Talks on phone: Patient refused    Gets together: Patient refused  Attends religious service: Patient refused    Active member of club or organization: Patient refused    Attends meetings of clubs or organizations: Patient refused    Relationship status: Divorced  . Intimate partner violence:    Fear of current or ex partner: No    Emotionally abused: No    Physically abused: No    Forced sexual activity: No  Other Topics Concern  . Not on file  Social History  Narrative   Lives at home alone.   Writes left-handed - does everything else with right-hand.   No caffeine use.      Never smoker; never alcohol; used to be truck driver; works part time in Systems analyst course. 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     FAMILY HISTORY: Family History  Problem Relation Age of Onset  . Hypertension Son        Posey Rea of parents health.  . Prostate cancer Neg Hx   . Bladder Cancer Neg Hx   . Kidney cancer Neg Hx     ALLERGIES:  has No Known Allergies.  MEDICATIONS:  Current Outpatient Medications  Medication Sig Dispense Refill  . alfuzosin (UROXATRAL) 10 MG 24 hr tablet Take 1 tablet (10 mg total) by mouth daily with breakfast. 90 tablet 3  . atorvastatin (LIPITOR) 20 MG tablet TAKE 1 TABLET BY MOUTH EVERYDAY AT BEDTIME (Patient taking differently: Take 20 mg by mouth daily. ) 90 tablet 3  . clopidogrel (PLAVIX) 75 MG tablet TAKE 1 TABLET (75 MG TOTAL) BY MOUTH DAILY. (STOP AGGRENOX) (Patient taking differently: Take 75 mg by mouth daily. ) 90 tablet 3  . Cyanocobalamin (B-12) 3000 MCG SUBL Place 3,000 mcg under the tongue daily.  30 tablet 11  . diltiazem (CARDIZEM CD) 120 MG 24 hr capsule Take 1 capsule (120 mg total) by mouth daily. 90 capsule 1  . finasteride (PROSCAR) 5 MG tablet Take 1 tablet (5 mg total) by mouth daily. 90 tablet 3  . ranitidine (ZANTAC) 150 MG tablet TAKE 1 TABLET (150 MG TOTAL) BY MOUTH AT BEDTIME. TO PROTECT STOMACH -- STOP LANSOPRAZOLE (Patient taking differently: Take 150 mg by mouth at bedtime. ) 90 tablet 1  . terazosin (HYTRIN) 2 MG capsule Take 1 capsule (2 mg total) by mouth at bedtime. 30 capsule 5  . meclizine (ANTIVERT) 25 MG tablet TAKE 1/2 TO 1 TABLET (12.5-25 MG TOTAL) BY MOUTH 3 (THREE) TIMES DAILY AS NEEDED FOR DIZZINESS. (Patient not taking: No sig reported) 30 tablet 0  . Omega 3 1200 MG CAPS Take 1,200 mg by mouth daily.      No current facility-administered medications for this visit.       [name]  PHYSICAL EXAMINATION: ECOG  PERFORMANCE STATUS: 1 - Symptomatic but completely ambulatory  Vitals:   01/31/18 1339  BP: (!) 141/77  Pulse: 83  Resp: 18  Temp: 98.4 F (36.9 C)   Filed Weights   01/31/18 1339  Weight: 243 lb 2 oz (110.3 kg)    Physical Exam  Constitutional: He is oriented to person, place, and time and well-developed, well-nourished, and in no distress.  HENT:  Head: Normocephalic and atraumatic.  Mouth/Throat: Oropharynx is clear and moist. No oropharyngeal exudate.  Eyes: Pupils are equal, round, and reactive to light.  Neck: Normal range of motion. Neck supple.  Cardiovascular: Normal rate and regular rhythm.  Pulmonary/Chest: No respiratory distress. He has no wheezes.  Abdominal: Soft. Bowel sounds are normal. He exhibits no distension and no mass. There is no  tenderness. There is no rebound and no guarding.  Musculoskeletal: Normal range of motion. He exhibits no edema or tenderness.  Neurological: He is alert and oriented to person, place, and time.  Skin: Skin is warm.  Psychiatric: Affect normal.     LABORATORY DATA:  I have reviewed the data as listed Lab Results  Component Value Date   WBC 5.9 01/31/2018   HGB 17.0 01/31/2018   HCT 49.6 01/31/2018   MCV 89.4 01/31/2018   PLT 216 01/31/2018   Recent Labs    03/21/17 1000  04/08/17 0829 10/07/17 0838 01/10/18 1044 01/27/18 0802 01/31/18 1444  NA 138  --  140 142 140 142 139  K 4.0  --  4.1 4.1 4.2 3.6 3.7  CL 107  --  107 109 105 108 109  CO2 21*  --  26 22 27 26 25   GLUCOSE 109*  --  92 120 86 109* 107*  BUN 14  --  12 19 11 13 15   CREATININE 1.11   < > 1.17* 1.27* 1.21* 1.24 1.04  CALCIUM 8.9  --  9.2 9.0 9.3 9.3 9.1  GFRNONAA 58*   < > 56* 51*  --  51* >60  GFRAA >60   < > 65 59*  --  59* >60  PROT 7.0  --  6.8 6.4  --  7.9 6.8  ALBUMIN 4.1  --   --   --   --  4.4 4.0  AST 21  --  11 14  --  20 19  ALT 15*  --  11 13  --  15 14  ALKPHOS 45  --   --   --   --  49 46  BILITOT 1.6*  --  1.5* 0.9  --  2.3*  1.9*  BILIDIR  --   --  0.4*  --   --   --   --    < > = values in this interval not displayed.    RADIOGRAPHIC STUDIES: I have personally reviewed the radiological images as listed and agreed with the findings in the report. Dg Chest 2 View  Result Date: 02/05/2018 CLINICAL DATA:  Fatigue EXAM: CHEST - 2 VIEW COMPARISON:  08/13/2016 FINDINGS: There is mild bilateral interstitial thickening likely chronic. There is no significant interval change. There is no focal consolidation. There is no pleural effusion or pneumothorax. The heart and mediastinal contours are unremarkable. The osseous structures are unremarkable. IMPRESSION: No active cardiopulmonary disease. Electronically Signed   By: Elige Ko   On: 02/05/2018 10:18   Ct Head Wo Contrast  Result Date: 01/27/2018 CLINICAL DATA:  84 year old male with a history of dizziness EXAM: CT HEAD WITHOUT CONTRAST TECHNIQUE: Contiguous axial images were obtained from the base of the skull through the vertex without intravenous contrast. COMPARISON:  CT 03/21/2017, MR 03/21/2017 FINDINGS: Brain: No acute intracranial hemorrhage. No midline shift or mass effect. Gray-white differentiation is maintained. Similar appearance of focal hypodensity in the right lateral thalamus/internal capsule. Unremarkable ventricular system. Vascular: Calcifications of the intracranial vasculature. Skull: No fracture.  No aggressive bony lesion. Sinuses/Orbits: Trace mucosal disease of the right maxillary sinus. Left lens extraction. Otherwise unremarkable orbits. Other: None IMPRESSION: Negative for acute intracranial abnormality. Redemonstration of remote lacunar infarct of the right thalamus. Associated intracranial atherosclerosis. Electronically Signed   By: Gilmer Mor D.O.   On: 01/27/2018 08:48    ASSESSMENT & PLAN:   Erythrocytosis # Erythrocytosis isolated-rule out polycythemia vera.  Check  Jak-2/testing erythropoietin levels.  #I had a long discussion with  patient regarding multiple causes of elevated hemoglobin-primary bone marrow process versus secondary causes.  Discussed that phlebotomy is usually recommended for polycythemia vera to avoid risk of stroke/thromboembolic events. Role phlebotomy in secondary polycythemia is controversial.  Phlebotomy could be offered for symptom control, however the goal should be to identify the underlying cause/and treat the cause.   #Dizzy spells unclear etiology question related to erythrocytosis.  Currently improved.  # DISPOSITION: # phlebotomy today/labs today; CXR in 1 week.  # Follow up in 2 weeks/MD/lab- H&H/possible Phlebtotomy- Dr.B    All questions were answered. The patient knows to call the clinic with any problems, questions or concerns.       Earna Coder, MD 02/09/2018 6:19 PM

## 2018-01-31 NOTE — Assessment & Plan Note (Addendum)
#   Erythrocytosis isolated-rule out polycythemia vera.  Check Jak-2/testing erythropoietin levels.  #I had a long discussion with patient regarding multiple causes of elevated hemoglobin-primary bone marrow process versus secondary causes.  Discussed that phlebotomy is usually recommended for polycythemia vera to avoid risk of stroke/thromboembolic events. Role phlebotomy in secondary polycythemia is controversial.  Phlebotomy could be offered for symptom control, however the goal should be to identify the underlying cause/and treat the cause.   #Dizzy spells unclear etiology question related to erythrocytosis.  Currently improved.  # DISPOSITION: # phlebotomy today/labs today; CXR in 1 week.  # Follow up in 2 weeks/MD/lab- H&H/possible Phlebtotomy- Dr.B

## 2018-01-31 NOTE — Progress Notes (Signed)
New patient in for elevated hematocrit.

## 2018-02-01 ENCOUNTER — Other Ambulatory Visit: Payer: Self-pay | Admitting: Family Medicine

## 2018-02-01 LAB — ERYTHROPOIETIN: Erythropoietin: 12.5 m[IU]/mL (ref 2.6–18.5)

## 2018-02-05 ENCOUNTER — Ambulatory Visit
Admission: RE | Admit: 2018-02-05 | Discharge: 2018-02-05 | Disposition: A | Discharge status: Home / Self Care | Payer: PPO | Source: Ambulatory Visit | Attending: Internal Medicine | Admitting: Internal Medicine

## 2018-02-05 DIAGNOSIS — R5383 Other fatigue: Secondary | ICD-10-CM | POA: Diagnosis not present

## 2018-02-05 DIAGNOSIS — D751 Secondary polycythemia: Secondary | ICD-10-CM | POA: Diagnosis not present

## 2018-02-05 LAB — JAK2 GENOTYPR

## 2018-02-14 ENCOUNTER — Other Ambulatory Visit: Payer: Self-pay | Admitting: Family Medicine

## 2018-02-19 ENCOUNTER — Other Ambulatory Visit: Payer: Self-pay

## 2018-02-19 DIAGNOSIS — D751 Secondary polycythemia: Secondary | ICD-10-CM

## 2018-02-21 ENCOUNTER — Ambulatory Visit: Payer: Self-pay | Admitting: *Deleted

## 2018-02-21 MED ORDER — FLUTICASONE PROPIONATE 50 MCG/ACT NA SUSP: 2.0000 | Freq: Every day | NASAL | 5 refills | Status: DC

## 2018-02-21 NOTE — Addendum Note (Signed)
Addended by: Oumou Smead, Janit BernMELINDA P on: 02/21/2018 12:37 PM   Modules accepted: Orders

## 2018-02-21 NOTE — Telephone Encounter (Signed)
Please let patient know that I sent in the nasal spray as he requested Thank you

## 2018-02-21 NOTE — Telephone Encounter (Signed)
Called pt regarding symptoms; he complains of runny nose and sneezing; he says that it has been going on for 2-3 days; the pt also says that his eyes are watering; recommendations made per nurse triage; the pt says that he will go to the pharmacy  to get an antihistamine; the pt also says that he would like for Dr Sherie DonLada to call him in a nasal spray; he can be contacted at [phone]; the pt would like to be contacted with Dr Marlise EvesLada's decision;[facility] [place]; will route to office for final disposition.      Reason for Disposition . [1] Lots of yellow or green discharge from nose AND [2] present > 3 days  Answer Assessment - Initial Assessment Questions 1. SYMPTOM: "What's the main symptom you're concerned about?" (e.g., runny nose, sneezing,     Runny nose, sneezing, watery eyes 2. SEVERITY: "How bad is it?" "What does it keep you from doing?" (e.g., sleeping, working)      Mild to moderate 3. EYES: "Are the eyes also red, watery, and itchy?"      Red and watery 4. TRIGGER: "What pollen or other allergic substance do you think is causing the symptoms?"      I don't know 5. TREATMENT: "What medicine are you using?" "What medicine worked best in the past?"     no 6. OTHER SYMPTOMS: "Do you have any other symptoms?" (e.g., coughing, difficulty breathing, wheezing)     occasional cough 7. PREGNANCY: "Is there any chance you are pregnant?" "When was your last menstrual period?"     n/a  Protocols used: NASAL ALLERGIES (HAY FEVER)-A-AH

## 2018-02-21 NOTE — Telephone Encounter (Signed)
Pt.notified

## 2018-02-24 ENCOUNTER — Other Ambulatory Visit: Payer: Self-pay

## 2018-02-24 ENCOUNTER — Encounter: Payer: Self-pay | Admitting: Internal Medicine

## 2018-02-24 ENCOUNTER — Inpatient Hospital Stay (HOSPITAL_BASED_OUTPATIENT_CLINIC_OR_DEPARTMENT_OTHER): Payer: PPO | Admitting: Internal Medicine

## 2018-02-24 ENCOUNTER — Inpatient Hospital Stay: Payer: PPO

## 2018-02-24 VITALS — BP 163/86 | HR 65 | Temp 97.9°F | Resp 20 | Ht 73.0 in | Wt 246.0 lb

## 2018-02-24 DIAGNOSIS — I10 Essential (primary) hypertension: Secondary | ICD-10-CM | POA: Diagnosis not present

## 2018-02-24 DIAGNOSIS — D751 Secondary polycythemia: Secondary | ICD-10-CM | POA: Diagnosis not present

## 2018-02-24 DIAGNOSIS — R42 Dizziness and giddiness: Secondary | ICD-10-CM | POA: Diagnosis not present

## 2018-02-24 LAB — CBC WITH DIFFERENTIAL/PLATELET
Abs Immature Granulocytes: 0.02 10*3/uL (ref 0.00–0.07)
BASOS PCT: 1 %
Basophils Absolute: 0 10*3/uL (ref 0.0–0.1)
EOS ABS: 0.1 10*3/uL (ref 0.0–0.5)
Eosinophils Relative: 2 %
HEMATOCRIT: 49.9 % (ref 39.0–52.0)
HEMOGLOBIN: 16.7 g/dL (ref 13.0–17.0)
Immature Granulocytes: 0 %
Lymphocytes Relative: 23 %
Lymphs Abs: 1.3 10*3/uL (ref 0.7–4.0)
MCH: 30.4 pg (ref 26.0–34.0)
MCHC: 33.5 g/dL (ref 30.0–36.0)
MCV: 90.9 fL (ref 80.0–100.0)
MONOS PCT: 11 %
Monocytes Absolute: 0.6 10*3/uL (ref 0.1–1.0)
Neutro Abs: 3.7 10*3/uL (ref 1.7–7.7)
Neutrophils Relative %: 63 %
Platelets: 224 10*3/uL (ref 150–400)
RBC: 5.49 MIL/uL (ref 4.22–5.81)
RDW: 12.1 % (ref 11.5–15.5)
WBC: 5.7 10*3/uL (ref 4.0–10.5)
nRBC: 0 % (ref 0.0–0.2)

## 2018-02-24 LAB — COMPREHENSIVE METABOLIC PANEL
ALBUMIN: 4.1 g/dL (ref 3.5–5.0)
ALK PHOS: 48 U/L (ref 38–126)
ALT: 14 U/L (ref 0–44)
ANION GAP: 7 (ref 5–15)
AST: 17 U/L (ref 15–41)
BILIRUBIN TOTAL: 1.6 mg/dL — AB (ref 0.3–1.2)
BUN: 11 mg/dL (ref 8–23)
CALCIUM: 9.1 mg/dL (ref 8.9–10.3)
CO2: 26 mmol/L (ref 22–32)
Chloride: 107 mmol/L (ref 98–111)
Creatinine, Ser: 1.12 mg/dL (ref 0.61–1.24)
GFR calc Af Amer: >60 mL/min (ref >60)
GFR, EST NON AFRICAN AMERICAN: 57 mL/min — AB (ref >60)
Glucose, Bld: 110 mg/dL — ABNORMAL HIGH (ref 70–99)
Potassium: 4 mmol/L (ref 3.5–5.1)
Sodium: 140 mmol/L (ref 135–145)
TOTAL PROTEIN: 7 g/dL (ref 6.5–8.1)

## 2018-02-24 NOTE — Progress Notes (Signed)
[facility] CONSULT NOTE  Patient Care Team: Lada, Janit BernMelinda P, MD as PCP - General (Family Medicine) Garen GramsSimmons, Tony William, MD as Consulting Physician (Cardiology)  CHIEF COMPLAINTS/PURPOSE OF CONSULTATION: Erythrocytosis  # NOV SECONDARY Erythrocytosis [negative Jak 2/normal erythropoietin levels; ER/dizzyness.] s/p Phlebotomy x1; on surveillaince  # Hx of mini-stroke/ chronic intermittent mild elevation of bilirubin-question Gilbert's syndrome.   No history exists.     HISTORY OF PRESENTING ILLNESS:  Jeffery BrittleJoe Campbell 84 y.o.  male is here for follow-up after recent diagnosis of erythrocytosis.  Patient had phlebotomy approximately 2 weeks ago.  He did not notice any significant difference post phlebotomy.  In general patient's dizziness has resolved.  But he feels overall improved.  Review of Systems  Constitutional: Negative for chills, diaphoresis, fever, malaise/fatigue and weight loss.  HENT: Negative for nosebleeds and sore throat.   Eyes: Negative for double vision.  Respiratory: Negative for cough, hemoptysis, sputum production, shortness of breath and wheezing.   Cardiovascular: Negative for chest pain, palpitations, orthopnea and leg swelling.  Gastrointestinal: Negative for abdominal pain, blood in stool, constipation, diarrhea, heartburn, melena, nausea and vomiting.  Genitourinary: Negative for dysuria, frequency and urgency.  Musculoskeletal: Negative for back pain and joint pain.  Skin: Negative.  Negative for itching and rash.  Neurological: Negative for tingling, focal weakness, weakness and headaches.  Endo/Heme/Allergies: Does not bruise/bleed easily.  Psychiatric/Behavioral: Negative for depression. The patient is not nervous/anxious and does not have insomnia.      MEDICAL HISTORY:  Past Medical History:  Diagnosis Date  . Arrhythmia   . Cough    CHRONIC  . Depression   . Disturbance of sleep   . Diverticulitis   . GERD (gastroesophageal  reflux disease)   . Hemorrhoids   . Hypertension   . OA (osteoarthritis)   . Pure hypercholesterolemia   . Stroke [facility])     SURGICAL HISTORY: Past Surgical History:  Procedure Laterality Date  . CATARACT EXTRACTION W/PHACO Left 02/14/2016   Procedure: CATARACT EXTRACTION PHACO AND INTRAOCULAR LENS PLACEMENT (IOC);  Surgeon: Galen ManilaWilliam Porfilio, MD;  Location: [facility] ORS;  Service: Ophthalmology;  Laterality: Left;  US 53.7 AP% 21.5 CDE 11.58 FLUID PACK LOT # [id number] H  . COLONOSCOPY    . KNEE ARTHROSCOPY    . TRANSURETHRAL RESECTION OF PROSTATE      SOCIAL HISTORY: Social History   Socioeconomic History  . Marital status: Divorced    Spouse name: Not on file  . Number of children: 3  . Years of education: Not on file  . Highest education level: 11th grade  Occupational History  . Occupation: Retired  Engineer, productionocial Needs  . Financial resource strain: Not hard at all  . Food insecurity:    Worry: Never true    Inability: Never true  . Transportation needs:    Medical: No    Non-medical: No  Tobacco Use  . Smoking status: Never Smoker  . Smokeless tobacco: Never Used  . Tobacco comment: smoking cessation materials not required  Substance and Sexual Activity  . Alcohol use: No    Alcohol/week: 0.0 standard drinks  . Drug use: No  . Sexual activity: Not Currently  Lifestyle  . Physical activity:    Days per week: 3 days    Minutes per session: 60 min  . Stress: Not at all  Relationships  . Social connections:    Talks on phone: Patient refused    Gets together: Patient refused    Attends religious service: Patient refused  Active member of club or organization: Patient refused    Attends meetings of clubs or organizations: Patient refused    Relationship status: Divorced  . Intimate partner violence:    Fear of current or ex partner: No    Emotionally abused: No    Physically abused: No    Forced sexual activity: No  Other Topics Concern  . Not on file  Social  History Narrative   Lives at home alone.   Writes left-handed - does everything else with right-hand.   No caffeine use.      Never smoker; never alcohol; used to be truck driver; works part time in Systems analyst course. 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     FAMILY HISTORY: Family History  Problem Relation Age of Onset  . Hypertension Son        Posey Rea of parents health.  . Prostate cancer Neg Hx   . Bladder Cancer Neg Hx   . Kidney cancer Neg Hx     ALLERGIES:  has No Known Allergies.  MEDICATIONS:  Current Outpatient Medications  Medication Sig Dispense Refill  . alfuzosin (UROXATRAL) 10 MG 24 hr tablet Take 1 tablet (10 mg total) by mouth daily with breakfast. 90 tablet 3  . atorvastatin (LIPITOR) 20 MG tablet TAKE 1 TABLET BY MOUTH EVERYDAY AT BEDTIME (Patient taking differently: Take 20 mg by mouth daily. ) 90 tablet 3  . clopidogrel (PLAVIX) 75 MG tablet TAKE 1 TABLET (75 MG TOTAL) BY MOUTH DAILY. (STOP AGGRENOX) (Patient taking differently: Take 75 mg by mouth daily. ) 90 tablet 3  . Cyanocobalamin (B-12) 3000 MCG SUBL Place 3,000 mcg under the tongue daily.  30 tablet 11  . diltiazem (CARDIZEM CD) 120 MG 24 hr capsule Take 1 capsule (120 mg total) by mouth daily. 90 capsule 1  . finasteride (PROSCAR) 5 MG tablet Take 1 tablet (5 mg total) by mouth daily. 90 tablet 3  . fluticasone (FLONASE) 50 MCG/ACT nasal spray Place 2 sprays into both nostrils daily. 16 g 5  . meclizine (ANTIVERT) 25 MG tablet TAKE 1/2 TO 1 TABLET (12.5-25 MG TOTAL) BY MOUTH 3 (THREE) TIMES DAILY AS NEEDED FOR DIZZINESS. 30 tablet 0  . Omega 3 1200 MG CAPS Take 1,200 mg by mouth daily.     . ranitidine (ZANTAC) 150 MG tablet TAKE 1 TABLET (150 MG TOTAL) BY MOUTH AT BEDTIME. TO PROTECT STOMACH -- STOP LANSOPRAZOLE (Patient taking differently: Take 150 mg by mouth at bedtime. ) 90 tablet 1   No current facility-administered medications for this visit.       [name]  PHYSICAL EXAMINATION: ECOG PERFORMANCE STATUS: 1 -  Symptomatic but completely ambulatory  Vitals:   02/24/18 1311  BP: (!) 163/86  Pulse: 65  Resp: 20  Temp: 97.9 F (36.6 C)   Filed Weights   02/24/18 1311  Weight: 246 lb (111.6 kg)    Physical Exam  Constitutional: He is oriented to person, place, and time and well-developed, well-nourished, and in no distress.  HENT:  Head: Normocephalic and atraumatic.  Mouth/Throat: Oropharynx is clear and moist. No oropharyngeal exudate.  Eyes: Pupils are equal, round, and reactive to light.  Neck: Normal range of motion. Neck supple.  Cardiovascular: Normal rate and regular rhythm.  Pulmonary/Chest: No respiratory distress. He has no wheezes.  Abdominal: Soft. Bowel sounds are normal. He exhibits no distension and no mass. There is no tenderness. There is no rebound and no guarding.  Musculoskeletal: Normal range of motion. He exhibits no edema  or tenderness.  Neurological: He is alert and oriented to person, place, and time.  Skin: Skin is warm.  Psychiatric: Affect normal.     LABORATORY DATA:  I have reviewed the data as listed Lab Results  Component Value Date   WBC 5.7 02/24/2018   HGB 16.7 02/24/2018   HCT 49.9 02/24/2018   MCV 90.9 02/24/2018   PLT 224 02/24/2018   Recent Labs    04/08/17 0829  01/27/18 0802 01/31/18 1444 02/24/18 1144  NA 140   < > 142 139 140  K 4.1   < > 3.6 3.7 4.0  CL 107   < > 108 109 107  CO2 26   < > 26 25 26   GLUCOSE 92   < > 109* 107* 110*  BUN 12   < > 13 15 11   CREATININE 1.17*   < > 1.24 1.04 1.12  CALCIUM 9.2   < > 9.3 9.1 9.1  GFRNONAA 56*   < > 51* >60 57*  GFRAA 65   < > 59* >60 >60  PROT 6.8   < > 7.9 6.8 7.0  ALBUMIN  --   --  4.4 4.0 4.1  AST 11   < > 20 19 17   ALT 11   < > 15 14 14   ALKPHOS  --   --  49 46 48  BILITOT 1.5*   < > 2.3* 1.9* 1.6*  BILIDIR 0.4*  --   --   --   --    < > = values in this interval not displayed.    RADIOGRAPHIC STUDIES: I have personally reviewed the radiological images as listed and  agreed with the findings in the report. Dg Chest 2 View  Result Date: 02/05/2018 CLINICAL DATA:  Fatigue EXAM: CHEST - 2 VIEW COMPARISON:  08/13/2016 FINDINGS: There is mild bilateral interstitial thickening likely chronic. There is no significant interval change. There is no focal consolidation. There is no pleural effusion or pneumothorax. The heart and mediastinal contours are unremarkable. The osseous structures are unremarkable. IMPRESSION: No active cardiopulmonary disease. Electronically Signed   By: Elige Ko   On: 02/05/2018 10:18    ASSESSMENT & PLAN:   Erythrocytosis # Erythrocytosis isolated-unclear etiology/however secondary-negative Jak 2/normal erythropoietin levels.  #Today hemoglobin 16 hematocrit 49; hold off phlebotomy.  #Blood pressure poorly controlled recommend checking blood pressure closely at home.  Continue compliance with medications.  If elevated follow-up with PCP.  #Dizzy-resolved.   # DISPOSITION: # no phlebotomy today # Follow up in 4 months/MD/lab- H&H/possible Phlebtotomy- Dr.B  All questions were answered. The patient knows to call the clinic with any problems, questions or concerns.   Earna Coder, MD 02/26/2018 9:03 AM

## 2018-02-24 NOTE — Assessment & Plan Note (Addendum)
#   Erythrocytosis isolated-unclear etiology/however secondary-negative Jak 2/normal erythropoietin levels.  #Today hemoglobin 16 hematocrit 49; hold off phlebotomy.  #Blood pressure poorly controlled recommend checking blood pressure closely at home.  Continue compliance with medications.  If elevated follow-up with PCP.  #Dizzy-resolved.   # DISPOSITION: # no phlebotomy today # Follow up in 4 months/MD/lab- H&H/possible Phlebtotomy- Dr.B

## 2018-02-26 ENCOUNTER — Encounter: Payer: Self-pay | Admitting: Urology

## 2018-02-26 ENCOUNTER — Ambulatory Visit (INDEPENDENT_AMBULATORY_CARE_PROVIDER_SITE_OTHER): Payer: PPO | Admitting: Urology

## 2018-02-26 VITALS — BP 146/76 | HR 75 | Ht 73.0 in | Wt 247.1 lb

## 2018-02-26 DIAGNOSIS — D751 Secondary polycythemia: Secondary | ICD-10-CM | POA: Diagnosis not present

## 2018-02-26 DIAGNOSIS — N401 Enlarged prostate with lower urinary tract symptoms: Secondary | ICD-10-CM

## 2018-02-26 DIAGNOSIS — G4733 Obstructive sleep apnea (adult) (pediatric): Secondary | ICD-10-CM | POA: Diagnosis not present

## 2018-02-26 DIAGNOSIS — G4736 Sleep related hypoventilation in conditions classified elsewhere: Secondary | ICD-10-CM | POA: Diagnosis not present

## 2018-02-26 DIAGNOSIS — N138 Other obstructive and reflux uropathy: Secondary | ICD-10-CM

## 2018-02-26 LAB — BLADDER SCAN AMB NON-IMAGING

## 2018-02-26 NOTE — Progress Notes (Signed)
02/26/2018  10:38 AM   Jeffery Campbell [birth year] [id number]  Referring provider: Kerman Passey, MD [address] [place], [place] [address]  Chief Complaint  Patient presents with   Follow-up    HPI: Patient is an 84 year old Caucasian male who returns today for the evaluation and management of BPH with LUTS. His last visit was [place] was on 01/23/2018.   The pt c/o of nocturia,  and leakage of urine. Pt reports improvement of symptoms after alfuzosin 10 mg and finsateride 5 mg . He repots of nocturia 1x and improvement in leakage of urine. Patient denies any gross hematuria, dysuria or suprapubic/flank pain. Patient denies any fevers, chills, nausea or vomiting. He has been feeling light-headed ever since he had a stroke.   Background History:  He was taking 2 mg of Hytrin nightly.  His UA is negative.       He was complaining of nocturia.  He states he is dribbling urine for a short time on an intermittent basis.    His IPSS is 3/1, today.   His PVR is 0 mL, previous was 17 mL.   PMH: Past Medical History:  Diagnosis Date   Arrhythmia    Cough    CHRONIC   Depression    Disturbance of sleep    Diverticulitis    GERD (gastroesophageal reflux disease)    Hemorrhoids    Hypertension    OA (osteoarthritis)    Pure hypercholesterolemia    Stroke [facility])     Surgical History: Past Surgical History:  Procedure Laterality Date   CATARACT EXTRACTION W/PHACO Left 02/14/2016   Procedure: CATARACT EXTRACTION PHACO AND INTRAOCULAR LENS PLACEMENT (IOC);  Surgeon: Galen Manila, MD;  Location: [facility] ORS;  Service: Ophthalmology;  Laterality: Left;  [place] 53.7 AP% 21.5 CDE 11.58 FLUID PACK LOT # [id number] H   COLONOSCOPY     KNEE ARTHROSCOPY     TRANSURETHRAL RESECTION OF PROSTATE      Home Medications:  Allergies as of 02/26/2018   No Known Allergies     Medication List        Accurate as of 02/26/18 10:38 AM. Always use your most recent med  list.          alfuzosin 10 MG 24 hr tablet Commonly known as:  UROXATRAL Take 1 tablet (10 mg total) by mouth daily with breakfast.   atorvastatin 20 MG tablet Commonly known as:  LIPITOR TAKE 1 TABLET BY MOUTH EVERYDAY AT BEDTIME   B-12 3000 MCG Subl Place 3,000 mcg under the tongue daily.   clopidogrel 75 MG tablet Commonly known as:  PLAVIX TAKE 1 TABLET (75 MG TOTAL) BY MOUTH DAILY. (STOP AGGRENOX)   diltiazem 120 MG 24 hr capsule Commonly known as:  CARDIZEM CD Take 1 capsule (120 mg total) by mouth daily.   finasteride 5 MG tablet Commonly known as:  PROSCAR Take 1 tablet (5 mg total) by mouth daily.   fluticasone 50 MCG/ACT nasal spray Commonly known as:  FLONASE Place 2 sprays into both nostrils daily.   meclizine 25 MG tablet Commonly known as:  ANTIVERT TAKE 1/2 TO 1 TABLET (12.5-25 MG TOTAL) BY MOUTH 3 (THREE) TIMES DAILY AS NEEDED FOR DIZZINESS.   Omega 3 1200 MG Caps Take 1,200 mg by mouth daily.   ranitidine 150 MG tablet Commonly known as:  ZANTAC TAKE 1 TABLET (150 MG TOTAL) BY MOUTH AT BEDTIME. TO PROTECT STOMACH -- STOP LANSOPRAZOLE       Allergies:  No Known Allergies  Family History: Family History  Problem Relation Age of Onset   Hypertension Son        Posey ReaUnsure of parents health.   Prostate cancer Neg Hx    Bladder Cancer Neg Hx    Kidney cancer Neg Hx     Social History:  reports that he has never smoked. He has never used smokeless tobacco. He reports that he does not drink alcohol or use drugs.  ROS: UROLOGY Frequent Urination?: No Hard to postpone urination?: No Burning/pain with urination?: No Get up at night to urinate?: Yes Leakage of urine?: Yes Urine stream starts and stops?: No Trouble starting stream?: No Do you have to strain to urinate?: No Blood in urine?: No Urinary tract infection?: No Sexually transmitted disease?: No Injury to kidneys or bladder?: No Painful intercourse?: No Weak stream?: No Erection  problems?: No Penile pain?: No  Gastrointestinal Nausea?: No Vomiting?: No Indigestion/heartburn?: No Diarrhea?: No Constipation?: No  Constitutional Fever: No Night sweats?: No Weight loss?: No Fatigue?: No  Skin Skin rash/lesions?: No Itching?: No  Eyes Blurred vision?: No Double vision?: No  Ears/Nose/Throat Sore throat?: No Sinus problems?: No  Hematologic/Lymphatic Swollen glands?: No Easy bruising?: No  Cardiovascular Leg swelling?: No Chest pain?: No  Respiratory Cough?: Yes Shortness of breath?: No  Endocrine Excessive thirst?: No  Musculoskeletal Back pain?: No Joint pain?: No  Neurological Headaches?: No Dizziness?: Yes  Psychologic Depression?: No Anxiety?: No  Physical Exam: BP (!) 146/76 (BP Location: Left Arm, Patient Position: Sitting)    Pulse 75    Ht 6\' 1"  (1.854 m)    Wt 247 lb 1.6 oz (112.1 kg)    BMI 32.60 kg/m   Constitutional: Well nourished. Alert and oriented, No acute distress. HEENT: [place] AT, moist mucus membranes. Trachea midline, no masses. Cardiovascular: No clubbing, cyanosis, or edema. Respiratory: Normal respiratory effort, no increased work of breathing. Skin: No rashes, bruises or suspicious lesions. Lymph: No cervical or inguinal adenopathy. Neurologic: Grossly intact, no focal deficits, moving all 4 extremities. Psychiatric: Normal mood and affect.    Laboratory Data: Lab Results  Component Value Date   WBC 5.7 02/24/2018   HGB 16.7 02/24/2018   HCT 49.9 02/24/2018   MCV 90.9 02/24/2018   PLT 224 02/24/2018    Lab Results  Component Value Date   CREATININE 1.12 02/24/2018    No results found for: PSA  No results found for: TESTOSTERONE  Lab Results  Component Value Date   HGBA1C 4.9 10/07/2017    Lab Results  Component Value Date   TSH 2.090 12/29/2014       Component Value Date/Time   CHOL 122 10/07/2017 0838   CHOL 134 09/22/2014 0810   HDL 51 10/07/2017 0838   HDL 46  09/22/2014 0810   CHOLHDL 2.4 10/07/2017 0838   VLDL 9 10/12/2016 0813   LDLCALC 56 10/07/2017 0838    Lab Results  Component Value Date   AST 17 02/24/2018   Lab Results  Component Value Date   ALT 14 02/24/2018   No components found for: ALKALINEPHOPHATASE No components found for: BILIRUBINTOTAL  No results found for: ESTRADIOL  I have reviewed the labs.   Pertinent Imaging: Results for orders placed or performed in visit on 02/26/18  Bladder Scan (Post Void Residual) in office  Result Value Ref Range   Scan Result 0 ml      Assessment & Plan:    1. BPH with LUTS Continue conservative management,  avoiding bladder irritants and timed voiding's Most bothersome symptoms is/are dribbling Continue alpha-blocker (alfuzosin 10 mg), discussed side effects, symptoms improved Continue 5 alpha reductase inhibitor (finsateride 5 mg ), discussed side effects, symptoms improved  Follow-up in 6 months for I PSS, PVR, and exam Return in about 6 months (around 08/27/2018) for IPSS/PVR/exam.  These notes generated with voice recognition software. I apologize for typographical errors.  Michiel Cowboy, PA-C  [facility] [address]  [address] [place], [place] [address] [phone]  I, Donne Hazel, am acting as a Neurosurgeon for [employer],  I have reviewed the above documentation for accuracy and completeness, and I agree with the above.    Michiel Cowboy, PA-C

## 2018-03-07 ENCOUNTER — Ambulatory Visit (INDEPENDENT_AMBULATORY_CARE_PROVIDER_SITE_OTHER): Payer: PPO | Admitting: Family Medicine

## 2018-03-07 ENCOUNTER — Encounter: Payer: Self-pay | Admitting: Family Medicine

## 2018-03-07 DIAGNOSIS — Z8673 Personal history of transient ischemic attack (TIA), and cerebral infarction without residual deficits: Secondary | ICD-10-CM

## 2018-03-07 DIAGNOSIS — R7303 Prediabetes: Secondary | ICD-10-CM

## 2018-03-07 DIAGNOSIS — R2681 Unsteadiness on feet: Secondary | ICD-10-CM | POA: Diagnosis not present

## 2018-03-07 DIAGNOSIS — N183 Chronic kidney disease, stage 3 unspecified: Secondary | ICD-10-CM

## 2018-03-07 DIAGNOSIS — R269 Unspecified abnormalities of gait and mobility: Secondary | ICD-10-CM

## 2018-03-07 DIAGNOSIS — R42 Dizziness and giddiness: Secondary | ICD-10-CM | POA: Diagnosis not present

## 2018-03-07 NOTE — Assessment & Plan Note (Signed)
Refer to physical therapy 

## 2018-03-07 NOTE — Patient Instructions (Signed)
We'll have you see Dr[facility] Malvin [facility]Potter [facility] with [facility] physical therapist about your balance and walking

## 2018-03-07 NOTE — Assessment & Plan Note (Signed)
Refer to PT and neuro

## 2018-03-07 NOTE — Progress Notes (Signed)
BP 122/78   Pulse 72   Temp 98.2 F (36.8 C) (Oral)   Ht 6\' 1"  (1.854 m)   Wt 244 lb 11.2 oz (111 kg)   SpO2 94%   BMI 32.28 kg/m    Subjective:    Patient ID: Jeffery Campbell, male    DOB: [birth year], 84 y.o.   MRN: [id number]  HPI: Jeffery BrittleJoe Tholl is a 84 y.o. male  Chief Complaint  Patient presents with  . Follow-up    HPI Patient is here for follow-up; I received notification that patient had several diagnoses and there were care gaps associated with those, needing documentation and coverage for his various health issues; we reviewed these in detail today  First issue was that there was concern that he has diabetes He says he does not have diabetes; 2 or 3 different doctors have told him that He has prediabetes; last four glucose readings were 120, 86, 109, 107 No blurred vision, no dry mouth  Lab Results  Component Value Date   HGBA1C 4.9 10/07/2017   Major depressive disorder, bipolar d/o and paranoid d/o were listed in the chart He does not have major depression; that was listed back in 2016; he told me in October that he was not depressed and the medicine was weaned off; he denies bipolar symptoms completely when reviewed; he denies paranoia "I ain't never had that"  Heart arrhythmias listed in insurance report; last EKG showed normal sinus rhythm, EKG in October NSR; EKG in 2018 showed NSR; he does not feel any skipped beats or palpitations; no chest pain  Hemiplegia, hemiparesis; he has had a stroke, "a mini-stroke" 4 or 5 years ago; then someone at the hospital said he hadn't had a stroke; MRI Dec 2018, "old infarction right lateral thalamus"; he is right-handed; he denies weakness on the left side; since the stroke, he does not walk as good as he used to; balance is not as good; he is not sure if from stroke; no weakness in the left leg; no weakness in the left arm  He has never had any trouble with his circulation in his feet; he has never heard anyone tell him that he  has peripheral vascular disease  CKD stage 3; address in July with patient; no NSAIDs  He has dizziness at times; gets up in the morning and sits on the edge of the bed and it goes away after five seconds  He has not been back to the golf [place] and would like to stay out of work until finished with physical therapy; he'll just let us know if letter needed  [facility] 03/07/2018 01/10/2018 10/24/2017 10/07/2017 10/07/2017  Decreased Interest 0 0 0 0 0  Down, Depressed, Hopeless 0 0 0 0 0  PHQ - 2 Score 0 0 0 0 0  Altered sleeping 0 0 0 0 -  Tired, decreased energy 0 0 0 0 -  Change in appetite 0 0 0 0 -  Feeling bad or failure about yourself  0 0 0 0 -  Trouble concentrating 0 0 0 0 -  Moving slowly or fidgety/restless 0 0 0 0 -  Suicidal thoughts 0 0 0 0 -  PHQ-9 Score 0 0 0 0 -  Difficult doing work/chores Not difficult at all Not difficult at all Not difficult at all Not difficult at all -   Fall Risk  03/07/2018 01/10/2018 10/24/2017 10/07/2017 04/08/2017  Falls in the past year? 0 No No No No  Number falls in past yr: - - - - -  Injury with Fall? - - - - -  Risk for fall due to : - - Impaired vision - -  Risk for fall due to: Comment - - wears eyeglasses - -    Relevant past medical, surgical, family and social history reviewed Past Medical History:  Diagnosis Date  . Arrhythmia   . Cough    CHRONIC  . Depression   . Disturbance of sleep   . Diverticulitis   . GERD (gastroesophageal reflux disease)   . Hemorrhoids   . Hypertension   . OA (osteoarthritis)   . Pure hypercholesterolemia   . Stroke [facility])    Past Surgical History:  Procedure Laterality Date  . CATARACT EXTRACTION W/PHACO Left 02/14/2016   Procedure: CATARACT EXTRACTION PHACO AND INTRAOCULAR LENS PLACEMENT (IOC);  Surgeon: Galen Manila, MD;  Location: [facility] ORS;  Service: Ophthalmology;  Laterality: Left;  [place] 53.7 AP% 21.5 CDE 11.58 FLUID PACK LOT # [id number]  . COLONOSCOPY    . KNEE  ARTHROSCOPY    . TRANSURETHRAL RESECTION OF PROSTATE     Family History  Problem Relation Age of Onset  . Hypertension Son        Posey Rea of parents health.  . Prostate cancer Neg Hx   . Bladder Cancer Neg Hx   . Kidney cancer Neg Hx    Social History   Tobacco Use  . Smoking status: Never Smoker  . Smokeless tobacco: Never Used  . Tobacco comment: smoking cessation materials not required  Substance Use Topics  . Alcohol use: No    Alcohol/week: 0.0 standard drinks  . Drug use: No     [facility] Visit from 03/07/2018 in [facility]  AUDIT-C Score  0      Interim medical history since last visit reviewed. Allergies and medications reviewed  Review of Systems Per HPI unless specifically indicated above     Objective:    BP 122/78   Pulse 72   Temp 98.2 F (36.8 C) (Oral)   Ht 6\' 1"  (1.854 m)   Wt 244 lb 11.2 oz (111 kg)   SpO2 94%   BMI 32.28 kg/m   Wt Readings from Last 3 Encounters:  03/07/18 244 lb 11.2 oz (111 kg)  02/26/18 247 lb 1.6 oz (112.1 kg)  02/24/18 246 lb (111.6 kg)    Physical Exam  Constitutional: He appears well-developed and well-nourished. No distress.  HENT:  Head: Normocephalic and atraumatic.  Eyes: EOM are normal. No scleral icterus.  Neck: No thyromegaly present.  Cardiovascular: Normal rate and regular rhythm.  Pulmonary/Chest: Effort normal and breath sounds normal.  Abdominal: Soft. Bowel sounds are normal. He exhibits no distension.  Musculoskeletal: He exhibits no edema.  Neurological: He is alert. He displays normal reflexes. He exhibits normal muscle tone.  Reflex Scores:      Patellar reflexes are 2+ on the right side and 2+ on the left side. Gait is a little unsteady; no left-sided weakness at all; UE and LE strength 5/5 bilaterally  Skin: Skin is warm and dry. No pallor.  Psychiatric: He has a normal mood and affect. His behavior is normal. Judgment and thought content normal.    Results for orders  placed or performed in visit on 02/26/18  Bladder Scan (Post Void Residual) in office  Result Value Ref Range   Scan Result 0 ml       Assessment & Plan:   Problem List  Items Addressed This Visit      Genitourinary   CKD (chronic kidney disease) stage 3, GFR 30-59 ml/min (HCC)    Avoid NSAIDs, hydrate really well        Other   Prediabetes    Patient does not have type 2 diabetes mellitus; he has prediabetes      Hx of completed stroke    MRI showed old stroke in the right lateral thalamus      General unsteadiness    Refer to physical therapy      Relevant Orders   Ambulatory referral to Physical Therapy   Ambulatory referral to Neurology   Dizziness    Only take the dizzy pill when dizzy, not regularly      Relevant Orders   Ambulatory referral to Physical Therapy   Ambulatory referral to Neurology   Abnormal gait    Refer to PT and neuro         Follow up plan: No follow-ups on file.  An after-visit summary was printed and given to the patient at check-out.  Please see the patient instructions which may contain other information and recommendations beyond what is mentioned above in the assessment and plan.  No orders of the defined types were placed in this encounter.   Orders Placed This Encounter  Procedures  . Ambulatory referral to Physical Therapy  . Ambulatory referral to Neurology

## 2018-03-07 NOTE — Assessment & Plan Note (Signed)
MRI showed old stroke in the right lateral thalamus

## 2018-03-07 NOTE — Assessment & Plan Note (Signed)
Avoid NSAIDs, hydrate really well

## 2018-03-07 NOTE — Assessment & Plan Note (Signed)
Patient does not have type 2 diabetes mellitus; he has prediabetes

## 2018-03-07 NOTE — Assessment & Plan Note (Signed)
Only take the dizzy pill when dizzy, not regularly

## 2018-03-13 ENCOUNTER — Other Ambulatory Visit: Payer: Self-pay | Admitting: Family Medicine

## 2018-03-14 ENCOUNTER — Telehealth: Payer: Self-pay | Admitting: Family Medicine

## 2018-03-14 NOTE — Telephone Encounter (Signed)
That's fine; I put in a referral on Dec 6th so please get him an appointment locally; that shouldn't need another referral from me; thank you

## 2018-03-14 NOTE — Telephone Encounter (Signed)
Copied from CRM [id number]. Topic: Referral - Question >> Mar 14, 2018  9:35 AM Lynne LoganHudson, Caryn D wrote: Reason for CRM: pt called and asked if he could be referred to a PT office in [place]. Stated due to his age it would help a great deal to not have to drive back and forth to [place]. Please advise.

## 2018-03-18 ENCOUNTER — Other Ambulatory Visit: Payer: Self-pay | Admitting: Family Medicine

## 2018-03-18 DIAGNOSIS — E782 Mixed hyperlipidemia: Secondary | ICD-10-CM

## 2018-03-24 ENCOUNTER — Ambulatory Visit: Payer: Self-pay

## 2018-03-24 NOTE — Telephone Encounter (Signed)
Returned call to patient who had called for help with OTC medications for constipation.  Pt states he had some greens last night and got up today and had a good soft BM. He also treated with something he had at home for constipation.  He denies abdominal pain and rectal pain He denies bleeding. He states he does not have hemorrhoids.  Per protocol home care advice given to patient. Pt will call back if his symptoms return. Pt was encouraged to continue diet high in fiber. Fruits and vegetables encouraged. Reason for Disposition . Treating constipation with Over-The-Counter (OTC) medicines, questions about  Answer Assessment - Initial Assessment Questions 1. STOOL PATTERN OR FREQUENCY: "How often do you pass bowel movements (BMs)?"  (Normal range: tid to q 3 days)  "When was the last BM passed?"       today 2. STRAINING: "Do you have to strain to have a BM?"      no 3. RECTAL PAIN: "Does your rectum hurt when the stool comes out?" If so, ask: "Do you have hemorrhoids? How bad is the pain?"  (Scale 1-10; or mild, moderate, severe)     No pain 4. STOOL COMPOSITION: "Are the stools hard?"      Softer today 5. BLOOD ON STOOLS: "Has there been any blood on the toilet tissue or on the surface of the BM?" If so, ask: "When was the last time?"      no 6. CHRONIC CONSTIPATION: "Is this a new problem for you?"  If no, ask: "How long have you had this problem?" (days, weeks, months)      yes 7. CHANGES IN DIET: "Have there been any recent changes in your diet?"      Ate greens last moved him today 8. MEDICATIONS: "Have you been taking any new medications?"     softner 9. LAXATIVES: "Have you been using any laxatives or enemas?"  If yes, ask "What, how often, and when was the last time?"     no 10. CAUSE: "What do you think is causing the constipation?"        unsure 11. OTHER SYMPTOMS: "Do you have any other symptoms?" (e.g., abdominal pain, fever, vomiting)      none 12. PREGNANCY: "Is there any  chance you are pregnant?" "When was your last menstrual period?"       N/A  Protocols used: CONSTIPATION-A-AH

## 2018-03-28 DIAGNOSIS — G4736 Sleep related hypoventilation in conditions classified elsewhere: Secondary | ICD-10-CM | POA: Diagnosis not present

## 2018-03-28 DIAGNOSIS — G4733 Obstructive sleep apnea (adult) (pediatric): Secondary | ICD-10-CM | POA: Diagnosis not present

## 2018-03-28 DIAGNOSIS — D751 Secondary polycythemia: Secondary | ICD-10-CM | POA: Diagnosis not present

## 2018-04-02 DIAGNOSIS — D751 Secondary polycythemia: Secondary | ICD-10-CM | POA: Diagnosis not present

## 2018-04-02 DIAGNOSIS — G4733 Obstructive sleep apnea (adult) (pediatric): Secondary | ICD-10-CM | POA: Diagnosis not present

## 2018-04-02 DIAGNOSIS — G4736 Sleep related hypoventilation in conditions classified elsewhere: Secondary | ICD-10-CM | POA: Diagnosis not present

## 2018-04-09 ENCOUNTER — Encounter: Payer: Self-pay | Admitting: Family Medicine

## 2018-04-09 ENCOUNTER — Ambulatory Visit (INDEPENDENT_AMBULATORY_CARE_PROVIDER_SITE_OTHER): Payer: PPO | Admitting: Family Medicine

## 2018-04-09 DIAGNOSIS — R7303 Prediabetes: Secondary | ICD-10-CM

## 2018-04-09 DIAGNOSIS — J301 Allergic rhinitis due to pollen: Secondary | ICD-10-CM | POA: Diagnosis not present

## 2018-04-09 DIAGNOSIS — E6609 Other obesity due to excess calories: Secondary | ICD-10-CM

## 2018-04-09 DIAGNOSIS — R42 Dizziness and giddiness: Secondary | ICD-10-CM | POA: Diagnosis not present

## 2018-04-09 DIAGNOSIS — R718 Other abnormality of red blood cells: Secondary | ICD-10-CM

## 2018-04-09 DIAGNOSIS — R2681 Unsteadiness on feet: Secondary | ICD-10-CM

## 2018-04-09 DIAGNOSIS — Z6832 Body mass index (BMI) 32.0-32.9, adult: Secondary | ICD-10-CM

## 2018-04-09 DIAGNOSIS — E782 Mixed hyperlipidemia: Secondary | ICD-10-CM

## 2018-04-09 DIAGNOSIS — K219 Gastro-esophageal reflux disease without esophagitis: Secondary | ICD-10-CM

## 2018-04-09 DIAGNOSIS — I10 Essential (primary) hypertension: Secondary | ICD-10-CM | POA: Diagnosis not present

## 2018-04-09 NOTE — Assessment & Plan Note (Signed)
Seeing Dr. Malvin Johns soon

## 2018-04-09 NOTE — Patient Instructions (Addendum)
Please call physical therapy to schedule an appointment, [phone]  Check out the information at familydoctor.org entitled "Nutrition for Weight Loss: What You Need to Know about Fad Diets" Try to lose between 1-2 pounds per week by taking in fewer calories and burning off more calories You can succeed by limiting portions, limiting foods dense in calories and fat, becoming more active, and drinking 8 glasses of water a day (64 ounces) Don't skip meals, especially breakfast, as skipping meals may alter your metabolism Do not use over-the-counter weight loss pills or gimmicks that claim rapid weight loss A healthy BMI (or body mass index) is between 18.5 and 24.9 You can calculate your ideal BMI at the NIH website JobEconomics.hu   Preventing Unhealthy Weight Gain, Adult Staying at a healthy weight is important to your overall health. When fat builds up in your body, you may become overweight or obese. Being overweight or obese increases your risk of developing certain health problems, such as heart disease, diabetes, sleeping problems, joint problems, and some types of cancer. Unhealthy weight gain is often the result of making unhealthy food choices or not getting enough exercise. You can make changes to your lifestyle to prevent obesity and stay as healthy as possible. What nutrition changes can be made?   Eat only as much as your body needs. To do this: ? Pay attention to signs that you are hungry or full. Stop eating as soon as you feel full. ? If you feel hungry, try drinking water first before eating. Drink enough water so your urine is clear or pale yellow. ? Eat smaller portions. Pay attention to portion sizes when eating out. ? Look at serving sizes on food labels. Most foods contain more than one serving per container. ? Eat the recommended number of calories for your gender and activity level. For most active people, a daily  total of 2,000 calories is appropriate. If you are trying to lose weight or are not very active, you may need to eat fewer calories. Talk with your health care provider or a diet and nutrition specialist (dietitian) about how many calories you need each day.  Choose healthy foods, such as: ? Fruits and vegetables. At each meal, try to fill at least half of your plate with fruits and vegetables. ? Whole grains, such as whole-wheat bread, brown rice, and quinoa. ? Lean meats, such as chicken or fish. ? Other healthy proteins, such as beans, eggs, or tofu. ? Healthy fats, such as nuts, seeds, fatty fish, and olive oil. ? Low-fat or fat-free dairy products.  Check food labels, and avoid food and drinks that: ? Are high in calories. ? Have added sugar. ? Are high in sodium. ? Have saturated fats or trans fats.  Cook foods in healthier ways, such as by baking, broiling, or grilling.  Make a meal plan for the week, and shop with a grocery list to help you stay on track with your purchases. Try to avoid going to the grocery store when you are hungry.  When grocery shopping, try to shop around the outside of the store first, where the fresh foods are. Doing this helps you to avoid prepackaged foods, which can be high in sugar, salt (sodium), and fat. What lifestyle changes can be made?   Exercise for 30 or more minutes on 5 or more days each week. Exercising may include brisk walking, yard work, biking, running, swimming, and team sports like basketball and soccer. Ask your health care provider which  exercises are safe for you.  Do muscle-strengthening activities, such as lifting weights or using resistance bands, on 2 or more days a week.  Do not use any products that contain nicotine or tobacco, such as cigarettes and e-cigarettes. If you need help quitting, ask your health care provider.  Limit alcohol intake to no more than 1 drink a day for nonpregnant women and 2 drinks a day for men. One  drink equals 12 oz of beer, 5 oz of wine, or 1 oz of hard liquor.  Try to get 7-9 hours of sleep each night. What other changes can be made?  Keep a food and activity journal to keep track of: ? What you ate and how many calories you had. Remember to count the calories in sauces, dressings, and side dishes. ? Whether you were active, and what exercises you did. ? Your calorie, weight, and activity goals.  Check your weight regularly. Track any changes. If you notice you have gained weight, make changes to your diet or activity routine.  Avoid taking weight-loss medicines or supplements. Talk to your health care provider before starting any new medicine or supplement.  Talk to your health care provider before trying any new diet or exercise plan. Why are these changes important? Eating healthy, staying active, and having healthy habits can help you to prevent obesity. Those changes also:  Help you manage stress and emotions.  Help you connect with friends and family.  Improve your self-esteem.  Improve your sleep.  Prevent long-term health problems. What can happen if changes are not made? Being obese or overweight can cause you to develop joint or bone problems, which can make it hard for you to stay active or do activities you enjoy. Being obese or overweight also puts stress on your heart and lungs and can lead to health problems like diabetes, heart disease, and some cancers. Where to find more information Talk with your health care provider or a dietitian about healthy eating and healthy lifestyle choices. You may also find information from:  U.S. Department of Agriculture, MyPlate: https://ball-collins.biz/www.choosemyplate.gov  American Heart Association: www.heart.org  Centers for Disease Control and Prevention: FootballExhibition.com.brwww.cdc.gov Summary  Staying at a healthy weight is important to your overall health. It helps you to prevent certain diseases and health problems, such as heart disease, diabetes,  joint problems, sleep disorders, and some types of cancer.  Being obese or overweight can cause you to develop joint or bone problems, which can make it hard for you to stay active or do activities you enjoy.  You can prevent unhealthy weight gain by eating a healthy diet, exercising regularly, not smoking, limiting alcohol, and getting enough sleep.  Talk with your health care provider or a dietitian for guidance about healthy eating and healthy lifestyle choices. This information is not intended to replace advice given to you by your health care provider. Make sure you discuss any questions you have with your health care provider. Document Released: 03/20/2016 Document Revised: 12/28/2016 Document Reviewed: 04/25/2016 Elsevier Interactive Patient Education  2019 [employer]

## 2018-04-09 NOTE — Assessment & Plan Note (Signed)
Check cholesterol, limit saturated fats

## 2018-04-09 NOTE — Assessment & Plan Note (Signed)
Encouraged weight loss; check A1c today

## 2018-04-09 NOTE — Assessment & Plan Note (Signed)
Try to lose 10 pounds over the next 2-3 months, gradually, limit portions, don't snack late at night

## 2018-04-09 NOTE — Assessment & Plan Note (Signed)
Doing well, no red flags

## 2018-04-09 NOTE — Assessment & Plan Note (Signed)
Going to work with PT soon, seeing neuro soon

## 2018-04-09 NOTE — Progress Notes (Signed)
BP 124/68 (BP Location: Right Arm, Patient Position: Sitting, Cuff Size: Large)   Pulse 75   Temp 97.9 F (36.6 C) (Oral)   Resp 16   Ht 6\' 1"  (1.854 m)   Wt 247 lb 1.6 oz (112.1 kg)   SpO2 94%   BMI 32.60 kg/m    Subjective:    Patient ID: Jeffery Campbell, male    DOB: 1928/05/02, 85 y.o.   MRN: [id number]  HPI: Jeffery Campbell is a 85 y.o. male  Chief Complaint  Patient presents with  . Follow-up    patient is here for 6 months    HPI Here for f/u  Going to see PT but needs appt in [facility] for unsteadiness ; he has had dizzy spells on and off for years; okay as long as he is up walking around; does not matter if he is tired or not eating; he has an appt next Wednesday with neurologist, Dr. Malvin Johns at [place]  HTN; going on for many years; runs in the family; controlled today; does not add salt to his food  Hx of asymptomatic PVCs; he is not feeling any of these  GERD; it is doing "alright"; he does not see any difference with food; no blood in the stool; no abdominal pain  Dry cough at times; not very often; thinks it is what he is eating; not productive; no unexplained weight loss; no night sweats; he has allergies, touch of nose running at times; ; sometimes getting postnasal drip and drainage down the back of the throat; all year round  High cholesterol; does not eat bacon or cheese for the most part; no eggs  Lab Results  Component Value Date   CHOL 122 10/07/2017   HDL 51 10/07/2017   LDLCALC 56 10/07/2017   TRIG 66 10/07/2017   CHOLHDL 2.4 10/07/2017   Prediabetes; some dry mouth; no diabetes in the family  Obesity; he thinks he is fine where he is; [facility]; he drinks quite a bit of water  Seeing hematologist for high Hgb; last CBC was normal  Depression screen [facility] 2/9 04/09/2018 03/07/2018 01/10/2018 10/24/2017 10/07/2017  Decreased Interest 0 0 0 0 0  Down, Depressed, Hopeless 0 0 0 0 0  PHQ - 2 Score 0 0 0 0 0  Altered sleeping  0 0 0 0 0  Tired, decreased energy 0 0 0 0 0  Change in appetite 0 0 0 0 0  Feeling bad or failure about yourself  0 0 0 0 0  Trouble concentrating 0 0 0 0 0  Moving slowly or fidgety/restless 0 0 0 0 0  Suicidal thoughts 0 0 0 0 0  PHQ-9 Score 0 0 0 0 0  Difficult doing work/chores Not difficult at all Not difficult at all Not difficult at all Not difficult at all Not difficult at all  Some recent data might be hidden   Fall Risk  04/09/2018 03/07/2018 01/10/2018 10/24/2017 10/07/2017  Falls in the past year? 0 0 No No No  Number falls in past yr: 0 - - - -  Injury with Fall? 0 - - - -  Risk for fall due to : - - - Impaired vision -  Risk for fall due to: Comment - - - wears eyeglasses -    Relevant past medical, surgical, family and social history reviewed Past Medical History:  Diagnosis Date  . Arrhythmia   . Cough  CHRONIC  . Depression   . Disturbance of sleep   . Diverticulitis   . GERD (gastroesophageal reflux disease)   . Hemorrhoids   . Hypertension   . OA (osteoarthritis)   . Pure hypercholesterolemia   . Stroke [facility](HCC)    Past Surgical History:  Procedure Laterality Date  . CATARACT EXTRACTION W/PHACO Left 02/14/2016   Procedure: CATARACT EXTRACTION PHACO AND INTRAOCULAR LENS PLACEMENT (IOC);  Surgeon: Galen ManilaWilliam Porfilio, MD;  Location: [facility] ORS;  Service: Ophthalmology;  Laterality: Left;  US 53.7 AP% 21.5 CDE 11.58 FLUID PACK LOT # [id number]  . COLONOSCOPY    . KNEE ARTHROSCOPY    . TRANSURETHRAL RESECTION OF PROSTATE     Family History  Problem Relation Age of Onset  . Hypertension Son        Posey ReaUnsure of parents health.  . Prostate cancer Neg Hx   . Bladder Cancer Neg Hx   . Kidney cancer Neg Hx    Social History   Tobacco Use  . Smoking status: Never Smoker  . Smokeless tobacco: Never Used  . Tobacco comment: smoking cessation materials not required  Substance Use Topics  . Alcohol use: No    Alcohol/week: 0.0 standard drinks  . Drug use: No      [facility]  AUDIT-C Score  0      Interim medical history since last visit reviewed. Allergies and medications reviewed  Review of Systems Per HPI unless specifically indicated above     Objective:    BP 124/68 (BP Location: Right Arm, Patient Position: Sitting, Cuff Size: Large)   Pulse 75   Temp 97.9 F (36.6 C) (Oral)   Resp 16   Ht 6\' 1"  (1.854 m)   Wt 247 lb 1.6 oz (112.1 kg)   SpO2 94%   BMI 32.60 kg/m   Wt Readings from Last 3 Encounters:  04/09/18 247 lb 1.6 oz (112.1 kg)  03/07/18 244 lb 11.2 oz (111 kg)  02/26/18 247 lb 1.6 oz (112.1 kg)    Physical Exam Constitutional:      General: He is not in acute distress.    Appearance: He is well-developed. He is obese.  HENT:     Head: Normocephalic and atraumatic.  Eyes:     General: No scleral icterus. Neck:     Thyroid: No thyromegaly.  Cardiovascular:     Rate and Rhythm: Normal rate and regular rhythm.  Pulmonary:     Effort: Pulmonary effort is normal.     Breath sounds: Normal breath sounds.  Abdominal:     General: Bowel sounds are normal. There is no distension.     Palpations: Abdomen is soft.  Skin:    General: Skin is warm and dry.     Coloration: Skin is not pale.  Neurological:     Mental Status: He is alert.     Coordination: Coordination normal.  Psychiatric:        Behavior: Behavior normal.        Thought Content: Thought content normal.        Judgment: Judgment normal.     Results for orders placed or performed in visit on 02/26/18  Bladder Scan (Post Void Residual) in office  Result Value Ref Range   Scan Result 0 ml       Assessment & Plan:   Problem List Items Addressed This Visit      Cardiovascular and Mediastinum   Essential hypertension (Chronic)  Well-controlled, not adding salt        Respiratory   Allergic rhinitis due to pollen    More year round, some postnasal drip; he wishes to leave meds alone         Digestive   Gastro-esophageal reflux disease without esophagitis    Doing well, no red flags        Other   Prediabetes    Encouraged weight loss; check A1c today      Relevant Orders   Hemoglobin A1c   Obesity    Try to lose 10 pounds over the next 2-3 months, gradually, limit portions, don't snack late at night      HLD (hyperlipidemia) (Chronic)    Check cholesterol, limit saturated fats      Relevant Orders   Lipid panel   General unsteadiness    Going to work with PT soon, seeing neuro soon      Elevated hematocrit    Followed by hematologist      Dizziness    Seeing Dr. Malvin JohnsPotter soon          Follow up plan: Return in about 6 months (around 10/08/2018) for follow-up visit with Dr. Sherie DonLada.  An after-visit summary was printed and given to the patient at check-out.  Please see the patient instructions which may contain other information and recommendations beyond what is mentioned above in the assessment and plan.  No orders of the defined types were placed in this encounter.   Orders Placed This Encounter  Procedures  . Lipid panel  . Hemoglobin A1c

## 2018-04-09 NOTE — Assessment & Plan Note (Signed)
Followed by hematologist 

## 2018-04-09 NOTE — Assessment & Plan Note (Signed)
Well-controlled, not adding salt

## 2018-04-09 NOTE — Assessment & Plan Note (Signed)
More year round, some postnasal drip; he wishes to leave meds alone

## 2018-04-10 LAB — HEMOGLOBIN A1C
EAG (MMOL/L): 5.5 (calc)
HEMOGLOBIN A1C: 5.1 %{Hb} (ref <5.7)
MEAN PLASMA GLUCOSE: 100 (calc)

## 2018-04-10 LAB — LIPID PANEL
Cholesterol: 131 mg/dL (ref <200)
HDL: 41 mg/dL (ref >40)
LDL Cholesterol (Calc): 71 mg/dL (calc)
NON-HDL CHOLESTEROL (CALC): 90 mg/dL (ref <130)
Total CHOL/HDL Ratio: 3.2 (calc) (ref <5.0)
Triglycerides: 102 mg/dL (ref <150)

## 2018-04-10 NOTE — Progress Notes (Signed)
Jeffery Campbell, please let the patient know that his cholesterol is almost perfect. Continue trying to eat well, try to cut back a little more on saturated fats (foods from cows and pigs). Work on a little weight loss, and I think he can get the LDL down under 70 without me adding more medicine. His 3 month blood sugar average is excellent. Thank you

## 2018-04-15 ENCOUNTER — Other Ambulatory Visit: Payer: Self-pay | Admitting: Family Medicine

## 2018-04-15 DIAGNOSIS — R42 Dizziness and giddiness: Secondary | ICD-10-CM | POA: Diagnosis not present

## 2018-04-23 ENCOUNTER — Other Ambulatory Visit: Payer: Self-pay | Admitting: Family Medicine

## 2018-04-23 NOTE — Telephone Encounter (Signed)
CCB not due until the end of next month

## 2018-04-28 DIAGNOSIS — G4736 Sleep related hypoventilation in conditions classified elsewhere: Secondary | ICD-10-CM | POA: Diagnosis not present

## 2018-04-28 DIAGNOSIS — G4733 Obstructive sleep apnea (adult) (pediatric): Secondary | ICD-10-CM | POA: Diagnosis not present

## 2018-04-28 DIAGNOSIS — D751 Secondary polycythemia: Secondary | ICD-10-CM | POA: Diagnosis not present

## 2018-05-15 ENCOUNTER — Ambulatory Visit: Payer: PPO

## 2018-05-20 ENCOUNTER — Ambulatory Visit (INDEPENDENT_AMBULATORY_CARE_PROVIDER_SITE_OTHER): Payer: PPO | Admitting: Family Medicine

## 2018-05-20 ENCOUNTER — Encounter: Payer: Self-pay | Admitting: Family Medicine

## 2018-05-20 ENCOUNTER — Ambulatory Visit: Payer: PPO

## 2018-05-20 ENCOUNTER — Ambulatory Visit: Payer: PPO | Admitting: Family Medicine

## 2018-05-20 DIAGNOSIS — D751 Secondary polycythemia: Secondary | ICD-10-CM

## 2018-05-20 DIAGNOSIS — I10 Essential (primary) hypertension: Secondary | ICD-10-CM

## 2018-05-20 DIAGNOSIS — R269 Unspecified abnormalities of gait and mobility: Secondary | ICD-10-CM

## 2018-05-20 DIAGNOSIS — G4734 Idiopathic sleep related nonobstructive alveolar hypoventilation: Secondary | ICD-10-CM

## 2018-05-20 DIAGNOSIS — E782 Mixed hyperlipidemia: Secondary | ICD-10-CM | POA: Diagnosis not present

## 2018-05-20 DIAGNOSIS — H8113 Benign paroxysmal vertigo, bilateral: Secondary | ICD-10-CM

## 2018-05-20 DIAGNOSIS — J301 Allergic rhinitis due to pollen: Secondary | ICD-10-CM | POA: Diagnosis not present

## 2018-05-20 DIAGNOSIS — Z6832 Body mass index (BMI) 32.0-32.9, adult: Secondary | ICD-10-CM | POA: Diagnosis not present

## 2018-05-20 DIAGNOSIS — Z8673 Personal history of transient ischemic attack (TIA), and cerebral infarction without residual deficits: Secondary | ICD-10-CM

## 2018-05-20 DIAGNOSIS — E6609 Other obesity due to excess calories: Secondary | ICD-10-CM | POA: Diagnosis not present

## 2018-05-20 MED ORDER — FLUTICASONE PROPIONATE 50 MCG/ACT NA SUSP: 2.0000 | Freq: Every day | NASAL | 11 refills | Status: DC

## 2018-05-20 NOTE — Assessment & Plan Note (Signed)
Try to limit saturated fats; work on 10 pounds of weight loss to get that LDL down just under 70

## 2018-05-20 NOTE — Assessment & Plan Note (Signed)
Continue oxygen therapy.

## 2018-05-20 NOTE — Assessment & Plan Note (Addendum)
Going to start back to PT; he just saw neurologist; doing B12 under the tongue

## 2018-05-20 NOTE — Patient Instructions (Addendum)
Check out the information at familydoctor.org entitled "Nutrition for Weight Loss: What You Need to Know about Fad Diets" Try to lose between 1-2 pounds per week by taking in fewer calories and burning off more calories You can succeed by limiting portions, limiting foods dense in calories and fat, becoming more active, and drinking 8 glasses of water a day (64 ounces) Don't skip meals, especially breakfast, as skipping meals may alter your metabolism Do not use over-the-counter weight loss pills or gimmicks that claim rapid weight loss A healthy BMI (or body mass index) is between 18.5 and 24.9 You can calculate your ideal BMI at the NIH website http://www.nhlbi.nih.gov/health/educational/lose_wt/BMI/bmicalc.htm  Obesity, Adult Obesity is the condition of having too much total body fat. Being overweight or obese means that your weight is greater than what is considered healthy for your body size. Obesity is determined by a measurement called BMI. BMI is an estimate of body fat and is calculated from height and weight. For adults, a BMI of 30 or higher is considered obese. Obesity can eventually lead to other health concerns and major illnesses, including:  Stroke.  Coronary artery disease (CAD).  Type 2 diabetes.  Some types of cancer, including cancers of the colon, breast, uterus, and gallbladder.  Osteoarthritis.  High blood pressure (hypertension).  High cholesterol.  Sleep apnea.  Gallbladder stones.  Infertility problems. What are the causes? The main cause of obesity is taking in (consuming) more calories than your body uses for energy. Other factors that contribute to this condition may include:  Being born with genes that make you more likely to become obese.  Having a medical condition that causes obesity. These conditions include: ? Hypothyroidism. ? Polycystic ovarian syndrome (PCOS). ? Binge-eating disorder. ? Cushing syndrome.  Taking certain medicines, such  as steroids, antidepressants, and seizure medicines.  Not being physically active (sedentary lifestyle).  Living where there are limited places to exercise safely or buy healthy foods.  Not getting enough sleep. What increases the risk? The following factors may increase your risk of this condition:  Having a family history of obesity.  Being a woman of African-American descent.  Being a man of Hispanic descent. What are the signs or symptoms? Having excessive body fat is the main symptom of this condition. How is this diagnosed? This condition may be diagnosed based on:  Your symptoms.  Your medical history.  A physical exam. Your health care provider may measure: ? Your BMI. If you are an adult with a BMI between 25 and less than 30, you are considered overweight. If you are an adult with a BMI of 30 or higher, you are considered obese. ? The distances around your hips and your waist (circumferences). These may be compared to each other to help diagnose your condition. ? Your skinfold thickness. Your health care provider may gently pinch a fold of your skin and measure it. How is this treated? Treatment for this condition often includes changing your lifestyle. Treatment may include some or all of the following:  Dietary changes. Work with your health care provider and a dietitian to set a weight-loss goal that is healthy and reasonable for you. Dietary changes may include eating: ? Smaller portions. A portion size is the amount of a particular food that is healthy for you to eat at one time. This varies from person to person. ? Low-calorie or low-fat options. ? More whole grains, fruits, and vegetables.  Regular physical activity. This may include aerobic activity (cardio) and   strength training.  Medicine to help you lose weight. Your health care provider may prescribe medicine if you are unable to lose 1 pound a week after 6 weeks of eating more healthily and doing more  physical activity.  Surgery. Surgical options may include gastric banding and gastric bypass. Surgery may be done if: ? Other treatments have not helped to improve your condition. ? You have a BMI of 40 or higher. ? You have life-threatening health problems related to obesity. Follow these instructions at home:  Eating and drinking   Follow recommendations from your health care provider about what you eat and drink. Your health care provider may advise you to: ? Limit fast foods, sweets, and processed snack foods. ? Choose low-fat options, such as low-fat milk instead of whole milk. ? Eat 5 or more servings of fruits or vegetables every day. ? Eat at home more often. This gives you more control over what you eat. ? Choose healthy foods when you eat out. ? Learn what a healthy portion size is. ? Keep low-fat snacks on hand. ? Avoid sugary drinks, such as soda, fruit juice, iced tea sweetened with sugar, and flavored milk. ? Eat a healthy breakfast.  Drink enough water to keep your urine clear or pale yellow.  Do not go without eating for long periods of time (do not fast) or follow a fad diet. Fasting and fad diets can be unhealthy and even dangerous. Physical Activity  Exercise regularly, as told by your health care provider. Ask your health care provider what types of exercise are safe for you and how often you should exercise.  Warm up and stretch before being active.  Cool down and stretch after being active.  Rest between periods of activity. Lifestyle  Limit the time that you spend in front of your TV, computer, or video game system.  Find ways to reward yourself that do not involve food.  Limit alcohol intake to no more than 1 drink a day for nonpregnant women and 2 drinks a day for men. One drink equals 12 oz of beer, 5 oz of wine, or 1 oz of hard liquor. General instructions  Keep a weight loss journal to keep track of the food you eat and how much you exercise you  get.  Take over-the-counter and prescription medicines only as told by your health care provider.  Take vitamins and supplements only as told by your health care provider.  Consider joining a support group. Your health care provider may be able to recommend a support group.  Keep all follow-up visits as told by your health care provider. This is important. Contact a health care provider if:  You are unable to meet your weight loss goal after 6 weeks of dietary and lifestyle changes. This information is not intended to replace advice given to you by your health care provider. Make sure you discuss any questions you have with your health care provider. Document Released: 04/26/2004 Document Revised: 08/22/2015 Document Reviewed: 01/05/2015 Elsevier Interactive Patient Education  2019 Elsevier Inc.  Preventing Unhealthy Weight Gain, Adult Staying at a healthy weight is important to your overall health. When fat builds up in your body, you may become overweight or obese. Being overweight or obese increases your risk of developing certain health problems, such as heart disease, diabetes, sleeping problems, joint problems, and some types of cancer. Unhealthy weight gain is often the result of making unhealthy food choices or not getting enough exercise. You can make changes   to your lifestyle to prevent obesity and stay as healthy as possible. What nutrition changes can be made?   Eat only as much as your body needs. To do this: ? Pay attention to signs that you are hungry or full. Stop eating as soon as you feel full. ? If you feel hungry, try drinking water first before eating. Drink enough water so your urine is clear or pale yellow. ? Eat smaller portions. Pay attention to portion sizes when eating out. ? Look at serving sizes on food labels. Most foods contain more than one serving per container. ? Eat the recommended number of calories for your gender and activity level. For most active  people, a daily total of 2,000 calories is appropriate. If you are trying to lose weight or are not very active, you may need to eat fewer calories. Talk with your health care provider or a diet and nutrition specialist (dietitian) about how many calories you need each day.  Choose healthy foods, such as: ? Fruits and vegetables. At each meal, try to fill at least half of your plate with fruits and vegetables. ? Whole grains, such as whole-wheat bread, brown rice, and quinoa. ? Lean meats, such as chicken or fish. ? Other healthy proteins, such as beans, eggs, or tofu. ? Healthy fats, such as nuts, seeds, fatty fish, and olive oil. ? Low-fat or fat-free dairy products.  Check food labels, and avoid food and drinks that: ? Are high in calories. ? Have added sugar. ? Are high in sodium. ? Have saturated fats or trans fats.  Cook foods in healthier ways, such as by baking, broiling, or grilling.  Make a meal plan for the week, and shop with a grocery list to help you stay on track with your purchases. Try to avoid going to the grocery store when you are hungry.  When grocery shopping, try to shop around the outside of the store first, where the fresh foods are. Doing this helps you to avoid prepackaged foods, which can be high in sugar, salt (sodium), and fat. What lifestyle changes can be made?   Exercise for 30 or more minutes on 5 or more days each week. Exercising may include brisk walking, yard work, biking, running, swimming, and team sports like basketball and soccer. Ask your health care provider which exercises are safe for you.  Do muscle-strengthening activities, such as lifting weights or using resistance bands, on 2 or more days a week.  Do not use any products that contain nicotine or tobacco, such as cigarettes and e-cigarettes. If you need help quitting, ask your health care provider.  Limit alcohol intake to no more than 1 drink a day for nonpregnant women and 2 drinks a  day for men. One drink equals 12 oz of beer, 5 oz of wine, or 1 oz of hard liquor.  Try to get 7-9 hours of sleep each night. What other changes can be made?  Keep a food and activity journal to keep track of: ? What you ate and how many calories you had. Remember to count the calories in sauces, dressings, and side dishes. ? Whether you were active, and what exercises you did. ? Your calorie, weight, and activity goals.  Check your weight regularly. Track any changes. If you notice you have gained weight, make changes to your diet or activity routine.  Avoid taking weight-loss medicines or supplements. Talk to your health care provider before starting any new medicine or supplement.  Talk   to your health care provider before trying any new diet or exercise plan. Why are these changes important? Eating healthy, staying active, and having healthy habits can help you to prevent obesity. Those changes also:  Help you manage stress and emotions.  Help you connect with friends and family.  Improve your self-esteem.  Improve your sleep.  Prevent long-term health problems. What can happen if changes are not made? Being obese or overweight can cause you to develop joint or bone problems, which can make it hard for you to stay active or do activities you enjoy. Being obese or overweight also puts stress on your heart and lungs and can lead to health problems like diabetes, heart disease, and some cancers. Where to find more information Talk with your health care provider or a dietitian about healthy eating and healthy lifestyle choices. You may also find information from:  U.S. Department of Agriculture, MyPlate: www.choosemyplate.gov  American Heart Association: www.heart.org  Centers for Disease Control and Prevention: www.cdc.gov Summary  Staying at a healthy weight is important to your overall health. It helps you to prevent certain diseases and health problems, such as heart  disease, diabetes, joint problems, sleep disorders, and some types of cancer.  Being obese or overweight can cause you to develop joint or bone problems, which can make it hard for you to stay active or do activities you enjoy.  You can prevent unhealthy weight gain by eating a healthy diet, exercising regularly, not smoking, limiting alcohol, and getting enough sleep.  Talk with your health care provider or a dietitian for guidance about healthy eating and healthy lifestyle choices. This information is not intended to replace advice given to you by your health care provider. Make sure you discuss any questions you have with your health care provider. Document Released: 03/20/2016 Document Revised: 12/28/2016 Document Reviewed: 04/25/2016 Elsevier Interactive Patient Education  2019 Elsevier Inc.  

## 2018-05-20 NOTE — Assessment & Plan Note (Signed)
Work on losing 10 pounds over the next 3 months

## 2018-05-20 NOTE — Assessment & Plan Note (Signed)
Continue aspirin and plavix. 

## 2018-05-20 NOTE — Assessment & Plan Note (Signed)
Resolved with night-time oxygen

## 2018-05-20 NOTE — Assessment & Plan Note (Signed)
Start back on nasal corticosteroid 

## 2018-05-20 NOTE — Assessment & Plan Note (Signed)
Controlled today; continue medicine, limit salt

## 2018-05-20 NOTE — Assessment & Plan Note (Signed)
Just saw neurologist; continue meclizine prn

## 2018-05-20 NOTE — Progress Notes (Signed)
BP 124/70   Pulse 74   Temp 98.1 F (36.7 C)   Ht 6\' 1"  (1.854 m)   Wt 247 lb 12.8 oz (112.4 kg)   SpO2 97%   BMI 32.69 kg/m    Subjective:    Patient ID: Jeffery Campbell, male    DOB: 1928-04-16, 85 y.o.   MRN: [id number]  HPI: Jeffery Campbell is a 85 y.o. male  Chief Complaint  Patient presents with  . Follow-up    HPI  He is here for f/u  Complains of runny nose; just getting by with it, not taking anything; slacked up now a little bit He has flonase on his med list, but has not been taking it regular  He has a hx of prostate trouble; making water okay but leaking right after; no dysuria; no strong odor; taking finasteride  HTN; checking BP once in a while; 120-130 range; no added salt; taking diltiazem  High cholesterol; he is just a point or two off from ideal LDL; not much red meat  Lab Results  Component Value Date   CHOL 131 04/09/2018   HDL 41 04/09/2018   LDLCALC 71 04/09/2018   TRIG 102 04/09/2018   CHOLHDL 3.2 04/09/2018   Still has dizziness; taking meclizine; laying down at night causes dizzy spells; saw neurologist Dr. Malvin Johns April 2019 and again January 2020; "he didn't see nothing wrong"; note reviewed DATA SUMMARY: 03/21/2017  MR BRAIN WITHOUT CONTRAST IMPRESSION: No acute finding by MRI. 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 Mild chronic small-vessel ischemic changes of the hemispheric white matter. Cerebellar atrophy.  Prediabetes Lab Results  Component Value Date   HGBA1C 5.1 04/09/2018    Works at [employer] part-time; they want a letter saying he can't work until July 02, 2018; he could work but just doesn't want to work; he wants to do exercises; he says he is going to do physical therapy for walking; "I'm about ready to quit anyway"  Hx of high Hgb, was 19.2 in October 2019; went to specialist; now down to 16.7; wearing oxygen at night  Depression screen [facility] 2/9 05/20/2018 04/09/2018 03/07/2018 01/10/2018 10/24/2017  Decreased Interest  0 0 0 0 0  Down, Depressed, Hopeless 0 0 0 0 0  PHQ - 2 Score 0 0 0 0 0  Altered sleeping 0 0 0 0 0  Tired, decreased energy 0 0 0 0 0  Change in appetite 0 0 0 0 0  Feeling bad or failure about yourself  0 0 0 0 0  Trouble concentrating 0 0 0 0 0  Moving slowly or fidgety/restless 0 0 0 0 0  Suicidal thoughts 0 0 0 0 0  PHQ-9 Score 0 0 0 0 0  Difficult doing work/chores Not difficult at all Not difficult at all Not difficult at all Not difficult at all Not difficult at all  Some recent data might be hidden   Fall Risk  05/20/2018 04/09/2018 03/07/2018 01/10/2018 10/24/2017  Falls in the past year? 0 0 0 No No  Number falls in past yr: - 0 - - -  Injury with Fall? - 0 - - -  Risk for fall due to : - - - - Impaired vision  Risk for fall due to: Comment - - - - wears eyeglasses    Relevant past medical, surgical, family and social history reviewed Past Medical History:  Diagnosis Date  . Arrhythmia   . Cough    CHRONIC  . Depression   .  Disturbance of sleep   . Diverticulitis   . GERD (gastroesophageal reflux disease)   . Hemorrhoids   . Hypertension   . OA (osteoarthritis)   . Pure hypercholesterolemia   . Stroke [facility](HCC)    Past Surgical History:  Procedure Laterality Date  . CATARACT EXTRACTION W/PHACO Left 02/14/2016   Procedure: CATARACT EXTRACTION PHACO AND INTRAOCULAR LENS PLACEMENT (IOC);  Surgeon: Galen ManilaWilliam Porfilio, MD;  Location: [facility] ORS;  Service: Ophthalmology;  Laterality: Left;  US 53.7 AP% 21.5 CDE 11.58 FLUID PACK LOT # [id number] H  . COLONOSCOPY    . KNEE ARTHROSCOPY    . TRANSURETHRAL RESECTION OF PROSTATE     Family History  Problem Relation Age of Onset  . Hypertension Son        Posey ReaUnsure of parents health.  . Prostate cancer Neg Hx   . Bladder Cancer Neg Hx   . Kidney cancer Neg Hx    Social History   Tobacco Use  . Smoking status: Never Smoker  . Smokeless tobacco: Never Used  . Tobacco comment: smoking cessation materials not required  Substance  Use Topics  . Alcohol use: No    Alcohol/week: 0.0 standard drinks  . Drug use: No     [facility]  AUDIT-C Score  0      Interim medical history since last visit reviewed. Allergies and medications reviewed  Review of Systems Per HPI unless specifically indicated above     Objective:    BP 124/70   Pulse 74   Temp 98.1 F (36.7 C)   Ht 6\' 1"  (1.854 m)   Wt 247 lb 12.8 oz (112.4 kg)   SpO2 97%   BMI 32.69 kg/m   Wt Readings from Last 3 Encounters:  05/20/18 247 lb 12.8 oz (112.4 kg)  04/09/18 247 lb 1.6 oz (112.1 kg)  03/07/18 244 lb 11.2 oz (111 kg)    Physical Exam Constitutional:      General: He is not in acute distress.    Appearance: He is well-developed. He is obese.  HENT:     Head: Normocephalic and atraumatic.  Eyes:     General: No scleral icterus. Neck:     Thyroid: No thyromegaly.  Cardiovascular:     Rate and Rhythm: Normal rate and regular rhythm.  Pulmonary:     Effort: Pulmonary effort is normal.     Breath sounds: Normal breath sounds.  Abdominal:     General: Bowel sounds are normal. There is no distension.     Palpations: Abdomen is soft.  Musculoskeletal:     Right lower leg: No edema.     Left lower leg: No edema.  Skin:    General: Skin is warm and dry.     Coloration: Skin is not pale.  Neurological:     Mental Status: He is alert.     Coordination: Coordination normal.  Psychiatric:        Behavior: Behavior normal.        Thought Content: Thought content normal.        Judgment: Judgment normal.     Results for orders placed or performed in visit on 04/09/18  Lipid panel  Result Value Ref Range   Cholesterol 131 <200 mg/dL   HDL 41 >16>40 mg/dL   Triglycerides 109102 <604<150 mg/dL   LDL Cholesterol (Calc) 71 mg/dL (calc)   Total CHOL/HDL Ratio 3.2 <5.0 (calc)   Non-HDL Cholesterol (Calc) 90 <540<130 mg/dL (calc)  Hemoglobin A1c  Result Value Ref Range   Hgb A1c MFr Bld 5.1  <5.7 % of total Hgb   Mean Plasma Glucose 100 (calc)   eAG (mmol/L) 5.5 (calc)      Assessment & Plan:   Problem List Items Addressed This Visit      Cardiovascular and Mediastinum   Essential hypertension (Chronic)    Controlled today; continue medicine, limit salt        Respiratory   Hypoxia, sleep related    Continue oxygen therapy      Allergic rhinitis due to pollen    Start back on nasal corticosteroid        Nervous and Auditory   Benign paroxysmal positional nystagmus    Just saw neurologist; continue meclizine prn        Other   Obesity    Work on losing 10 pounds over the next 3 months      Hx of completed stroke    Continue aspirin and plavix      HLD (hyperlipidemia) (Chronic)    Try to limit saturated fats; work on 10 pounds of weight loss to get that LDL down just under 70      Erythrocytosis    Resolved with night-time oxygen      Abnormal gait    Going to start back to PT; he just saw neurologist; doing B12 under the tongue          Follow up plan: Return in about 7 weeks (around 07/10/2018) for fasting labs (or just AFTER), appt with Dr. Sherie Don the next week.  An after-visit summary was printed and given to the patient at check-out.  Please see the patient instructions which may contain other information and recommendations beyond what is mentioned above in the assessment and plan.  Meds ordered this encounter  Medications  . fluticasone (FLONASE) 50 MCG/ACT nasal spray    Sig: Place 2 sprays into both nostrils daily.    Dispense:  16 g    Refill:  11    May leave on file until needed    No orders of the defined types were placed in this encounter.

## 2018-05-22 ENCOUNTER — Ambulatory Visit: Payer: PPO

## 2018-05-23 ENCOUNTER — Ambulatory Visit: Payer: PPO | Admitting: Family Medicine

## 2018-05-23 ENCOUNTER — Other Ambulatory Visit: Payer: Self-pay | Admitting: Nurse Practitioner

## 2018-05-26 ENCOUNTER — Encounter: Payer: Self-pay | Admitting: Nurse Practitioner

## 2018-05-26 ENCOUNTER — Ambulatory Visit (INDEPENDENT_AMBULATORY_CARE_PROVIDER_SITE_OTHER): Payer: PPO | Admitting: Nurse Practitioner

## 2018-05-26 VITALS — BP 128/68 | HR 91 | Temp 100.8°F | Resp 18 | Ht 73.0 in | Wt 250.3 lb

## 2018-05-26 DIAGNOSIS — R509 Fever, unspecified: Secondary | ICD-10-CM

## 2018-05-26 DIAGNOSIS — K5901 Slow transit constipation: Secondary | ICD-10-CM | POA: Diagnosis not present

## 2018-05-26 DIAGNOSIS — R05 Cough: Secondary | ICD-10-CM | POA: Diagnosis not present

## 2018-05-26 DIAGNOSIS — R059 Cough, unspecified: Secondary | ICD-10-CM

## 2018-05-26 MED ORDER — DM-GUAIFENESIN ER 30-600 MG PO TB12: 1.0000 | ORAL_TABLET | Freq: Two times a day (BID) | ORAL | 0 refills | Status: DC | PRN

## 2018-05-26 MED ORDER — ACETAMINOPHEN 500 MG PO TABS: 500.0000 mg | ORAL_TABLET | Freq: Three times a day (TID) | ORAL | 0 refills | Status: DC | PRN

## 2018-05-26 MED ORDER — DOCUSATE SODIUM 100 MG PO CAPS: 100.0000 mg | ORAL_CAPSULE | Freq: Two times a day (BID) | ORAL | 0 refills | Status: DC | PRN

## 2018-05-26 NOTE — Patient Instructions (Addendum)
-   Take acetaminophen (tylenol)  650mg -1000mg  every 8 hours as needed for fever. Please take one dose when you get home as you have a fever know.  -If you develop shortness of breath or fatigue or temperature is not controlled with tylenol then please let us know right a way- if it is severe or you develop chest pain or confusion call 911.   -Take metamucil 1 teaspoon twice a day and docusate sodium (colace) 100mg  1- 2 times daily to help with bowel movements.  _____ You likely have a viral upper respiratory infection (URI). Antibiotics will not reduce the number of days you are ill or prevent you from getting bacterial rhinosinusitis. A URI can take up to 14 days to resolve, but typically last between 7-11 days. Your body is so smart and strong that it will be fighting this illness off for you but it is important that you drink plenty of fluids, rest. Cover your nose/mouth when you cough or sneeze and wash your hands well and often. Here are some helpful things you can use or pick up over the counter from the pharmacy to help with your symptoms:   For Fever/Pain: Acetaminophen every 6 hours as needed (maximum of 3000mg  a day). If you are still uncomfortable you can add ibuprofen OR naproxen  For coughing: try dextromethorphan for a cough suppressant, and/or a cool mist humidifier, lozenges  For sore throat: saline gargles, honey herbal tea, lozenges, throat spray  To dry out your nose: try an antihistamine like loratadine (non-sedating) or diphenhydramine (sedating) or others To relieve a stuffy nose: try a decongestant like flonase or the neti pot To make blowing your nose easier and relieve chest congestion: guaifenesin 400mg  every 4-6 hours of guaifenesin ER [phone] mg every 12 hours. Do not take more than 2,400mg  a day.   For constipation: Always:  - Increase water intake - Increase activity as tolerated.  _______________ Can Do always or sometimes:   - Metamucil Powder; 1 teaspon start once  a day for 3 days then twice a day for 3 days then can go up to 3 times a day if needed.  - Take docusate sodium 100 mg twice a day  ________ IF you are STILL constipated you can take: the following.  Rarely just as needed; Please follow direction on box:   - Take Polyethylene glycol 17 grams per day  OR  - Sennosides 8.6 mg: 2 tablets orally once a day at bedtime   _______ If unable to have Bowel movement, having abdominal pain, or nausea and vomiting please get medical assistance immediately.

## 2018-05-26 NOTE — Progress Notes (Signed)
Name: Jeffery Campbell   MRN: [id number]    DOB: [birth year]   Date:05/26/2018       Progress Note  Subjective  Chief Complaint  Chief Complaint  Patient presents with  . Sore Throat  . Fever  . Cough    HPI  Patient endorses subjective fever and cough started today, states had some rhinorrhea yesterday and today. Has not taken anything OTC. States he is never sick so wanted to come in and get checked out.  Denies sore throat, shortness of breath, chest pain, body aches, nausea, vomiting, diarrhea.   + GERD, CKD, HTN  Patient Active Problem List   Diagnosis Date Noted  . Erythrocytosis 01/31/2018  . Hx of completed stroke 01/10/2018  . Prediabetes 10/07/2017  . Dizziness 07/03/2017  . Abnormal gait 10/05/2016  . Hypoxia, sleep related 04/20/2016  . Elevated hematocrit 04/04/2016  . Medication monitoring encounter 03/30/2016  . Right flank pain 01/10/2016  . General unsteadiness 05/24/2015  . Allergic rhinitis due to pollen 11/12/2014  . Back pain, chronic 10/21/2014  . Benign paroxysmal positional nystagmus 10/21/2014  . Decreased libido 10/21/2014  . Fatigue 10/21/2014  . Gastro-esophageal reflux disease without esophagitis 10/21/2014  . Dysmetabolic syndrome 10/21/2014  . Fungal infection of nail 10/21/2014  . HLD (hyperlipidemia) 08/19/2014  . CKD (chronic kidney disease) stage 3, GFR 30-59 ml/min (HCC) 08/19/2014  . Obesity   . Asymptomatic PVCs 05/27/2014  . Essential hypertension 04/05/2014  . Osteoarthrosis involving more than one site but not generalized 08/03/2008    Past Medical History:  Diagnosis Date  . Arrhythmia   . Cough    CHRONIC  . Depression   . Disturbance of sleep   . Diverticulitis   . GERD (gastroesophageal reflux disease)   . Hemorrhoids   . Hypertension   . OA (osteoarthritis)   . Pure hypercholesterolemia   . Stroke [facility])     Past Surgical History:  Procedure Laterality Date  . CATARACT EXTRACTION W/PHACO Left 02/14/2016   Procedure: CATARACT EXTRACTION PHACO AND INTRAOCULAR LENS PLACEMENT (IOC);  Surgeon: Galen Manila, MD;  Location: [facility] ORS;  Service: Ophthalmology;  Laterality: Left;  [place] 53.7 AP% 21.5 CDE 11.58 FLUID PACK LOT # [id number] H  . COLONOSCOPY    . KNEE ARTHROSCOPY    . TRANSURETHRAL RESECTION OF PROSTATE      Social History   Tobacco Use  . Smoking status: Never Smoker  . Smokeless tobacco: Never Used  . Tobacco comment: smoking cessation materials not required  Substance Use Topics  . Alcohol use: No    Alcohol/week: 0.0 standard drinks     Current Outpatient Medications:  .  alfuzosin (UROXATRAL) 10 MG 24 hr tablet, Take 1 tablet (10 mg total) by mouth daily with breakfast., Disp: 90 tablet, Rfl: 3 .  atorvastatin (LIPITOR) 20 MG tablet, TAKE 1 TABLET BY MOUTH EVERYDAY AT BEDTIME, Disp: 90 tablet, Rfl: 1 .  clopidogrel (PLAVIX) 75 MG tablet, TAKE 1 TABLET (75 MG TOTAL) BY MOUTH DAILY. (STOP AGGRENOX) (Patient taking differently: Take 75 mg by mouth daily. ), Disp: 90 tablet, Rfl: 3 .  Cyanocobalamin (B-12) 3000 MCG SUBL, Place 3,000 mcg under the tongue daily. , Disp: 30 tablet, Rfl: 11 .  diltiazem (CARDIZEM CD) 120 MG 24 hr capsule, TAKE 1 CAPSULE BY MOUTH EVERY DAY, Disp: 90 capsule, Rfl: 1 .  finasteride (PROSCAR) 5 MG tablet, Take 1 tablet (5 mg total) by mouth daily., Disp: 90 tablet, Rfl: 3 .  fluticasone (FLONASE) 50 MCG/ACT nasal  spray, Place 2 sprays into both nostrils daily., Disp: 16 g, Rfl: 11 .  meclizine (ANTIVERT) 25 MG tablet, TAKE 1/2 TO 1 TABLET (12.5-25 MG TOTAL) BY MOUTH 3 (THREE) TIMES DAILY AS NEEDED FOR DIZZINESS., Disp: 30 tablet, Rfl: 2 .  Omega 3 1200 MG CAPS, Take 1,200 mg by mouth daily. , Disp: , Rfl:  .  ranitidine (ZANTAC) 150 MG tablet, TAKE 1 TABLET (150 MG TOTAL) BY MOUTH AT BEDTIME. TO PROTECT STOMACH -- STOP LANSOPRAZOLE (Patient taking differently: Take 150 mg by mouth at bedtime. ), Disp: 90 tablet, Rfl: 1  No Known Allergies  ROS   No  other specific complaints in a complete review of systems (except as listed in HPI above).  Objective  Vitals:   05/26/18 1336  BP: 128/68  Pulse: 91  Resp: 18  Temp: (!) 100.8 F (38.2 C)  TempSrc: Oral  SpO2: 92%  Weight: 250 lb 4.8 oz (113.5 kg)  Height: 6\' 1"  (1.854 m)    Body mass index is 33.02 kg/m.  Nursing Note and Vital Signs reviewed.  Physical Exam HENT:     Head: Normocephalic and atraumatic.     Right Ear: Hearing, tympanic membrane, ear canal and external ear normal.     Left Ear: Hearing, tympanic membrane, ear canal and external ear normal.     Nose: Nose normal.     Right Sinus: No maxillary sinus tenderness or frontal sinus tenderness.     Left Sinus: No maxillary sinus tenderness or frontal sinus tenderness.     Mouth/Throat:     Mouth: Mucous membranes are moist.     Pharynx: Uvula midline. No oropharyngeal exudate or posterior oropharyngeal erythema.  Eyes:     General:        Right eye: No discharge.        Left eye: No discharge.     Conjunctiva/sclera: Conjunctivae normal.  Neck:     Musculoskeletal: Normal range of motion.  Cardiovascular:     Rate and Rhythm: Normal rate.  Pulmonary:     Effort: Pulmonary effort is normal.     Breath sounds: Wheezing (inspiratory wheezing right lobe- cleared with coughing) present.  Lymphadenopathy:     Cervical: No cervical adenopathy.  Skin:    General: Skin is warm and dry.     Findings: No rash.  Neurological:     Mental Status: He is alert.  Psychiatric:        Judgment: Judgment normal.      No results found for this or any previous visit (from the past 48 hour(s)).  Assessment & Plan  1. Cough If unimproved by one week or worsening at any time instructed to let us know.  - dextromethorphan-guaiFENesin (MUCINEX DM) 30-600 MG 12hr tablet; Take 1 tablet by mouth 2 (two) times daily as needed for cough.  Dispense: 30 tablet; Refill: 0  2. Fever, unspecified fever cause - acetaminophen  (TYLENOL) 500 MG tablet; Take 1-2 tablets (500-1,000 mg total) by mouth every 8 (eight) hours as needed for fever.  Dispense: 30 tablet; Refill: 0  3. Slow transit constipation daily as needed for mild constipation.  Dispense: 14 capsule; Refill: 0  Discussed S&S of pneumonia in detail patient will inform us if having this and we will order chest xray. Discussed red glad signs. Lungs clear to auscultation after coughing.   -Red flags and when to present for emergency care or RTC including fever >101.37F, chest pain, shortness of breath, new/worsening/un-resolving symptoms, reviewed  with patient at time of visit. Follow up and care instructions discussed and provided in AVS.

## 2018-05-27 ENCOUNTER — Ambulatory Visit: Payer: PPO

## 2018-05-29 ENCOUNTER — Ambulatory Visit: Payer: PPO

## 2018-05-29 DIAGNOSIS — G4736 Sleep related hypoventilation in conditions classified elsewhere: Secondary | ICD-10-CM | POA: Diagnosis not present

## 2018-05-29 DIAGNOSIS — D751 Secondary polycythemia: Secondary | ICD-10-CM | POA: Diagnosis not present

## 2018-05-29 DIAGNOSIS — G4733 Obstructive sleep apnea (adult) (pediatric): Secondary | ICD-10-CM | POA: Diagnosis not present

## 2018-05-31 DIAGNOSIS — B9689 Other specified bacterial agents as the cause of diseases classified elsewhere: Secondary | ICD-10-CM | POA: Diagnosis not present

## 2018-05-31 DIAGNOSIS — J209 Acute bronchitis, unspecified: Secondary | ICD-10-CM | POA: Diagnosis not present

## 2018-05-31 DIAGNOSIS — J019 Acute sinusitis, unspecified: Secondary | ICD-10-CM | POA: Diagnosis not present

## 2018-06-23 ENCOUNTER — Ambulatory Visit: Payer: PPO | Admitting: Internal Medicine

## 2018-06-23 ENCOUNTER — Other Ambulatory Visit: Payer: PPO

## 2018-06-27 DIAGNOSIS — G4733 Obstructive sleep apnea (adult) (pediatric): Secondary | ICD-10-CM | POA: Diagnosis not present

## 2018-06-27 DIAGNOSIS — D751 Secondary polycythemia: Secondary | ICD-10-CM | POA: Diagnosis not present

## 2018-06-27 DIAGNOSIS — G4736 Sleep related hypoventilation in conditions classified elsewhere: Secondary | ICD-10-CM | POA: Diagnosis not present

## 2018-07-06 ENCOUNTER — Other Ambulatory Visit: Payer: Self-pay | Admitting: Family Medicine

## 2018-07-06 MED ORDER — FAMOTIDINE 20 MG PO TABS: 20.0000 mg | ORAL_TABLET | Freq: Two times a day (BID) | ORAL | 1 refills | Status: DC | PRN

## 2018-07-06 NOTE — Telephone Encounter (Signed)
Rx denial for ranitidine called in personally to pharmacy by MD

## 2018-07-07 ENCOUNTER — Ambulatory Visit (INDEPENDENT_AMBULATORY_CARE_PROVIDER_SITE_OTHER): Payer: PPO | Admitting: Family Medicine

## 2018-07-07 ENCOUNTER — Encounter: Payer: Self-pay | Admitting: Family Medicine

## 2018-07-07 ENCOUNTER — Other Ambulatory Visit: Payer: Self-pay

## 2018-07-07 DIAGNOSIS — K5900 Constipation, unspecified: Secondary | ICD-10-CM | POA: Diagnosis not present

## 2018-07-07 DIAGNOSIS — J301 Allergic rhinitis due to pollen: Secondary | ICD-10-CM

## 2018-07-07 NOTE — Assessment & Plan Note (Signed)
Likely causing his sneezing and runny nose; OTC loratidine PRN

## 2018-07-07 NOTE — Progress Notes (Signed)
There were no vitals taken for this visit.   Subjective:    Patient ID: Jeffery Campbell, male    DOB: Oct 23, 1928, 85 y.o.   MRN: [id number]  HPI: Jeffery Campbell is a 85 y.o. male  Chief Complaint  Patient presents with  . Constipation    Patient states feels sick in the head  . Allergies    runny nose, sneezing    HPI Virtual Visit via Telephone Note   I connect with the patient by telephone on July 07, 2018 I verified that I am speaking with the correct person using two identifiers.   I discussed the limitations, risks, security, and privacy concerns of performing an evaluation and management service by telephone and the availability of in-person appointments. Staff discussed with the patient that he/she may be responsible for charges related to this service. The patient expressed understanding and agreed to proceed.  Patient location: home Provider location: home Additional participants: no one  Call started: 1:57 pm Call terminated: 2:13 pm Total length of call: 14 minutes and 40 seconds  Patient says he has had runny nose and sneezing He says it was pretty bad the other morning He denies fever; he denies cough; he denies new [facility] He has the nasal spray, but just not using  He felt like he was getting sick in the head, then would have a bowel movement and then felt better Last night, he took a stool softener and it was better this morning He is feeling good, had a good BM this morning; ever since he did that, he has been feeling fine He had trouble before this for a few mornings, "stool was hard as I don't know what" Right now, I'm feeling alright No blood in the stool; he did check His stool was black, but he says it wasn't blood; this morning it was light again He has never seen any blood on the paper BM does not have a rich or earthy or bloody smell No abdominal pain at all He stopped the ranitidine (Zantac) because of the recall Does not feel [facility] or tired; "I'm  feeling good now"  Discussed COVID-19 pandemic, discussed s/s to watch for, handwashing  Depression screen [place] 2/9 07/07/2018 07/07/2018 05/26/2018 05/20/2018 04/09/2018  Decreased Interest 0 0 0 0 0  Down, Depressed, Hopeless 0 0 0 0 0  PHQ - 2 Score 0 0 0 0 0  Altered sleeping 0 0 0 0 0  Tired, decreased energy 0 0 0 0 0  Change in appetite 0 0 0 0 0  Feeling bad or failure about yourself  0 0 0 0 0  Trouble concentrating 0 0 0 0 0  Moving slowly or fidgety/restless 0 0 0 0 0  Suicidal thoughts 0 0 0 0 0  PHQ-9 Score 0 0 0 0 0  Difficult doing work/chores Not difficult at all Not difficult at all Not difficult at all Not difficult at all Not difficult at all  Some recent data might be hidden   Fall Risk  07/07/2018 05/26/2018 05/20/2018 04/09/2018 03/07/2018  Falls in the past year? 0 0 0 0 0  Number falls in past yr: 0 0 - 0 -  Injury with Fall? 0 0 - 0 -  Risk for fall due to : - - - - -  Risk for fall due to: Comment - - - - -    Relevant past medical, surgical, family and social history reviewed Past Medical History:  Diagnosis Date  .  Arrhythmia   . Cough    CHRONIC  . Depression   . Disturbance of sleep   . Diverticulitis   . GERD (gastroesophageal reflux disease)   . Hemorrhoids   . Hypertension   . OA (osteoarthritis)   . Pure hypercholesterolemia   . Stroke [facility])    Past Surgical History:  Procedure Laterality Date  . CATARACT EXTRACTION W/PHACO Left 02/14/2016   Procedure: CATARACT EXTRACTION PHACO AND INTRAOCULAR LENS PLACEMENT (IOC);  Surgeon: Galen Manila, MD;  Location: [facility] ORS;  Service: Ophthalmology;  Laterality: Left;  [place] 53.7 AP% 21.5 CDE 11.58 FLUID PACK LOT # [id number] H  . COLONOSCOPY    . KNEE ARTHROSCOPY    . TRANSURETHRAL RESECTION OF PROSTATE     Family History  Problem Relation Age of Onset  . Hypertension Son        Posey Rea of parents health.  . Prostate cancer Neg Hx   . Bladder Cancer Neg Hx   . Kidney cancer Neg Hx    Social History    Tobacco Use  . Smoking status: Never Smoker  . Smokeless tobacco: Never Used  . Tobacco comment: smoking cessation materials not required  Substance Use Topics  . Alcohol use: No    Alcohol/week: 0.0 standard drinks  . Drug use: No     [facility] Visit from 07/07/2018 [facility]  AUDIT-C Score  0      Interim medical history since last visit reviewed. Allergies and medications reviewed  Review of Systems Per HPI unless specifically indicated above     Objective:    There were no vitals taken for this visit.  Wt Readings from Last 3 Encounters:  05/26/18 250 lb 4.8 oz (113.5 kg)  05/20/18 247 lb 12.8 oz (112.4 kg)  04/09/18 247 lb 1.6 oz (112.1 kg)    Physical Exam Pulmonary:     Effort: No respiratory distress.  Neurological:     Mental Status: He is alert.  Psychiatric:        Speech: Speech is not rapid and pressured, delayed or slurred.       Assessment & Plan:   Problem List Items Addressed This Visit      Respiratory   Allergic rhinitis due to pollen    Likely causing his sneezing and runny nose; OTC loratidine PRN       Other Visit Diagnoses    Constipation, unspecified constipation type    -  Primary   will have patient check stool cards; he feels great and stool back to normal; watch stool, go to the ER if SHOB, abd pain, blood in the stools, black stools       Follow up plan: No follow-ups on file.  An after-visit summary was printed and given to the patient at check-out.  Please see the patient instructions which may contain other information and recommendations beyond what is mentioned above in the assessment and plan.  No orders of the defined types were placed in this encounter.   No orders of the defined types were placed in this encounter.

## 2018-07-08 ENCOUNTER — Other Ambulatory Visit: Payer: Self-pay | Admitting: Family Medicine

## 2018-07-21 ENCOUNTER — Ambulatory Visit (INDEPENDENT_AMBULATORY_CARE_PROVIDER_SITE_OTHER): Payer: PPO | Admitting: Nurse Practitioner

## 2018-07-21 ENCOUNTER — Other Ambulatory Visit: Payer: Self-pay

## 2018-07-21 ENCOUNTER — Encounter: Payer: Self-pay | Admitting: Nurse Practitioner

## 2018-07-21 DIAGNOSIS — K5900 Constipation, unspecified: Secondary | ICD-10-CM | POA: Diagnosis not present

## 2018-07-21 DIAGNOSIS — N3943 Post-void dribbling: Secondary | ICD-10-CM

## 2018-07-21 DIAGNOSIS — N401 Enlarged prostate with lower urinary tract symptoms: Secondary | ICD-10-CM | POA: Diagnosis not present

## 2018-07-21 DIAGNOSIS — N183 Chronic kidney disease, stage 3 unspecified: Secondary | ICD-10-CM

## 2018-07-21 NOTE — Progress Notes (Signed)
I connected with Jeffery Campbell on 07/21/18 at 11:00 AM EDT by telephone and verified that I am speaking with the correct person using two identifiers.   Staff discussed the limitations, risks, security and privacy concerns of performing an evaluation and management service by telephone and the availability of in person appointments. Staff also discussed with the patient that there may be a patient responsible charge related to this service. The patient expressed understanding and agreed to proceed.  Patients location: house My location: work office  Other people in meeting: none    HPI  Patient has a history of BPH. Patient endorses he is peeing without issues but notices some dribbling at the end this has been going on for months to years, but thought he would call to mention it. He does drink a lot of sodas and tea. Does not drink much water.  Patient is rx finasteride and the alfuzosin ; states he has been taking it as prescribed.  No dysuria, penile discharge, nausea, vomiting, fatigue.   IPSS Questionnaire (AUA-7): Over the past month.   1)  How often have you had a sensation of not emptying your bladder completely after you finish urinating?  0 - Not at all  2)  How often have you had to urinate again less than two hours after you finished urinating? 1 - Less than 1 time in 5  3)  How often have you found you stopped and started again several times when you urinated?  0 - Not at all  4) How difficult have you found it to postpone urination?  1 - Less than 1 time in 5  5) How often have you had a weak urinary stream?  2 - Less than half the time  6) How often have you had to push or strain to begin urination?  0 - Not at all  7) How many times did you most typically get up to urinate from the time you went to bed until the time you got up in the morning?  1 - 1 times  Total score:  0-7 mildly symptomatic   8-19 moderately symptomatic   20-35 severely symptomatic       PHQ2/9: Depression screen [facility] 2/9 07/21/2018 07/07/2018 07/07/2018 05/26/2018 05/20/2018  Decreased Interest 0 0 0 0 0  Down, Depressed, Hopeless 0 0 0 0 0  PHQ - 2 Score 0 0 0 0 0  Altered sleeping 0 0 0 0 0  Tired, decreased energy 0 0 0 0 0  Change in appetite 0 0 0 0 0  Feeling bad or failure about yourself  0 0 0 0 0  Trouble concentrating 0 0 0 0 0  Moving slowly or fidgety/restless 0 0 0 0 0  Suicidal thoughts 0 0 0 0 0  PHQ-9 Score 0 0 0 0 0  Difficult doing work/chores Not difficult at all Not difficult at all Not difficult at all Not difficult at all Not difficult at all  Some recent data might be hidden     PHQ reviewed. Negative  Patient Active Problem List   Diagnosis Date Noted  . Erythrocytosis 01/31/2018  . Hx of completed stroke 01/10/2018  . Prediabetes 10/07/2017  . Dizziness 07/03/2017  . Abnormal gait 10/05/2016  . Hypoxia, sleep related 04/20/2016  . Elevated hematocrit 04/04/2016  . Medication monitoring encounter 03/30/2016  . Right flank pain 01/10/2016  . General unsteadiness 05/24/2015  . Allergic rhinitis due to pollen 11/12/2014  . Back pain, chronic  10/21/2014  . Benign paroxysmal positional nystagmus 10/21/2014  . Decreased libido 10/21/2014  . Fatigue 10/21/2014  . Gastro-esophageal reflux disease without esophagitis 10/21/2014  . Dysmetabolic syndrome 10/21/2014  . Fungal infection of nail 10/21/2014  . HLD (hyperlipidemia) 08/19/2014  . CKD (chronic kidney disease) stage 3, GFR 30-59 ml/min (HCC) 08/19/2014  . Obesity   . Asymptomatic PVCs 05/27/2014  . Essential hypertension 04/05/2014  . Osteoarthrosis involving more than one site but not generalized 08/03/2008    Past Medical History:  Diagnosis Date  . Arrhythmia   . Cough    CHRONIC  . Depression   . Disturbance of sleep   . Diverticulitis   . GERD (gastroesophageal reflux disease)   . Hemorrhoids   . Hypertension   . OA (osteoarthritis)   . Pure  hypercholesterolemia   . Stroke [facility](HCC)     Past Surgical History:  Procedure Laterality Date  . CATARACT EXTRACTION W/PHACO Left 02/14/2016   Procedure: CATARACT EXTRACTION PHACO AND INTRAOCULAR LENS PLACEMENT (IOC);  Surgeon: Galen ManilaWilliam Porfilio, MD;  Location: [facility] ORS;  Service: Ophthalmology;  Laterality: Left;  US 53.7 AP% 21.5 CDE 11.58 FLUID PACK LOT # [id number]  . COLONOSCOPY    . KNEE ARTHROSCOPY    . TRANSURETHRAL RESECTION OF PROSTATE      Social History   Tobacco Use  . Smoking status: Never Smoker  . Smokeless tobacco: Never Used  . Tobacco comment: smoking cessation materials not required  Substance Use Topics  . Alcohol use: No    Alcohol/week: 0.0 standard drinks     Current Outpatient Medications:  .  acetaminophen (TYLENOL) 500 MG tablet, Take 1-2 tablets (500-1,000 mg total) by mouth every 8 (eight) hours as needed for fever., Disp: 30 tablet, Rfl: 0 .  alfuzosin (UROXATRAL) 10 MG 24 hr tablet, Take 1 tablet (10 mg total) by mouth daily with breakfast., Disp: 90 tablet, Rfl: 3 .  atorvastatin (LIPITOR) 20 MG tablet, TAKE 1 TABLET BY MOUTH EVERYDAY AT BEDTIME, Disp: 90 tablet, Rfl: 1 .  clopidogrel (PLAVIX) 75 MG tablet, TAKE 1 TABLET (75 MG TOTAL) BY MOUTH DAILY. (STOP AGGRENOX) (Patient taking differently: Take 75 mg by mouth daily. ), Disp: 90 tablet, Rfl: 3 .  Cyanocobalamin (B-12) 3000 MCG SUBL, Place 3,000 mcg under the tongue daily. , Disp: 30 tablet, Rfl: 11 .  dextromethorphan-guaiFENesin (MUCINEX DM) 30-600 MG 12hr tablet, Take 1 tablet by mouth 2 (two) times daily as needed for cough., Disp: 30 tablet, Rfl: 0 .  diltiazem (CARDIZEM CD) 120 MG 24 hr capsule, TAKE 1 CAPSULE BY MOUTH EVERY DAY, Disp: 90 capsule, Rfl: 1 .  docusate sodium (COLACE) 100 MG capsule, Take 1 capsule (100 mg total) by mouth 2 (two) times daily as needed for mild constipation., Disp: 14 capsule, Rfl: 0 .  famotidine (PEPCID) 20 MG tablet, Take 1 tablet (20 mg total) by mouth 2 (two)  times daily as needed for heartburn or indigestion., Disp: 180 tablet, Rfl: 1 .  finasteride (PROSCAR) 5 MG tablet, Take 1 tablet (5 mg total) by mouth daily., Disp: 90 tablet, Rfl: 3 .  fluticasone (FLONASE) 50 MCG/ACT nasal spray, Place 2 sprays into both nostrils daily., Disp: 16 g, Rfl: 11 .  meclizine (ANTIVERT) 25 MG tablet, TAKE 1/2 TO 1 TABLET (12.5-25 MG TOTAL) BY MOUTH 3 (THREE) TIMES DAILY AS NEEDED FOR DIZZINESS., Disp: 30 tablet, Rfl: 2 .  Omega 3 1200 MG CAPS, Take 1,200 mg by mouth daily. , Disp: , Rfl:  No Known Allergies  ROS    No other specific complaints in a complete review of systems (except as listed in HPI above).  Objective  There were no vitals filed for this visit.   There is no height or weight on file to calculate BMI.  Patient is alert, able to speak in full sentences without difficulty.   Assessment & Plan  1. Benign prostatic hyperplasia with post-void dribbling Discussed double voiding, reducing caffeine and cutting off liquids after dinner time. Will try this and call urologist if unimproved  2. Constipation, unspecified constipation type Discussed role in incomplete bladder emptying, increase fiber.   3. CKD (chronic kidney disease) stage 3, GFR 30-59 ml/min (HCC) Discussed importance of hydration and reduction of caffeine  I discussed the assessment and treatment plan with the patient. The patient was provided an opportunity to ask questions and all were answered. The patient agreed with the plan and demonstrated an understanding of the instructions.   The patient was advised to call back or seek an in-person evaluation if the symptoms worsen or if the condition fails to improve as anticipated.  I provided 16 minutes of non-face-to-face time during this encounter.   Cheryle Horsfall, NP

## 2018-07-28 DIAGNOSIS — G4733 Obstructive sleep apnea (adult) (pediatric): Secondary | ICD-10-CM | POA: Diagnosis not present

## 2018-07-28 DIAGNOSIS — D751 Secondary polycythemia: Secondary | ICD-10-CM | POA: Diagnosis not present

## 2018-07-28 DIAGNOSIS — G4736 Sleep related hypoventilation in conditions classified elsewhere: Secondary | ICD-10-CM | POA: Diagnosis not present

## 2018-07-31 ENCOUNTER — Telehealth: Payer: Self-pay

## 2018-07-31 NOTE — Telephone Encounter (Signed)
-----   Message from Lilian Kapur sent at 07/31/2018  9:40 AM EDT ----- Regarding: Needs OV or televisit Good Morning Belenda Cruise  I just got off the phone with [place] from [place]. This patient was last seen in 10/2017 and needs to have a current OV or televisit to re certify him for his 02. The notes needs to talk about 02 usage and 02 benefits. Could we get him added to the schedule quickly.  Thanks Synetta Fail

## 2018-07-31 NOTE — Telephone Encounter (Signed)
Called and spoke to patient, set up apt for next week with Dr. Sung Amabile for re certification of O2. Patient states he has been wearing his oxygen at night.

## 2018-08-05 ENCOUNTER — Ambulatory Visit: Payer: PPO | Admitting: Pulmonary Disease

## 2018-08-12 ENCOUNTER — Other Ambulatory Visit: Payer: Self-pay

## 2018-08-13 ENCOUNTER — Encounter: Payer: Self-pay | Admitting: Internal Medicine

## 2018-08-13 ENCOUNTER — Inpatient Hospital Stay: Payer: PPO | Attending: Internal Medicine

## 2018-08-13 ENCOUNTER — Other Ambulatory Visit: Payer: Self-pay

## 2018-08-13 ENCOUNTER — Inpatient Hospital Stay: Payer: PPO | Attending: Internal Medicine | Admitting: Internal Medicine

## 2018-08-13 DIAGNOSIS — D751 Secondary polycythemia: Secondary | ICD-10-CM | POA: Insufficient documentation

## 2018-08-13 DIAGNOSIS — Z79899 Other long term (current) drug therapy: Secondary | ICD-10-CM

## 2018-08-13 DIAGNOSIS — I10 Essential (primary) hypertension: Secondary | ICD-10-CM | POA: Diagnosis not present

## 2018-08-13 LAB — HEMOGLOBIN: Hemoglobin: 17.1 g/dL — ABNORMAL HIGH (ref 13.0–17.0)

## 2018-08-13 LAB — HEMATOCRIT: HCT: 50.1 % (ref 39.0–52.0)

## 2018-08-13 NOTE — Progress Notes (Signed)
I connected with Jeffery Campbell on 08/13/18 at  1:30 PM EDT by telephone visit and verified that I am speaking with the correct person using two identifiers.  I discussed the limitations, risks, security and privacy concerns of performing an evaluation and management service by telemedicine and the availability of in-person appointments. I also discussed with the patient that there may be a patient responsible charge related to this service. The patient expressed understanding and agreed to proceed.    Other persons participating in the visit and their role in the encounter: None Patient's location: Home Provider's location: Office   No history exists.     Chief Complaint: Erythrocytosis   History of present illness:Jeffery Campbell 85 y.o.  male with history of erythrocytosis isolated likely secondary is here for follow-up.  Patient denies any headaches or denies any dizzy spells.  Denies any significant fatigue.  Appetite is fair.  No weight loss.  No night sweats.  Observation/objective: Hemoglobin 17 hematocrit 51.  Assessment and plan: Erythrocytosis # Erythrocytosis isolated-unclear etiology/however secondary-negative Jak 2/normal erythropoietin levels.  #Today hemoglobin 17; hematocrit 51; patient is asymptomatic hold off phlebotomy.  # HTN-stable as per pt.   # DISPOSITION: # Follow up in 6 months/MD/lab- cbc/bmp/possible Phlebtotomy- Dr.B    Follow-up instructions:  I discussed the assessment and treatment plan with the patient.  The patient was provided an opportunity to ask questions and all were answered.  The patient agreed with the plan and demonstrated understanding of instructions.  The patient was advised to call back or seek an in person evaluation if the symptoms worsen or if the condition fails to improve as anticipated.  I provided 12 minutes of non face-to-face telephone visit time during this encounter, and > 50% was spent counseling as documented under my  assessment & plan.   Dr. Louretta Shorten [facility] 08/13/2018 2:34 PM

## 2018-08-13 NOTE — Assessment & Plan Note (Signed)
#   Erythrocytosis isolated-unclear etiology/however secondary-negative Jak 2/normal erythropoietin levels.  #Today hemoglobin 17; hematocrit 51; patient is asymptomatic hold off phlebotomy.  # HTN-stable as per pt.   # DISPOSITION: # Follow up in 6 months/MD/lab- cbc/bmp/possible Phlebtotomy- Dr.B

## 2018-08-18 ENCOUNTER — Telehealth: Payer: Self-pay | Admitting: Family Medicine

## 2018-08-18 NOTE — Telephone Encounter (Signed)
Copied from CRM [id number]. Topic: Quick Communication - Rx Refill/Question >> Aug 18, 2018  9:19 AM Baldo Daub L wrote: Medication: meclizine (ANTIVERT) 25 MG tablet  Has the patient contacted their pharmacy? Yes - [facility] [place] and [facility] is completely out (Agent: If no, request that the patient contact the pharmacy for the refill.) (Agent: If yes, when and what did the pharmacy advise?)  Preferred Pharmacy (with phone number or street name): [facility] [address] [place] [facility] [address] [phone] (Phone) [phone] (Fax)  Agent: Please be advised that RX refills may take up to 3 business days. We ask that you follow-up with your pharmacy.

## 2018-08-19 ENCOUNTER — Ambulatory Visit (INDEPENDENT_AMBULATORY_CARE_PROVIDER_SITE_OTHER): Payer: PPO | Admitting: Pulmonary Disease

## 2018-08-19 ENCOUNTER — Encounter: Payer: Self-pay | Admitting: Pulmonary Disease

## 2018-08-19 DIAGNOSIS — G4734 Idiopathic sleep related nonobstructive alveolar hypoventilation: Secondary | ICD-10-CM | POA: Diagnosis not present

## 2018-08-19 DIAGNOSIS — G4733 Obstructive sleep apnea (adult) (pediatric): Secondary | ICD-10-CM | POA: Diagnosis not present

## 2018-08-19 DIAGNOSIS — Z789 Other specified health status: Secondary | ICD-10-CM

## 2018-08-19 NOTE — Patient Instructions (Signed)
Recertify for nocturnal oxygen therapy.  Continue to wear at 2 LPM [place] with sleep  Follow-up as needed

## 2018-08-19 NOTE — Progress Notes (Signed)
PULMONARY OFFICE FOLLOW UP NOTE  Requesting MD/Service: Lada Date of initial consultation: 05/29/16 Reason for consultation:  Polycythemia  PT PROFILE: 85 y.o. M never smoker referred for evaluation of polycythemia and nocturnal hypoxemia  PROBLEMS: Polycythemia Severe OSA with nocturnal hypoxemia  DATA: Overnight oximetry (RA) 04/10/16: Lowest SpO2 79%, Avg 25 desaturation events per hour Overnight oximetry (2 LPM [place]) 06/04/16: 161 desaturation events, 24 per hour. Lowest SPO2 82% Polysomnogram 07/10/16: Severe OSA with AHI of 40 per hour CPAP titration 07/31/16:Recommendation of AutoSet CPAP 10-20 cm H2O. PLM index 165.5 with arousal index of 4.9. Recommended check of ferritin level Overnight oximetry 06/03/17: SpO2 < 88% 155 min, 36 sec. Lowest SpO2 80%   Virtual Visit via Telephone Note I connected with Nasheed Pacifico on 08/19/18 at  9:30 AM EDT by telephone and verified that I am speaking with the correct person using two identifiers.    I discussed the limitations, risks, security and privacy concerns of performing an evaluation and management service by telephone and the availability of in person appointments. I also discussed with the patient that there may be a patient responsible charge related to this service. The patient expressed understanding and agreed to proceed.  SUBJ: This was a routine reevaluation to recertify him for nocturnal oxygen.  He was initially referred to me for evaluation of polycythemia.  Sleep study demonstrated severe obstructive sleep apnea with desaturations.  He was intolerant to nasal CPAP.  We elected to treat him simply with nocturnal oxygen.  He is wearing it consistently at 2 LPM [place].  He has no new complaints.  He denies significant daytime hypersomnolence.  There were no vitals filed for this visit.Room air   EXAM:  Physical examination could not be performed due to the virtual nature of this visit    DATA:   BMP Latest Ref Rng & Units  02/24/2018 01/31/2018 01/27/2018  Glucose 70 - 99 mg/dL 280(K) 349(Z) 791(T)  BUN 8 - 23 mg/dL 11 15 13   Creatinine 0.61 - 1.24 mg/dL 0.56 9.79 4.80  BUN/Creat Ratio 6 - 22 (calc) - - -  Sodium 135 - 145 mmol/L 140 139 142  Potassium 3.5 - 5.1 mmol/L 4.0 3.7 3.6  Chloride 98 - 111 mmol/L 107 109 108  CO2 22 - 32 mmol/L 26 25 26   Calcium 8.9 - 10.3 mg/dL 9.1 9.1 9.3    CBC Latest Ref Rng & Units 08/13/2018 02/24/2018 01/31/2018  WBC 4.0 - 10.5 K/uL - 5.7 5.9  Hemoglobin 13.0 - 17.0 g/dL 17.1(H) 16.7 17.0  Hematocrit 39.0 - 52.0 % 50.1 49.9 49.6  Platelets 150 - 400 K/uL - 224 216    CXR 02/05/2018: No acute findings  IMPRESSION:     ICD-10-CM   1. Severe OSA G47.33   2. CPAP intolerance Z78.9   3. Nocturnal hypoxemia, compliant with nocturnal oxygen therapy G47.34     PLAN:  Recertify for nocturnal oxygen therapy.  Continue to wear at 2 LPM [place] with sleep  Follow-up as needed   Billy Fischer, MD PCCM service Mobile [phone] Pager [phone] 08/19/2018 9:49 AM

## 2018-08-22 ENCOUNTER — Telehealth: Payer: Self-pay | Admitting: Family Medicine

## 2018-08-22 NOTE — Telephone Encounter (Signed)
Copied from CRM [id number]. Topic: Quick Communication - See Telephone Encounter >> Aug 22, 2018  1:05 PM Angela Nevin wrote: CRM for notification. See Telephone encounter for: 08/22/18.  Patient called to inquire if he is currently on a blood thinner, as advised by dentist. He is requesting call back with this information.

## 2018-08-22 NOTE — Telephone Encounter (Signed)
Pt.notified

## 2018-08-22 NOTE — Telephone Encounter (Signed)
Can stop plavix 3 days prior to surgery if needed. Resume 1 day after procedure unless otherwise instructed by provider.

## 2018-08-22 NOTE — Telephone Encounter (Signed)
How long or when does patient need to stop blood thinner for dental procedure

## 2018-08-26 ENCOUNTER — Ambulatory Visit: Payer: PPO | Admitting: Urology

## 2018-08-28 DIAGNOSIS — D751 Secondary polycythemia: Secondary | ICD-10-CM | POA: Diagnosis not present

## 2018-08-28 DIAGNOSIS — G4736 Sleep related hypoventilation in conditions classified elsewhere: Secondary | ICD-10-CM | POA: Diagnosis not present

## 2018-08-28 DIAGNOSIS — G4733 Obstructive sleep apnea (adult) (pediatric): Secondary | ICD-10-CM | POA: Diagnosis not present

## 2018-08-29 ENCOUNTER — Telehealth: Payer: Self-pay | Admitting: Family Medicine

## 2018-08-29 NOTE — Telephone Encounter (Signed)
@  LOGO@ Chronic Care Management   Outreach Note  08/29/2018 Name: Jeffery Campbell MRN: [id number] DOB: 19-Oct-1928  Referred by: Kerman Passey, MD Reason for referral : Chronic Care Management (Initial Outreach call was unsuccessful)   An unsuccessful telephone outreach was attempted today. The patient was referred to the case management team by for assistance with chronic care management and care coordination.   Follow Up Plan: The care management team will reach out to the patient again over the next 7 days.   Randon Goldsmith Care Guide  [facility]   Connected Care  [email or web address]   ??[phone]

## 2018-09-02 ENCOUNTER — Ambulatory Visit: Payer: Self-pay | Admitting: Family Medicine

## 2018-09-02 NOTE — Chronic Care Management (AMB) (Signed)
Chronic Care Management  ° °Note ° °09/02/2018 °Name: Romari Caperton MRN: [id number] DOB: 01/09/1929 ° °Giulio Krontz is a 85 y.o. year old male who is a primary care patient of Lada, Melinda P, MD. I reached out to Avyukth Ritthaler by phone today in response to a referral sent by Mr. Demani Doggett's health plan.   ° °Mr. Stanczak was given information about Chronic Care Management services today including:  °1. CCM service includes personalized support from designated clinical staff supervised by his physician, including individualized plan of care and coordination with other care providers °2. 24/7 contact phone numbers for assistance for urgent and routine care needs. °3. Service will only be billed when office clinical staff spend 20 minutes or more in a month to coordinate care. °4. Only one practitioner may furnish and bill the service in a calendar month. °5. The patient may stop CCM services at any time (effective at the end of the month) by phone call to the office staff. °6. The patient will be responsible for cost sharing (co-pay) of up to 20% of the service fee (after annual deductible is met). ° °Patient agreed to services and verbal consent obtained.  ° °Follow up plan: °Telephone appointment with CCM team member scheduled for: 09/05/2018 ° °Bernice Cicero °Care Guide • [facility] °Green   Connected Care  °[email or web address]   ??[phone]   ° ° ° °

## 2018-09-05 ENCOUNTER — Other Ambulatory Visit: Payer: Self-pay

## 2018-09-05 ENCOUNTER — Ambulatory Visit (INDEPENDENT_AMBULATORY_CARE_PROVIDER_SITE_OTHER): Payer: PPO

## 2018-09-05 DIAGNOSIS — N183 Chronic kidney disease, stage 3 unspecified: Secondary | ICD-10-CM

## 2018-09-05 DIAGNOSIS — E782 Mixed hyperlipidemia: Secondary | ICD-10-CM | POA: Diagnosis not present

## 2018-09-05 DIAGNOSIS — I10 Essential (primary) hypertension: Secondary | ICD-10-CM

## 2018-09-05 NOTE — Patient Instructions (Signed)
  Thank you allowing the Chronic Care Management Team to be a part of your care! It was a pleasure speaking with you today!  1. Take all your medications as prescribed 2. Attend all provider appointments as scheduled 3. Continue to engage with [facility] services and call your case manager as needed 4. Call CCM RN CM if you are unable to contact [facility] case manager with needs or concerns. I am hre for you.  CCM (Chronic Care Management) Team   Yvone Neu RN, BSN Nurse Care Coordinator  [phone]  Karalee Height PharmD  Clinical Pharmacist  [phone]   Verna Czech, LCSW Clinical Social Worker [phone]  Goals Addressed            This Visit's Progress   . There is other people that are calling me from [facility] (pt-stated)       Current Barriers:  [name] Knowledge Deficits related to understanding [facility] services  Nurse Case Manager Clinical Goal(s):  [name] Over the next 30 days, patient will work with [facility]  to address needs related to chronic health management  Interventions:  . Advised patient to continue to engage with [facility] . Provided education to patient re: services [facility] provides . Reviewed medications with patient and discussed importance of medication adherence . Discussed plans with patient for ongoing care management follow up and provided patient with direct contact information for care management team  Patient Self Care Activities:  . Self administers medications as prescribed . Attends all scheduled provider appointments . Calls pharmacy for medication refills . Attends church or other social activities . Performs ADL's independently . Performs IADL's independently . Calls provider office for new concerns or questions  . Continues to Engage with [employer]  Initial goal documentation        The patient verbalized understanding of instructions provided today and declined a print copy of patient instruction materials.    The patient has been provided with contact information for the care management team and has been advised to call with any health related questions or concerns.   SYMPTOMS OF A STROKE   You have any symptoms of stroke. "BE FAST" is an easy way to remember the main warning signs: ? B - Balance. Signs are dizziness, sudden trouble walking, or loss of balance. ? E - Eyes. Signs are trouble seeing or a sudden change in how you see. ? F - Face. Signs are sudden weakness or loss of feeling of the face, or the face or eyelid drooping on one side. ? A - Arms. Signs are weakness or loss of feeling in an arm. This happens suddenly and usually on one side of the body. ? S - Speech. Signs are sudden trouble speaking, slurred speech, or trouble understanding what people say. ? T - Time. Time to call emergency services. Write down what time symptoms started.  You have other signs of stroke, such as: ? A sudden, very bad headache with no known cause. ? Feeling sick to your stomach (nausea). ? Throwing up (vomiting). ? Jerky movements you cannot control (seizure).  SYMPTOMS OF A HEART ATTACK  What are the signs or symptoms? Symptoms of this condition include:  Chest pain. It may feel like: ? Crushing or squeezing. ? Tightness, pressure, fullness, or heaviness.  Pain in the arm, neck, jaw, back, or upper body.  Shortness of breath.  Heartburn.  Indigestion.  Nausea.  Cold sweats.  Feeling tired.  Sudden lightheadedness.

## 2018-09-05 NOTE — Chronic Care Management (AMB) (Signed)
  Chronic Care Management   Initial Visit Note  09/05/2018 Name: Jeffery Campbell MRN: [id number] DOB: [birth year]  Subjective: "I think I'm doing real good" "That lady from [facility] keeps a check on me"  Objective:  BP Readings from Last 3 Encounters:  08/13/18 (!) 152/79  05/26/18 128/68  05/20/18 124/70   Lab Results  Component Value Date   HGBA1C 5.1 04/09/2018   HGBA1C 4.9 10/07/2017   HGBA1C 5.1 04/08/2017   Lab Results  Component Value Date   LDLCALC 71 04/09/2018   CREATININE 1.12 02/24/2018    Assessment: Jeffery Campbell is a 85 y.o. year old male who is a primary care patient of [facility]. He was referred to the CCM Team by his health plan. He has a history of but not limited to HTN, Sleep related hypoxia, CKD, and HLD. Today CCM RN CM completed an initial telephone assessment and discussed health goals.  Review of patient status, including review of consultants reports, relevant laboratory and other test results, and collaboration with appropriate care team members and the patient's provider was performed as part of comprehensive patient evaluation and provision of chronic care management services.     0 Hospitalizations/ 0 ED visits in last 6 months  Advanced Directives 08/13/2018  Does Patient Have a Medical Advance Directive? No  Type of Advance Directive -  Does patient want to make changes to medical advance directive? No - Patient declined  Copy of Healthcare Power of Attorney in Chart? -  Would patient like information on creating a medical advance directive? -     Goals Addressed            This Visit's Progress   . There is other people that are calling me from [facility] (pt-stated)       Jeffery Campbell states he is doing well managing his health. He is supported by his son and grandson. He is independent in all ADLS and AIDLS. He is taking his medications as prescribed. He tries to follow a low sodium diet, eats at home more than eats out. He is  engaged with [facility] although he is unsure who [facility] is. Today, CCM RN CM spent time explaining [facility] services and patient was grateful.   Current Barriers:  [name] Knowledge Deficits related to understanding [facility] services  Nurse Case Manager Clinical Goal(s):  [name] Over the next 30 days, patient will work with [facility]  to address needs related to chronic health management  Interventions:  . Advised patient to continue to engage with [facility] . Provided education to patient re: services [facility] provides . Reviewed medications with patient and discussed importance of medication adherence . Discussed plans with patient for ongoing care management follow up and provided patient with direct contact information for care management team  Patient Self Care Activities:  . Self administers medications as prescribed . Attends all scheduled provider appointments . Calls pharmacy for medication refills . Attends church or other social activities . Performs ADL's independently . Performs IADL's independently . Calls provider office for new concerns or questions  . Continues to Engage with [employer]  Initial goal documentation         Follow up plan:  The patient has been provided with contact information for the care management team and has been advised to call with any health related questions or concerns.      Jeffery Campbell E. Suzie Portela, RN, BSN Nurse Care Coordinator [facility] Care Management  [phone]

## 2018-09-09 ENCOUNTER — Telehealth: Payer: Self-pay | Admitting: Family Medicine

## 2018-09-09 NOTE — Addendum Note (Signed)
Addended by: Clerance Lav on: 09/09/2018 08:46 AM   Modules accepted: Level of Service, SmartSet

## 2018-09-09 NOTE — Chronic Care Management (AMB) (Signed)
Chronic Care Management  ° °Note ° °09/02/2018 °Name: Cylus Ives MRN: [id number] DOB: 09/03/1928 ° °Harrel Pawloski is a 85 y.o. year old male who is a primary care patient of Lada, Melinda P, MD. I reached out to Majed Dorow by phone today in response to a referral sent by Mr. Shloimy Holben's health plan.   ° °Mr. Spillane was given information about Chronic Care Management services today including:  °1. CCM service includes personalized support from designated clinical staff supervised by his physician, including individualized plan of care and coordination with other care providers °2. 24/7 contact phone numbers for assistance for urgent and routine care needs. °3. Service will only be billed when office clinical staff spend 20 minutes or more in a month to coordinate care. °4. Only one practitioner may furnish and bill the service in a calendar month. °5. The patient may stop CCM services at any time (effective at the end of the month) by phone call to the office staff. °6. The patient will be responsible for cost sharing (co-pay) of up to 20% of the service fee (after annual deductible is met). ° °Patient agreed to services and verbal consent obtained.  ° °Follow up plan: °Telephone appointment with CCM team member scheduled for: 09/05/2018 ° °Bernice Cicero °Care Guide • [facility] °[place]   [facility]cted [facility]  °??[email or web address]   ??[phone]   ° ° ° °

## 2018-09-09 NOTE — Progress Notes (Signed)
This encounter was created in error - please disregard.

## 2018-09-11 ENCOUNTER — Other Ambulatory Visit: Payer: Self-pay | Admitting: Family Medicine

## 2018-09-11 DIAGNOSIS — E782 Mixed hyperlipidemia: Secondary | ICD-10-CM

## 2018-09-17 DIAGNOSIS — H2511 Age-related nuclear cataract, right eye: Secondary | ICD-10-CM | POA: Diagnosis not present

## 2018-09-21 ENCOUNTER — Other Ambulatory Visit: Payer: Self-pay | Admitting: Nurse Practitioner

## 2018-09-23 NOTE — Progress Notes (Signed)
09/24/2018  9:37 AM   Jeffery Campbell 1929/03/22 [id number]  Referring provider: Kerman PasseyLada, Melinda P, MD [address] [place] [id number]  Chief Complaint  Patient presents with  . Benign Prostatic Hypertrophy    HPI: Patient is an [age] year old Caucasian male who returns today for the evaluation and management of BPH with LUTS.   BPH WITH LUTS  (prostate and/or bladder) IPSS score: 6/1    PVR: 0 mL   Previous score: 3/1   Previous PVR: 0 mL  Major complaint(s):  No complaints at this visit.  He has nocturia x 1.  This is not bothersome to him. Denies any dysuria, hematuria or suprapubic pain.   Currently taking: alfuzosin 10 mg daily and finasteride 5 mg daily.     Denies any recent fevers, chills, nausea or vomiting. IPSS    Row Name 09/24/18 0900         International Prostate Symptom Score   How often have you had the sensation of not emptying your bladder?  Less than 1 in 5     How often have you had to urinate less than every two hours?  Less than 1 in 5 times     How often have you found you stopped and started again several times when you urinated?  Less than 1 in 5 times     How often have you found it difficult to postpone urination?  Not at All     How often have you had a weak urinary stream?  Less than half the time     How often have you had to strain to start urination?  Not at All     How many times did you typically get up at night to urinate?  1 Time     Total IPSS Score  6       Quality of Life due to urinary symptoms   If you were to spend the rest of your life with your urinary condition just the way it is now how would you feel about that?  Pleased        Score:  1-7 Mild 8-19 Moderate 20-35 Severe   PMH: Past Medical History:  Diagnosis Date  . Arrhythmia   . Cough    CHRONIC  . Depression   . Disturbance of sleep   . Diverticulitis   . GERD (gastroesophageal reflux disease)   . Hemorrhoids   . Hypertension   . OA  (osteoarthritis)   . Pure hypercholesterolemia   . Stroke [facility](HCC)     Surgical History: Past Surgical History:  Procedure Laterality Date  . CATARACT EXTRACTION W/PHACO Left 02/14/2016   Procedure: CATARACT EXTRACTION PHACO AND INTRAOCULAR LENS PLACEMENT (IOC);  Surgeon: Galen ManilaWilliam Porfilio, MD;  Location: [facility] ORS;  Service: Ophthalmology;  Laterality: Left;  US 53.7 AP% 21.5 CDE 11.58 FLUID PACK LOT # [id number]  . COLONOSCOPY    . KNEE ARTHROSCOPY    . TRANSURETHRAL RESECTION OF PROSTATE      Home Medications:  Allergies as of 09/24/2018   No Known Allergies     Medication List       Accurate as of September 24, 2018  9:37 AM. If you have any questions, ask your nurse or doctor.        acetaminophen 500 MG tablet Commonly known as: TYLENOL Take 1-2 tablets (500-1,000 mg total) by mouth every 8 (eight) hours as needed for fever.   albuterol 108 (90 Base) MCG/ACT  inhaler Commonly known as: VENTOLIN HFA 2 puffs q.i.d. p.r.n. short of breath, wheezing, or cough   alfuzosin 10 MG 24 hr tablet Commonly known as: UROXATRAL Take 1 tablet (10 mg total) by mouth daily with breakfast.   atorvastatin 20 MG tablet Commonly known as: LIPITOR TAKE 1 TABLET BY MOUTH EVERYDAY AT BEDTIME   B-12 3000 MCG Subl Place 3,000 mcg under the tongue daily.   citalopram 20 MG tablet Commonly known as: CELEXA Take by mouth.   clopidogrel 75 MG tablet Commonly known as: PLAVIX TAKE 1 TABLET (75 MG TOTAL) BY MOUTH DAILY. (STOP AGGRENOX) What changed: additional instructions   dextromethorphan-guaiFENesin 30-600 MG 12hr tablet Commonly known as: MUCINEX DM Take 1 tablet by mouth 2 (two) times daily as needed for cough.   diltiazem 120 MG 24 hr capsule Commonly known as: CARDIZEM CD TAKE 1 CAPSULE BY MOUTH EVERY DAY   docusate sodium 100 MG capsule Commonly known as: Colace Take 1 capsule (100 mg total) by mouth 2 (two) times daily as needed for mild constipation.   famotidine 20 MG tablet  Commonly known as: PEPCID Take 1 tablet (20 mg total) by mouth 2 (two) times daily as needed for heartburn or indigestion.   finasteride 5 MG tablet Commonly known as: Proscar Take 1 tablet (5 mg total) by mouth daily.   fluticasone 50 MCG/ACT nasal spray Commonly known as: FLONASE Place 2 sprays into both nostrils daily.   meclizine 25 MG tablet Commonly known as: ANTIVERT TAKE 1/2 TO 1 TABLET (12.5-25 MG TOTAL) BY MOUTH 3 (THREE) TIMES DAILY AS NEEDED FOR DIZZINESS.   Omega 3 1200 MG Caps Take 1,200 mg by mouth daily.   Omega 3-6-9 Caps Take by mouth.   ranitidine 150 MG tablet Commonly known as: ZANTAC Take by mouth.   terazosin 5 MG capsule Commonly known as: HYTRIN Take by mouth.       Allergies: No Known Allergies  Family History: Family History  Problem Relation Age of Onset  . Hypertension Son        Posey ReaUnsure of parents health.  . Prostate cancer Neg Hx   . Bladder Cancer Neg Hx   . Kidney cancer Neg Hx     Social History:  reports that he has never smoked. He has never used smokeless tobacco. He reports that he does not drink alcohol or use drugs.  ROS: UROLOGY Frequent Urination?: No Hard to postpone urination?: No Burning/pain with urination?: No Get up at night to urinate?: No Leakage of urine?: No Urine stream starts and stops?: No Trouble starting stream?: No Do you have to strain to urinate?: No Blood in urine?: No Urinary tract infection?: No Sexually transmitted disease?: No Injury to kidneys or bladder?: No Painful intercourse?: No Weak stream?: No Erection problems?: No Penile pain?: No  Gastrointestinal Nausea?: No Vomiting?: No Indigestion/heartburn?: No Diarrhea?: No Constipation?: No  Constitutional Fever: No Night sweats?: No Weight loss?: No Fatigue?: No  Skin Skin rash/lesions?: No Itching?: No  Eyes Blurred vision?: No Double vision?: No  Ears/Nose/Throat Sore throat?: No Sinus problems?: No   Hematologic/Lymphatic Swollen glands?: No Easy bruising?: No  Cardiovascular Leg swelling?: No Chest pain?: No  Respiratory Cough?: No Shortness of breath?: No  Endocrine Excessive thirst?: No  Musculoskeletal Back pain?: No Joint pain?: No  Neurological Headaches?: No Dizziness?: No  Psychologic Depression?: No Anxiety?: No  Physical Exam: BP (!) 145/80 (BP Location: Left Arm, Patient Position: Sitting)   Pulse 72   Ht 6'  1" (1.854 m)   Wt 239 lb (108.4 kg)   BMI 31.53 kg/m   Constitutional:  Well nourished. Alert and oriented, No acute distress. HEENT: [place] AT, moist mucus membranes.  Trachea midline, no masses. Cardiovascular: No clubbing, cyanosis, or edema. Respiratory: Normal respiratory effort, no increased work of breathing. Neurologic: Grossly intact, no focal deficits, moving all 4 extremities. Psychiatric: Normal mood and affect.    Laboratory Data: Lab Results  Component Value Date   WBC 5.7 02/24/2018   HGB 17.1 (H) 08/13/2018   HCT 50.1 08/13/2018   MCV 90.9 02/24/2018   PLT 224 02/24/2018    Lab Results  Component Value Date   CREATININE 1.12 02/24/2018    No results found for: PSA  No results found for: TESTOSTERONE  Lab Results  Component Value Date   HGBA1C 5.1 04/09/2018    Lab Results  Component Value Date   TSH 2.090 12/29/2014       Component Value Date/Time   CHOL 131 04/09/2018 0820   CHOL 134 09/22/2014 0810   HDL 41 04/09/2018 0820   HDL 46 09/22/2014 0810   CHOLHDL 3.2 04/09/2018 0820   VLDL 9 10/12/2016 0813   LDLCALC 71 04/09/2018 0820    Lab Results  Component Value Date   AST 17 02/24/2018   Lab Results  Component Value Date   ALT 14 02/24/2018   No components found for: ALKALINEPHOPHATASE No components found for: BILIRUBINTOTAL  No results found for: ESTRADIOL I have reviewed the labs.  Pertinent Imaging Results for orders placed or performed in visit on 09/24/18  BLADDER SCAN AMB  NON-IMAGING  Result Value Ref Range   Scan Result 88ml      Assessment & Plan:    1. BPH with LU TS I PSS score 6/1 Continue conservative management, avoiding bladder irritants and timed voiding's Continue alfuzosin 10 mg and finsateride 5 mg  Follow-up in 12 months for I PSS and PVR  Return in about 1 year (around 09/24/2019) for IPSS and PVR.  These notes generated with voice recognition software. I apologize for typographical errors.  Zara Council, PA-C  [facility] [address]  [place], [place] [address] [phone]

## 2018-09-24 ENCOUNTER — Encounter: Payer: Self-pay | Admitting: Urology

## 2018-09-24 ENCOUNTER — Ambulatory Visit (INDEPENDENT_AMBULATORY_CARE_PROVIDER_SITE_OTHER): Payer: PPO | Admitting: Urology

## 2018-09-24 ENCOUNTER — Other Ambulatory Visit: Payer: Self-pay

## 2018-09-24 VITALS — BP 145/80 | HR 72 | Ht 73.0 in | Wt 239.0 lb

## 2018-09-24 DIAGNOSIS — N401 Enlarged prostate with lower urinary tract symptoms: Secondary | ICD-10-CM

## 2018-09-24 DIAGNOSIS — N138 Other obstructive and reflux uropathy: Secondary | ICD-10-CM

## 2018-09-24 LAB — BLADDER SCAN AMB NON-IMAGING

## 2018-09-24 MED ORDER — ALFUZOSIN HCL ER 10 MG PO TB24: 10.0000 mg | ORAL_TABLET | Freq: Every day | ORAL | 3 refills | Status: DC

## 2018-09-24 MED ORDER — FINASTERIDE 5 MG PO TABS: 5.0000 mg | ORAL_TABLET | Freq: Every day | ORAL | 3 refills | Status: DC

## 2018-09-28 DIAGNOSIS — G4736 Sleep related hypoventilation in conditions classified elsewhere: Secondary | ICD-10-CM | POA: Diagnosis not present

## 2018-09-28 DIAGNOSIS — D751 Secondary polycythemia: Secondary | ICD-10-CM | POA: Diagnosis not present

## 2018-09-28 DIAGNOSIS — G4733 Obstructive sleep apnea (adult) (pediatric): Secondary | ICD-10-CM | POA: Diagnosis not present

## 2018-10-08 ENCOUNTER — Ambulatory Visit (INDEPENDENT_AMBULATORY_CARE_PROVIDER_SITE_OTHER): Payer: PPO | Admitting: Family Medicine

## 2018-10-08 ENCOUNTER — Other Ambulatory Visit: Payer: Self-pay

## 2018-10-08 ENCOUNTER — Encounter: Payer: Self-pay | Admitting: Family Medicine

## 2018-10-08 ENCOUNTER — Ambulatory Visit: Payer: PPO | Admitting: Family Medicine

## 2018-10-08 DIAGNOSIS — I493 Ventricular premature depolarization: Secondary | ICD-10-CM | POA: Diagnosis not present

## 2018-10-08 DIAGNOSIS — I10 Essential (primary) hypertension: Secondary | ICD-10-CM

## 2018-10-08 DIAGNOSIS — E6609 Other obesity due to excess calories: Secondary | ICD-10-CM | POA: Diagnosis not present

## 2018-10-08 DIAGNOSIS — Z8673 Personal history of transient ischemic attack (TIA), and cerebral infarction without residual deficits: Secondary | ICD-10-CM

## 2018-10-08 DIAGNOSIS — N183 Chronic kidney disease, stage 3 unspecified: Secondary | ICD-10-CM

## 2018-10-08 DIAGNOSIS — K5900 Constipation, unspecified: Secondary | ICD-10-CM

## 2018-10-08 DIAGNOSIS — N400 Enlarged prostate without lower urinary tract symptoms: Secondary | ICD-10-CM | POA: Diagnosis not present

## 2018-10-08 DIAGNOSIS — F3341 Major depressive disorder, recurrent, in partial remission: Secondary | ICD-10-CM | POA: Insufficient documentation

## 2018-10-08 DIAGNOSIS — E782 Mixed hyperlipidemia: Secondary | ICD-10-CM

## 2018-10-08 DIAGNOSIS — M159 Polyosteoarthritis, unspecified: Secondary | ICD-10-CM

## 2018-10-08 DIAGNOSIS — R7303 Prediabetes: Secondary | ICD-10-CM

## 2018-10-08 DIAGNOSIS — Z6832 Body mass index (BMI) 32.0-32.9, adult: Secondary | ICD-10-CM

## 2018-10-08 DIAGNOSIS — K219 Gastro-esophageal reflux disease without esophagitis: Secondary | ICD-10-CM | POA: Diagnosis not present

## 2018-10-08 DIAGNOSIS — E8881 Metabolic syndrome: Secondary | ICD-10-CM

## 2018-10-08 MED ORDER — POLYETHYLENE GLYCOL 3350 17 GM/SCOOP PO POWD: 17.0000 g | Freq: Once | ORAL | 1 refills | Status: AC

## 2018-10-08 NOTE — Progress Notes (Signed)
Name: Jeffery Campbell   MRN: [id number]    DOB: Apr 03, 1928   Date:10/08/2018       Progress Note  Subjective  Chief Complaint  Chief Complaint  Patient presents with  . Follow-up    6 month    I connected with  Jeffery Campbell on 10/08/18 at 10:00 AM EDT by telephone and verified that I am speaking with the correct person using two identifiers.  I discussed the limitations, risks, security and privacy concerns of performing an evaluation and management service by telephone and the availability of in person appointments. Staff also discussed with the patient that there may be a patient responsible charge related to this service. Patient Location: Home Provider Location: Home Additional Individuals present: None  HPI  BPH/CKD: She has been seeing Michiel CowboyShannon [name] PA-C; having nocturia but it is tolerable - up 0-1 times.  Taking medications as prescribed.  Taking proscar 5mg , and alfuzosin 10mg .  Last CMP was November 2019, however, due to COVID-19 pandemic and his advanced age, we will avoid rechecking labs for today's visit.  I prefer to keep him at home rather than risk exposure.   HTN/PVC's: Taking diltiazem 120mg .  Denies chest pain, shortness of breath, BLE edema, headaches.  Not checking BP's at home. No palpitations.  HLD: Last labs were at goal.  Taking atorvastatin without myalgias, chest pain, or shortness of breath.  Prediabetes/Obesity: No polyuria, polyphagia, or polydipsia. LAst A1C in January 2020 was 5.1% - has consistently been below prediabetic range for over a year now.   Constipation: Taking MOM PRN - maybe 1-2 times a week.  He has been taking miralax in the past and would like to try this again.  No blood in stool, dark and tarry stools, or abdominal pain.   Depression: States is doing well, going out and walking or spending time outdoors.  Mood has been stable; no SI/HI.  Taking Celexa without issues.   [facility]  PHQ-9  Total Score  0     HX stroke: States no recent symptoms; difficult to assess as he is hard of hearing and it is a telephone encounter, but he denies any concerns and verifies that he is taking his plavix daily.  GERD: Denies difficulty swallowing, abdominal pain, or heartburn.  Taking pepcid and avoiding triggers.  OA: He denies any pain for many months - states he is doing really well with his mobility.   Patient Active Problem List   Diagnosis Date Noted  . Erythrocytosis 01/31/2018  . Hx of completed stroke 01/10/2018  . Prediabetes 10/07/2017  . Dizziness 07/03/2017  . Abnormal gait 10/05/2016  . Hypoxia, sleep related 04/20/2016  . Elevated hematocrit 04/04/2016  . Medication monitoring encounter 03/30/2016  . Right flank pain 01/10/2016  . General unsteadiness 05/24/2015  . Allergic rhinitis due to pollen 11/12/2014  . Back pain, chronic 10/21/2014  . Benign paroxysmal positional nystagmus 10/21/2014  . Decreased libido 10/21/2014  . Fatigue 10/21/2014  . Gastro-esophageal reflux disease without esophagitis 10/21/2014  . Dysmetabolic syndrome 10/21/2014  . Fungal infection of nail 10/21/2014  . HLD (hyperlipidemia) 08/19/2014  . CKD (chronic kidney disease) stage 3, GFR 30-59 ml/min (HCC) 08/19/2014  . Obesity   . Asymptomatic PVCs 05/27/2014  . Essential hypertension 04/05/2014  . Osteoarthrosis involving more than one site but not generalized 08/03/2008    Past Surgical History:  Procedure Laterality Date  . CATARACT EXTRACTION W/PHACO Left 02/14/2016   Procedure: CATARACT  EXTRACTION PHACO AND INTRAOCULAR LENS PLACEMENT (IOC);  Surgeon: Galen ManilaWilliam Porfilio, MD;  Location: [facility] ORS;  Service: Ophthalmology;  Laterality: Left;  US 53.7 AP% 21.5 CDE 11.58 FLUID PACK LOT # [id number]  . COLONOSCOPY    . KNEE ARTHROSCOPY    . TRANSURETHRAL RESECTION OF PROSTATE      Family History  Problem Relation Age of Onset  . Hypertension Son        Posey ReaUnsure of parents health.   . Prostate cancer Neg Hx   . Bladder Cancer Neg Hx   . Kidney cancer Neg Hx     Social History   Socioeconomic History  . Marital status: Divorced    Spouse name: Not on file  . Number of children: 3  . Years of education: Not on file  . Highest education level: 11th grade  Occupational History  . Occupation: Retired  Engineer, productionocial Needs  . Financial resource strain: Not hard at all  . Food insecurity    Worry: Never true    Inability: Never true  . Transportation needs    Medical: No    Non-medical: No  Tobacco Use  . Smoking status: Never Smoker  . Smokeless tobacco: Never Used  . Tobacco comment: smoking cessation materials not required  Substance and Sexual Activity  . Alcohol use: No    Alcohol/week: 0.0 standard drinks  . Drug use: No  . Sexual activity: Not Currently  Lifestyle  . Physical activity    Days per week: 3 days    Minutes per session: 60 min  . Stress: Not at all  Relationships  . Social Musicianconnections    Talks on phone: Three times a week    Gets together: Twice a week    Attends religious service: More than 4 times per year    Active member of club or organization: No    Attends meetings of clubs or organizations: Never    Relationship status: Divorced  . Intimate partner violence    Fear of current or ex partner: No    Emotionally abused: No    Physically abused: No    Forced sexual activity: No  Other Topics Concern  . Not on file  Social History Narrative   Lives at home alone.   Writes left-handed - does everything else with right-hand.   No caffeine use.      Never smoker; never alcohol; used to be truck driver; works part time in [employer]. 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      Current Outpatient Medications:  .  albuterol (VENTOLIN HFA) 108 (90 Base) MCG/ACT inhaler, 2 puffs q.i.d. p.r.n. short of breath, wheezing, or cough, Disp: , Rfl:  .  alfuzosin (UROXATRAL) 10 MG 24 hr tablet, Take 1 tablet (10 mg total) by mouth daily with breakfast.,  Disp: 90 tablet, Rfl: 3 .  atorvastatin (LIPITOR) 20 MG tablet, TAKE 1 TABLET BY MOUTH EVERYDAY AT BEDTIME, Disp: 90 tablet, Rfl: 1 .  citalopram (CELEXA) 20 MG tablet, Take by mouth., Disp: , Rfl:  .  clopidogrel (PLAVIX) 75 MG tablet, TAKE 1 TABLET (75 MG TOTAL) BY MOUTH DAILY. (STOP AGGRENOX) (Patient taking differently: Take 75 mg by mouth daily. ), Disp: 90 tablet, Rfl: 3 .  Cyanocobalamin (B-12) 3000 MCG SUBL, Place 3,000 mcg under the tongue daily. , Disp: 30 tablet, Rfl: 11 .  diltiazem (CARDIZEM CD) 120 MG 24 hr capsule, TAKE 1 CAPSULE BY MOUTH EVERY DAY, Disp: 90 capsule, Rfl: 1 .  famotidine (PEPCID) 20 MG  tablet, Take 1 tablet (20 mg total) by mouth 2 (two) times daily as needed for heartburn or indigestion., Disp: 180 tablet, Rfl: 1 .  finasteride (PROSCAR) 5 MG tablet, Take 1 tablet (5 mg total) by mouth daily., Disp: 90 tablet, Rfl: 3 .  meclizine (ANTIVERT) 25 MG tablet, TAKE 1/2 TO 1 TABLET (12.5-25 MG TOTAL) BY MOUTH 3 (THREE) TIMES DAILY AS NEEDED FOR DIZZINESS., Disp: 30 tablet, Rfl: 2 .  Omega 3 1200 MG CAPS, Take 1,200 mg by mouth daily. , Disp: , Rfl:  .  Omega 3-6-9 CAPS, Take by mouth., Disp: , Rfl:  .  ranitidine (ZANTAC) 150 MG tablet, Take by mouth., Disp: , Rfl:  .  terazosin (HYTRIN) 5 MG capsule, Take by mouth., Disp: , Rfl:  .  acetaminophen (TYLENOL) 500 MG tablet, Take 1-2 tablets (500-1,000 mg total) by mouth every 8 (eight) hours as needed for fever. (Patient not taking: Reported on 09/05/2018), Disp: 30 tablet, Rfl: 0 .  dextromethorphan-guaiFENesin (MUCINEX DM) 30-600 MG 12hr tablet, Take 1 tablet by mouth 2 (two) times daily as needed for cough. (Patient not taking: Reported on 09/05/2018), Disp: 30 tablet, Rfl: 0 .  docusate sodium (COLACE) 100 MG capsule, Take 1 capsule (100 mg total) by mouth 2 (two) times daily as needed for mild constipation. (Patient not taking: Reported on 09/05/2018), Disp: 14 capsule, Rfl: 0 .  fluticasone (FLONASE) 50 MCG/ACT nasal spray,  Place 2 sprays into both nostrils daily. (Patient not taking: Reported on 09/05/2018), Disp: 16 g, Rfl: 11  No Known Allergies  I personally reviewed active problem list, medication list, allergies, notes from last encounter, lab results with the patient/caregiver today.   ROS Constitutional: Negative for fever or weight change.  Respiratory: Negative for cough and shortness of breath.   Cardiovascular: Negative for chest pain or palpitations.  Gastrointestinal: Negative for abdominal pain, +constipation  Musculoskeletal: Negative for gait problem or joint swelling.  Skin: Negative for rash.  Neurological: Negative for dizziness or headache.  No other specific complaints in a complete review of systems (except as listed in HPI above).  Objective  Virtual encounter, vitals not obtained.  There is no height or weight on file to calculate BMI.  Physical Exam  Pulmonary/Chest: Effort normal. No respiratory distress. Speaking in complete sentences Neurological: Pt is alert and oriented to person, place, and time. Speech is normal. Psychiatric: Patient has a normal mood and affect. behavior is normal. Judgment and thought content normal.  No results found for this or any previous visit (from the past 72 hour(s)).  PHQ2/9: Depression screen [facility] 2/9 10/08/2018 09/05/2018 07/21/2018 07/07/2018 07/07/2018  Decreased Interest 0 0 0 0 0  Down, Depressed, Hopeless 0 0 0 0 0  PHQ - 2 Score 0 0 0 0 0  Altered sleeping 0 - 0 0 0  Tired, decreased energy 0 - 0 0 0  Change in appetite 0 - 0 0 0  Feeling bad or failure about yourself  0 - 0 0 0  Trouble concentrating 0 - 0 0 0  Moving slowly or fidgety/restless 0 - 0 0 0  Suicidal thoughts 0 - 0 0 0  PHQ-9 Score 0 - 0 0 0  Difficult doing work/chores Not difficult at all - Not difficult at all Not difficult at all Not difficult at all  Some recent data might be hidden   PHQ-2/9 Result is negative.    Fall Risk: Fall Risk  10/08/2018 09/05/2018  07/21/2018 07/07/2018 05/26/2018  Falls  in the past year? 0 0 0 0 0  Number falls in past yr: 0 - 0 0 0  Injury with Fall? 0 - 0 0 0  Risk for fall due to : - - - - -  Risk for fall due to: Comment - - - - -  Follow up Falls evaluation completed - - - -    Assessment & Plan  1. Benign prostatic hyperplasia, unspecified whether lower urinary tract symptoms present -Seeing urology  2. CKD (chronic kidney disease) stage 3, GFR 30-59 ml/min (HCC) - Needs labs at next follow up; doing well.  3. Essential hypertension - Taking medications as prescribed; asymptomatic, unable to check BP at home.  4. Asymptomatic PVCs - No symptoms, UTA heart rate today  5. Mixed hyperlipidemia - Taking statin therapy, though he is 85yo, he is tolerating the medication and has hx stroke, so we will continue.  6. Prediabetes - Asymptomatic, will check at next visit.  7. Constipation, unspecified constipation type - Miralax daily per orders, increase fiber and fluids  8. Recurrent major depressive disorder, in partial remission (HCC) - Celexa, doing well  9. Hx of completed stroke stable  10. Gastro-esophageal reflux disease without esophagitis - Stable on pepcid.  11. Osteoarthrosis involving more than one site but not generalized - Stable, no concerns  12. Class 1 obesity due to excess calories with serious comorbidity and body mass index (BMI) of 32.0 to 32.9 in adult - Discussed importance of 150 minutes of physical activity weekly, eat two servings of fish weekly, eat one serving of tree nuts ( cashews, pistachios, pecans, almonds.[name].) every other day, eat 6 servings of fruit/vegetables daily and drink plenty of water and avoid sweet beverages.   13. Dysmetabolic syndrome - Needs A1C and lipids at next visit.   I discussed the assessment and treatment plan with the patient. The patient was provided an opportunity to ask questions and all were answered. The patient agreed with the plan and  demonstrated an understanding of the instructions.   The patient was advised to call back or seek an in-person evaluation if the symptoms worsen or if the condition fails to improve as anticipated.  I provided 23 minutes of non-face-to-face time during this encounter.  Doren CustardEmily E Randell Detter, [name]

## 2018-10-27 DIAGNOSIS — D751 Secondary polycythemia: Secondary | ICD-10-CM | POA: Diagnosis not present

## 2018-10-27 DIAGNOSIS — G4733 Obstructive sleep apnea (adult) (pediatric): Secondary | ICD-10-CM | POA: Diagnosis not present

## 2018-10-27 DIAGNOSIS — G4736 Sleep related hypoventilation in conditions classified elsewhere: Secondary | ICD-10-CM | POA: Diagnosis not present

## 2018-10-28 ENCOUNTER — Ambulatory Visit (INDEPENDENT_AMBULATORY_CARE_PROVIDER_SITE_OTHER): Payer: PPO

## 2018-10-28 VITALS — Ht 73.0 in | Wt 239.0 lb

## 2018-10-28 DIAGNOSIS — Z0000 Encounter for general adult medical examination without abnormal findings: Secondary | ICD-10-CM | POA: Diagnosis not present

## 2018-10-28 NOTE — Progress Notes (Signed)
Subjective:   Jeffery Campbell is a 85 y.o. male who presents for Medicare Annual/Subsequent preventive examination.  Virtual Visit via Telephone Note  I connected with Jeffery BrittleJoe Steines on 10/28/18 at  3:00 PM EDT by telephone and verified that I am speaking with the correct person using two identifiers.  Medicare Annual Wellness Visit completed telephonically due to Covid-19 pandemic.   Location: Patient: home Provider: office   I discussed the limitations, risks, security and privacy concerns of performing an evaluation and management service by telephone and the availability of in person appointments. The patient expressed understanding and agreed to proceed.  Some vital signs may be absent or patient reported. Pt does not have blood pressure monitor at home.   Reather LittlerKasey Deserai Cansler, LPN     Review of Systems:   Cardiac Risk Factors include: advanced age (>4255men, 33>65 women);dyslipidemia;hypertension;male gender;obesity (BMI >30kg/m2)     Objective:    Vitals: Ht 6\' 1"  (1.854 m)   Wt 239 lb (108.4 kg)   BMI 31.53 kg/m   Body mass index is 31.53 kg/m.  Advanced Directives 10/28/2018 08/13/2018 02/24/2018 01/31/2018 01/27/2018 10/24/2017 03/21/2017  Does Patient Have a Medical Advance Directive? Yes No Yes Yes No Yes Yes  Type of Estate agentAdvance Directive Healthcare Power of [place];Living will - Living will Living will - Healthcare Power of [place];Living will Healthcare Power of [place];Living will  Does patient want to make changes to medical advance directive? - No - Patient declined - - - - -  Copy of Healthcare Power of Attorney in Chart? No - copy requested - - - - No - copy requested No - copy requested  Would patient like information on creating a medical advance directive? - - - - No - Patient declined - No - Patient declined    Tobacco Social History   Tobacco Use  Smoking Status Never Smoker  Smokeless Tobacco Never Used  Tobacco Comment   smoking cessation materials not required     Counseling given: Not Answered Comment: smoking cessation materials not required   Clinical Intake:  Pre-visit preparation completed: Yes  Pain : No/denies pain     BMI - recorded: 31.53 Nutritional Status: BMI > 30  Obese Nutritional Risks: None Diabetes: No  How often do you need to have someone help you when you read instructions, pamphlets, or other written materials from your doctor or pharmacy?: 1 - Never  Interpreter Needed?: No  Information entered by :: Reather LittlerKasey Adger Cantera LPN  Past Medical History:  Diagnosis Date  . Arrhythmia   . Cough    CHRONIC  . Depression   . Disturbance of sleep   . Diverticulitis   . GERD (gastroesophageal reflux disease)   . Hemorrhoids   . Hypertension   . OA (osteoarthritis)   . Pure hypercholesterolemia   . Stroke [facility](HCC)    Past Surgical History:  Procedure Laterality Date  . CATARACT EXTRACTION W/PHACO Left 02/14/2016   Procedure: CATARACT EXTRACTION PHACO AND INTRAOCULAR LENS PLACEMENT (IOC);  Surgeon: Galen ManilaWilliam Porfilio, MD;  Location: [facility] ORS;  Service: Ophthalmology;  Laterality: Left;  US 53.7 AP% 21.5 CDE 11.58 FLUID PACK LOT # [id number]  . COLONOSCOPY    . KNEE ARTHROSCOPY    . TRANSURETHRAL RESECTION OF PROSTATE     Family History  Problem Relation Age of Onset  . Hypertension Son        Posey ReaUnsure of parents health.  . Prostate cancer Neg Hx   . Bladder Cancer Neg Hx   . Kidney  cancer Neg Hx    Social History   Socioeconomic History  . Marital status: Divorced    Spouse name: Not on file  . Number of children: 3  . Years of education: Not on file  . Highest education level: 11th grade  Occupational History  . Occupation: Retired  Engineer, productionocial Needs  . Financial resource strain: Not hard at all  . Food insecurity    Worry: Never true    Inability: Never true  . Transportation needs    Medical: No    Non-medical: No  Tobacco Use  . Smoking status: Never Smoker  . Smokeless tobacco: Never Used  . Tobacco  comment: smoking cessation materials not required  Substance and Sexual Activity  . Alcohol use: No    Alcohol/week: 0.0 standard drinks  . Drug use: No  . Sexual activity: Not Currently  Lifestyle  . Physical activity    Days per week: 3 days    Minutes per session: 60 min  . Stress: Not at all  Relationships  . Social Musicianconnections    Talks on phone: Three times a week    Gets together: Twice a week    Attends religious service: More than 4 times per year    Active member of club or organization: No    Attends meetings of clubs or organizations: Never    Relationship status: Divorced  Other Topics Concern  . Not on file  Social History Narrative   Lives at home alone.   Writes left-handed - does everything else with right-hand.   No caffeine use.      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     Outpatient Encounter Medications as of 10/28/2018  Medication Sig  . alfuzosin (UROXATRAL) 10 MG 24 hr tablet Take 1 tablet (10 mg total) by mouth daily with breakfast.  . atorvastatin (LIPITOR) 20 MG tablet TAKE 1 TABLET BY MOUTH EVERYDAY AT BEDTIME  . clopidogrel (PLAVIX) 75 MG tablet TAKE 1 TABLET (75 MG TOTAL) BY MOUTH DAILY. (STOP AGGRENOX) (Patient taking differently: Take 75 mg by mouth daily. )  . Cyanocobalamin (B-12) 3000 MCG SUBL Place 3,000 mcg under the tongue daily.   [name]. diltiazem (CARDIZEM CD) 120 MG 24 hr capsule TAKE 1 CAPSULE BY MOUTH EVERY DAY  . docusate sodium (COLACE) 100 MG capsule Take 1 capsule (100 mg total) by mouth 2 (two) times daily as needed for mild constipation.  . famotidine (PEPCID) 20 MG tablet Take 1 tablet (20 mg total) by mouth 2 (two) times daily as needed for heartburn or indigestion.  . finasteride (PROSCAR) 5 MG tablet Take 1 tablet (5 mg total) by mouth daily.  . fluticasone (FLONASE) 50 MCG/ACT nasal spray Place 2 sprays into both nostrils daily.  . meclizine (ANTIVERT) 25  MG tablet TAKE 1/2 TO 1 TABLET (12.5-25 MG TOTAL) BY MOUTH 3 (THREE) TIMES DAILY AS NEEDED FOR DIZZINESS.  . Omega 3 1200 MG CAPS Take 1,200 mg by mouth daily.   . [DISCONTINUED] acetaminophen (TYLENOL) 500 MG tablet Take 1-2 tablets (500-1,000 mg total) by mouth every 8 (eight) hours as needed for fever. (Patient not taking: Reported on 09/05/2018)  . [DISCONTINUED] albuterol (VENTOLIN HFA) 108 (90 Base) MCG/ACT inhaler 2 puffs q.i.d. p.r.n. short of breath, wheezing, or cough  . [DISCONTINUED] citalopram (CELEXA) 20 MG tablet Take by mouth.  . [DISCONTINUED] dextromethorphan-guaiFENesin (MUCINEX DM) 30-600 MG 12hr tablet Take 1 tablet by  mouth 2 (two) times daily as needed for cough. (Patient not taking: Reported on 09/05/2018)  . [DISCONTINUED] Omega 3-6-9 CAPS Take by mouth.  . [DISCONTINUED] terazosin (HYTRIN) 5 MG capsule Take by mouth.   No facility-administered encounter medications on file as of 10/28/2018.     Activities of Daily Living In your present state of health, do you have any difficulty performing the following activities: 10/28/2018 09/05/2018  Hearing? N N  Comment declines hearing aids -  Vision? N N  Comment wears glasses -  Difficulty concentrating or making decisions? N N  Walking or climbing stairs? N N  Dressing or bathing? N N  Doing errands, shopping? N N  Preparing Food and eating ? N N  Using the Toilet? N N  In the past six months, have you accidently leaked urine? Y N  Do you have problems with loss of bowel control? N N  Managing your Medications? N N  Managing your Finances? N N  Housekeeping or managing your Housekeeping? N N  Some recent data might be hidden    Patient Care Team: Lada, Janit Bern, MD as PCP - General (Family Medicine) Garen Grams, MD as Consulting Physician (Cardiology) Oscar La, RN as Case Manager   Assessment:   This is a routine wellness examination for [place].  Exercise Activities and Dietary recommendations  Current Exercise Habits: Home exercise routine, Type of exercise: walking, Time (Minutes): 60, Frequency (Times/Week): 3, Weekly Exercise (Minutes/Week): 180, Intensity: Mild, Exercise limited by: None identified  Goals    . DIET - INCREASE WATER INTAKE     Recommend to drink at least 6-8 8oz glasses of water per day.    . There is other people that are calling me from [facility] (pt-stated)     Current Barriers:  [name] Knowledge Deficits related to understanding [facility] services  Nurse Case Manager Clinical Goal(s):  [name] Over the next 30 days, patient will work with [facility]  to address needs related to chronic health management  Interventions:  . Advised patient to continue to engage with [facility] . Provided education to patient re: services [facility] provides . Reviewed medications with patient and discussed importance of medication adherence . Discussed plans with patient for ongoing care management follow up and provided patient with direct contact information for care management team  Patient Self Care Activities:  . Self administers medications as prescribed . Attends all scheduled provider appointments . Calls pharmacy for medication refills . Attends church or other social activities . Performs ADL's independently . Performs IADL's independently . Calls provider office for new concerns or questions  . Continues to Engage with [facility]  Initial goal documentation        Fall Risk Fall Risk  10/28/2018 10/08/2018 09/05/2018 07/21/2018 07/07/2018  Falls in the past year? 0 0 0 0 0  Number falls in past yr: 0 0 - 0 0  Injury with Fall? 0 0 - 0 0  Risk for fall due to : - - - - -  Risk for fall due to: Comment - - - - -  Follow up Falls prevention discussed Falls evaluation completed - - -   FALL RISK PREVENTION PERTAINING TO THE HOME:  Any stairs in or around the home? Yes  If so, do they handrails? Yes   Home free of loose throw rugs in walkways, pet beds, electrical  cords, etc? Yes  Adequate lighting in your home to reduce risk of falls? Yes   ASSISTIVE  DEVICES UTILIZED TO PREVENT FALLS:  Life alert? No  Use of a cane, walker or w/c? No  Grab bars in the bathroom? Yes  Shower chair or bench in shower? Yes  Elevated toilet seat or a handicapped toilet? Yes   DME ORDERS:  DME order needed?  No   TIMED UP AND GO:  Was the test performed? No . Telephonic visit.   Education: Fall risk prevention has been discussed.  Intervention(s) required? No    Depression Screen PHQ 2/9 Scores 10/28/2018 10/08/2018 09/05/2018 07/21/2018  PHQ - 2 Score 1 0 0 0  PHQ- 9 Score 1 0 - 0    Cognitive Function MMSE - Mini Mental State Exam 10/26/2014  Orientation to time 4  Orientation to Place 4  Registration 3  Attention/ Calculation 3  Recall 1  Language- name 2 objects 2  Language- repeat 1  Language- follow 3 step command 3  Language- read & follow direction 1  Write a sentence 1  Copy design 1  Total score 24     6CIT Screen 10/28/2018 10/24/2017  What Year? 0 points 0 points  What month? 0 points 0 points  What time? 0 points 3 points  Count back from 20 0 points 0 points  Months in reverse 2 points 4 points  Repeat phrase 6 points 2 points  Total Score 8 9    Immunization History  Administered Date(s) Administered  . Influenza Split 02/17/2006, 12/30/2006, 01/20/2010  . Influenza, High Dose Seasonal PF 12/29/2014, 12/24/2016, 11/29/2017  . Influenza, Seasonal, Injecte, Preservative Fre 01/24/2011, 01/02/2012, 12/24/2012  . Influenza,inj,Quad PF,6+ Mos 12/09/2013  . Influenza-Unspecified 11/28/2015, 12/24/2016, 11/14/2017  . Pneumococcal Conjugate-13 01/25/2014, 03/17/2014  . Pneumococcal Polysaccharide-23 09/23/2006, 12/30/2006  . Td 02/26/2005  . Zoster 01/24/2011    Qualifies for Shingles Vaccine? Yes  Zostavax completed 2012. Due for Shingrix. Education has been provided regarding the importance of this vaccine. Pt has been advised to  call insurance company to determine out of pocket expense. Advised may also receive vaccine at local pharmacy or Health Dept. Verbalized acceptance and understanding.  Tdap: Although this vaccine is not a covered service during a Wellness Exam, does the patient still wish to receive this vaccine today?  No .  Education has been provided regarding the importance of this vaccine. Advised may receive this vaccine at local pharmacy or Health Dept. Aware to provide a copy of the vaccination record if obtained from local pharmacy or Health Dept. Verbalized acceptance and understanding.  Flu Vaccine: Up to date  Pneumococcal Vaccine: Up to date   Screening Tests Health Maintenance  Topic Date Due  . TETANUS/TDAP  05/21/2019 (Originally 02/27/2015)  . INFLUENZA VACCINE  11/01/2018  . PNA vac Low Risk Adult  Completed   Cancer Screenings:  Colorectal Screening: No longer required.   Lung Cancer Screening: (Low Dose CT Chest recommended if Age 14-80 years, 30 pack-year currently smoking OR have quit w/in 15years.) does not qualify.   Additional Screening:  Hepatitis C Screening: no longer required  Vision Screening: Recommended annual ophthalmology exams for early detection of glaucoma and other disorders of the eye. Is the patient up to date with their annual eye exam?  Yes  Who is the provider or what is the name of the office in which the pt attends annual eye exams? [facility]   Dental Screening: Recommended annual dental exams for proper oral hygiene  Community Resource Referral:  CRR required this visit?  No  Plan:    I have personally reviewed and addressed the Medicare Annual Wellness questionnaire and have noted the following in the patient's chart:  A. Medical and social history B. Use of alcohol, tobacco or illicit drugs  C. Current medications and supplements D. Functional ability and status E.  Nutritional status F.  Physical activity G. Advance  directives H. List of other physicians I.  Hospitalizations, surgeries, and ER visits in previous 12 months J.  Bode such as hearing and vision if needed, cognitive and depression L. Referrals and appointments   In addition, I have reviewed and discussed with patient certain preventive protocols, quality metrics, and best practice recommendations. A written personalized care plan for preventive services as well as general preventive health recommendations were provided to patient.   Signed,  Clemetine Marker, LPN Nurse Health Advisor   Nurse Notes: pt doing well and appreciative of visit today

## 2018-10-28 NOTE — Patient Instructions (Signed)
Jeffery Campbell , Thank you for taking time to come for your Medicare Wellness Visit. I appreciate your ongoing commitment to your health goals. Please review the following plan we discussed and let me know if I can assist you in the future.   Screening recommendations/referrals: Colonoscopy: no longer required Recommended yearly ophthalmology/optometry visit for glaucoma screening and checkup Recommended yearly dental visit for hygiene and checkup  Vaccinations: Influenza vaccine: done 11/29/17 Pneumococcal vaccine: done 03/17/14 Tdap vaccine: done 2006 Shingles vaccine: Shingrix discussed. Please contact your pharmacy for coverage information.   Advanced directives: Please bring a copy of your health care power of attorney and living will to the office at your convenience.  Conditions/risks identified: Keep up the great work!  Next appointment: Please follow up in one year for your Medicare Annual Wellness visit.    Preventive Care 8 Years and Older, Male Preventive care refers to lifestyle choices and visits with your health care provider that can promote health and wellness. What does preventive care include?  A yearly physical exam. This is also called an annual well check.  Dental exams once or twice a year.  Routine eye exams. Ask your health care provider how often you should have your eyes checked.  Personal lifestyle choices, including:  Daily care of your teeth and gums.  Regular physical activity.  Eating a healthy diet.  Avoiding tobacco and drug use.  Limiting alcohol use.  Practicing safe sex.  Taking low doses of aspirin every day.  Taking vitamin and mineral supplements as recommended by your health care provider. What happens during an annual well check? The services and screenings done by your health care provider during your annual well check will depend on your age, overall health, lifestyle risk factors, and family history of disease. Counseling   Your health care provider may ask you questions about your:  Alcohol use.  Tobacco use.  Drug use.  Emotional well-being.  Home and relationship well-being.  Sexual activity.  Eating habits.  History of falls.  Memory and ability to understand (cognition).  Work and work Statistician. Screening  You may have the following tests or measurements:  Height, weight, and BMI.  Blood pressure.  Lipid and cholesterol levels. These may be checked every 5 years, or more frequently if you are over 58 years old.  Skin check.  Lung cancer screening. You may have this screening every year starting at age 54 if you have a 30-pack-year history of smoking and currently smoke or have quit within the past 15 years.  Fecal occult blood test (FOBT) of the stool. You may have this test every year starting at age 5.  Flexible sigmoidoscopy or colonoscopy. You may have a sigmoidoscopy every 5 years or a colonoscopy every 10 years starting at age 60.  Prostate cancer screening. Recommendations will vary depending on your family history and other risks.  Hepatitis C blood test.  Hepatitis B blood test.  Sexually transmitted disease (STD) testing.  Diabetes screening. This is done by checking your blood sugar (glucose) after you have not eaten for a while (fasting). You may have this done every 1-3 years.  Abdominal aortic aneurysm (AAA) screening. You may need this if you are a current or former smoker.  Osteoporosis. You may be screened starting at age 35 if you are at high risk. Talk with your health care provider about your test results, treatment options, and if necessary, the need for more tests. Vaccines  Your health care provider may recommend  certain vaccines, such as:  Influenza vaccine. This is recommended every year.  Tetanus, diphtheria, and acellular pertussis (Tdap, Td) vaccine. You may need a Td booster every 10 years.  Zoster vaccine. You may need this after age 61.   Pneumococcal 13-valent conjugate (PCV13) vaccine. One dose is recommended after age 14.  Pneumococcal polysaccharide (PPSV23) vaccine. One dose is recommended after age 75. Talk to your health care provider about which screenings and vaccines you need and how often you need them. This information is not intended to replace advice given to you by your health care provider. Make sure you discuss any questions you have with your health care provider. Document Released: 04/15/2015 Document Revised: 12/07/2015 Document Reviewed: 01/18/2015 Elsevier Interactive Patient Education  2017 [place] Prevention in the Home Falls can cause injuries. They can happen to people of all ages. There are many things you can do to make your home safe and to help prevent falls. What can I do on the outside of my home?  Regularly fix the edges of walkways and driveways and fix any cracks.  Remove anything that might make you trip as you walk through a door, such as a raised step or threshold.  Trim any bushes or trees on the path to your home.  Use bright outdoor lighting.  Clear any walking paths of anything that might make someone trip, such as rocks or tools.  Regularly check to see if handrails are loose or broken. Make sure that both sides of any steps have handrails.  Any raised decks and porches should have guardrails on the edges.  Have any leaves, snow, or ice cleared regularly.  Use sand or salt on walking paths during winter.  Clean up any spills in your garage right away. This includes oil or grease spills. What can I do in the bathroom?  Use night lights.  Install grab bars by the toilet and in the tub and shower. Do not use towel bars as grab bars.  Use non-skid mats or decals in the tub or shower.  If you need to sit down in the shower, use a plastic, non-slip stool.  Keep the floor dry. Clean up any water that spills on the floor as soon as it happens.  Remove soap  buildup in the tub or shower regularly.  Attach bath mats securely with double-sided non-slip rug tape.  Do not have throw rugs and other things on the floor that can make you trip. What can I do in the bedroom?  Use night lights.  Make sure that you have a light by your bed that is easy to reach.  Do not use any sheets or blankets that are too big for your bed. They should not hang down onto the floor.  Have a firm chair that has side arms. You can use this for support while you get dressed.  Do not have throw rugs and other things on the floor that can make you trip. What can I do in the kitchen?  Clean up any spills right away.  Avoid walking on wet floors.  Keep items that you use a lot in easy-to-reach places.  If you need to reach something above you, use a strong step stool that has a grab bar.  Keep electrical cords out of the way.  Do not use floor polish or wax that makes floors slippery. If you must use wax, use non-skid floor wax.  Do not have throw rugs and other  things on the floor that can make you trip. What can I do with my stairs?  Do not leave any items on the stairs.  Make sure that there are handrails on both sides of the stairs and use them. Fix handrails that are broken or loose. Make sure that handrails are as long as the stairways.  Check any carpeting to make sure that it is firmly attached to the stairs. Fix any carpet that is loose or worn.  Avoid having throw rugs at the top or bottom of the stairs. If you do have throw rugs, attach them to the floor with carpet tape.  Make sure that you have a light switch at the top of the stairs and the bottom of the stairs. If you do not have them, ask someone to add them for you. What else can I do to help prevent falls?  Wear shoes that:  Do not have high heels.  Have rubber bottoms.  Are comfortable and fit you well.  Are closed at the toe. Do not wear sandals.  If you use a stepladder:  Make  sure that it is fully opened. Do not climb a closed stepladder.  Make sure that both sides of the stepladder are locked into place.  Ask someone to hold it for you, if possible.  Clearly mark and make sure that you can see:  Any grab bars or handrails.  First and last steps.  Where the edge of each step is.  Use tools that help you move around (mobility aids) if they are needed. These include:  Canes.  Walkers.  Scooters.  Crutches.  Turn on the lights when you go into a dark area. Replace any light bulbs as soon as they burn out.  Set up your furniture so you have a clear path. Avoid moving your furniture around.  If any of your floors are uneven, fix them.  If there are any pets around you, be aware of where they are.  Review your medicines with your doctor. Some medicines can make you feel dizzy. This can increase your chance of falling. Ask your doctor what other things that you can do to help prevent falls. This information is not intended to replace advice given to you by your health care provider. Make sure you discuss any questions you have with your health care provider. Document Released: 01/13/2009 Document Revised: 08/25/2015 Document Reviewed: 04/23/2014 Elsevier Interactive Patient Education  2017 [employer].

## 2018-11-16 ENCOUNTER — Other Ambulatory Visit: Payer: Self-pay | Admitting: Nurse Practitioner

## 2018-11-26 ENCOUNTER — Encounter: Payer: Self-pay | Admitting: Family Medicine

## 2018-11-26 ENCOUNTER — Other Ambulatory Visit: Payer: Self-pay

## 2018-11-26 ENCOUNTER — Ambulatory Visit (INDEPENDENT_AMBULATORY_CARE_PROVIDER_SITE_OTHER): Payer: PPO | Admitting: Nurse Practitioner

## 2018-11-26 DIAGNOSIS — R350 Frequency of micturition: Secondary | ICD-10-CM

## 2018-11-26 DIAGNOSIS — R82998 Other abnormal findings in urine: Secondary | ICD-10-CM | POA: Diagnosis not present

## 2018-11-26 LAB — POCT URINALYSIS DIPSTICK
Bilirubin, UA: NEGATIVE
Glucose, UA: NEGATIVE
Ketones, UA: NEGATIVE
Nitrite, UA: POSITIVE
Protein, UA: POSITIVE — AB
Spec Grav, UA: 1.015 (ref 1.010–1.025)
Urobilinogen, UA: NEGATIVE E.U./dL — AB
pH, UA: 7 (ref 5.0–8.0)

## 2018-11-26 MED ORDER — CIPROFLOXACIN HCL 500 MG PO TABS: 500.0000 mg | ORAL_TABLET | Freq: Two times a day (BID) | ORAL | 0 refills | Status: DC

## 2018-11-26 NOTE — Patient Instructions (Signed)
[address], [place] [address] [phone]

## 2018-11-26 NOTE — Progress Notes (Signed)
Virtual Visit via Telephone Note  I connected with Jeffery Campbell  on 11/26/18 at  2:40 PM EDT by telephone and verified that I am speaking with the correct person using two identifiers.   Staff discussed the limitations, risks, security and privacy concerns of performing an evaluation and management service by telephone and the availability of in person appointments. Staff also discussed with the patient that there may be a patient responsible charge related to this service. The patient expressed understanding and agreed to proceed.  Patients location: home My location: work office  Other people in meeting: none    HPI  Patient states has been having urinary frequency, urgency and dribbling.  Denies dysuria, fevers, chills, nausea, vomiting, fatigue.   BPH/CKD: She has been seeing Michiel CowboyShannon [name] PA-C; having nocturia but it is tolerable - up 0-1 times.  Taking medications as prescribed.  Taking proscar 5mg , and alfuzosin 10mg . PHQ2/9: Depression screen [facility] 2[facility]9 11/26/2018 10/28/2018 10/08/2018 09/05/2018 07/21/2018  Decreased Interest 0 0 0 0 0  Down, Depressed, Hopeless 0 1 0 0 0  PHQ - 2 Score 0 1 0 0 0  Altered sleeping 0 0 0 - 0  Tired, decreased energy 0 0 0 - 0  Change in appetite 0 0 0 - 0  Feeling bad or failure about yourself  0 0 0 - 0  Trouble concentrating 0 0 0 - 0  Moving slowly or fidgety/restless 0 0 0 - 0  Suicidal thoughts 0 0 0 - 0  PHQ-9 Score 0 1 0 - 0  Difficult doing work/chores Not difficult at all Not difficult at all Not difficult at all - Not difficult at all  Some recent data might be hidden    PHQ reviewed. Negative  Patient Active Problem List   Diagnosis Date Noted  . Recurrent major depressive disorder, in partial remission (HCC) 10/08/2018  . Erythrocytosis 01/31/2018  . Hx of completed stroke 01/10/2018  . Prediabetes 10/07/2017  . Dizziness 07/03/2017  . Abnormal gait 10/05/2016  . Hypoxia, sleep related 04/20/2016  . Elevated hematocrit  04/04/2016  . Right flank pain 01/10/2016  . General unsteadiness 05/24/2015  . Allergic rhinitis due to pollen 11/12/2014  . Back pain, chronic 10/21/2014  . Benign paroxysmal positional nystagmus 10/21/2014  . Decreased libido 10/21/2014  . Fatigue 10/21/2014  . Gastro-esophageal reflux disease without esophagitis 10/21/2014  . Dysmetabolic syndrome 10/21/2014  . Fungal infection of nail 10/21/2014  . HLD (hyperlipidemia) 08/19/2014  . CKD (chronic kidney disease) stage 3, GFR 30-59 ml/min (HCC) 08/19/2014  . Obesity   . Asymptomatic PVCs 05/27/2014  . Essential hypertension 04/05/2014  . Osteoarthrosis involving more than one site but not generalized 08/03/2008    Past Medical History:  Diagnosis Date  . Arrhythmia   . Cough    CHRONIC  . Depression   . Disturbance of sleep   . Diverticulitis   . GERD (gastroesophageal reflux disease)   . Hemorrhoids   . Hypertension   . OA (osteoarthritis)   . Pure hypercholesterolemia   . Stroke [facility](HCC)     Past Surgical History:  Procedure Laterality Date  . CATARACT EXTRACTION W/PHACO Left 02/14/2016   Procedure: CATARACT EXTRACTION PHACO AND INTRAOCULAR LENS PLACEMENT (IOC);  Surgeon: Galen ManilaWilliam Porfilio, MD;  Location: [facility] ORS;  Service: Ophthalmology;  Laterality: Left;  US 53.7 AP% 21.5 CDE 11.58 FLUID PACK LOT # [id number]  . COLONOSCOPY    . KNEE ARTHROSCOPY    . TRANSURETHRAL RESECTION OF PROSTATE  Social History   Tobacco Use  . Smoking status: Never Smoker  . Smokeless tobacco: Never Used  . Tobacco comment: smoking cessation materials not required  Substance Use Topics  . Alcohol use: No    Alcohol/week: 0.0 standard drinks     Current Outpatient Medications:  .  alfuzosin (UROXATRAL) 10 MG 24 hr tablet, Take 1 tablet (10 mg total) by mouth daily with breakfast., Disp: 90 tablet, Rfl: 3 .  atorvastatin (LIPITOR) 20 MG tablet, TAKE 1 TABLET BY MOUTH EVERYDAY AT BEDTIME, Disp: 90 tablet, Rfl: 1 .  clopidogrel  (PLAVIX) 75 MG tablet, TAKE 1 TABLET (75 MG TOTAL) BY MOUTH DAILY. (STOP AGGRENOX) (Patient taking differently: Take 75 mg by mouth daily. ), Disp: 90 tablet, Rfl: 3 .  Cyanocobalamin (B-12) 3000 MCG SUBL, Place 3,000 mcg under the tongue daily. , Disp: 30 tablet, Rfl: 11 .  diltiazem (CARDIZEM CD) 120 MG 24 hr capsule, TAKE 1 CAPSULE BY MOUTH EVERY DAY, Disp: 90 capsule, Rfl: 1 .  docusate sodium (COLACE) 100 MG capsule, Take 1 capsule (100 mg total) by mouth 2 (two) times daily as needed for mild constipation., Disp: 14 capsule, Rfl: 0 .  famotidine (PEPCID) 20 MG tablet, Take 1 tablet (20 mg total) by mouth 2 (two) times daily as needed for heartburn or indigestion., Disp: 180 tablet, Rfl: 1 .  finasteride (PROSCAR) 5 MG tablet, Take 1 tablet (5 mg total) by mouth daily., Disp: 90 tablet, Rfl: 3 .  fluticasone (FLONASE) 50 MCG/ACT nasal spray, Place 2 sprays into both nostrils daily., Disp: , Rfl:  .  meclizine (ANTIVERT) 25 MG tablet, TAKE 1/2 TO 1 TABLET (12.5-25 MG TOTAL) BY MOUTH 3 (THREE) TIMES DAILY AS NEEDED FOR DIZZINESS., Disp: 30 tablet, Rfl: 2 .  Omega 3 1200 MG CAPS, Take 1,200 mg by mouth daily. , Disp: , Rfl:   No Known Allergies  ROS   No other specific complaints in a complete review of systems (except as listed in HPI above).  Objective  There were no vitals filed for this visit.   There is no height or weight on file to calculate BMI.  Patient is alert, able to speak in full sentences without difficulty.   Assessment & Plan  1. Frequency of urination  - POCT urinalysis dipstick - ciprofloxacin (CIPRO) 500 MG tablet; Take 1 tablet (500 mg total) by mouth 2 (two) times daily.  Dispense: 20 tablet; Refill: 0 - Urine Culture  2. Leukocytes in urine Discussed black box warning and side effects patient has no constitutional symptoms indicative of prostatitis, will follow-up in one week virtually and decided if further labs and treatment is needed. Discussed ER  precautions. Will set up routine follow-up with urologist.  - ciprofloxacin (CIPRO) 500 MG tablet; Take 1 tablet (500 mg total) by mouth 2 (two) times daily.  Dispense: 20 tablet; Refill: 0 - Urine Culture     I discussed the assessment and treatment plan with the patient. The patient was provided an opportunity to ask questions and all were answered. The patient agreed with the plan and demonstrated an understanding of the instructions.   The patient was advised to call back or seek an in-person evaluation if the symptoms worsen or if the condition fails to improve as anticipated.  I provided 48minutes of non-face-to-face time during this encounter.   Fredderick Severance, NP

## 2018-11-27 DIAGNOSIS — G4733 Obstructive sleep apnea (adult) (pediatric): Secondary | ICD-10-CM | POA: Diagnosis not present

## 2018-11-27 DIAGNOSIS — D751 Secondary polycythemia: Secondary | ICD-10-CM | POA: Diagnosis not present

## 2018-11-27 DIAGNOSIS — G4736 Sleep related hypoventilation in conditions classified elsewhere: Secondary | ICD-10-CM | POA: Diagnosis not present

## 2018-11-28 ENCOUNTER — Ambulatory Visit: Payer: Self-pay

## 2018-11-28 LAB — URINE CULTURE
MICRO NUMBER:: 818316
SPECIMEN QUALITY:: ADEQUATE

## 2018-11-28 NOTE — Telephone Encounter (Signed)
Call returned to pt.  Reported he has not had a BM for about 2 days.  Reported his regular bowel pattern is about every other day.  Stated he is not uncomfortable at all.  Denied abdominal pain or bloating, nausea/ vomiting, or rectal pain.  Stated he takes fiber supplement everyday.  Reported it feels like he might have a BM today, but requested some advice on what he should take.  Asking about taking MOM at this time.  Stated he doesn't take MOM all the time; only when he needs it.  Advised of need to make sure with daily fiber supplement, that he is drinking plenty of water.  Questioned if he should take more fiber?  Advised to have him increase fiber to BID, and increase water intake.  Advised to start walking daily; suggested he could walk about 10 min. Intervals, twice a day, to start out with.  Encouraged to eat fruits and vegetables in his diet.  Advised to try to follow the above recommendations, and to call back if constipation persists or symptoms worsen.  Verb. Understanding.  Agreed with plan.      Reason for Disposition . Treating constipation with Over-The-Counter (OTC) medicines, questions about  Answer Assessment - Initial Assessment Questions 1. STOOL PATTERN OR FREQUENCY: "How often do you pass bowel movements (BMs)?"  (Normal range: tid to q 3 days)  "When was the last BM passed?"       Has BM about every other day 2. STRAINING: "Do you have to strain to have a BM?"      Stated some straining at times  3. RECTAL PAIN: "Does your rectum hurt when the stool comes out?" If so, ask: "Do you have hemorrhoids? How bad is the pain?"  (Scale 1-10; or mild, moderate, severe)     Denied  4. STOOL COMPOSITION: "Are the stools hard?"     Large formed stool 5. BLOOD ON STOOLS: "Has there been any blood on the toilet tissue or on the surface of the BM?" If so, ask: "When was the last time?"     Denied any blood in toilet or on tissue  6. CHRONIC CONSTIPATION: "Is this a new problem for you?"   If no, ask: "How long have you had this problem?" (days, weeks, months)      Most of the time I have to take something to go to BR.  7. CHANGES IN DIET: "Have there been any recent changes in your diet?"      Denied any changes in diet. 8. MEDICATIONS: "Have you been taking any new medications?"     Started on new medication for prostate 2 days ago 9. LAXATIVES: "Have you been using any laxatives or enemas?"  If yes, ask "What, how often, and when was the last time?"     Fiber supplement  10. CAUSE: "What do you think is causing the constipation?"       Unknown  11. OTHER SYMPTOMS: "Do you have any other symptoms?" (e.g., abdominal pain, fever, vomiting)      No abdominal distension, no nausea/ vomiting 12. PREGNANCY: "Is there any chance you are pregnant?" "When was your last menstrual period?"      N/a  Protocols used: CONSTIPATION-A-AH

## 2018-12-04 DIAGNOSIS — I10 Essential (primary) hypertension: Secondary | ICD-10-CM | POA: Diagnosis not present

## 2018-12-04 DIAGNOSIS — H25011 Cortical age-related cataract, right eye: Secondary | ICD-10-CM | POA: Diagnosis not present

## 2018-12-05 ENCOUNTER — Telehealth: Payer: Self-pay | Admitting: Nurse Practitioner

## 2018-12-05 ENCOUNTER — Telehealth: Payer: Self-pay | Admitting: Family Medicine

## 2018-12-05 ENCOUNTER — Other Ambulatory Visit: Payer: Self-pay | Admitting: Nurse Practitioner

## 2018-12-05 DIAGNOSIS — R82998 Other abnormal findings in urine: Secondary | ICD-10-CM

## 2018-12-05 DIAGNOSIS — R350 Frequency of micturition: Secondary | ICD-10-CM

## 2018-12-05 NOTE — Telephone Encounter (Signed)
Spoke with Dorian Pod with [facility] to clarify any questions regarding patient's medical history.

## 2018-12-05 NOTE — Telephone Encounter (Signed)
Patient called to check the status of his medication.  Please advise and call patient to let him know when it will be at the pharmacy

## 2018-12-05 NOTE — Telephone Encounter (Signed)
Patient needs appointment within the next week.

## 2018-12-06 DIAGNOSIS — I10 Essential (primary) hypertension: Secondary | ICD-10-CM | POA: Diagnosis not present

## 2018-12-06 DIAGNOSIS — R42 Dizziness and giddiness: Secondary | ICD-10-CM | POA: Diagnosis not present

## 2018-12-06 DIAGNOSIS — R0902 Hypoxemia: Secondary | ICD-10-CM | POA: Diagnosis not present

## 2018-12-09 NOTE — Telephone Encounter (Signed)
LVM to sch appt

## 2018-12-10 ENCOUNTER — Other Ambulatory Visit: Payer: Self-pay | Admitting: Nurse Practitioner

## 2018-12-10 NOTE — Telephone Encounter (Signed)
Pt called and stated he need to speak to nurse concerning this message. ASAP

## 2018-12-10 NOTE — Telephone Encounter (Signed)
Called pt back does not feel like urine/prostate infection has cleared up all the way.  Wants to see about getting more antibiotics?

## 2018-12-11 NOTE — Telephone Encounter (Signed)
Called patient back he states not having any symptoms and is better.  He was confused?

## 2018-12-15 ENCOUNTER — Other Ambulatory Visit: Payer: Self-pay

## 2018-12-15 ENCOUNTER — Other Ambulatory Visit
Admission: RE | Admit: 2018-12-15 | Discharge: 2018-12-15 | Disposition: A | Discharge status: Home / Self Care | Payer: PPO | Source: Ambulatory Visit | Attending: Ophthalmology | Admitting: Ophthalmology

## 2018-12-15 DIAGNOSIS — Z01812 Encounter for preprocedural laboratory examination: Secondary | ICD-10-CM | POA: Diagnosis not present

## 2018-12-15 DIAGNOSIS — Z20828 Contact with and (suspected) exposure to other viral communicable diseases: Secondary | ICD-10-CM | POA: Insufficient documentation

## 2018-12-15 DIAGNOSIS — H2511 Age-related nuclear cataract, right eye: Secondary | ICD-10-CM | POA: Diagnosis not present

## 2018-12-15 LAB — SARS CORONAVIRUS 2 (TAT 6-24 HRS): SARS Coronavirus 2: NEGATIVE

## 2018-12-16 ENCOUNTER — Other Ambulatory Visit: Payer: Self-pay

## 2018-12-18 ENCOUNTER — Ambulatory Visit
Admission: RE | Admit: 2018-12-18 | Discharge: 2018-12-18 | Disposition: A | Discharge status: Home / Self Care | Payer: PPO | Attending: Ophthalmology | Admitting: Ophthalmology

## 2018-12-18 ENCOUNTER — Ambulatory Visit: Payer: PPO | Admitting: Anesthesiology

## 2018-12-18 ENCOUNTER — Other Ambulatory Visit: Payer: Self-pay

## 2018-12-18 ENCOUNTER — Encounter: Payer: Self-pay | Admitting: *Deleted

## 2018-12-18 ENCOUNTER — Encounter
Admission: RE | Disposition: A | Discharge status: Home / Self Care | Payer: Self-pay | Source: Home / Self Care | Attending: Ophthalmology

## 2018-12-18 ENCOUNTER — Ambulatory Visit: Payer: PPO | Admitting: Urology

## 2018-12-18 DIAGNOSIS — K219 Gastro-esophageal reflux disease without esophagitis: Secondary | ICD-10-CM | POA: Diagnosis not present

## 2018-12-18 DIAGNOSIS — N400 Enlarged prostate without lower urinary tract symptoms: Secondary | ICD-10-CM | POA: Insufficient documentation

## 2018-12-18 DIAGNOSIS — H2511 Age-related nuclear cataract, right eye: Secondary | ICD-10-CM | POA: Insufficient documentation

## 2018-12-18 DIAGNOSIS — I10 Essential (primary) hypertension: Secondary | ICD-10-CM | POA: Diagnosis not present

## 2018-12-18 DIAGNOSIS — Z8673 Personal history of transient ischemic attack (TIA), and cerebral infarction without residual deficits: Secondary | ICD-10-CM | POA: Insufficient documentation

## 2018-12-18 DIAGNOSIS — H25011 Cortical age-related cataract, right eye: Secondary | ICD-10-CM | POA: Diagnosis not present

## 2018-12-18 DIAGNOSIS — Z7902 Long term (current) use of antithrombotics/antiplatelets: Secondary | ICD-10-CM | POA: Insufficient documentation

## 2018-12-18 DIAGNOSIS — E7800 Pure hypercholesterolemia, unspecified: Secondary | ICD-10-CM | POA: Insufficient documentation

## 2018-12-18 DIAGNOSIS — N183 Chronic kidney disease, stage 3 (moderate): Secondary | ICD-10-CM | POA: Diagnosis not present

## 2018-12-18 DIAGNOSIS — G4730 Sleep apnea, unspecified: Secondary | ICD-10-CM | POA: Diagnosis not present

## 2018-12-18 DIAGNOSIS — I129 Hypertensive chronic kidney disease with stage 1 through stage 4 chronic kidney disease, or unspecified chronic kidney disease: Secondary | ICD-10-CM | POA: Diagnosis not present

## 2018-12-18 HISTORY — PX: CATARACT EXTRACTION W/PHACO: SHX586

## 2018-12-18 HISTORY — DX: Sleep apnea, unspecified: G47.30

## 2018-12-18 SURGERY — PHACOEMULSIFICATION, CATARACT, WITH IOL INSERTION
Anesthesia: Monitor Anesthesia Care | Site: Eye | Laterality: Right

## 2018-12-18 MED ORDER — MOXIFLOXACIN HCL 0.5 % OP SOLN: 1.0000 [drp] | Freq: Once | OPHTHALMIC | Status: DC

## 2018-12-18 MED ORDER — MOXIFLOXACIN HCL 0.5 % OP SOLN
OPHTHALMIC | Status: AC
  Filled 2018-12-18: qty 3

## 2018-12-18 MED ORDER — POVIDONE-IODINE 5 % OP SOLN
OPHTHALMIC | Status: AC
  Filled 2018-12-18: qty 30

## 2018-12-18 MED ORDER — LIDOCAINE HCL (PF) 4 % IJ SOLN
INTRAOCULAR | Status: DC | PRN
  Administered 2018-12-18: 2.25 mL via OPHTHALMIC

## 2018-12-18 MED ORDER — EPINEPHRINE PF 1 MG/ML IJ SOLN
INTRAOCULAR | Status: DC | PRN
  Administered 2018-12-18: 1 mL via OPHTHALMIC

## 2018-12-18 MED ORDER — NA HYALUR & NA CHOND-NA HYALUR 0.55-0.5 ML IO KIT
PACK | INTRAOCULAR | Status: AC
  Filled 2018-12-18: qty 1.05

## 2018-12-18 MED ORDER — ARMC OPHTHALMIC DILATING DROPS
1.0000 "application " | OPHTHALMIC | Status: AC
  Administered 2018-12-18 (×3): 1 via OPHTHALMIC

## 2018-12-18 MED ORDER — NA CHONDROIT SULF-NA HYALURON 40-17 MG/ML IO SOLN
INTRAOCULAR | Status: DC | PRN
  Administered 2018-12-18: 1 mL via INTRAOCULAR

## 2018-12-18 MED ORDER — MIDAZOLAM HCL 2 MG/2ML IJ SOLN
INTRAMUSCULAR | Status: AC
  Filled 2018-12-18: qty 2

## 2018-12-18 MED ORDER — LIDOCAINE HCL (PF) 4 % IJ SOLN
INTRAMUSCULAR | Status: AC
  Filled 2018-12-18: qty 5

## 2018-12-18 MED ORDER — TETRACAINE HCL 0.5 % OP SOLN
1.0000 [drp] | Freq: Once | OPHTHALMIC | Status: AC
  Administered 2018-12-18: 09:00:00 1 [drp] via OPHTHALMIC

## 2018-12-18 MED ORDER — TRYPAN BLUE 0.06 % OP SOLN
OPHTHALMIC | Status: AC
  Filled 2018-12-18: qty 0.5

## 2018-12-18 MED ORDER — ONDANSETRON HCL 4 MG/2ML IJ SOLN
INTRAMUSCULAR | Status: DC | PRN
  Administered 2018-12-18: 4 mg via INTRAVENOUS

## 2018-12-18 MED ORDER — NA CHONDROIT SULF-NA HYALURON 40-17 MG/ML IO SOLN
INTRAOCULAR | Status: AC
  Filled 2018-12-18: qty 1

## 2018-12-18 MED ORDER — ARMC OPHTHALMIC DILATING DROPS
OPHTHALMIC | Status: AC
  Administered 2018-12-18: 1 via OPHTHALMIC
  Filled 2018-12-18: qty 0.5

## 2018-12-18 MED ORDER — FENTANYL CITRATE (PF) 100 MCG/2ML IJ SOLN
INTRAMUSCULAR | Status: AC
  Filled 2018-12-18: qty 2

## 2018-12-18 MED ORDER — CARBACHOL 0.01 % IO SOLN
INTRAOCULAR | Status: DC | PRN
  Administered 2018-12-18: 0.5 mL via INTRAOCULAR

## 2018-12-18 MED ORDER — POVIDONE-IODINE 5 % OP SOLN
OPHTHALMIC | Status: DC | PRN
  Administered 2018-12-18: 1 via OPHTHALMIC

## 2018-12-18 MED ORDER — DORZOLAMIDE HCL-TIMOLOL MAL 2-0.5 % OP SOLN
OPHTHALMIC | Status: DC | PRN
  Administered 2018-12-18: 2 [drp] via OPHTHALMIC

## 2018-12-18 MED ORDER — DORZOLAMIDE HCL-TIMOLOL MAL 2-0.5 % OP SOLN
OPHTHALMIC | Status: AC
  Filled 2018-12-18: qty 10

## 2018-12-18 MED ORDER — MIDAZOLAM HCL 2 MG/2ML IJ SOLN
INTRAMUSCULAR | Status: DC | PRN
  Administered 2018-12-18 (×2): 0.5 mg via INTRAVENOUS

## 2018-12-18 MED ORDER — SODIUM CHLORIDE 0.9 % IV SOLN
INTRAVENOUS | Status: DC
  Administered 2018-12-18 (×2): via INTRAVENOUS

## 2018-12-18 MED ORDER — TETRACAINE HCL 0.5 % OP SOLN
OPHTHALMIC | Status: AC
  Administered 2018-12-18: 09:00:00 1 [drp] via OPHTHALMIC
  Filled 2018-12-18: qty 4

## 2018-12-18 MED ORDER — TRYPAN BLUE 0.06 % OP SOLN
OPHTHALMIC | Status: DC | PRN
  Administered 2018-12-18: .5 mL via INTRAOCULAR

## 2018-12-18 MED ORDER — EPINEPHRINE PF 1 MG/ML IJ SOLN
INTRAMUSCULAR | Status: AC
  Filled 2018-12-18: qty 1

## 2018-12-18 MED ORDER — ONDANSETRON HCL 4 MG/2ML IJ SOLN
INTRAMUSCULAR | Status: AC
  Filled 2018-12-18: qty 2

## 2018-12-18 MED ORDER — MOXIFLOXACIN HCL 0.5 % OP SOLN
OPHTHALMIC | Status: DC | PRN
  Administered 2018-12-18: .2 mL via OPHTHALMIC

## 2018-12-18 SURGICAL SUPPLY — 17 items
DISSECTOR HYDRO NUCLEUS 50X22 (MISCELLANEOUS) ×12 IMPLANT
DRSG TEGADERM 2-3/8X2-3/4 SM (GAUZE/BANDAGES/DRESSINGS) ×3 IMPLANT
GLOVE BIOGEL M 6.5 STRL (GLOVE) ×3 IMPLANT
GOWN STRL REUS W/ TWL LRG LVL3 (GOWN DISPOSABLE) ×1 IMPLANT
GOWN STRL REUS W/ TWL XL LVL3 (GOWN DISPOSABLE) ×1 IMPLANT
GOWN STRL REUS W/TWL LRG LVL3 (GOWN DISPOSABLE) ×2
GOWN STRL REUS W/TWL XL LVL3 (GOWN DISPOSABLE) ×2
KNIFE 45D UP 2.3 (MISCELLANEOUS) ×3 IMPLANT
LABEL CATARACT MEDS ST (LABEL) ×3 IMPLANT
LENS IOL TECNIS ITEC 20.5 (Intraocular Lens) ×2 IMPLANT
PACK CATARACT (MISCELLANEOUS) ×3 IMPLANT
PACK CATARACT KING (MISCELLANEOUS) ×3 IMPLANT
PACK EYE AFTER SURG (MISCELLANEOUS) ×3 IMPLANT
SOL BSS BAG (MISCELLANEOUS) ×3
SOLUTION BSS BAG (MISCELLANEOUS) ×1 IMPLANT
WATER STERILE IRR 250ML POUR (IV SOLUTION) ×3 IMPLANT
WIPE NON LINTING 3.25X3.25 (MISCELLANEOUS) ×3 IMPLANT

## 2018-12-18 NOTE — Discharge Instructions (Addendum)
Eye Surgery Discharge Instructions  Expect mild scratchy sensation or mild soreness. DO NOT RUB YOUR EYE!  The day of surgery:  Minimal physical activity, but bed rest is not required  No reading, computer work, or close hand work  No bending, lifting, or straining.  May watch TV  For 24 hours:  No driving, legal decisions, or alcoholic beverages  Safety precautions  Eat anything you prefer: It is better to start with liquids, then soup then solid foods.  Solar shield eyeglasses should be worn for comfort in the sunlight/patch while sleeping  Resume all regular medications including aspirin or Coumadin if these were discontinued prior to surgery. You may shower, bathe, shave, or wash your hair. Tylenol may be taken for mild discomfort. Follow eye drop instruction sheet as reviewed.  Call your doctor if you experience significant pain, nausea, or vomiting, fever > 101 or other signs of infection. [phone] or [phone] Specific instructions:  Follow-up Information    Marchia Meiers, MD Follow up.   Specialty: Ophthalmology Why: 12-19-18 @ 10:45 am  Contact information: [address] [place] [address] [phone]

## 2018-12-18 NOTE — Op Note (Signed)
  PREOPERATIVE DIAGNOSIS:  Nuclear sclerotic cataract of the RIGHT eye.   POSTOPERATIVE DIAGNOSIS:  Nuclear sclerotic cataract of the RIGHT eye.   OPERATIVE PROCEDURE: Cataract surgery OD   SURGEON:  Marchia Meiers, MD.   ANESTHESIA:  Anesthesiologist: Alphonsus Sias, MD CRNA: Kelton Pillar, CRNA  1.      Managed anesthesia care. 2.     0.39ml of Shugarcaine was instilled following the paracentesis   COMPLICATIONS:  None.   TECHNIQUE:   Divide and conquer   DESCRIPTION OF PROCEDURE:  The patient was examined and consented in the preoperative holding area where the aforementioned topical anesthesia was applied to the RIGHT eye and then brought back to the Operating Room where the RIGHT eye was prepped and draped in the usual sterile ophthalmic fashion and a lid speculum was placed. A paracentesis was created with the side port blade, the anterior chamber was washed out with trypan blue to stain the anterior capsule, and the anterior chamber was filled with viscoelastic. A near clear corneal incision was performed with the steel keratome. A continuous curvilinear capsulorrhexis was performed with a cystotome followed by the capsulorrhexis forceps. Hydrodissection and hydrodelineation were carried out with BSS on a blunt cannula. The lens was removed in a divide and conquer  technique and the remaining cortical material was removed with the irrigation-aspiration handpiece. The capsular bag was inflated with viscoelastic and the lens was placed in the capsular bag without complication. The remaining viscoelastic was removed from the eye with the irrigation-aspiration handpiece. The wounds were hydrated. The anterior chamber was flushed and the eye was inflated to physiologic pressure. 0.70ml Vigamox was placed in the anterior chamber. The wounds were found to be water tight. The eye was dressed with Vigamox. The patient was given protective glasses to wear throughout the day and a shield  with which to sleep tonight. The patient was also given drops with which to begin a drop regimen today and will follow-up with me in one day. Implant Name Type Inv. Item Serial No. Manufacturer Lot No. LRB No. Used Action  LENS IOL DIOP 20.5 - [id number] Intraocular Lens LENS IOL DIOP 20.5 [id number]  Right 1 Implanted    Procedure(s) with comments: CATARACT EXTRACTION PHACO AND INTRAOCULAR LENS PLACEMENT (IOC)  RIGHT, VISION BLUE (Right) - [place]  01:14 CDE 11.82 Fluid pack lot # [id number]  Electronically signed: Marchia Meiers 12/18/2018 1:55 PM

## 2018-12-18 NOTE — H&P (Signed)
   I have reviewed the patient's H&P and agree with its findings. There have been no interval changes.  Champ Keetch MD Ophthalmology 

## 2018-12-18 NOTE — Anesthesia Postprocedure Evaluation (Signed)
Anesthesia Post Note  Patient: Ramell Wacha  Procedure(s) Performed: CATARACT EXTRACTION PHACO AND INTRAOCULAR LENS PLACEMENT (IOC)  RIGHT, VISION BLUE (Right Eye)  Patient location during evaluation: Phase II Anesthesia Type: MAC Level of consciousness: awake and alert Pain management: pain level controlled Vital Signs Assessment: post-procedure vital signs reviewed and stable Respiratory status: spontaneous breathing, nonlabored ventilation and respiratory function stable Cardiovascular status: blood pressure returned to baseline and stable Postop Assessment: no apparent nausea or vomiting Anesthetic complications: no     Last Vitals:  Vitals:   12/18/18 0839 12/18/18 1049  BP: 123/74 136/61  Pulse: 70 (!) 55  Resp: 16 16  Temp: (!) 36.3 C (!) 36.4 C  SpO2: 95% 97%    Last Pain:  Vitals:   12/18/18 1049  TempSrc: Temporal  PainSc: 0-No pain                 Alphonsus Sias

## 2018-12-18 NOTE — Anesthesia Preprocedure Evaluation (Signed)
Anesthesia Evaluation  Patient identified by MRN, date of birth, ID band Patient awake    Reviewed: Allergy & Precautions, H&P , NPO status , reviewed documented beta blocker date and time   Airway Mallampati: II  TM Distance: >3 FB Neck ROM: full    Dental  (+) Chipped   Pulmonary sleep apnea ,    Pulmonary exam normal        Cardiovascular hypertension, Normal cardiovascular exam  2016 ECHO Study Conclusions  - Left ventricle: The cavity size was normal. Systolic function was   normal. The estimated ejection fraction was in the range of 60%   to 65%. Wall motion was normal; there were no regional wall   motion abnormalities. Doppler parameters are consistent with   abnormal left ventricular relaxation (grade 1 diastolic   dysfunction). - Aortic valve: There was trivial regurgitation. - Left atrium: The atrium was moderately dilated. - Right ventricle: Systolic function was normal. - Pulmonary arteries: Systolic pressure was within the normal   range.   Neuro/Psych PSYCHIATRIC DISORDERS Depression CVA    GI/Hepatic GERD  Medicated and Controlled,  Endo/Other    Renal/GU Renal disease     Musculoskeletal  (+) Arthritis ,   Abdominal   Peds  Hematology   Anesthesia Other Findings Past Medical History: No date: Arrhythmia No date: Cough     Comment:  CHRONIC No date: Depression No date: Disturbance of sleep No date: Diverticulitis No date: GERD (gastroesophageal reflux disease) No date: Hemorrhoids No date: Hypertension No date: OA (osteoarthritis) No date: Pure hypercholesterolemia No date: Sleep apnea No date: Stroke [facility])  Past Surgical History: 02/14/2016: CATARACT EXTRACTION W/PHACO; Left     Comment:  Procedure: CATARACT EXTRACTION PHACO AND INTRAOCULAR               LENS PLACEMENT (IOC);  Surgeon: Birder Robson, MD;                Location: [facility] ORS;  Service: Ophthalmology;  Laterality:              Left;  [place] 53.7 AP% 21.5 CDE 11.58 FLUID PACK LOT #               [id number] No date: COLONOSCOPY No date: KNEE ARTHROSCOPY No date: TRANSURETHRAL RESECTION OF PROSTATE  BMI    Body Mass Index: 31.53 kg/m      Reproductive/Obstetrics                             Anesthesia Physical Anesthesia Plan  ASA: III  Anesthesia Plan: MAC   Post-op Pain Management:    Induction: Intravenous  PONV Risk Score and Plan: 1 and Treatment may vary due to age or medical condition, TIVA and Midazolam  Airway Management Planned: Nasal Cannula and Natural Airway  Additional Equipment:   Intra-op Plan:   Post-operative Plan:   Informed Consent: I have reviewed the patients History and Physical, chart, labs and discussed the procedure including the risks, benefits and alternatives for the proposed anesthesia with the patient or authorized representative who has indicated his/her understanding and acceptance.     Dental Advisory Given  Plan Discussed with: CRNA  Anesthesia Plan Comments:         Anesthesia Quick Evaluation

## 2018-12-18 NOTE — Transfer of Care (Signed)
Immediate Anesthesia Transfer of Care Note  Patient: Erbie Arment  Procedure(s) Performed: CATARACT EXTRACTION PHACO AND INTRAOCULAR LENS PLACEMENT (IOC)  RIGHT, VISION BLUE (Right Eye)  Patient Location: PACU  Anesthesia Type:MAC  Level of Consciousness: awake, alert , oriented and patient cooperative  Airway & Oxygen Therapy: Patient Spontanous Breathing  Post-op Assessment: Report given to RN and Post -op Vital signs reviewed and stable  Post vital signs: Reviewed and stable  Last Vitals:  Vitals Value Taken Time  BP 136/61 12/18/18 1049  Temp 36.4 C 12/18/18 1049  Pulse 55 12/18/18 1049  Resp 16 12/18/18 1049  SpO2 97 % 12/18/18 1049    Last Pain:  Vitals:   12/18/18 1049  TempSrc: Temporal  PainSc: 0-No pain         Complications: No apparent anesthesia complications

## 2018-12-18 NOTE — Anesthesia Post-op Follow-up Note (Signed)
Anesthesia QCDR form completed.        

## 2018-12-19 MED ORDER — CLOPIDOGREL BISULFATE 75 MG PO TABS: 75.0000 mg | ORAL_TABLET | Freq: Every day | ORAL | 3 refills | Status: DC

## 2018-12-26 ENCOUNTER — Other Ambulatory Visit: Payer: Self-pay | Admitting: Family Medicine

## 2018-12-26 NOTE — Telephone Encounter (Signed)
Requested medication (s) are due for refill today: yes  Requested medication (s) are on the active medication list: yes  Last refill: 10/03/2018  Future visit scheduled: yes  Notes to clinic:  Review for refill   Requested Prescriptions  Pending Prescriptions Disp Refills   famotidine (PEPCID) 20 MG tablet [Pharmacy Med Name: FAMOTIDINE 20 MG TABLET] 180 tablet 1    Sig: Take 1 tablet (20 mg total) by mouth 2 (two) times daily as needed for heartburn or indigestion.     Gastroenterology:  H2 Antagonists Passed - 12/26/2018  8:20 AM      Passed - Valid encounter within last 12 months    Recent Outpatient Visits          1 month ago Frequency of urination   [place], NP   2 months ago Benign prostatic hyperplasia, unspecified whether lower urinary tract symptoms present   [place], [name]   5 months ago Benign prostatic hyperplasia with post-void dribbling   [place], NP   5 months ago Constipation, unspecified constipation type   [place], Satira Anis, MD   7 months ago Cough   [place], NP      Future Appointments            In 3 months Delsa Grana, PA-C [facility], [place]   In 9 months [name], Shannon A, [place]   In 10 months  [facility], [facility]

## 2018-12-28 DIAGNOSIS — G4736 Sleep related hypoventilation in conditions classified elsewhere: Secondary | ICD-10-CM | POA: Diagnosis not present

## 2018-12-28 DIAGNOSIS — G4733 Obstructive sleep apnea (adult) (pediatric): Secondary | ICD-10-CM | POA: Diagnosis not present

## 2018-12-28 DIAGNOSIS — D751 Secondary polycythemia: Secondary | ICD-10-CM | POA: Diagnosis not present

## 2019-01-16 ENCOUNTER — Telehealth: Payer: Self-pay | Admitting: Family Medicine

## 2019-01-16 NOTE — Telephone Encounter (Signed)
Called patient. NA.

## 2019-01-16 NOTE — Telephone Encounter (Signed)
Patient requesting call back from CMA to discuss medication list.

## 2019-01-19 DIAGNOSIS — Z961 Presence of intraocular lens: Secondary | ICD-10-CM | POA: Diagnosis not present

## 2019-01-20 ENCOUNTER — Telehealth: Payer: Self-pay | Admitting: Family Medicine

## 2019-01-20 ENCOUNTER — Other Ambulatory Visit: Payer: Self-pay

## 2019-01-20 MED ORDER — MECLIZINE HCL 25 MG PO TABS: ORAL_TABLET | ORAL | 2 refills | Status: DC

## 2019-01-20 NOTE — Telephone Encounter (Signed)
Copied from [place] [id number]. Topic: Quick Communication - Rx Refill/Question >> Jan 20, 2019  9:26 AM Leward Quan A wrote: Medication: ciprofloxacin (CIPRO) 500 MG tablet   Has the patient contacted their pharmacy? Yes.   (Agent: If no, request that the patient contact the pharmacy for the refill.) (Agent: If yes, when and what did the pharmacy advise?)  Preferred Pharmacy (with phone number or street name): [id number] - [place], [place] - [address] [phone] (Phone) [phone] (Fax)    Agent: Please be advised that RX refills may take up to 3 business days. We ask that you follow-up with your pharmacy.

## 2019-01-20 NOTE — Telephone Encounter (Signed)
Please make appointment.

## 2019-01-20 NOTE — Telephone Encounter (Signed)
Virtual appt scheduled with Raquel Sarna

## 2019-01-21 ENCOUNTER — Encounter: Payer: Self-pay | Admitting: Family Medicine

## 2019-01-21 ENCOUNTER — Ambulatory Visit (INDEPENDENT_AMBULATORY_CARE_PROVIDER_SITE_OTHER): Payer: PPO | Admitting: Family Medicine

## 2019-01-21 ENCOUNTER — Other Ambulatory Visit: Payer: Self-pay

## 2019-01-21 DIAGNOSIS — N400 Enlarged prostate without lower urinary tract symptoms: Secondary | ICD-10-CM

## 2019-01-21 DIAGNOSIS — R350 Frequency of micturition: Secondary | ICD-10-CM | POA: Diagnosis not present

## 2019-01-21 NOTE — Progress Notes (Addendum)
Name: Jeffery Campbell   MRN: [id number]    DOB: 1928-06-12   Date:01/21/2019       Progress Note  Subjective  Chief Complaint  Chief Complaint  Patient presents with  . Urinary Frequency    dripping    I connected with  [name] on 01/21/19 at  8:00 AM EDT by telephone and verified that I am speaking with the correct person using two identifiers.   I discussed the limitations, risks, security and privacy concerns of performing an evaluation and management service by telephone and the availability of in person appointments. Staff also discussed with the patient that there may be a patient responsible charge related to this service. Patient Location: Home Provider Location: Home Office Additional Individuals present: None  HPI  Pt presents with concern for urinary frequency, also having occasional urinary incontinence.  He denies any urinary retention, urinating well today and denies difficulty in emptying his bladder.  He denies dysuria, fever/chills, abdominal or back pain, no frank hematuria.  He has BPH with LUTS and was seeing Willodean Rosenthal with urology - last visit was June 2020 and he is taking alfuzosin and finasteride.  He feels he may have a UTI due to increased urinary frequency over the last few days and will come to office to provide urine sample.  He denies history of nephrolithiasis.  Patient Active Problem List   Diagnosis Date Noted  . Recurrent major depressive disorder, in partial remission (HCC) 10/08/2018  . Erythrocytosis 01/31/2018  . Hx of completed stroke 01/10/2018  . Prediabetes 10/07/2017  . Dizziness 07/03/2017  . Abnormal gait 10/05/2016  . Hypoxia, sleep related 04/20/2016  . Elevated hematocrit 04/04/2016  . Right flank pain 01/10/2016  . General unsteadiness 05/24/2015  . Allergic rhinitis due to pollen 11/12/2014  . Back pain, chronic 10/21/2014  . Benign paroxysmal positional nystagmus 10/21/2014  . Decreased libido 10/21/2014  . Fatigue  10/21/2014  . Gastro-esophageal reflux disease without esophagitis 10/21/2014  . Dysmetabolic syndrome 10/21/2014  . Fungal infection of nail 10/21/2014  . HLD (hyperlipidemia) 08/19/2014  . CKD (chronic kidney disease) stage 3, GFR 30-59 ml/min 08/19/2014  . Obesity   . Asymptomatic PVCs 05/27/2014  . Essential hypertension 04/05/2014  . Osteoarthrosis involving more than one site but not generalized 08/03/2008    Social History   Tobacco Use  . Smoking status: Never Smoker  . Smokeless tobacco: Never Used  . Tobacco comment: smoking cessation materials not required  Substance Use Topics  . Alcohol use: No    Alcohol/week: 0.0 standard drinks     Current Outpatient Medications:  .  acetaminophen (TYLENOL) 325 MG tablet, Take 325-650 mg by mouth every 6 (six) hours as needed for moderate pain or headache., Disp: , Rfl:  .  alfuzosin (UROXATRAL) 10 MG 24 hr tablet, Take 1 tablet (10 mg total) by mouth daily with breakfast., Disp: 90 tablet, Rfl: 3 .  atorvastatin (LIPITOR) 20 MG tablet, TAKE 1 TABLET BY MOUTH EVERYDAY AT BEDTIME (Patient taking differently: Take 20 mg by mouth daily at 2 PM. ), Disp: 90 tablet, Rfl: 1 .  clopidogrel (PLAVIX) 75 MG tablet, Take 1 tablet (75 mg total) by mouth daily. (stop aggrenox), Disp: 90 tablet, Rfl: 3 .  Cyanocobalamin (B-12) 3000 MCG SUBL, Place 3,000 mcg under the tongue daily. , Disp: 30 tablet, Rfl: 11 .  diltiazem (CARDIZEM CD) 120 MG 24 hr capsule, TAKE 1 CAPSULE BY MOUTH EVERY DAY (Patient taking differently: Take 120 mg by  mouth daily. ), Disp: 90 capsule, Rfl: 1 .  docusate sodium (COLACE) 100 MG capsule, Take 100 mg by mouth daily as needed for mild constipation., Disp: , Rfl:  .  famotidine (PEPCID) 20 MG tablet, TAKE 1 TABLET (20 MG TOTAL) BY MOUTH 2 (TWO) TIMES DAILY AS NEEDED FOR HEARTBURN OR INDIGESTION., Disp: 180 tablet, Rfl: 1 .  finasteride (PROSCAR) 5 MG tablet, Take 1 tablet (5 mg total) by mouth daily., Disp: 90 tablet, Rfl:  3 .  fluticasone (FLONASE) 50 MCG/ACT nasal spray, Place 2 sprays into both nostrils daily as needed for allergies. , Disp: , Rfl:  .  meclizine (ANTIVERT) 25 MG tablet, TAKE 1/2 TO 1 TABLET (12.5-25 MG TOTAL) BY MOUTH 3 (THREE) TIMES DAILY AS NEEDED FOR DIZZINESS., Disp: 30 tablet, Rfl: 2 .  Omega 3 1200 MG CAPS, Take 1,200 mg by mouth daily. , Disp: , Rfl:  .  Propylene Glycol (SYSTANE BALANCE) 0.6 % SOLN, Place 1 drop into both eyes 3 (three) times daily as needed (dry eyes)., Disp: , Rfl:   No Known Allergies  I personally reviewed active problem list, medication list, allergies, notes from last encounter, lab results with the patient/caregiver today.  ROS  Ten systems reviewed and is negative except as mentioned in HPI  Objective  Virtual encounter, vitals not obtained.  There is no height or weight on file to calculate BMI.  Nursing Note and Vital Signs reviewed.  Physical Exam  Pulmonary/Chest: Effort normal. No respiratory distress. Speaking in complete sentences Neurological: Pt is alert and oriented to person, place, and time. Coordination, speech and gait are normal.  Psychiatric: Patient has a normal mood and affect. behavior is normal. Judgment and thought content normal.   No results found for this or any previous visit (from the past 72 hour(s)).  Assessment & Plan  1. Urinary frequency - He will monitor for urinary retention, fevers/chills; discussed risk of prostatitis; he does sound quite well today and reports feeling well overall.  - Urinalysis, Complete - Urine Culture  2. Benign prostatic hyperplasia, unspecified whether lower urinary tract symptoms present - He will need to follow back up with urology soon - if culture positive, this will mark 2 urinary tract infections 2 months apart; if the culture is negative, I am concerned that he is having worsening BPH symptoms.   -Red flags and when to present for emergency care or RTC including fever >101.63F,  chest pain, shortness of breath, new/worsening/un-resolving symptoms, reviewed with patient at time of visit. Follow up and care instructions discussed and provided in AVS. - I discussed the assessment and treatment plan with the patient. The patient was provided an opportunity to ask questions and all were answered. The patient agreed with the plan and demonstrated an understanding of the instructions.  - The patient was advised to call back or seek an in-person evaluation if the symptoms worsen or if the condition fails to improve as anticipated.  I provided 12 minutes of non-face-to-face time during this encounter.  Hubbard Hartshorn, [name]

## 2019-01-23 LAB — URINALYSIS, COMPLETE
Bacteria, UA: NONE SEEN /HPF
Bilirubin Urine: NEGATIVE
Glucose, UA: NEGATIVE
Hgb urine dipstick: NEGATIVE
Hyaline Cast: NONE SEEN /LPF
Ketones, ur: NEGATIVE
Leukocytes,Ua: NEGATIVE
Nitrite: NEGATIVE
Protein, ur: NEGATIVE
Specific Gravity, Urine: 1.016 (ref 1.001–1.03)
Squamous Epithelial / HPF: NONE SEEN /HPF (ref <=5)
WBC, UA: NONE SEEN /HPF (ref 0–5)
pH: 6.5 (ref 5.0–8.0)

## 2019-01-23 LAB — URINE CULTURE
MICRO NUMBER:: 1015361
SPECIMEN QUALITY:: ADEQUATE

## 2019-01-27 DIAGNOSIS — G4733 Obstructive sleep apnea (adult) (pediatric): Secondary | ICD-10-CM | POA: Diagnosis not present

## 2019-01-27 DIAGNOSIS — D751 Secondary polycythemia: Secondary | ICD-10-CM | POA: Diagnosis not present

## 2019-01-27 DIAGNOSIS — G4736 Sleep related hypoventilation in conditions classified elsewhere: Secondary | ICD-10-CM | POA: Diagnosis not present

## 2019-02-10 ENCOUNTER — Encounter: Payer: Self-pay | Admitting: Internal Medicine

## 2019-02-10 ENCOUNTER — Other Ambulatory Visit: Payer: Self-pay

## 2019-02-10 DIAGNOSIS — D751 Secondary polycythemia: Secondary | ICD-10-CM

## 2019-02-10 NOTE — Progress Notes (Signed)
Patient pre screened for office appointment, no questions or concerns today. Patient reminded of upcoming appointment time and date. 

## 2019-02-11 ENCOUNTER — Other Ambulatory Visit: Payer: Self-pay

## 2019-02-11 ENCOUNTER — Inpatient Hospital Stay: Payer: PPO | Attending: Internal Medicine

## 2019-02-11 ENCOUNTER — Inpatient Hospital Stay (HOSPITAL_BASED_OUTPATIENT_CLINIC_OR_DEPARTMENT_OTHER): Payer: PPO | Admitting: Internal Medicine

## 2019-02-11 ENCOUNTER — Inpatient Hospital Stay: Payer: PPO

## 2019-02-11 DIAGNOSIS — I10 Essential (primary) hypertension: Secondary | ICD-10-CM | POA: Insufficient documentation

## 2019-02-11 DIAGNOSIS — K219 Gastro-esophageal reflux disease without esophagitis: Secondary | ICD-10-CM | POA: Diagnosis not present

## 2019-02-11 DIAGNOSIS — D751 Secondary polycythemia: Secondary | ICD-10-CM | POA: Insufficient documentation

## 2019-02-11 DIAGNOSIS — G4730 Sleep apnea, unspecified: Secondary | ICD-10-CM | POA: Insufficient documentation

## 2019-02-11 DIAGNOSIS — Z8673 Personal history of transient ischemic attack (TIA), and cerebral infarction without residual deficits: Secondary | ICD-10-CM | POA: Diagnosis not present

## 2019-02-11 DIAGNOSIS — E7800 Pure hypercholesterolemia, unspecified: Secondary | ICD-10-CM | POA: Diagnosis not present

## 2019-02-11 DIAGNOSIS — M1990 Unspecified osteoarthritis, unspecified site: Secondary | ICD-10-CM | POA: Insufficient documentation

## 2019-02-11 DIAGNOSIS — Z79899 Other long term (current) drug therapy: Secondary | ICD-10-CM | POA: Diagnosis not present

## 2019-02-11 LAB — CBC WITH DIFFERENTIAL/PLATELET
Abs Immature Granulocytes: 0.02 10*3/uL (ref 0.00–0.07)
Basophils Absolute: 0.1 10*3/uL (ref 0.0–0.1)
Basophils Relative: 1 %
Eosinophils Absolute: 0.2 10*3/uL (ref 0.0–0.5)
Eosinophils Relative: 3 %
HCT: 48.9 % (ref 39.0–52.0)
Hemoglobin: 16.8 g/dL (ref 13.0–17.0)
Immature Granulocytes: 0 %
Lymphocytes Relative: 24 %
Lymphs Abs: 1.6 10*3/uL (ref 0.7–4.0)
MCH: 31 pg (ref 26.0–34.0)
MCHC: 34.4 g/dL (ref 30.0–36.0)
MCV: 90.2 fL (ref 80.0–100.0)
Monocytes Absolute: 0.7 10*3/uL (ref 0.1–1.0)
Monocytes Relative: 10 %
Neutro Abs: 4.1 10*3/uL (ref 1.7–7.7)
Neutrophils Relative %: 62 %
Platelets: 211 10*3/uL (ref 150–400)
RBC: 5.42 MIL/uL (ref 4.22–5.81)
RDW: 12 % (ref 11.5–15.5)
WBC: 6.7 10*3/uL (ref 4.0–10.5)
nRBC: 0 % (ref 0.0–0.2)

## 2019-02-11 LAB — BASIC METABOLIC PANEL
Anion gap: 6 (ref 5–15)
BUN: 14 mg/dL (ref 8–23)
CO2: 24 mmol/L (ref 22–32)
Calcium: 8.9 mg/dL (ref 8.9–10.3)
Chloride: 109 mmol/L (ref 98–111)
Creatinine, Ser: 1.15 mg/dL (ref 0.61–1.24)
GFR calc Af Amer: >60 mL/min (ref >60)
GFR calc non Af Amer: 57 mL/min — ABNORMAL LOW (ref >60)
Glucose, Bld: 100 mg/dL — ABNORMAL HIGH (ref 70–99)
Potassium: 4.1 mmol/L (ref 3.5–5.1)
Sodium: 139 mmol/L (ref 135–145)

## 2019-02-11 NOTE — Assessment & Plan Note (Addendum)
#   Erythrocytosis isolated-unclear etiology/however SECONDARY -negative Jak 2/normal erythropoietin levels.  #Today hemoglobin 17; hematocrit  48; patient is asymptomatic hold off phlebotomy.  # HTN- STABLE.  # DISPOSITION: # NO phelbotomy today # Follow up in 12 months/MD/lab- cbc/bmp/possible Phlebtotomy- Dr.B 

## 2019-02-11 NOTE — Progress Notes (Signed)
[facility] NOTE  Patient Care Team: Hubbard Hartshorn, [name] as PCP - General (Family Medicine) Veneda Melter, MD as Consulting Physician (Cardiology) Benedetto Goad, RN as Case Manager  CHIEF COMPLAINTS/PURPOSE OF CONSULTATION: Erythrocytosis  # NOV 2019- SECONDARY Erythrocytosis [negative Jak 2/normal erythropoietin levels; ER/dizzyness.] s/p Phlebotomy x1; on surveillaince  # Hx of mini-stroke/ chronic intermittent mild elevation of bilirubin-question Gilbert's syndrome.  Oncology History   No history exists.     HISTORY OF PRESENTING ILLNESS:  Jeffery Campbell 85 y.o.  male is here for follow-up with likely secondary erythrocytosis is here for follow-up.  Patient has not needed phlebotomy last 1 year or so.  Denies any headaches.  Denies any nausea vomiting denies any strokes.   Review of Systems  Constitutional: Negative for chills, diaphoresis, fever, malaise/fatigue and weight loss.  HENT: Negative for nosebleeds and sore throat.   Eyes: Negative for double vision.  Respiratory: Negative for cough, hemoptysis, sputum production, shortness of breath and wheezing.   Cardiovascular: Negative for chest pain, palpitations, orthopnea and leg swelling.  Gastrointestinal: Negative for abdominal pain, blood in stool, constipation, diarrhea, heartburn, melena, nausea and vomiting.  Genitourinary: Negative for dysuria, frequency and urgency.  Musculoskeletal: Negative for back pain and joint pain.  Skin: Negative.  Negative for itching and rash.  Neurological: Negative for tingling, focal weakness, weakness and headaches.  Endo/Heme/Allergies: Does not bruise/bleed easily.  Psychiatric/Behavioral: Negative for depression. The patient is not nervous/anxious and does not have insomnia.      MEDICAL HISTORY:  Past Medical History:  Diagnosis Date  . Arrhythmia   . Cough    CHRONIC  . Depression   . Disturbance of sleep   . Diverticulitis   . GERD  (gastroesophageal reflux disease)   . Hemorrhoids   . Hypertension   . OA (osteoarthritis)   . Pure hypercholesterolemia   . Sleep apnea   . Stroke [facility])     SURGICAL HISTORY: Past Surgical History:  Procedure Laterality Date  . CATARACT EXTRACTION W/PHACO Left 02/14/2016   Procedure: CATARACT EXTRACTION PHACO AND INTRAOCULAR LENS PLACEMENT (IOC);  Surgeon: Birder Robson, MD;  Location: [facility] ORS;  Service: Ophthalmology;  Laterality: Left;  [place] 53.7 AP% 21.5 CDE 11.58 FLUID PACK LOT # [id number]  . CATARACT EXTRACTION W/PHACO Right 12/18/2018   Procedure: CATARACT EXTRACTION PHACO AND INTRAOCULAR LENS PLACEMENT ([place])  RIGHT, VISION BLUE;  Surgeon: Marchia Meiers, MD;  Location: [facility] ORS;  Service: Ophthalmology;  Laterality: Right;  [place]  01:14 CDE 11.82 Fluid pack lot # [id number]  . COLONOSCOPY    . KNEE ARTHROSCOPY    . TRANSURETHRAL RESECTION OF PROSTATE      SOCIAL HISTORY: Social History   Socioeconomic History  . Marital status: Divorced    Spouse name: Not on file  . Number of children: 3  . Years of education: Not on file  . Highest education level: 11th grade  Occupational History  . Occupation: Retired  Scientific laboratory technician  . Financial resource strain: Not hard at all  . Food insecurity    Worry: Never true    Inability: Never true  . Transportation needs    Medical: No    Non-medical: No  Tobacco Use  . Smoking status: Never Smoker  . Smokeless tobacco: Never Used  . Tobacco comment: smoking cessation materials not required  Substance and Sexual Activity  . Alcohol use: No    Alcohol/week: 0.0 standard drinks  . Drug use: No  . Sexual  activity: Not Currently  Lifestyle  . Physical activity    Days per week: 3 days    Minutes per session: 60 min  . Stress: Not at all  Relationships  . Social Musicianconnections    Talks on phone: Three times a week    Gets together: Twice a week    Attends religious service: More than 4 times per year    Active member of club or  organization: No    Attends meetings of clubs or organizations: Never    Relationship status: Divorced  . Intimate partner violence    Fear of current or ex partner: No    Emotionally abused: No    Physically abused: No    Forced sexual activity: No  Other Topics Concern  . Not on file  Social History Narrative   Lives at home alone.   Writes left-handed - does everything else with right-hand.   No caffeine use.      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     FAMILY HISTORY: Family History  Problem Relation Age of Onset  . Hypertension Son        Posey ReaUnsure of parents health.  . Prostate cancer Neg Hx   . Bladder Cancer Neg Hx   . Kidney cancer Neg Hx     ALLERGIES:  has No Known Allergies.  MEDICATIONS:  Current Outpatient Medications  Medication Sig Dispense Refill  . acetaminophen (TYLENOL) 325 MG tablet Take 325-650 mg by mouth every 6 (six) hours as needed for moderate pain or headache.    . alfuzosin (UROXATRAL) 10 MG 24 hr tablet Take 1 tablet (10 mg total) by mouth daily with breakfast. 90 tablet 3  . atorvastatin (LIPITOR) 20 MG tablet TAKE 1 TABLET BY MOUTH EVERYDAY AT BEDTIME (Patient taking differently: Take 20 mg by mouth daily at 2 PM. ) 90 tablet 1  . clopidogrel (PLAVIX) 75 MG tablet Take 1 tablet (75 mg total) by mouth daily. (stop aggrenox) 90 tablet 3  . Cyanocobalamin (B-12) 3000 MCG SUBL Place 3,000 mcg under the tongue daily.  30 tablet 11  . diltiazem (CARDIZEM CD) 120 MG 24 hr capsule TAKE 1 CAPSULE BY MOUTH EVERY DAY (Patient taking differently: Take 120 mg by mouth daily. ) 90 capsule 1  . docusate sodium (COLACE) 100 MG capsule Take 100 mg by mouth daily as needed for mild constipation.    . famotidine (PEPCID) 20 MG tablet TAKE 1 TABLET (20 MG TOTAL) BY MOUTH 2 (TWO) TIMES DAILY AS NEEDED FOR HEARTBURN OR INDIGESTION. 180 tablet 1  . finasteride (PROSCAR) 5 MG tablet Take 1 tablet  (5 mg total) by mouth daily. 90 tablet 3  . fluticasone (FLONASE) 50 MCG/ACT nasal spray Place 2 sprays into both nostrils daily as needed for allergies.     [name]. meclizine (ANTIVERT) 25 MG tablet TAKE 1/2 TO 1 TABLET (12.5-25 MG TOTAL) BY MOUTH 3 (THREE) TIMES DAILY AS NEEDED FOR DIZZINESS. 30 tablet 2  . Omega 3 1200 MG CAPS Take 1,200 mg by mouth daily.     [name]. Propylene Glycol (SYSTANE BALANCE) 0.6 % SOLN Place 1 drop into both eyes 3 (three) times daily as needed (dry eyes).     No current facility-administered medications for this visit.       [name].  PHYSICAL EXAMINATION: ECOG PERFORMANCE STATUS: 1 - Symptomatic but completely ambulatory  Vitals:   02/11/19 1331  BP: [name](!)  144/69  Pulse: (!) 59  Resp: 20  Temp: (!) 97.1 F (36.2 C)   Filed Weights   02/11/19 1331  Weight: 229 lb (103.9 kg)    Physical Exam  Constitutional: He is oriented to person, place, and time and well-developed, well-nourished, and in no distress.  HENT:  Head: Normocephalic and atraumatic.  Mouth/Throat: Oropharynx is clear and moist. No oropharyngeal exudate.  Eyes: Pupils are equal, round, and reactive to light.  Neck: Normal range of motion. Neck supple.  Cardiovascular: Normal rate and regular rhythm.  Pulmonary/Chest: No respiratory distress. He has no wheezes.  Abdominal: Soft. Bowel sounds are normal. He exhibits no distension and no mass. There is no abdominal tenderness. There is no rebound and no guarding.  Musculoskeletal: Normal range of motion.        General: No tenderness or edema.  Neurological: He is alert and oriented to person, place, and time.  Skin: Skin is warm.  Psychiatric: Affect normal.     LABORATORY DATA:  I have reviewed the data as listed Lab Results  Component Value Date   WBC 6.7 02/11/2019   HGB 16.8 02/11/2019   HCT 48.9 02/11/2019   MCV 90.2 02/11/2019   PLT 211 02/11/2019   Recent Labs    02/24/18 1144 02/11/19 1251  NA 140 139  K 4.0 4.1  CL 107 109   CO2 26 24  GLUCOSE 110* 100*  BUN 11 14  CREATININE 1.12 1.15  CALCIUM 9.1 8.9  GFRNONAA 57* 57*  GFRAA >60 >60  PROT 7.0  --   ALBUMIN 4.1  --   AST 17  --   ALT 14  --   ALKPHOS 48  --   BILITOT 1.6*  --     RADIOGRAPHIC STUDIES: I have personally reviewed the radiological images as listed and agreed with the findings in the report. No results found.  ASSESSMENT & PLAN:   Erythrocytosis # Erythrocytosis isolated-unclear etiology/however SECONDARY -negative Jak 2/normal erythropoietin levels.  #Today hemoglobin 17; hematocrit  48; patient is asymptomatic hold off phlebotomy.  # HTN- STABLE.  # DISPOSITION: # NO phelbotomy today # Follow up in 12 months/MD/lab- cbc/bmp/possible Phlebtotomy- Dr.B  All questions were answered. The patient knows to call the clinic with any problems, questions or concerns.   Earna Coder, MD 02/11/2019 4:08 PM

## 2019-02-27 DIAGNOSIS — D751 Secondary polycythemia: Secondary | ICD-10-CM | POA: Diagnosis not present

## 2019-02-27 DIAGNOSIS — G4733 Obstructive sleep apnea (adult) (pediatric): Secondary | ICD-10-CM | POA: Diagnosis not present

## 2019-02-27 DIAGNOSIS — G4736 Sleep related hypoventilation in conditions classified elsewhere: Secondary | ICD-10-CM | POA: Diagnosis not present

## 2019-03-06 ENCOUNTER — Telehealth: Payer: Self-pay | Admitting: Family Medicine

## 2019-03-06 NOTE — Telephone Encounter (Signed)
Pt is taking super beta prostate 250 mg over the counter and this med is helping him with bladder leakage and he would like rx or something similar  to be sent to [address] in [place]

## 2019-03-09 NOTE — Telephone Encounter (Signed)
He needs to schedule follow up with Carlis Stable with urology to discuss medications.

## 2019-03-09 NOTE — Telephone Encounter (Signed)
FYI

## 2019-03-10 ENCOUNTER — Telehealth: Payer: Self-pay | Admitting: Urology

## 2019-03-10 NOTE — Telephone Encounter (Signed)
Pt called and would like to know if he can take OTC Super Beta Prostate pills.

## 2019-03-10 NOTE — Telephone Encounter (Signed)
Spoke to patient and informed him that it is ok to take the prostate pill OTC

## 2019-03-10 NOTE — Telephone Encounter (Signed)
Patient notified

## 2019-03-13 ENCOUNTER — Encounter: Payer: Self-pay | Admitting: Family Medicine

## 2019-03-13 ENCOUNTER — Other Ambulatory Visit: Payer: Self-pay

## 2019-03-13 ENCOUNTER — Ambulatory Visit (INDEPENDENT_AMBULATORY_CARE_PROVIDER_SITE_OTHER): Payer: PPO | Admitting: Family Medicine

## 2019-03-13 DIAGNOSIS — F419 Anxiety disorder, unspecified: Secondary | ICD-10-CM | POA: Diagnosis not present

## 2019-03-13 DIAGNOSIS — F3341 Major depressive disorder, recurrent, in partial remission: Secondary | ICD-10-CM | POA: Diagnosis not present

## 2019-03-13 MED ORDER — BUSPIRONE HCL 5 MG PO TABS: 5.0000 mg | ORAL_TABLET | Freq: Two times a day (BID) | ORAL | 1 refills | Status: DC | PRN

## 2019-03-13 NOTE — Progress Notes (Signed)
Name: Jeffery Campbell   MRN: [id number]    DOB: 05-30-1928   Date:03/13/2019       Progress Note  Subjective  Chief Complaint  Chief Complaint  Patient presents with  . Anxiety    I connected with  Jeffery Campbell on 03/13/19 at  1:40 PM EST by telephone and verified that I am speaking with the correct person using two identifiers.   I discussed the limitations, risks, security and privacy concerns of performing an evaluation and management service by telephone and the availability of in person appointments. Staff also discussed with the patient that there may be a patient responsible charge related to this service. Patient Location: Home Provider Location: Office Additional Individuals present: None  HPI  Pt presents with concern for anxiety and depression - has dealt with on and off for many years.  He notes his depression has been fairly well controlled, but his anxiety has been high since COVID-19 pandemic has become so prolonged and he is not able to be out and about as much.  He notes he used to take PRN anxiety medication, cannot remember the name.  I discussed benzo's and the research showing that they are not particularly safe, especially for his advanced age and health status.  He verbalizes understanding.  He does not want to return to Celexa or other daily medication.  Discussed PRN buspar at 5mg  as an option and he is agreeable.  I will rx for him today - follow up in 2-3 weeks if not improving.  Depression screen [facility] 2/9 03/13/2019 01/21/2019 11/26/2018 10/28/2018 10/08/2018  Decreased Interest 0 0 0 0 0  Down, Depressed, Hopeless 0 0 0 1 0  PHQ - 2 Score 0 0 0 1 0  Altered sleeping 0 0 0 0 0  Tired, decreased energy 0 0 0 0 0  Change in appetite 0 0 0 0 0  Feeling bad or failure about yourself  0 0 0 0 0  Trouble concentrating 0 0 0 0 0  Moving slowly or fidgety/restless 0 0 0 0 0  Suicidal thoughts 0 0 0 0 0  PHQ-9 Score 0 0 0 1 0  Difficult doing work/chores Not difficult at  all Not difficult at all Not difficult at all Not difficult at all Not difficult at all  Some recent data might be hidden   PHQ-9 is negative  GAD 7 : Generalized Anxiety Score 03/13/2019 07/21/2018 05/26/2018  Nervous, Anxious, on Edge 1 0 0  Control/stop worrying 1 0 0  Worry too much - different things 1 0 0  Trouble relaxing 1 0 0  Restless 1 0 0  Easily annoyed or irritable 1 0 0  Afraid - awful might happen 1 0 0  Total GAD 7 Score 7 0 0  Anxiety Difficulty Somewhat difficult Not difficult at all Not difficult at all  GAD-7 is elevated.  Patient Active Problem List   Diagnosis Date Noted  . Recurrent major depressive disorder, in partial remission ([place]) 10/08/2018  . Erythrocytosis 01/31/2018  . Hx of completed stroke 01/10/2018  . Prediabetes 10/07/2017  . Dizziness 07/03/2017  . Abnormal gait 10/05/2016  . Hypoxia, sleep related 04/20/2016  . Elevated hematocrit 04/04/2016  . Right flank pain 01/10/2016  . General unsteadiness 05/24/2015  . Allergic rhinitis due to pollen 11/12/2014  . Back pain, chronic 10/21/2014  . Benign paroxysmal positional nystagmus 10/21/2014  . Decreased libido 10/21/2014  . Fatigue 10/21/2014  . Gastro-esophageal reflux disease without  esophagitis 10/21/2014  . Dysmetabolic syndrome 10/21/2014  . Fungal infection of nail 10/21/2014  . HLD (hyperlipidemia) 08/19/2014  . CKD (chronic kidney disease) stage 3, GFR 30-59 ml/min 08/19/2014  . Obesity   . Asymptomatic PVCs 05/27/2014  . Essential hypertension 04/05/2014  . Osteoarthrosis involving more than one site but not generalized 08/03/2008    Social History   Tobacco Use  . Smoking status: Never Smoker  . Smokeless tobacco: Never Used  . Tobacco comment: smoking cessation materials not required  Substance Use Topics  . Alcohol use: No    Alcohol/week: 0.0 standard drinks     Current Outpatient Medications:  .  acetaminophen (TYLENOL) 325 MG tablet, Take 325-650 mg by mouth  every 6 (six) hours as needed for moderate pain or headache., Disp: , Rfl:  .  alfuzosin (UROXATRAL) 10 MG 24 hr tablet, Take 1 tablet (10 mg total) by mouth daily with breakfast., Disp: 90 tablet, Rfl: 3 .  atorvastatin (LIPITOR) 20 MG tablet, TAKE 1 TABLET BY MOUTH EVERYDAY AT BEDTIME (Patient taking differently: Take 20 mg by mouth daily at 2 PM. ), Disp: 90 tablet, Rfl: 1 .  clopidogrel (PLAVIX) 75 MG tablet, Take 1 tablet (75 mg total) by mouth daily. (stop aggrenox), Disp: 90 tablet, Rfl: 3 .  Cyanocobalamin (B-12) 3000 MCG SUBL, Place 3,000 mcg under the tongue daily. , Disp: 30 tablet, Rfl: 11 .  diltiazem (CARDIZEM CD) 120 MG 24 hr capsule, TAKE 1 CAPSULE BY MOUTH EVERY DAY (Patient taking differently: Take 120 mg by mouth daily. ), Disp: 90 capsule, Rfl: 1 .  docusate sodium (COLACE) 100 MG capsule, Take 100 mg by mouth daily as needed for mild constipation., Disp: , Rfl:  .  famotidine (PEPCID) 20 MG tablet, TAKE 1 TABLET (20 MG TOTAL) BY MOUTH 2 (TWO) TIMES DAILY AS NEEDED FOR HEARTBURN OR INDIGESTION., Disp: 180 tablet, Rfl: 1 .  finasteride (PROSCAR) 5 MG tablet, Take 1 tablet (5 mg total) by mouth daily., Disp: 90 tablet, Rfl: 3 .  fluticasone (FLONASE) 50 MCG/ACT nasal spray, Place 2 sprays into both nostrils daily as needed for allergies. , Disp: , Rfl:  .  meclizine (ANTIVERT) 25 MG tablet, TAKE 1/2 TO 1 TABLET (12.5-25 MG TOTAL) BY MOUTH 3 (THREE) TIMES DAILY AS NEEDED FOR DIZZINESS., Disp: 30 tablet, Rfl: 2 .  Omega 3 1200 MG CAPS, Take 1,200 mg by mouth daily. , Disp: , Rfl:  .  Propylene Glycol (SYSTANE BALANCE) 0.6 % SOLN, Place 1 drop into both eyes 3 (three) times daily as needed (dry eyes)., Disp: , Rfl:   No Known Allergies  I personally reviewed active problem list, medication list, allergies, notes from last encounter, lab results with the patient/caregiver today.  ROS  Constitutional: Negative for fever or weight change.  Respiratory: Negative for cough and  shortness of breath.   Cardiovascular: Negative for chest pain or palpitations.  Gastrointestinal: Negative for abdominal pain, no bowel changes.  Musculoskeletal: Negative for gait problem or joint swelling.  Skin: Negative for rash.  Neurological: Negative for dizziness or headache.  No other specific complaints in a complete review of systems (except as listed in HPI above).  Objective  Virtual encounter, vitals not obtained.  There is no height or weight on file to calculate BMI.  Nursing Note and Vital Signs reviewed.  Physical Exam  Constitutional: Patient appears well-developed and well-nourished. No distress.  HENT: Head: Normocephalic and atraumatic.  Neck: Normal range of motion. Pulmonary/Chest: Effort normal. No  respiratory distress. Speaking in complete sentences Neurological: Pt is alert and oriented to person, place, and time. Coordination, speech and gait are normal.  Psychiatric: Patient has a slightly anxious mood and affect. behavior is normal. Judgment and thought content normal.   No results found for this or any previous visit (from the past 72 hour(s)).  Assessment & Plan  1. Recurrent major depressive disorder, in partial remission (HCC) - Does not want daily medication at this time.   2. Anxiety - Follow up in 2-3 weeks if not improving with PRN buspar. - busPIRone (BUSPAR) 5 MG tablet; Take 1 tablet (5 mg total) by mouth 2 (two) times daily as needed.  Dispense: 60 tablet; Refill: 1  -Red flags and when to present for emergency care or RTC including fever >101.19F, chest pain, shortness of breath, new/worsening/un-resolving symptoms, reviewed with patient at time of visit. Follow up and care instructions discussed and provided in AVS. - I discussed the assessment and treatment plan with the patient. The patient was provided an opportunity to ask questions and all were answered. The patient agreed with the plan and demonstrated an understanding of the  instructions.  - The patient was advised to call back or seek an in-person evaluation if the symptoms worsen or if the condition fails to improve as anticipated.  I provided 10 minutes of non-face-to-face time during this encounter.  Doren Custard, [name]

## 2019-03-19 ENCOUNTER — Other Ambulatory Visit: Payer: Self-pay | Admitting: Family Medicine

## 2019-03-19 DIAGNOSIS — E782 Mixed hyperlipidemia: Secondary | ICD-10-CM

## 2019-03-25 ENCOUNTER — Telehealth: Payer: Self-pay | Admitting: Family Medicine

## 2019-03-25 NOTE — Telephone Encounter (Signed)
Pt called to see if [facility] for a runny nose/ Please advise

## 2019-03-26 NOTE — Telephone Encounter (Signed)
If having cold symptoms, will need to be seen for virtual visit to evaluate further.  He is prescribed flonase which can help with runny nose.

## 2019-03-26 NOTE — Telephone Encounter (Signed)
Anything OTC he can take for runnynose

## 2019-03-29 DIAGNOSIS — D751 Secondary polycythemia: Secondary | ICD-10-CM | POA: Diagnosis not present

## 2019-03-29 DIAGNOSIS — G4736 Sleep related hypoventilation in conditions classified elsewhere: Secondary | ICD-10-CM | POA: Diagnosis not present

## 2019-03-29 DIAGNOSIS — G4733 Obstructive sleep apnea (adult) (pediatric): Secondary | ICD-10-CM | POA: Diagnosis not present

## 2019-03-30 NOTE — Telephone Encounter (Signed)
Patient stated he is feeling much better

## 2019-04-03 DIAGNOSIS — D751 Secondary polycythemia: Secondary | ICD-10-CM | POA: Diagnosis not present

## 2019-04-03 DIAGNOSIS — G4736 Sleep related hypoventilation in conditions classified elsewhere: Secondary | ICD-10-CM | POA: Diagnosis not present

## 2019-04-03 DIAGNOSIS — G4733 Obstructive sleep apnea (adult) (pediatric): Secondary | ICD-10-CM | POA: Diagnosis not present

## 2019-04-05 ENCOUNTER — Other Ambulatory Visit: Payer: Self-pay | Admitting: Family Medicine

## 2019-04-05 DIAGNOSIS — F419 Anxiety disorder, unspecified: Secondary | ICD-10-CM

## 2019-04-09 ENCOUNTER — Other Ambulatory Visit: Payer: Self-pay | Admitting: Family Medicine

## 2019-04-09 NOTE — Telephone Encounter (Signed)
Requested medication (s) are due for refill today: yes  Requested medication (s) are on the active medication list: yes  Last refill:  01/20/19  Future visit scheduled: yes  Notes to clinic:  Not Delegated    Requested Prescriptions  Pending Prescriptions Disp Refills   meclizine (ANTIVERT) 25 MG tablet [Pharmacy Med Name: MECLIZINE 25 MG TABLET] 30 tablet 2    Sig: TAKE 1/2 TO 1 TABLET (12.5-25 MG TOTAL) BY MOUTH 3 (THREE) TIMES DAILY AS NEEDED FOR DIZZINESS.      Not Delegated - Gastroenterology: Antiemetics Failed - 04/09/2019  4:53 PM      Failed - This refill cannot be delegated      Passed - Valid encounter within last 6 months    Recent Outpatient Visits           3 weeks ago Anxiety   [facility] Doren Custard, [name]   2 months ago Urinary frequency   [facility] Doren Custard, [name]   4 months ago Frequency of urination   [facility] Sharyon Cable E, NP   6 months ago Benign prostatic hyperplasia, unspecified whether lower urinary tract symptoms present   [facility] Doren Custard, [name]   8 months ago Benign prostatic hyperplasia with post-void dribbling   [facility] [place], Percell Belt, NP       Future Appointments             Tomorrow Danelle Berry, PA-C [facility]   In 5 months [name], Elana Alm [facility]   In 6 months  [facility]

## 2019-04-10 ENCOUNTER — Ambulatory Visit (INDEPENDENT_AMBULATORY_CARE_PROVIDER_SITE_OTHER): Payer: PPO | Admitting: Family Medicine

## 2019-04-10 ENCOUNTER — Other Ambulatory Visit: Payer: Self-pay

## 2019-04-10 ENCOUNTER — Other Ambulatory Visit: Payer: Self-pay | Admitting: Family Medicine

## 2019-04-10 ENCOUNTER — Encounter: Payer: Self-pay | Admitting: Family Medicine

## 2019-04-10 VITALS — Ht 73.0 in | Wt 239.0 lb

## 2019-04-10 DIAGNOSIS — F3341 Major depressive disorder, recurrent, in partial remission: Secondary | ICD-10-CM

## 2019-04-10 DIAGNOSIS — N400 Enlarged prostate without lower urinary tract symptoms: Secondary | ICD-10-CM

## 2019-04-10 DIAGNOSIS — F419 Anxiety disorder, unspecified: Secondary | ICD-10-CM

## 2019-04-10 DIAGNOSIS — E782 Mixed hyperlipidemia: Secondary | ICD-10-CM | POA: Diagnosis not present

## 2019-04-10 DIAGNOSIS — I129 Hypertensive chronic kidney disease with stage 1 through stage 4 chronic kidney disease, or unspecified chronic kidney disease: Secondary | ICD-10-CM | POA: Diagnosis not present

## 2019-04-10 DIAGNOSIS — I10 Essential (primary) hypertension: Secondary | ICD-10-CM

## 2019-04-10 DIAGNOSIS — N183 Chronic kidney disease, stage 3 unspecified: Secondary | ICD-10-CM | POA: Diagnosis not present

## 2019-04-10 DIAGNOSIS — Z8673 Personal history of transient ischemic attack (TIA), and cerebral infarction without residual deficits: Secondary | ICD-10-CM

## 2019-04-10 NOTE — Telephone Encounter (Signed)
Copied from CRM [id number]. Topic: Quick Communication - Rx Refill/Question >> Apr 10, 2019  4:32 PM Marylen Ponto wrote: Medication: meclizine (ANTIVERT) 25 MG tablet  Has the patient contacted their pharmacy? yes   Preferred Pharmacy (with phone number or street name): [id number] [place], [place] - [address] Phone: [phone]  Fax: [phone]  Agent: Please be advised that RX refills may take up to 3 business days. We ask that you follow-up with your pharmacy.

## 2019-04-10 NOTE — Progress Notes (Signed)
Name: Jeffery Campbell   MRN: [id number]    DOB: [birth year]   Date:04/10/2019       Progress Note  Subjective:    Chief Complaint  Chief Complaint  Patient presents with  . Nasal Congestion    sneezing a lot   I connected with  [name] on 04/10/19 at  9:00 AM EST by telephone and verified that I am speaking with the correct person using two identifiers.  I discussed the limitations, risks, security and privacy concerns of performing an evaluation and management service by telephone and the availability of in person appointments. Staff also discussed with the patient that there may be a patient responsible charge related to this service. Patient Location: home Provider Location: [facility] [place] clinic Additional Individuals present: none  HPI  Pt has routine f/up today- mentioned some nasal sx to nurse - see below  Hyperlipidemia: Current Medication Regimen:   lipitor 20 mg Last Lipids: Lab Results  Component Value Date   CHOL 131 04/09/2018   HDL 41 04/09/2018   LDLCALC 71 04/09/2018   TRIG 102 04/09/2018   CHOLHDL 3.2 04/09/2018  - Current Diet: healthy - Denies: Chest pain, shortness of breath, myalgias. - Documented aortic atherosclerosis? Not per review  Hypertension:  Currently managed on cardizem 120 mg  Pt reports good med compliance and denies any SE.  No lightheadedness, hypotension, syncope. Blood pressure today is well controlled -  BP Readings from Last 3 Encounters:  02/11/19 (!) 144/69  12/18/18 136/61  09/24/18 (!) 145/80  Pt denies CP, SOB, exertional sx, LE edema, palpitation, Ha's, visual disturbances Dietary efforts for BP?  Healthy, low salt  Anxiety and depression:  Recently started Buspar 5 mg BID with Irving Burton, dose once a day seems to be helping but he does seem a little  Pt states it his mood is good,   BPH LUTs - seeing urology Carollee Herter    Patient Active Problem List   Diagnosis Date Noted  . Recurrent major depressive disorder, in partial remission  (HCC) 10/08/2018  . Erythrocytosis 01/31/2018  . Hx of completed stroke 01/10/2018  . Prediabetes 10/07/2017  . Dizziness 07/03/2017  . Abnormal gait 10/05/2016  . Hypoxia, sleep related 04/20/2016  . Elevated hematocrit 04/04/2016  . Right flank pain 01/10/2016  . General unsteadiness 05/24/2015  . Allergic rhinitis due to pollen 11/12/2014  . Back pain, chronic 10/21/2014  . Benign paroxysmal positional nystagmus 10/21/2014  . Decreased libido 10/21/2014  . Fatigue 10/21/2014  . Gastro-esophageal reflux disease without esophagitis 10/21/2014  . Dysmetabolic syndrome 10/21/2014  . Fungal infection of nail 10/21/2014  . HLD (hyperlipidemia) 08/19/2014  . CKD (chronic kidney disease) stage 3, GFR 30-59 ml/min 08/19/2014  . Obesity   . Asymptomatic PVCs 05/27/2014  . Essential hypertension 04/05/2014  . Osteoarthrosis involving more than one site but not generalized 08/03/2008    Current Outpatient Medications:  .  acetaminophen (TYLENOL) 325 MG tablet, Take 325-650 mg by mouth every 6 (six) hours as needed for moderate pain or headache., Disp: , Rfl:  .  alfuzosin (UROXATRAL) 10 MG 24 hr tablet, Take 1 tablet (10 mg total) by mouth daily with breakfast., Disp: 90 tablet, Rfl: 3 .  atorvastatin (LIPITOR) 20 MG tablet, TAKE 1 TABLET BY MOUTH EVERYDAY AT BEDTIME, Disp: 90 tablet, Rfl: 1 .  busPIRone (BUSPAR) 5 MG tablet, Take 1 tablet (5 mg total) by mouth 2 (two) times daily as needed., Disp: 60 tablet, Rfl: 1 .  clopidogrel (PLAVIX) 75  MG tablet, Take 1 tablet (75 mg total) by mouth daily. (stop aggrenox), Disp: 90 tablet, Rfl: 3 .  Cyanocobalamin (B-12) 3000 MCG SUBL, Place 3,000 mcg under the tongue daily. , Disp: 30 tablet, Rfl: 11 .  diltiazem (CARDIZEM CD) 120 MG 24 hr capsule, TAKE 1 CAPSULE BY MOUTH EVERY DAY (Patient taking differently: Take 120 mg by mouth daily. ), Disp: 90 capsule, Rfl: 1 .  docusate sodium (COLACE) 100 MG capsule, Take 100 mg by mouth daily as needed for  mild constipation., Disp: , Rfl:  .  famotidine (PEPCID) 20 MG tablet, TAKE 1 TABLET (20 MG TOTAL) BY MOUTH 2 (TWO) TIMES DAILY AS NEEDED FOR HEARTBURN OR INDIGESTION., Disp: 180 tablet, Rfl: 1 .  finasteride (PROSCAR) 5 MG tablet, Take 1 tablet (5 mg total) by mouth daily., Disp: 90 tablet, Rfl: 3 .  fluticasone (FLONASE) 50 MCG/ACT nasal spray, Place 2 sprays into both nostrils daily as needed for allergies. , Disp: , Rfl:  .  meclizine (ANTIVERT) 25 MG tablet, TAKE 1/2 TO 1 TABLET (12.5-25 MG TOTAL) BY MOUTH 3 (THREE) TIMES DAILY AS NEEDED FOR DIZZINESS., Disp: 30 tablet, Rfl: 2 .  Omega 3 1200 MG CAPS, Take 1,200 mg by mouth daily. , Disp: , Rfl:  .  Propylene Glycol (SYSTANE BALANCE) 0.6 % SOLN, Place 1 drop into both eyes 3 (three) times daily as needed (dry eyes)., Disp: , Rfl:  No Known Allergies  Past Surgical History:  Procedure Laterality Date  . CATARACT EXTRACTION W/PHACO Left 02/14/2016   Procedure: CATARACT EXTRACTION PHACO AND INTRAOCULAR LENS PLACEMENT (IOC);  Surgeon: Galen Manila, MD;  Location: [facility] ORS;  Service: Ophthalmology;  Laterality: Left;  [place] 53.7 AP% 21.5 CDE 11.58 FLUID PACK LOT # [id number]  . CATARACT EXTRACTION W/PHACO Right 12/18/2018   Procedure: CATARACT EXTRACTION PHACO AND INTRAOCULAR LENS PLACEMENT (IOC)  RIGHT, VISION BLUE;  Surgeon: Elliot Cousin, MD;  Location: [facility] ORS;  Service: Ophthalmology;  Laterality: Right;  [place]  01:14 CDE 11.82 Fluid pack lot # [id number]  . COLONOSCOPY    . KNEE ARTHROSCOPY    . TRANSURETHRAL RESECTION OF PROSTATE     Family History  Problem Relation Age of Onset  . Hypertension Son        Posey Rea of parents health.  . Prostate cancer Neg Hx   . Bladder Cancer Neg Hx   . Kidney cancer Neg Hx    Social History   Socioeconomic History  . Marital status: Divorced    Spouse name: Not on file  . Number of children: 3  . Years of education: Not on file  . Highest education level: 11th grade  Occupational History  .  Occupation: Retired  Tobacco Use  . Smoking status: Never Smoker  . Smokeless tobacco: Never Used  . Tobacco comment: smoking cessation materials not required  Substance and Sexual Activity  . Alcohol use: No    Alcohol/week: 0.0 standard drinks  . Drug use: No  . Sexual activity: Not Currently  Other Topics Concern  . Not on file  Social History Narrative   Lives at home alone.   Writes left-handed - does everything else with right-hand.   No caffeine use.      Never smoker; never alcohol; used to be truck driver; retired part time from Systems analyst course. 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    Social Determinants of Health   Financial Resource Strain:   . Difficulty of Paying Living Expenses: Not on file  Food Insecurity:   .  Worried About Charity fundraiser in the Last Year: Not on file  . Ran Out of Food in the Last Year: Not on file  Transportation Needs:   . Lack of Transportation (Medical): Not on file  . Lack of Transportation (Non-Medical): Not on file  Physical Activity:   . Days of Exercise per Week: Not on file  . Minutes of Exercise per Session: Not on file  Stress:   . Feeling of Stress : Not on file  Social Connections:   . Frequency of Communication with Friends and Family: Not on file  . Frequency of Social Gatherings with Friends and Family: Not on file  . Attends Religious Services: Not on file  . Active Member of Clubs or Organizations: Not on file  . Attends Archivist Meetings: Not on file  . Marital Status: Not on file  Intimate Partner Violence:   . Fear of Current or Ex-Partner: Not on file  . Emotionally Abused: Not on file  . Physically Abused: Not on file  . Sexually Abused: Not on file    Chart Review Today: I personally reviewed active problem list, medication list, allergies, family history, social history, health maintenance, notes from last encounter, lab results, imaging with the patient/caregiver today.   Review of Systems  Constitutional:  Negative.   HENT: Negative.   Eyes: Negative.   Respiratory: Negative.   Cardiovascular: Negative.   Gastrointestinal: Negative.   Endocrine: Negative.   Genitourinary: Negative.   Musculoskeletal: Negative.   Skin: Negative.   Allergic/Immunologic: Negative.   Neurological: Negative.   Hematological: Negative.   Psychiatric/Behavioral: Negative.   All other systems reviewed and are negative.    Objective:    Virtual encounter, vitals limited, only able to obtain the following: Today's Vitals   04/10/19 0821  Weight: 239 lb (108.4 kg)  Height: 6\' 1"  (1.854 m)   Body mass index is 31.53 kg/m. Nursing Note and Vital Signs reviewed.  Physical Exam Alert, good mood, answering questions appropriately, no audible wheeze stridor or tachypnea PE limited by telephone encounter   PHQ2/9: Depression screen [facility] 2/9 04/10/2019 03/13/2019 01/21/2019 11/26/2018 10/28/2018  Decreased Interest 0 0 0 0 0  Down, Depressed, Hopeless 0 0 0 0 1  PHQ - 2 Score 0 0 0 0 1  Altered sleeping 0 0 0 0 0  Tired, decreased energy 0 0 0 0 0  Change in appetite 0 0 0 0 0  Feeling bad or failure about yourself  0 0 0 0 0  Trouble concentrating 0 0 0 0 0  Moving slowly or fidgety/restless 0 0 0 0 0  Suicidal thoughts 0 0 0 0 0  PHQ-9 Score 0 0 0 0 1  Difficult doing work/chores Not difficult at all Not difficult at all Not difficult at all Not difficult at all Not difficult at all  Some recent data might be hidden   PHQ-2/9 Result is negative, reviewed today  Fall Risk: Fall Risk  04/10/2019 03/13/2019 01/21/2019 11/26/2018 10/28/2018  Falls in the past year? 0 0 0 0 0  Number falls in past yr: 0 0 0 0 0  Injury with Fall? 0 0 0 0 0  Risk for fall due to : - - - - -  Risk for fall due to: Comment - - - - -  Follow up - Falls evaluation completed Falls evaluation completed Falls evaluation completed Falls prevention discussed     Assessment and Plan:  ICD-10-CM   1. Mixed hyperlipidemia   E78.2   2. Stage 3 chronic kidney disease, unspecified whether stage 3a or 3b CKD  N18.30    last labs GFR >60, improved  3. Essential hypertension  I10    well controlled SBP 130-140's, no sx or se, vitals and labs with next visit due to COVID surge  4. Hx of completed stroke  Z86.73    on plavix  5. Anxiety  F41.9    improved with buspar - taking once a day  6. Benign prostatic hyperplasia, unspecified whether lower urinary tract symptoms present  N40.0    per urology, pt states well controlled  7. Recurrent major depressive disorder, in partial remission (HCC)  F33.41    doing well with buspar, mood good, no anxiety, SI, HI, AVH, aggitation     I discussed the assessment and treatment plan with the patient. The patient was provided an opportunity to ask questions and all were answered. The patient agreed with the plan and demonstrated an understanding of the instructions.   The patient was advised to call back or seek an in-person evaluation if the symptoms worsen or if the condition fails to improve as anticipated.  I provided 10 minutes of non-face-to-face time during this encounter.  Danelle Berry, PA-C 04/10/19 10:13 AM

## 2019-04-10 NOTE — Telephone Encounter (Signed)
   Notes to clinic:  Unable to refill. Refill not delegated per protocol.     Requested Prescriptions  Pending Prescriptions Disp Refills   meclizine (ANTIVERT) 25 MG tablet 30 tablet 2    Sig: TAKE 1/2 TO 1 TABLET (12.5-25 MG TOTAL) BY MOUTH 3 (THREE) TIMES DAILY AS NEEDED FOR DIZZINESS.      Not Delegated - Gastroenterology: Antiemetics Failed - 04/10/2019  5:19 PM      Failed - This refill cannot be delegated      Passed - Valid encounter within last 6 months    Recent Outpatient Visits           Today Mixed hyperlipidemia   [facility] Danelle Berry, PA-C   4 weeks ago Anxiety   [facility] Doren Custard, [name]   2 months ago Urinary frequency   [facility] Doren Custard, [name]   4 months ago Frequency of urination   [facility] Sharyon Cable E, NP   6 months ago Benign prostatic hyperplasia, unspecified whether lower urinary tract symptoms present   [facility] Doren Custard, [name]       Future Appointments             In 3 months Danelle Berry, PA-C [facility]   In 5 months [name], Elana Alm [facility]   In 6 months  [facility]

## 2019-04-13 ENCOUNTER — Other Ambulatory Visit: Payer: Self-pay | Admitting: Family Medicine

## 2019-04-13 NOTE — Telephone Encounter (Signed)
Pt called in again about this refill. He would like to be advise once this has been called in .  [facility]

## 2019-04-13 NOTE — Telephone Encounter (Signed)
Requested Prescriptions  Pending Prescriptions Disp Refills  . diltiazem (CARDIZEM CD) 120 MG 24 hr capsule [Pharmacy Med Name: DILTIAZEM 24H ER(CD) 120 MG CP] 90 capsule 1    Sig: TAKE 1 CAPSULE BY MOUTH EVERY DAY     Cardiovascular:  Calcium Channel Blockers Failed - 04/13/2019 11:29 AM      Failed - Last BP in normal range    BP Readings from Last 1 Encounters:  02/11/19 (!) 144/69         Passed - Valid encounter within last 6 months    Recent Outpatient Visits          3 days ago Mixed hyperlipidemia   [facility] Danelle Berry, PA-C   1 month ago Anxiety   [facility] [place], Gerome Apley, [name]   2 months ago Urinary frequency   [facility] Doren Custard, [name]   4 months ago Frequency of urination   [facility] Sharyon Cable E, NP   6 months ago Benign prostatic hyperplasia, unspecified whether lower urinary tract symptoms present   [facility] Doren Custard, [place]      Future Appointments            In 2 months Danelle Berry, PA-C [facility]   In 5 months [name], [facility]   In 6 months  [facility]

## 2019-04-13 NOTE — Telephone Encounter (Signed)
Order sent to Emily 

## 2019-04-13 NOTE — Telephone Encounter (Signed)
Order sent to provider for refills

## 2019-04-14 NOTE — Telephone Encounter (Signed)
Pt calling again for the  meclizine (ANTIVERT) 25 MG tablet  Pt states he likes to keep this medicine on hand.  He states he had to take last night.  t does not want to wait until he has a problem to call for this med. Pt states the other dr here used to prescribe for him.  Pt wants call when this is done.

## 2019-04-15 ENCOUNTER — Other Ambulatory Visit: Payer: Self-pay | Admitting: Family Medicine

## 2019-04-15 NOTE — Telephone Encounter (Signed)
P t sees Annye Asa is his doctor but did phone visit with Angelica Chessman on 04-10-2019 but forgot to tell her that he needs refill on MECLIZINE 25MG . Pt is out and said that he really needs this.                                                               Pharm is [facility] .

## 2019-04-16 ENCOUNTER — Ambulatory Visit: Payer: Self-pay | Admitting: *Deleted

## 2019-04-16 NOTE — Telephone Encounter (Signed)
Patient requesting time lapse after receiving the Covid 19 vaccine and when he can receive the flu vaccine. Provided information to patient, wait 10 days between vaccines.  Reason for Disposition . Health Information question, no triage required and triager able to answer question  Answer Assessment - Initial Assessment Questions 1. REASON FOR CALL or QUESTION: "What is your reason for calling today?" or "How can I best help you?" or "What question do you have that I can help answer?"    Flu and covid 19 vaccine questions.  Protocols used: INFORMATION ONLY CALL - NO TRIAGE-A-AH

## 2019-04-17 NOTE — Telephone Encounter (Signed)
Left detailed voicemail

## 2019-04-24 ENCOUNTER — Ambulatory Visit: Payer: Self-pay

## 2019-04-24 NOTE — Telephone Encounter (Signed)
Spoke to patient and per Provider Irving Burton may continue the Miralax OTC and take each day

## 2019-04-24 NOTE — Telephone Encounter (Signed)
Out going call to Patient .  Patient complains of having a stool for a day.  Patient states that he normally has a stool every day. Patient denies pain.  States stool is half way hard.    Denies blood in or on stool.   No new changes of diet.    No use of laxatives.  Denies any other Sx.  Patient request that Rx be called ing.  Patient states that he  a does drink a loto of water.   Explained today that he really need to increase 6 glasses of water.  Recommended that he would try oatmeal too.  Patient voiced  Understanding.  Pt would prefer a medication be called in and a return  Phone call.            Reason for Disposition . Mild constipation  Answer Assessment - Initial Assessment Questions 1. STOOL PATTERN OR FREQUENCY: "How often do you pass bowel movements (BMs)?"  (Normal range: tid to q 3 days)  "When was the last BM passed?"       1 sool a day 2. STRAINING: "Do you have to strain to have a BM?"      Some tim 3. RECTAL PAIN: "Does your rectum hurt when the stool comes out?" If so, ask: "Do you have hemorrhoids? How bad is the pain?"  (Scale 1-10; or mild, moderate, severe)    denies 4. STOOL COMPOSITION: "Are the stools hard?"     Half waf hard 5. BLOOD ON STOOLS: "Has there been any blood on the toilet tissue or on the surface of the BM?" If so, ask: "When was the last time?"     denies 6. CHRONIC CONSTIPATION: "Is this a new problem for you?"  If no, ask: "How long have you had this problem?" (days, weeks, months)     no 7. CHANGES IN DIET: "Have there been any recent changes in your diet?"     denies8. MEDICATIONS: "Have you been taking any new medications?"    denis 9. LAXATIVES: "Have you been using any laxatives or enemas?"  If yes, ask "What, how often, and when was the last time?"      10. CAUSE: "What do you think is causing the constipation?"        *No Answer* 11. OTHER SYMPTOMS: "Do you have any other symptoms?" (e.g., abdominal pain, fever, vomiting)        denies 12. PREGNANCY: "Is there any chance you are pregnant?" "When was your last menstrual period?"       na  Protocols used: CONSTIPATION-A-AH

## 2019-04-29 DIAGNOSIS — G4733 Obstructive sleep apnea (adult) (pediatric): Secondary | ICD-10-CM | POA: Diagnosis not present

## 2019-04-29 DIAGNOSIS — D751 Secondary polycythemia: Secondary | ICD-10-CM | POA: Diagnosis not present

## 2019-04-29 DIAGNOSIS — G4736 Sleep related hypoventilation in conditions classified elsewhere: Secondary | ICD-10-CM | POA: Diagnosis not present

## 2019-05-08 ENCOUNTER — Other Ambulatory Visit: Payer: Self-pay | Admitting: Family Medicine

## 2019-05-08 DIAGNOSIS — F419 Anxiety disorder, unspecified: Secondary | ICD-10-CM

## 2019-05-15 IMAGING — MR MR HEAD W/O CM
10 series · 40 of 48 positions shown · non-contrast
Comparison: CT 03/21/2017.  MRI 08/19/2014.

CLINICAL DATA: Acute onset of dizziness and slurred speech last
night. Facial droop.

EXAM:
MRI HEAD WITHOUT CONTRAST
TECHNIQUE: Multiplanar, multiecho pulse sequences of the brain and surrounding
structures were obtained without intravenous contrast.

[Series 2: T1 · sagittal · 5.0mm · 0.47mm/px · 2 of 22 slices shown (1 of 2)]
[im 1/22]
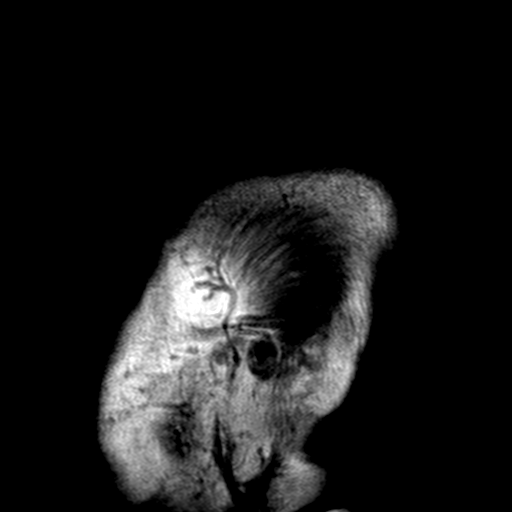
[im 22/22]
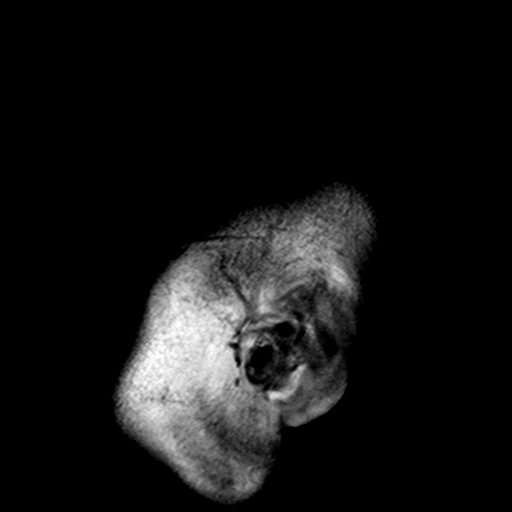

[Series 4: DWI · axial · 3.0mm · 0.94mm/px · z∈[-110,+40]mm · 5 of 51 slices shown (1 of 2)]
[im 1/51]
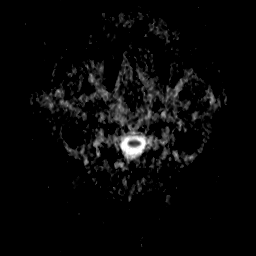
[im 13/51]
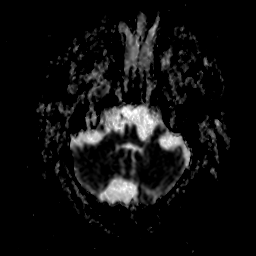
[im 26/51]
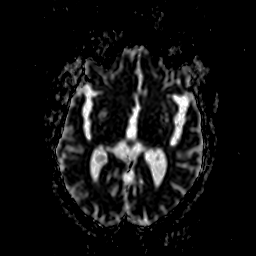
[im 38/51]
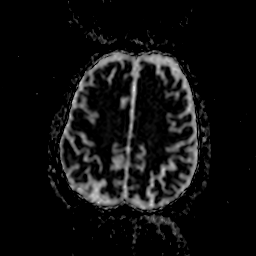
[im 51/51]
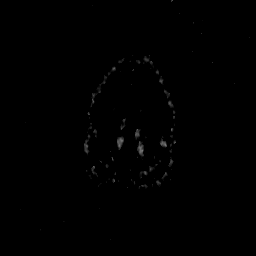

[Series 5: (id) ax · axial · 3.0mm · 0.94mm/px · z∈[-110,+40]mm · 5 of 51 slices shown]
[im 1/51]
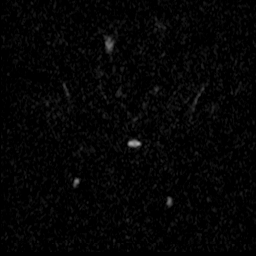
[im 13/51]
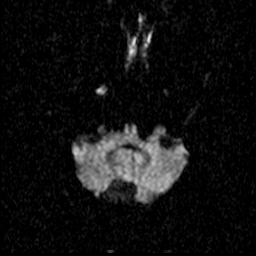
[im 26/51]
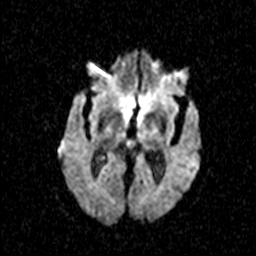
[im 38/51]
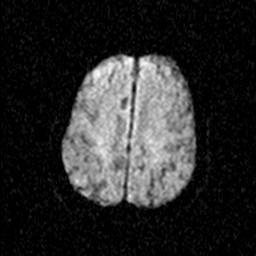
[im 51/51]
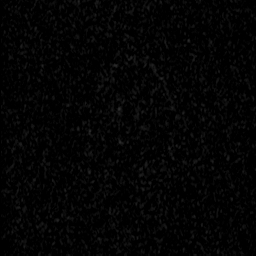

[Series 7: DWI · coronal · 5.0mm · 1.80mm/px · 4 of 39 slices shown (2 of 2)]
[im 1/39]
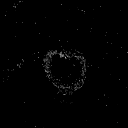
[im 13/39]
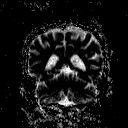
[im 26/39]
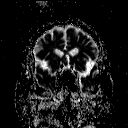
[im 39/39]
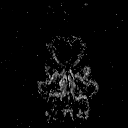

[Series 8: T2 · axial · 5.0mm · 0.45mm/px · z∈[-111,+42]mm · 2 of 23 slices shown (1 of 3)]
[im 1/23]
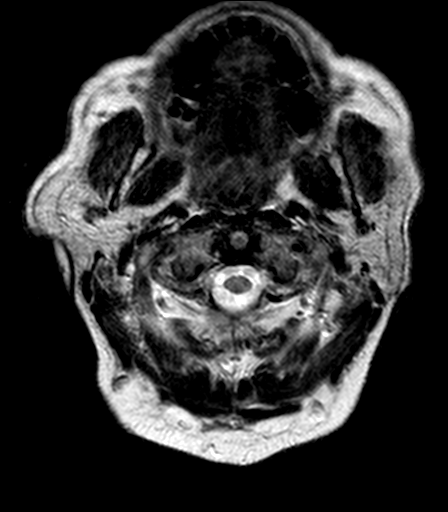
[im 23/23]
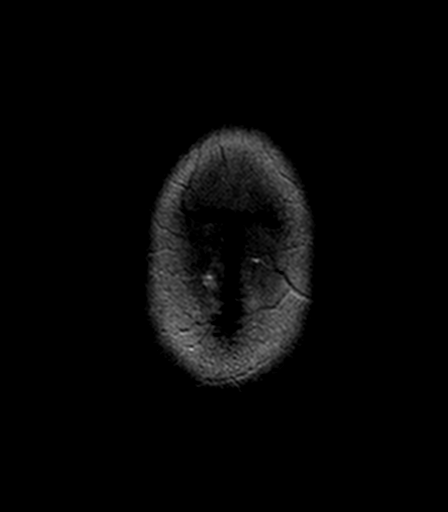

[Series 9: FLAIR · axial · 3.0mm · 0.90mm/px · z∈[-107,+39]mm · 5 of 50 slices shown]
[im 1/50]
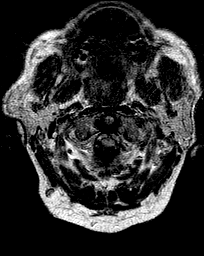
[im 13/50]
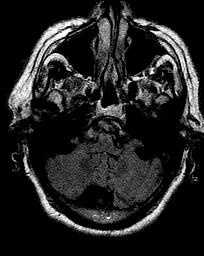
[im 25/50]
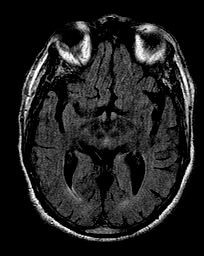
[im 37/50]
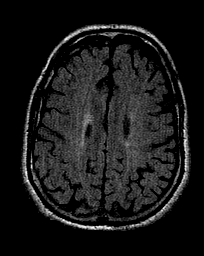
[im 50/50]
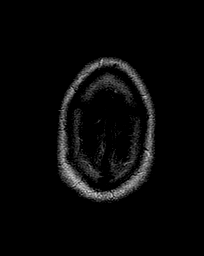

[Series 10: (id) cor · coronal · 5.0mm · 1.80mm/px · 4 of 39 slices shown]
[im 1/39]
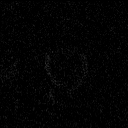
[im 13/39]
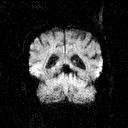
[im 26/39]
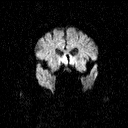
[im 39/39]
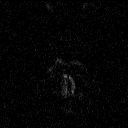

[Series 11: T2 · axial · 5.0mm · 0.45mm/px · z∈[-111,+42]mm · 2 of 23 slices shown (2 of 3)]
[im 1/23]
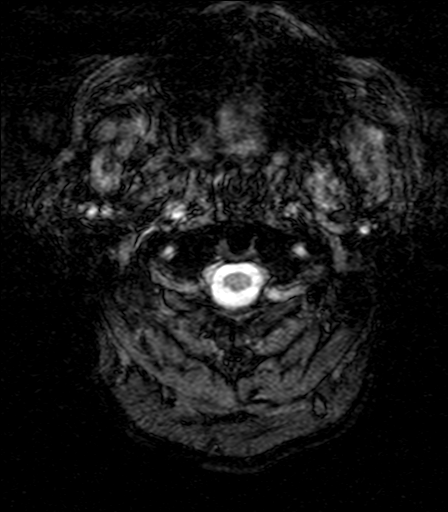
[im 23/23]
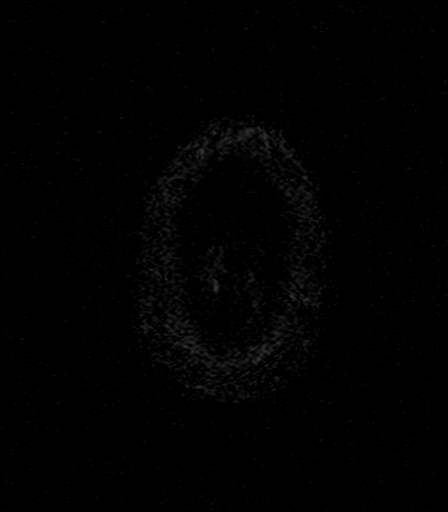

[Series 12: T1 · axial · 1.0mm · 0.45mm/px · z∈[-103,+45]mm · 8 of 160 slices shown (2 of 2)]
[im 11/160]
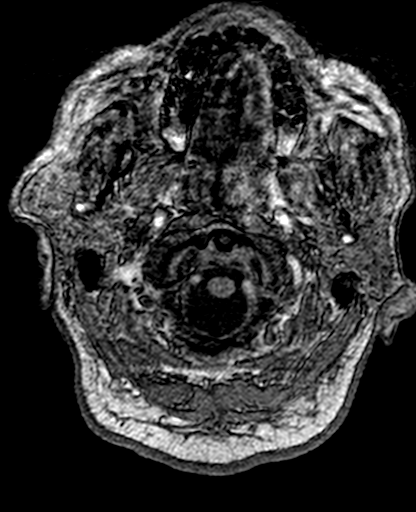
[im 32/160]
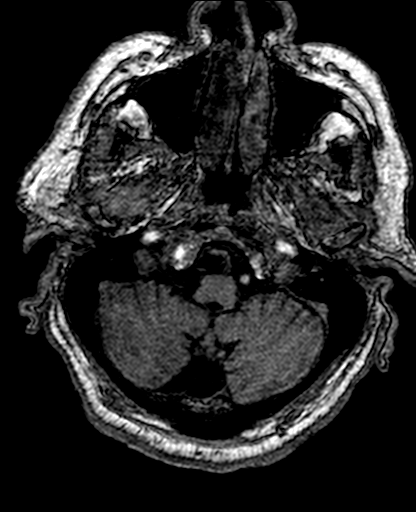
[im 54/160]
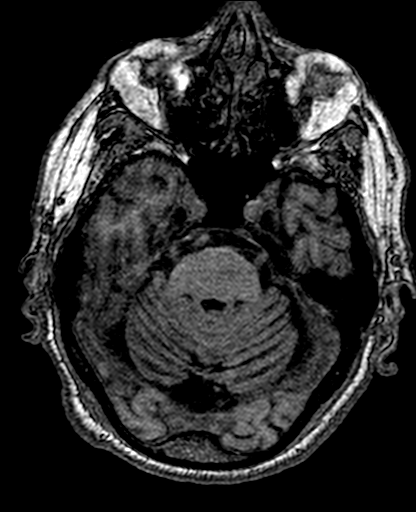
[im 75/160]
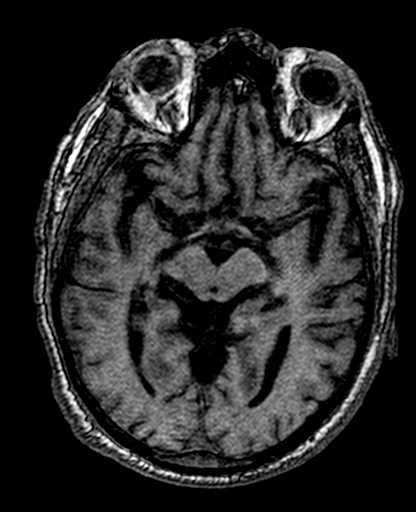
[im 96/160]
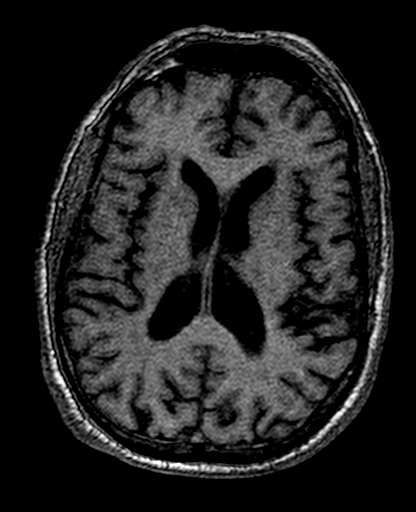
[im 117/160]
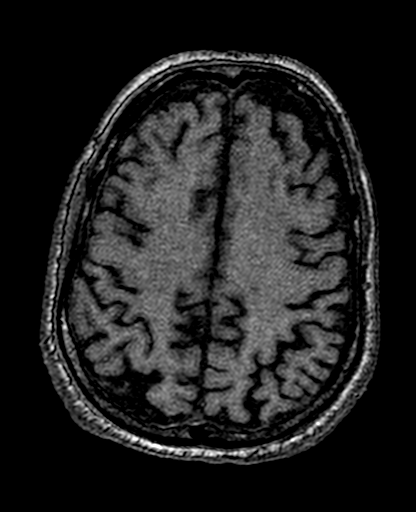
[im 138/160]
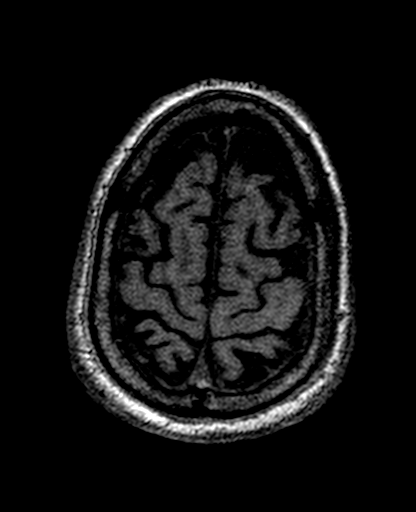
[im 160/160]
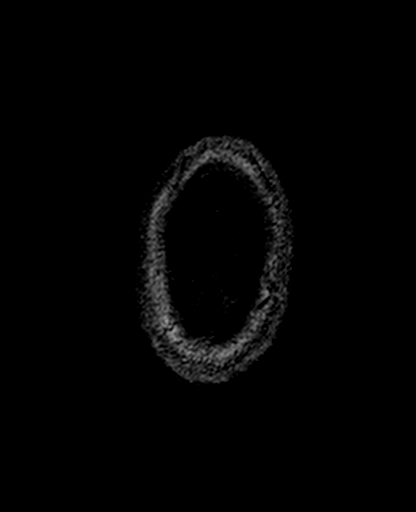

[Series 13: T2 · coronal · 5.0mm · 0.45mm/px · 3 of 27 slices shown (3 of 3)]
[im 1/27]
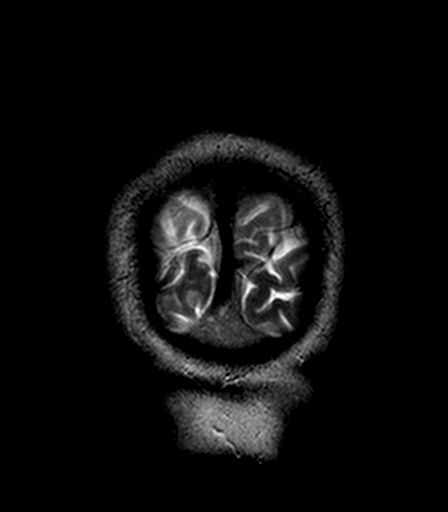
[im 14/27]
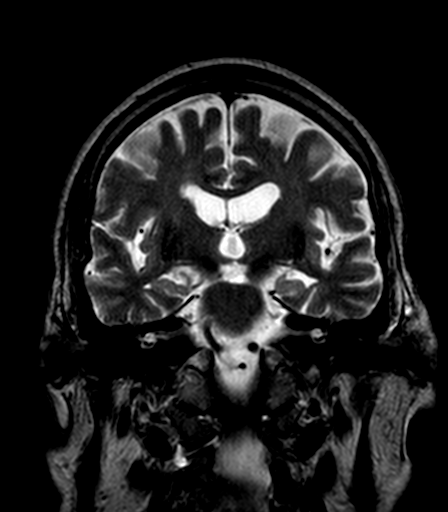
[im 27/27]
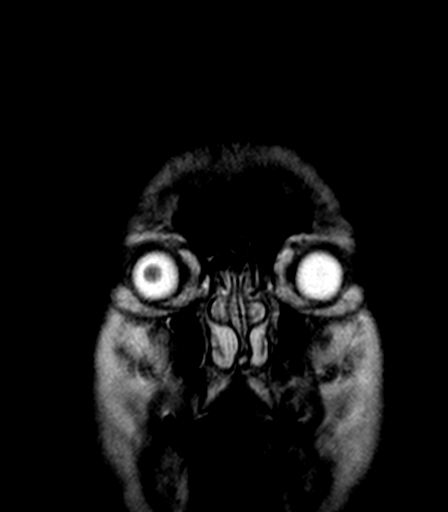

[40 of 48 positions shown; findings below may reference images not displayed]

FINDINGS: Brain: Diffusion imaging does not show any acute or subacute
infarction. The brainstem is normal. There cerebellar atrophy and
there is mega cisterna magna. Cerebral hemispheres show an old
infarction in the right lateral thalamus and mild chronic
small-vessel ischemic change elsewhere in the hemispheric white
matter. No large vessel territory infarction. No mass lesion,
hemorrhage, hydrocephalus or extra-axial collection.

Vascular: Major vessels at the base of the brain show flow.

Skull and upper cervical spine: Negative

Sinuses/Orbits: Sinuses are clear except for mild mucosal thickening
of the left maxillary sinus. Orbits negative.

Other: None
IMPRESSION: No acute finding by MRI. Old infarction right lateral thalamus. Mild
chronic small-vessel ischemic changes of the hemispheric white
matter. Cerebellar atrophy.

## 2019-05-15 IMAGING — CT CT HEAD W/O CM
3 series · 15 of 47 positions shown, 18 images · non-contrast
Comparison: MRI and CT 08/19/2014.

CLINICAL DATA: Slurred speech, facial droop.

EXAM:
CT HEAD WITHOUT CONTRAST
TECHNIQUE: Contiguous axial images were obtained from the base of the skull
through the vertex without intravenous contrast.

[Series 3: head wo · axial · 0.45mm/px · z∈[-60,+65]mm · 9 of 31 slices shown, 12 images]
[im 3/31  brain]
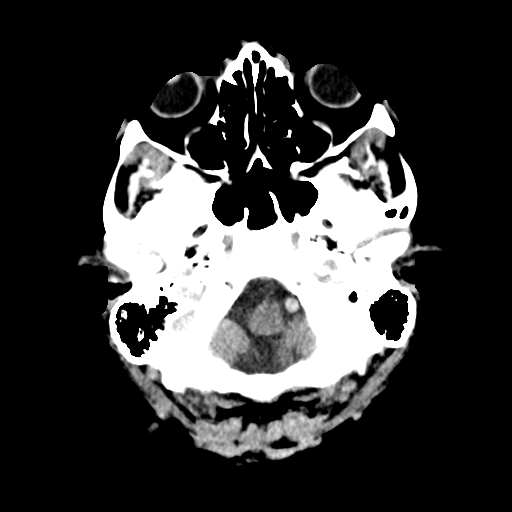
[im 3/31  bone]
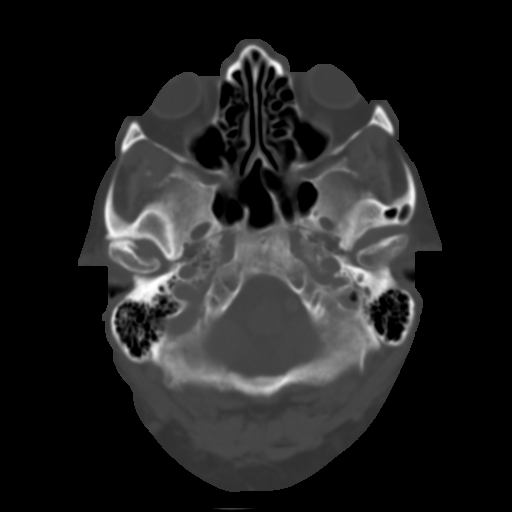
[im 6/31  brain]
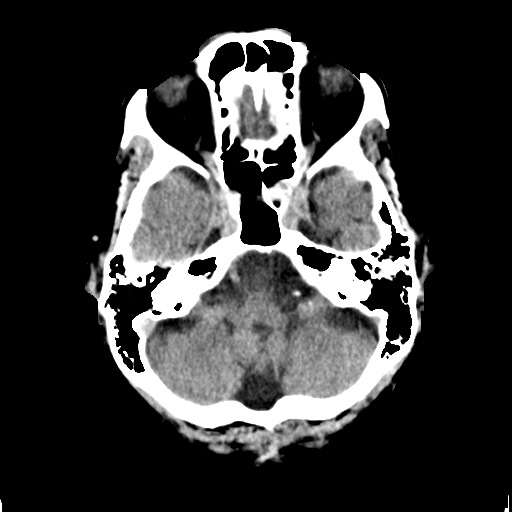
[im 9/31  brain]
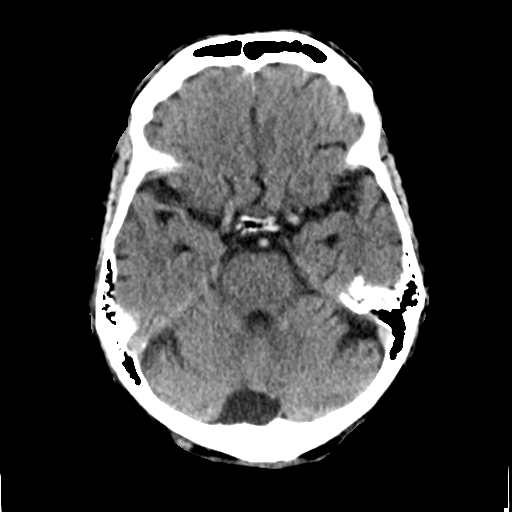
[im 12/31  brain]
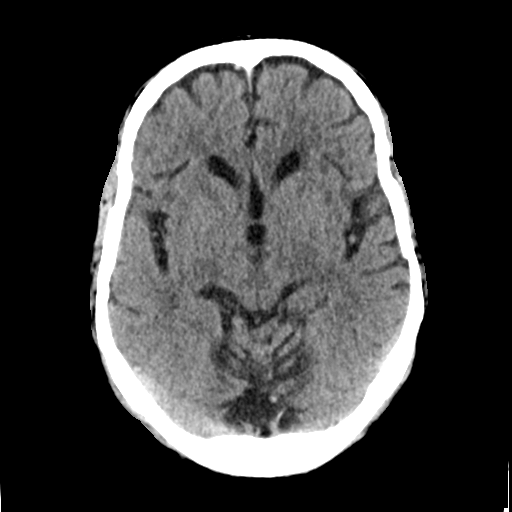
[im 16/31  brain]
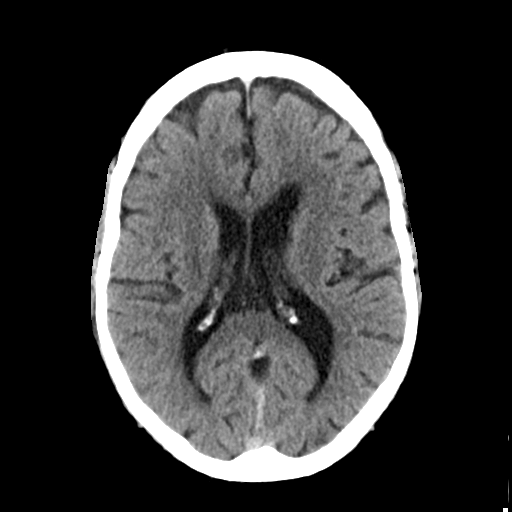
[im 16/31  bone]
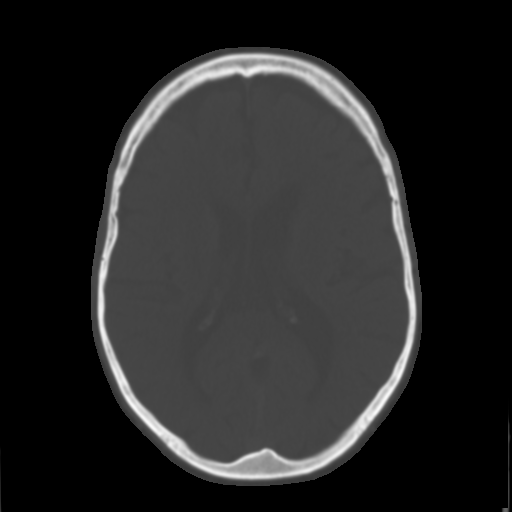
[im 19/31  brain]
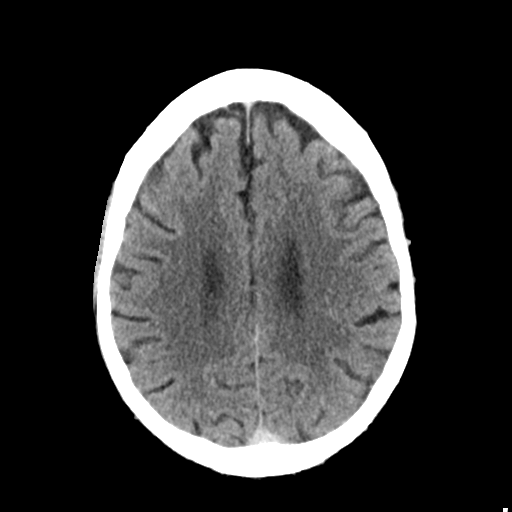
[im 22/31  brain]
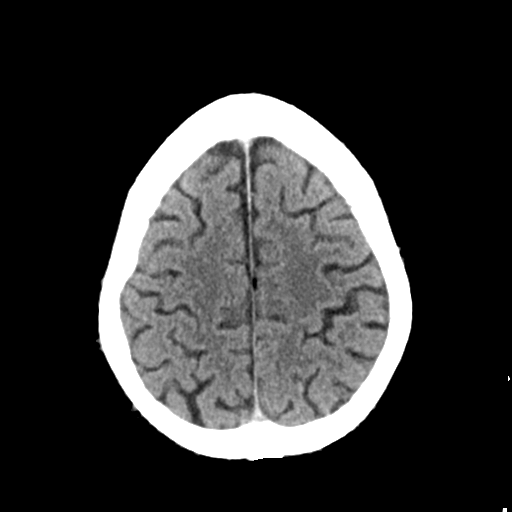
[im 25/31  brain]
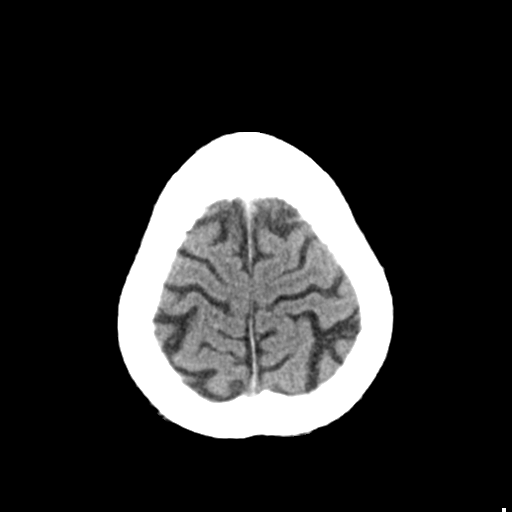
[im 28/31  brain]
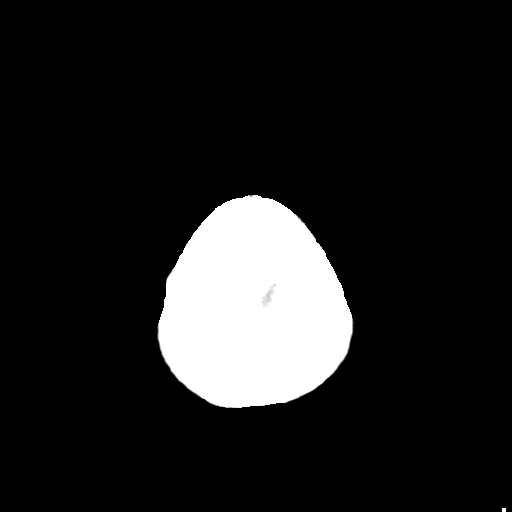
[im 28/31  bone]
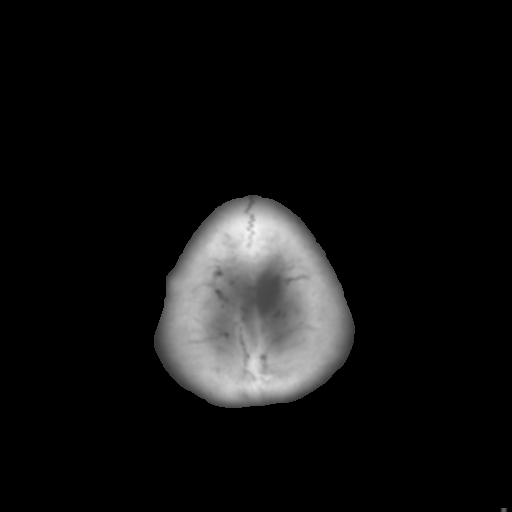

[Series 4: coronal soft tissue · coronal · 0.32mm/px · 3 of 68 slices shown]
[im 23/68  brain]
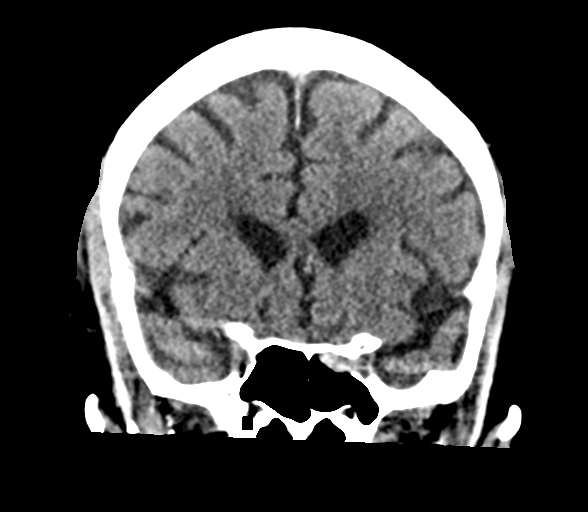
[im 30/68  brain]
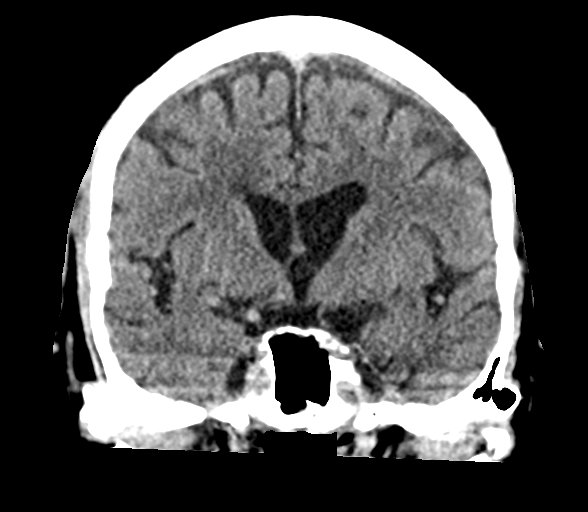
[im 38/68  brain]
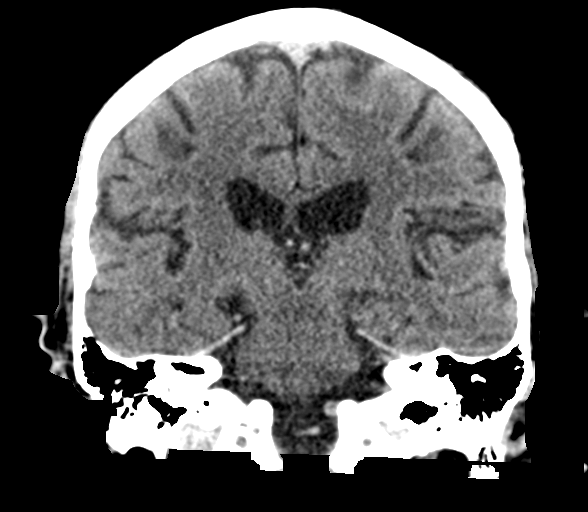

[Series 5: sagittal soft tissue · sagittal · 0.32mm/px · 3 of 57 slices shown]
[im 19/57  brain]
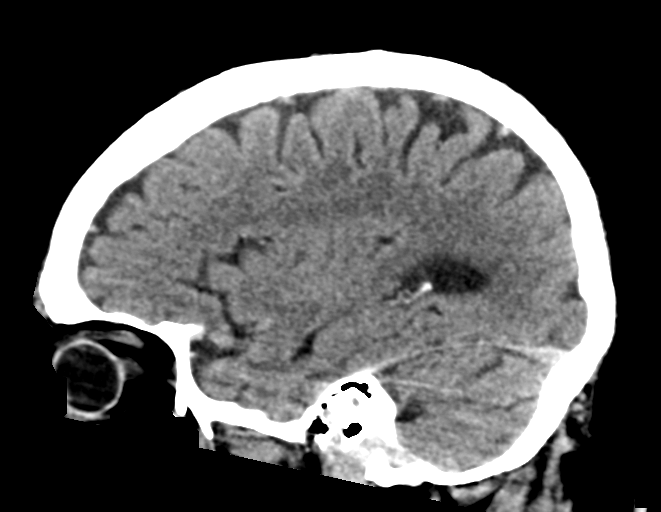
[im 29/57  brain]
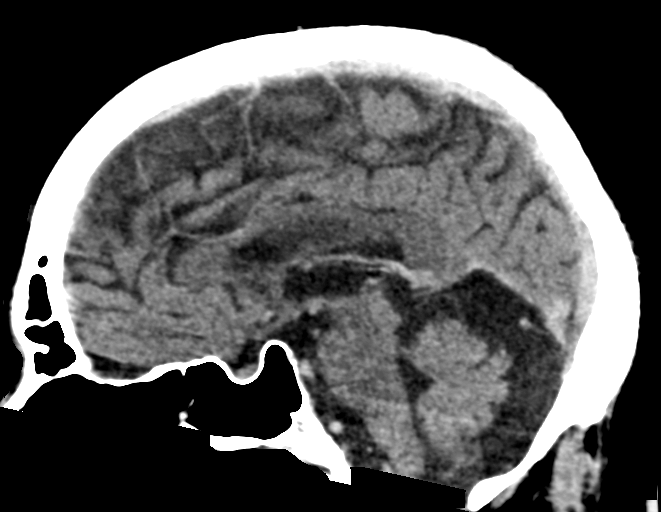
[im 38/57  brain]
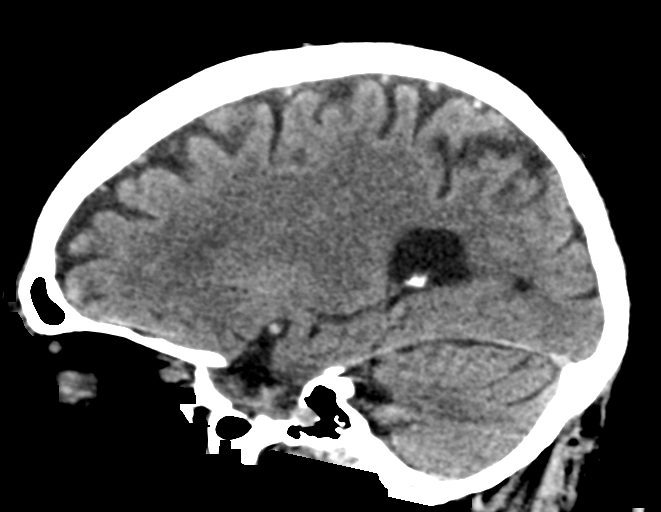

[15 of 47 positions shown; findings below may reference images not displayed]

FINDINGS: Brain: Subtle low-density is noted in the left thalamus, question
acute left thalamus infarct. Old lacunar infarct in the right
thalamus/internal capsule. There is atrophy and chronic small vessel
disease changes.

Vascular: No hyperdense vessel or unexpected calcification.

Skull: No acute calvarial abnormality.

Sinuses/Orbits: Visualized paranasal sinuses and mastoids clear.
Orbital soft tissues unremarkable.

Other: None
IMPRESSION: Question subtle low-density in the left thalamus, possibly acute
infarction. This could be further evaluated with MRI.

## 2019-05-30 DIAGNOSIS — G4736 Sleep related hypoventilation in conditions classified elsewhere: Secondary | ICD-10-CM | POA: Diagnosis not present

## 2019-05-30 DIAGNOSIS — D751 Secondary polycythemia: Secondary | ICD-10-CM | POA: Diagnosis not present

## 2019-05-30 DIAGNOSIS — G4733 Obstructive sleep apnea (adult) (pediatric): Secondary | ICD-10-CM | POA: Diagnosis not present

## 2019-06-11 ENCOUNTER — Encounter: Payer: Self-pay | Admitting: Family Medicine

## 2019-06-11 ENCOUNTER — Telehealth (INDEPENDENT_AMBULATORY_CARE_PROVIDER_SITE_OTHER): Payer: PPO | Admitting: Family Medicine

## 2019-06-11 VITALS — Resp 14 | Ht 73.0 in | Wt 239.0 lb

## 2019-06-11 DIAGNOSIS — J329 Chronic sinusitis, unspecified: Secondary | ICD-10-CM

## 2019-06-11 DIAGNOSIS — J310 Chronic rhinitis: Secondary | ICD-10-CM | POA: Diagnosis not present

## 2019-06-11 MED ORDER — FLUTICASONE PROPIONATE 50 MCG/ACT NA SUSP: 2.0000 | Freq: Every day | NASAL | 2 refills | Status: DC | PRN

## 2019-06-11 MED ORDER — LEVOCETIRIZINE DIHYDROCHLORIDE 5 MG PO TABS: 5.0000 mg | ORAL_TABLET | Freq: Every evening | ORAL | 3 refills | Status: DC

## 2019-06-11 NOTE — Progress Notes (Signed)
Name: Jeffery Campbell   MRN: [id number]    DOB: 1928-11-22   Date:06/11/2019       Progress Note  Subjective:    Chief Complaint  Chief Complaint  Patient presents with  . Nasal Congestion    most likely allergies    I connected with  [name] on 06/11/19 at 11:40 AM EST by telephone and verified that I am speaking with the correct person using two identifiers.   I discussed the limitations, risks, security and privacy concerns of performing an evaluation and management service by telephone and the availability of in person appointments. Staff also discussed with the patient that there may be a patient responsible charge related to this service. Patient Location: home Provider Location: [facility] Additional Individuals present: none  HPI Patient presents via telephone encounter for complaint of nasal drainage, congestion and increased sneezing he states it is consistent with allergies which get much worse in spring or with changing weather or pollen exposure.   He denies any pain to his face, forehead denies headache, fever, chills, sweats, sore throat, cough, chest congestion, shortness of breath.   Flonase is on his med list but is not using Flonase or any allergy medications.    Patient Active Problem List   Diagnosis Date Noted  . Recurrent major depressive disorder, in partial remission (HCC) 10/08/2018  . Erythrocytosis 01/31/2018  . Hx of completed stroke 01/10/2018  . Prediabetes 10/07/2017  . Dizziness 07/03/2017  . Abnormal gait 10/05/2016  . Hypoxia, sleep related 04/20/2016  . Elevated hematocrit 04/04/2016  . Right flank pain 01/10/2016  . General unsteadiness 05/24/2015  . Allergic rhinitis due to pollen 11/12/2014  . Back pain, chronic 10/21/2014  . Benign paroxysmal positional nystagmus 10/21/2014  . Decreased libido 10/21/2014  . Fatigue 10/21/2014  . Gastro-esophageal reflux disease without esophagitis 10/21/2014  . Dysmetabolic syndrome 10/21/2014  .  Fungal infection of nail 10/21/2014  . HLD (hyperlipidemia) 08/19/2014  . CKD (chronic kidney disease) stage 3, GFR 30-59 ml/min 08/19/2014  . Obesity   . Asymptomatic PVCs 05/27/2014  . Essential hypertension 04/05/2014  . Osteoarthrosis involving more than one site but not generalized 08/03/2008    Social History   Tobacco Use  . Smoking status: Never Smoker  . Smokeless tobacco: Never Used  . Tobacco comment: smoking cessation materials not required  Substance Use Topics  . Alcohol use: No    Alcohol/week: 0.0 standard drinks     Current Outpatient Medications:  .  acetaminophen (TYLENOL) 325 MG tablet, Take 325-650 mg by mouth every 6 (six) hours as needed for moderate pain or headache., Disp: , Rfl:  .  alfuzosin (UROXATRAL) 10 MG 24 hr tablet, Take 1 tablet (10 mg total) by mouth daily with breakfast., Disp: 90 tablet, Rfl: 3 .  atorvastatin (LIPITOR) 20 MG tablet, TAKE 1 TABLET BY MOUTH EVERYDAY AT BEDTIME, Disp: 90 tablet, Rfl: 1 .  busPIRone (BUSPAR) 5 MG tablet, TAKE 1 TABLET BY MOUTH 2 TIMES DAILY AS NEEDED., Disp: 60 tablet, Rfl: 3 .  clopidogrel (PLAVIX) 75 MG tablet, Take 1 tablet (75 mg total) by mouth daily. (stop aggrenox), Disp: 90 tablet, Rfl: 3 .  Cyanocobalamin (B-12) 3000 MCG SUBL, Place 3,000 mcg under the tongue daily. , Disp: 30 tablet, Rfl: 11 .  diltiazem (CARDIZEM CD) 120 MG 24 hr capsule, TAKE 1 CAPSULE BY MOUTH EVERY DAY, Disp: 90 capsule, Rfl: 1 .  docusate sodium (COLACE) 100 MG capsule, Take 100 mg by mouth daily  as needed for mild constipation., Disp: , Rfl:  .  famotidine (PEPCID) 20 MG tablet, TAKE 1 TABLET (20 MG TOTAL) BY MOUTH 2 (TWO) TIMES DAILY AS NEEDED FOR HEARTBURN OR INDIGESTION., Disp: 180 tablet, Rfl: 1 .  finasteride (PROSCAR) 5 MG tablet, Take 1 tablet (5 mg total) by mouth daily., Disp: 90 tablet, Rfl: 3 .  fluticasone (FLONASE) 50 MCG/ACT nasal spray, Place 2 sprays into both nostrils daily as needed for allergies. , Disp: , Rfl:  .   meclizine (ANTIVERT) 25 MG tablet, TAKE 1/2 TO 1 TABLET (12.5-25 MG TOTAL) BY MOUTH 3 (THREE) TIMES DAILY AS NEEDED FOR DIZZINESS., Disp: 30 tablet, Rfl: 2 .  Omega 3 1200 MG CAPS, Take 1,200 mg by mouth daily. , Disp: , Rfl:  .  Propylene Glycol (SYSTANE BALANCE) 0.6 % SOLN, Place 1 drop into both eyes 3 (three) times daily as needed (dry eyes)., Disp: , Rfl:   No Known Allergies  Chart Review: I personally reviewed active problem list, medication list, allergies, family history, social history, health maintenance, notes from last encounter, lab results, imaging with the patient/caregiver today.   Review of Systems 10 Systems reviewed and are negative for acute change except as noted in the HPI.   Objective:    Virtual encounter, vitals limited, only able to obtain the following Today's Vitals   06/11/19 1144  Resp: 14  Weight: 239 lb (108.4 kg)  Height: 6\' 1"  (1.854 m)   Body mass index is 31.53 kg/m. Nursing Note and Vital Signs reviewed.  Physical Exam Patient alert, clear phonation, no obvious audible congestion coughing, wheeze or stridor.  Answering questions appropriately. PE limited by telephone encounter  No results found for this or any previous visit (from the past 72 hour(s)).  Assessment and Plan:     ICD-10-CM   1. Rhinosinusitis  J31.0 fluticasone (FLONASE) 50 MCG/ACT nasal spray   J32.9 levocetirizine (XYZAL) 5 MG tablet  Patient with complaints of nasal symptoms sneezing drainage some congestion without any pain or tenderness to palpation of his cheeks or forehead he has no other constitutional symptoms concerning for viral illness or acute bacterial sinusitis.  He reports history consistent with this seasonal allergies, not currently on medications that are on chart, discussed with him doing a steroid nasal spray, doing it daily for more than the next few weeks in order to control some of the congestion.  Encouraged him to get over-the-counter antihistamine  and take 1 every night at bedtime again explained it will take usually doing daily for more than 2 weeks to be able to get control of allergies.  I did explain that all the medications are over-the-counter and insurance may not pay for them but he did asked me to send through prescriptions just to see if they are at all covered explained he can get Flonase or Nasonex, Xyzal, Zyrtec, Claritin or Allegra and I did agree to send through medications.  Also explained that he should follow-up if he has any acute worsening such as severe and sudden onset of sinus pain and pressure, fever, headache, chest congestion coughing shortness of breath.  He verbalized understanding of plan  - I discussed the assessment and treatment plan with the patient. The patient was provided an opportunity to ask questions and all were answered. The patient agreed with the plan and demonstrated an understanding of the instructions.  - The patient was advised to call back or seek an in-person evaluation if the symptoms worsen or if the  condition fails to improve as anticipated.  I provided 12 minutes of non-face-to-face time on phone with patient and 20 + min during encounter to review chart, med list, document, prescribe meds etc.   Delsa Grana, PA-C 06/11/19 3:01 PM

## 2019-06-27 DIAGNOSIS — D751 Secondary polycythemia: Secondary | ICD-10-CM | POA: Diagnosis not present

## 2019-06-27 DIAGNOSIS — G4736 Sleep related hypoventilation in conditions classified elsewhere: Secondary | ICD-10-CM | POA: Diagnosis not present

## 2019-06-27 DIAGNOSIS — G4733 Obstructive sleep apnea (adult) (pediatric): Secondary | ICD-10-CM | POA: Diagnosis not present

## 2019-07-09 ENCOUNTER — Ambulatory Visit: Payer: PPO | Admitting: Family Medicine

## 2019-07-12 NOTE — Progress Notes (Signed)
Patient ID: Jeffery Campbell, male    DOB: [birth year], 86 y.o.   MRN: [id number]  PCP: Jeffery Campbell, Jeffery E, [name]  Chief Complaint  Patient presents with  . Follow-up  . Hypertension  . Hyperlipidemia    Subjective:   Jeffery Campbell is a 86 y.o. male, presents to clinic with CC of the following:  Chief Complaint  Patient presents with  . Follow-up  . Hypertension  . Hyperlipidemia    HPI:  Patient is a 86 y.o.male patient of Jeffery Campbell, last routine f/u visit (telemedicine) in January by Jeffery Campbell and Campbell in March 2021 for rhinitis sx's. Returns today for follow-up. He had his master's hat on today having just watch the tournament this past weekend  He has a significant PMH as summarized in his active problem list.  Issues followed in recent past have included:  Hyperlipidemia: Current Medication Regimen:   lipitor 20 mg Last Lipids: Lab Results  Component Value Date   CHOL 131 04/09/2018   HDL 41 04/09/2018   LDLCALC 71 04/09/2018   TRIG 102 04/09/2018   CHOLHDL 3.2 04/09/2018   - Current Diet: tries to eat healthy, has been gaining some weight - Denies: Chest pain, shortness of breath, myalgias.  Hypertension:  Currently managed on diltiazem 120 mg  Pt reports compliance with med, and denies any SE. Denies chest pain, palp's, shortness of breath, BLE edema, headaches.   Not checking BP's at home.  Occasionally gets his blood pressure checked at a pharmacy, and has been okay. Blood pressure today was good at 124/68  BP Readings from Last 3 Encounters:  02/11/19 (!) 144/69  12/18/18 136/61  09/24/18 (!) 145/80    Anxiety and depression:  Was taking Celexa for more depression concerns, without issues, added Buspar 5 mg BID with Jeffery Campbell in Dec 2020 as more anxiety noted with Covid pandemic contributing, dosed once a day seemed to be helping last f/u and continued, and now takes twice daily. Not take celexa presently. Notes the twice daily BuSpar has been  helpful.  BPH/ LUTS - seeing urology, next f/u in June 2021 Has mild nocturia , once a night often and stable  taking alfuzosin and finasteride  Erythrocytosis - followed by oncology and last visit 02/11/2019 and noted:    Erythrocytosis isolated-unclear etiology/however SECONDARY -negative Jak 2/normal erythropoietin levels.  Today hemoglobin 17; hematocrit  48; patient is asymptomatic hold off phlebotomy.   NO phelbotomy today   Follow up in 12 months/MD/lab- cbc/bmp/possible Phlebtotomy- Dr.B   Prediabetes/Obesity: No polyuria, increased thirst or increased numbness/tingling in the ext's.  Last A1C in January 2020 was 5.1% - consistently been below prediabetic range for over a year at that time Trying to eat more vegetables. Weight has increased recent past.   Lab Results  Component Value Date   HGBA1C 5.1 04/09/2018   Wt Readings from Last 3 Encounters:  07/13/19 247 lb 9.6 oz (112.3 kg)  06/11/19 239 lb (108.4 kg)  04/10/19 239 lb (108.4 kg)     Constipation:  Occasionally has a couple days without a bowel movement.  Feels uncomfortable at times.  He has been taking miralax, and notes that has been helpful, and the woman at drug store very helpful with this.   No blood in stool, dark or tarry stools, or abdominal pain.    HX stroke: Stable,  taking plavix daily.   CKD - Stage 3 on last check Lab Results  Component Value Date   CREATININE  1.15 02/11/2019    GERD: Denies abdominal pain, black stools or bleeding, or heartburn.  Taking pepcid and avoiding triggers.   OA: states he is doing well with his mobility, has not fallen in the recent past, and is careful getting around.  He notes he does have a cane and walker at home as was provided after his stroke, although he has not needed these.  No increased pain   Rhinitis - taking flonase and antihistamine recommended after his last visit in March   Had cataract surgery 12/2018 and notes his vision has been okay after  that surgery.   Had two doses of Covid vaccine.   Patient Active Problem List   Diagnosis Date Noted  . Recurrent major depressive disorder, in partial remission ([place]) 10/08/2018  . Erythrocytosis 01/31/2018  . Hx of completed stroke 01/10/2018  . Prediabetes 10/07/2017  . Dizziness 07/03/2017  . Abnormal gait 10/05/2016  . Hypoxia, sleep related 04/20/2016  . Elevated hematocrit 04/04/2016  . Right flank pain 01/10/2016  . General unsteadiness 05/24/2015  . Allergic rhinitis due to pollen 11/12/2014  . Back pain, chronic 10/21/2014  . Benign paroxysmal positional nystagmus 10/21/2014  . Decreased libido 10/21/2014  . Fatigue 10/21/2014  . Gastro-esophageal reflux disease without esophagitis 10/21/2014  . Dysmetabolic syndrome 29/52/8413  . Fungal infection of nail 10/21/2014  . HLD (hyperlipidemia) 08/19/2014  . CKD (chronic kidney disease) stage 3, GFR 30-59 ml/min 08/19/2014  . Obesity   . Asymptomatic PVCs 05/27/2014  . Essential hypertension 04/05/2014  . Osteoarthrosis involving more than one site but not generalized 08/03/2008      Current Outpatient Medications:  .  acetaminophen (TYLENOL) 325 MG tablet, Take 325-650 mg by mouth every 6 (six) hours as needed for moderate pain or headache., Disp: , Rfl:  .  alfuzosin (UROXATRAL) 10 MG 24 hr tablet, Take 1 tablet (10 mg total) by mouth daily with breakfast., Disp: 90 tablet, Rfl: 3 .  atorvastatin (LIPITOR) 20 MG tablet, TAKE 1 TABLET BY MOUTH EVERYDAY AT BEDTIME, Disp: 90 tablet, Rfl: 1 .  busPIRone (BUSPAR) 5 MG tablet, TAKE 1 TABLET BY MOUTH 2 TIMES DAILY AS NEEDED., Disp: 60 tablet, Rfl: 3 .  clopidogrel (PLAVIX) 75 MG tablet, Take 1 tablet (75 mg total) by mouth daily. (stop aggrenox), Disp: 90 tablet, Rfl: 3 .  Cyanocobalamin (B-12) 3000 MCG SUBL, Place 3,000 mcg under the tongue daily. , Disp: 30 tablet, Rfl: 11 .  diltiazem (CARDIZEM CD) 120 MG 24 hr capsule, TAKE 1 CAPSULE BY MOUTH EVERY DAY, Disp: 90 capsule,  Rfl: 1 .  docusate sodium (COLACE) 100 MG capsule, Take 100 mg by mouth daily as needed for mild constipation., Disp: , Rfl:  .  famotidine (PEPCID) 20 MG tablet, TAKE 1 TABLET (20 MG TOTAL) BY MOUTH 2 (TWO) TIMES DAILY AS NEEDED FOR HEARTBURN OR INDIGESTION., Disp: 180 tablet, Rfl: 1 .  finasteride (PROSCAR) 5 MG tablet, Take 1 tablet (5 mg total) by mouth daily., Disp: 90 tablet, Rfl: 3 .  fluticasone (FLONASE) 50 MCG/ACT nasal spray, Place 2 sprays into both nostrils daily as needed for allergies or rhinitis., Disp: 16 g, Rfl: 2 .  levocetirizine (XYZAL) 5 MG tablet, Take 1 tablet (5 mg total) by mouth every evening., Disp: 30 tablet, Rfl: 3 .  meclizine (ANTIVERT) 25 MG tablet, TAKE 1/2 TO 1 TABLET (12.5-25 MG TOTAL) BY MOUTH 3 (THREE) TIMES DAILY AS NEEDED FOR DIZZINESS., Disp: 30 tablet, Rfl: 2 .  Misc Natural Products ([place]  MENS PROSTATE HEALTH PO), Take by mouth., Disp: , Rfl:  .  Omega 3 1200 MG CAPS, Take 1,200 mg by mouth daily. , Disp: , Rfl:  .  Propylene Glycol (SYSTANE BALANCE) 0.6 % SOLN, Place 1 drop into both eyes 3 (three) times daily as needed (dry eyes)., Disp: , Rfl:    No Known Allergies   Past Surgical History:  Procedure Laterality Date  . CATARACT EXTRACTION W/PHACO Left 02/14/2016   Procedure: CATARACT EXTRACTION PHACO AND INTRAOCULAR LENS PLACEMENT (IOC);  Surgeon: Galen Manila, MD;  Location: [facility] ORS;  Service: Ophthalmology;  Laterality: Left;  [place] 53.7 AP% 21.5 CDE 11.58 FLUID PACK LOT # [id number]  . CATARACT EXTRACTION W/PHACO Right 12/18/2018   Procedure: CATARACT EXTRACTION PHACO AND INTRAOCULAR LENS PLACEMENT (IOC)  RIGHT, VISION BLUE;  Surgeon: Elliot Cousin, MD;  Location: [facility] ORS;  Service: Ophthalmology;  Laterality: Right;  [place]  01:14 CDE 11.82 Fluid pack lot # [id number]  . COLONOSCOPY    . KNEE ARTHROSCOPY    . TRANSURETHRAL RESECTION OF PROSTATE       Family History  Problem Relation Age of Onset  . Hypertension Son        Posey Rea of parents  health.  . Prostate cancer Neg Hx   . Bladder Cancer Neg Hx   . Kidney cancer Neg Hx      Social History   Tobacco Use  . Smoking status: Never Smoker  . Smokeless tobacco: Never Used  . Tobacco comment: smoking cessation materials not required  Substance Use Topics  . Alcohol use: No    Alcohol/week: 0.0 standard drinks    With staff assistance, above reviewed with the patient today.  ROS: As per HPI, otherwise no specific complaints on a limited and focused system review   No results found for this or any previous visit (from the past 72 hour(s)).   PHQ2/9: Depression screen [facility] 2/9 07/13/2019 06/11/2019 04/10/2019 03/13/2019 01/21/2019  Decreased Interest 0 0 0 0 0  Down, Depressed, Hopeless 0 0 0 0 0  PHQ - 2 Score 0 0 0 0 0  Altered sleeping 0 0 0 0 0  Tired, decreased energy 0 0 0 0 0  Change in appetite 0 0 0 0 0  Feeling bad or failure about yourself  0 0 0 0 0  Trouble concentrating 0 0 0 0 0  Moving slowly or fidgety/restless 0 0 0 0 0  Suicidal thoughts 0 0 0 0 0  PHQ-9 Score 0 0 0 0 0  Difficult doing work/chores Not difficult at all Not difficult at all Not difficult at all Not difficult at all Not difficult at all  Some recent data might be hidden   PHQ-2/9 Result is neg  Fall Risk: Fall Risk  07/13/2019 06/11/2019 04/10/2019 03/13/2019 01/21/2019  Falls in the past year? 0 0 0 0 0  Number falls in past yr: 0 0 0 0 0  Injury with Fall? 0 0 0 0 0  Risk for fall due to : - - - - -  Risk for fall due to: Comment - - - - -  Follow up - - - Falls evaluation completed Falls evaluation completed      Objective:   Vitals:   07/13/19 0841  Pulse: 66  Resp: 16  Temp: (!) 97.1 F (36.2 C)  TempSrc: Temporal  SpO2: 91%  Weight: 247 lb 9.6 oz (112.3 kg)  Height: 6\' 3"  (1.905 m)    Body mass  index is 30.95 kg/m.  Physical Exam  BP - 124/68 NAD, masked, very pleasant HEENT - [place]/AT, sclera anicteric, PERRL, EOMI, conj - non-inj'ed, pharynx clear Neck -  supple, no adenopathy, no TM, carotids 2+ and = without bruits bilat Car - RRR without m/g/r Pulm- RR and effort normal at rest, CTA without wheeze or rales Abd - soft, obese, NT diffusely, ND,  Back - no CVA tenderness Ext - no LE edema,  Neuro/psychiatric - affect was not flat, appropriate with conversation  Alert and oriented  Was able to arise from the chair on his own, and get to the exam table on his own and step up on the exam table with some mild assistance  Speech normal   Results for orders placed or performed in visit on 02/11/19  Basic metabolic panel  Result Value Ref Range   Sodium 139 135 - 145 mmol/L   Potassium 4.1 3.5 - 5.1 mmol/L   Chloride 109 98 - 111 mmol/L   CO2 24 22 - 32 mmol/L   Glucose, Bld 100 (H) 70 - 99 mg/dL   BUN 14 8 - 23 mg/dL   Creatinine, Ser 6.60 0.61 - 1.24 mg/dL   Calcium 8.9 8.9 - 63.0 mg/dL   GFR calc non Af Amer 57 (L) >60 mL/min   GFR calc Af Amer >60 >60 mL/min   Anion gap 6 5 - 15  CBC with Differential/Platelet  Result Value Ref Range   WBC 6.7 4.0 - 10.5 K/uL   RBC 5.42 4.22 - 5.81 MIL/uL   Hemoglobin 16.8 13.0 - 17.0 g/dL   HCT 16.0 10.9 - 32.3 %   MCV 90.2 80.0 - 100.0 fL   MCH 31.0 26.0 - 34.0 pg   MCHC 34.4 30.0 - 36.0 g/dL   RDW 55.7 32.2 - 02.5 %   Platelets 211 150 - 400 K/uL   nRBC 0.0 0.0 - 0.2 %   Neutrophils Relative % 62 %   Neutro Abs 4.1 1.7 - 7.7 K/uL   Lymphocytes Relative 24 %   Lymphs Abs 1.6 0.7 - 4.0 K/uL   Monocytes Relative 10 %   Monocytes Absolute 0.7 0.1 - 1.0 K/uL   Eosinophils Relative 3 %   Eosinophils Absolute 0.2 0.0 - 0.5 K/uL   Basophils Relative 1 %   Basophils Absolute 0.1 0.0 - 0.1 K/uL   Immature Granulocytes 0 %   Abs Immature Granulocytes 0.02 0.00 - 0.07 K/uL       Assessment & Plan:    1. Essential hypertension Blood pressure good on check today. Continue his current medication regimen. Noted the importance of keeping weight controlled, as as weight increases, blood pressure  can sometimes follow an increase.  2. Mixed hyperlipidemia Continue the statin product. We will recheck labs again in the fall.  3. Anxiety with depression Notes his symptoms are well controlled with the BuSpar product, taking it twice daily.  He no longer takes the Celexa product. Continue the BuSpar and continue to monitor.  4. Stage 3a chronic kidney disease We will recheck labs again on his next follow-up visit.  Has remained stable.  5. Benign prostatic hyperplasia with nocturia Continues to see urology, with a follow-up planned in June, and did recommend he keep his follow-up visit with them.  6. Erythrocytosis Followed by oncology, and felt to be secondary. Keep his planned follow-up with them a year from his last visit as they recommended.  7. Class 1 obesity due to excess calories  with serious comorbidity and body mass index (BMI) of 30.0 to 30.9 in adult Discussed the importance of weight management, as his weight has continued to increase some in the recent past.  Trying to watch portion control, and foods he eats briefly reviewed today, and he noted he is trying to eat more vegetables as part of his diet.  Continue to monitor.  8. Hx of completed stroke Continue the Plavix product.  9. Other constipation He is very happy with the results from the MiraLAX product that the pharmacist recommended, and will continue to use as needed.  10. Gastro-esophageal reflux disease without esophagitis Continue the famotidine product as that has been helpful.  11. Osteoarthrosis involving more than one site but not generalized Doing well getting around presently, noted he can do what he wants basically, and does walk a fair amount.  Not limited with any pain issues.    We will follow-up again in about 4 months time, and we will plan to check some labs again at that time.  Should follow-up sooner as needed.    Jamelle Haring, MD 07/13/19 9:16 AM

## 2019-07-13 ENCOUNTER — Encounter: Payer: Self-pay | Admitting: Internal Medicine

## 2019-07-13 ENCOUNTER — Other Ambulatory Visit: Payer: Self-pay

## 2019-07-13 ENCOUNTER — Ambulatory Visit (INDEPENDENT_AMBULATORY_CARE_PROVIDER_SITE_OTHER): Payer: PPO | Admitting: Internal Medicine

## 2019-07-13 VITALS — HR 66 | Temp 97.1°F | Resp 16 | Ht 75.0 in | Wt 247.6 lb

## 2019-07-13 DIAGNOSIS — E6609 Other obesity due to excess calories: Secondary | ICD-10-CM

## 2019-07-13 DIAGNOSIS — K5909 Other constipation: Secondary | ICD-10-CM | POA: Diagnosis not present

## 2019-07-13 DIAGNOSIS — N1831 Chronic kidney disease, stage 3a: Secondary | ICD-10-CM

## 2019-07-13 DIAGNOSIS — Z8673 Personal history of transient ischemic attack (TIA), and cerebral infarction without residual deficits: Secondary | ICD-10-CM

## 2019-07-13 DIAGNOSIS — D751 Secondary polycythemia: Secondary | ICD-10-CM | POA: Diagnosis not present

## 2019-07-13 DIAGNOSIS — N401 Enlarged prostate with lower urinary tract symptoms: Secondary | ICD-10-CM

## 2019-07-13 DIAGNOSIS — M159 Polyosteoarthritis, unspecified: Secondary | ICD-10-CM | POA: Diagnosis not present

## 2019-07-13 DIAGNOSIS — I10 Essential (primary) hypertension: Secondary | ICD-10-CM | POA: Diagnosis not present

## 2019-07-13 DIAGNOSIS — R351 Nocturia: Secondary | ICD-10-CM | POA: Diagnosis not present

## 2019-07-13 DIAGNOSIS — F418 Other specified anxiety disorders: Secondary | ICD-10-CM | POA: Diagnosis not present

## 2019-07-13 DIAGNOSIS — E782 Mixed hyperlipidemia: Secondary | ICD-10-CM | POA: Diagnosis not present

## 2019-07-13 DIAGNOSIS — Z6830 Body mass index (BMI) 30.0-30.9, adult: Secondary | ICD-10-CM

## 2019-07-13 DIAGNOSIS — N400 Enlarged prostate without lower urinary tract symptoms: Secondary | ICD-10-CM | POA: Insufficient documentation

## 2019-07-13 DIAGNOSIS — K219 Gastro-esophageal reflux disease without esophagitis: Secondary | ICD-10-CM

## 2019-07-20 DIAGNOSIS — H1013 Acute atopic conjunctivitis, bilateral: Secondary | ICD-10-CM | POA: Diagnosis not present

## 2019-07-28 DIAGNOSIS — G4733 Obstructive sleep apnea (adult) (pediatric): Secondary | ICD-10-CM | POA: Diagnosis not present

## 2019-07-28 DIAGNOSIS — D751 Secondary polycythemia: Secondary | ICD-10-CM | POA: Diagnosis not present

## 2019-07-28 DIAGNOSIS — G4736 Sleep related hypoventilation in conditions classified elsewhere: Secondary | ICD-10-CM | POA: Diagnosis not present

## 2019-08-04 ENCOUNTER — Other Ambulatory Visit: Payer: Self-pay | Admitting: Family Medicine

## 2019-08-04 NOTE — Telephone Encounter (Signed)
Requested medication (s) are due for refill today: Yes  Requested medication (s) are on the active medication list: Yes  Last refill:  04/15/19  Future visit scheduled: Yes  Notes to clinic:  See request.    Requested Prescriptions  Pending Prescriptions Disp Refills   meclizine (ANTIVERT) 25 MG tablet [Pharmacy Med Name: MECLIZINE 25 MG TABLET] 30 tablet 2    Sig: TAKE 1/2 TO 1 TABLET (12.5-25 MG TOTAL) BY MOUTH 3 (THREE) TIMES DAILY AS NEEDED FOR DIZZINESS.      Not Delegated - Gastroenterology: Antiemetics Failed - 08/04/2019 10:38 AM      Failed - This refill cannot be delegated      Passed - Valid encounter within last 6 months    Recent Outpatient Visits           3 weeks ago Essential hypertension   [facility] Jamelle Haring, MD   1 month ago Rhinosinusitis   [facility] Danelle Berry, PA-C   3 months ago Mixed hyperlipidemia   [facility] Danelle Berry, PA-C   4 months ago Anxiety   [facility] Doren Custard, [name]   6 months ago Urinary frequency   [facility] [place], Gerome Apley, [name]       Future Appointments             In 1 month [name]   In 2 months  [facility], [place]   In 3 months Jamelle Haring, MD [facility]

## 2019-08-14 ENCOUNTER — Telehealth: Payer: Self-pay

## 2019-08-14 NOTE — Telephone Encounter (Signed)
Pt called back and states he has an appt with Uro on Tuesday and he thanks [place] for his help

## 2019-08-14 NOTE — Telephone Encounter (Signed)
Patient is returning CMA call. Call back [phone]

## 2019-08-14 NOTE — Telephone Encounter (Signed)
Patient called to complain of some leaking after urination. Patient was advised to contact his urologist. If the patient has any trouble contact or scheduling with urology he will contact our office and we will schedule a telephone visit.

## 2019-08-14 NOTE — Telephone Encounter (Signed)
Copied from CRM [id number]. Topic: General - Other >> Aug 14, 2019  9:50 AM Jaquita Rector A wrote: Reason for CRM: Patient called to inquire of Dr Dorris Fetch if he can get something for his Prostate. States that if Dr could call him he can explain what is going on. Patient can be reached at Ph# [phone]

## 2019-08-14 NOTE — Telephone Encounter (Signed)
Jeffery Campbell to sch at 11:40

## 2019-08-18 ENCOUNTER — Other Ambulatory Visit: Payer: Self-pay

## 2019-08-18 ENCOUNTER — Ambulatory Visit (INDEPENDENT_AMBULATORY_CARE_PROVIDER_SITE_OTHER): Payer: PPO | Admitting: Urology

## 2019-08-18 ENCOUNTER — Encounter: Payer: Self-pay | Admitting: Urology

## 2019-08-18 VITALS — BP 191/83 | HR 68 | Ht 75.0 in | Wt 246.0 lb

## 2019-08-18 DIAGNOSIS — N138 Other obstructive and reflux uropathy: Secondary | ICD-10-CM | POA: Diagnosis not present

## 2019-08-18 DIAGNOSIS — N401 Enlarged prostate with lower urinary tract symptoms: Secondary | ICD-10-CM | POA: Diagnosis not present

## 2019-08-18 NOTE — Progress Notes (Signed)
08/31/2019  12:07 PM   Jeffery Campbell 05-Oct-1928 [id number]  Referring provider: Arnetha Courser, MD No address on file  Chief Complaint  Patient presents with  . Benign Prostatic Hypertrophy    HPI: Patient is an 86 year old male who returns today for the evaluation and management of BPH with LUTS.   BPH WITH LUTS  (prostate and/or bladder) IPSS score: 8/2    PVR: 45 mL   Previous score: 6/1   Previous PVR: 0 mL  Major complaint(s):  Intermittency, post-void dribbling and nocturia x 2-3.  Patient denies any modifying or aggravating factors.  Patient denies any gross hematuria, dysuria or suprapubic/flank pain.  Patient denies any fevers, chills, nausea or vomiting.   Currently taking: alfuzosin 10 mg daily     IPSS    Row Name 08/18/19 1100         International Prostate Symptom Score   How often have you had the sensation of not emptying your bladder?  Not at All     How often have you had to urinate less than every two hours?  Not at All     How often have you found you stopped and started again several times when you urinated?  Less than 1 in 5 times     How often have you found it difficult to postpone urination?  Not at All     How often have you had a weak urinary stream?  Almost always     How often have you had to strain to start urination?  Not at All     How many times did you typically get up at night to urinate?  2 Times     Total IPSS Score  8       Quality of Life due to urinary symptoms   If you were to spend the rest of your life with your urinary condition just the way it is now how would you feel about that?  Mostly Satisfied        Score:  1-7 Mild 8-19 Moderate 20-35 Severe   PMH: Past Medical History:  Diagnosis Date  . Arrhythmia   . Cough    CHRONIC  . Depression   . Disturbance of sleep   . Diverticulitis   . GERD (gastroesophageal reflux disease)   . Hemorrhoids   . Hypertension   . OA (osteoarthritis)   . Pure  hypercholesterolemia   . Sleep apnea   . Stroke [facility])     Surgical History: Past Surgical History:  Procedure Laterality Date  . CATARACT EXTRACTION W/PHACO Left 02/14/2016   Procedure: CATARACT EXTRACTION PHACO AND INTRAOCULAR LENS PLACEMENT (IOC);  Surgeon: Birder Robson, MD;  Location: [facility] ORS;  Service: Ophthalmology;  Laterality: Left;  [place] 53.7 AP% 21.5 CDE 11.58 FLUID PACK LOT # [id number]  . CATARACT EXTRACTION W/PHACO Right 12/18/2018   Procedure: CATARACT EXTRACTION PHACO AND INTRAOCULAR LENS PLACEMENT ([place])  RIGHT, VISION BLUE;  Surgeon: Marchia Meiers, MD;  Location: [facility] ORS;  Service: Ophthalmology;  Laterality: Right;  [place]  01:14 CDE 11.82 Fluid pack lot # [id number]  . COLONOSCOPY    . KNEE ARTHROSCOPY    . TRANSURETHRAL RESECTION OF PROSTATE      Home Medications:  Allergies as of 08/18/2019   No Known Allergies     Medication List       Accurate as of Aug 18, 2019 11:59 PM. If you have any questions, ask your nurse or doctor.  acetaminophen 325 MG tablet Commonly known as: TYLENOL Take 325-650 mg by mouth every 6 (six) hours as needed for moderate pain or headache.   alfuzosin 10 MG 24 hr tablet Commonly known as: UROXATRAL Take 1 tablet (10 mg total) by mouth daily with breakfast.   atorvastatin 20 MG tablet Commonly known as: LIPITOR TAKE 1 TABLET BY MOUTH EVERYDAY AT BEDTIME   B-12 3000 MCG Subl Place 3,000 mcg under the tongue daily.   busPIRone 5 MG tablet Commonly known as: BUSPAR TAKE 1 TABLET BY MOUTH 2 TIMES DAILY AS NEEDED.   clopidogrel 75 MG tablet Commonly known as: PLAVIX Take 1 tablet (75 mg total) by mouth daily. (stop aggrenox)   diltiazem 120 MG 24 hr capsule Commonly known as: CARDIZEM CD TAKE 1 CAPSULE BY MOUTH EVERY DAY   docusate sodium 100 MG capsule Commonly known as: COLACE Take 100 mg by mouth daily as needed for mild constipation.   famotidine 20 MG tablet Commonly known as: PEPCID TAKE 1 TABLET (20 MG  TOTAL) BY MOUTH 2 (TWO) TIMES DAILY AS NEEDED FOR HEARTBURN OR INDIGESTION.   finasteride 5 MG tablet Commonly known as: Proscar Take 1 tablet (5 mg total) by mouth daily.   fluticasone 50 MCG/ACT nasal spray Commonly known as: FLONASE Place 2 sprays into both nostrils daily as needed for allergies or rhinitis.   GNP MENS PROSTATE HEALTH PO Take by mouth.   levocetirizine 5 MG tablet Commonly known as: XYZAL Take 1 tablet (5 mg total) by mouth every evening.   meclizine 25 MG tablet Commonly known as: ANTIVERT TAKE 1/2 TO 1 TABLET (12.5-25 MG TOTAL) BY MOUTH 3 (THREE) TIMES DAILY AS NEEDED FOR DIZZINESS.   Omega 3 1200 MG Caps Take 1,200 mg by mouth daily.   Systane Balance 0.6 % Soln Generic drug: Propylene Glycol Place 1 drop into both eyes 3 (three) times daily as needed (dry eyes).       Allergies: No Known Allergies  Family History: Family History  Problem Relation Age of Onset  . Hypertension Son        Posey Rea of parents health.  . Prostate cancer Neg Hx   . Bladder Cancer Neg Hx   . Kidney cancer Neg Hx     Social History:  reports that he has never smoked. He has never used smokeless tobacco. He reports that he does not drink alcohol or use drugs.  ROS: For pertinent review of systems please refer to history of present illness  Physical Exam: BP (!) 191/83   Pulse 68   Ht 6\' 3"  (1.905 m)   Wt 246 lb (111.6 kg)   BMI 30.75 kg/m   Constitutional:  Well nourished. Alert and oriented, No acute distress. HEENT: [place] AT, mask in place.  Trachea midline Cardiovascular: No clubbing, cyanosis, or edema. Respiratory: Normal respiratory effort, no increased work of breathing. Neurologic: Grossly intact, no focal deficits, moving all 4 extremities. Psychiatric: Normal mood and affect.  Laboratory Data: Lab Results  Component Value Date   WBC 6.7 02/11/2019   HGB 16.8 02/11/2019   HCT 48.9 02/11/2019   MCV 90.2 02/11/2019   PLT 211 02/11/2019    Lab  Results  Component Value Date   CREATININE 1.15 02/11/2019    Lab Results  Component Value Date   HGBA1C 5.1 04/09/2018    Lab Results  Component Value Date   TSH 2.090 12/29/2014       Component Value Date/Time   CHOL 131 04/09/2018  0820   CHOL 134 09/22/2014 0810   HDL 41 04/09/2018 0820   HDL 46 09/22/2014 0810   CHOLHDL 3.2 04/09/2018 0820   VLDL 9 10/12/2016 0813   LDLCALC 71 04/09/2018 0820    Lab Results  Component Value Date   AST 17 02/24/2018   Lab Results  Component Value Date   ALT 14 02/24/2018    Results for orders placed or performed in visit on 08/18/19  Bladder Scan (Post Void Residual) in office  Result Value Ref Range   Scan Result 45 mL    I have reviewed the labs.  Pertinent Imaging Results for CAP, MASSI (MRN [id number]) as of 08/31/2019 12:02  Ref. Range 08/31/2019 11:59  Scan Result Unknown 45 mL    Assessment & Plan:    1. BPH with LU TS I PSS score 8/2, it is worse  Continue conservative management, avoiding bladder irritants and timed voiding's Continue alfuzosin 10 mg Restart finasteride 5 mg daily Follow-up in 3 months for I PSS and PVR  Return in about 3 months (around 11/18/2019) for IPSS and PVR.  These notes generated with voice recognition software. I apologize for typographical errors.  Michiel Cowboy, PA-C  [facility] [address]  [address] [place], [place] [address] [phone]

## 2019-08-27 DIAGNOSIS — G4736 Sleep related hypoventilation in conditions classified elsewhere: Secondary | ICD-10-CM | POA: Diagnosis not present

## 2019-08-27 DIAGNOSIS — G4733 Obstructive sleep apnea (adult) (pediatric): Secondary | ICD-10-CM | POA: Diagnosis not present

## 2019-08-27 DIAGNOSIS — D751 Secondary polycythemia: Secondary | ICD-10-CM | POA: Diagnosis not present

## 2019-08-31 LAB — BLADDER SCAN AMB NON-IMAGING: Scan Result: 45

## 2019-09-02 ENCOUNTER — Other Ambulatory Visit: Payer: Self-pay

## 2019-09-02 DIAGNOSIS — F419 Anxiety disorder, unspecified: Secondary | ICD-10-CM

## 2019-09-02 MED ORDER — BUSPIRONE HCL 5 MG PO TABS: ORAL_TABLET | ORAL | 3 refills | Status: DC

## 2019-09-03 ENCOUNTER — Other Ambulatory Visit: Payer: Self-pay | Admitting: Family Medicine

## 2019-09-03 DIAGNOSIS — J329 Chronic sinusitis, unspecified: Secondary | ICD-10-CM

## 2019-09-24 ENCOUNTER — Ambulatory Visit: Payer: PPO | Admitting: Urology

## 2019-09-25 ENCOUNTER — Other Ambulatory Visit: Payer: Self-pay | Admitting: Family Medicine

## 2019-09-25 DIAGNOSIS — J329 Chronic sinusitis, unspecified: Secondary | ICD-10-CM

## 2019-09-27 DIAGNOSIS — G4733 Obstructive sleep apnea (adult) (pediatric): Secondary | ICD-10-CM | POA: Diagnosis not present

## 2019-09-27 DIAGNOSIS — D751 Secondary polycythemia: Secondary | ICD-10-CM | POA: Diagnosis not present

## 2019-09-27 DIAGNOSIS — G4736 Sleep related hypoventilation in conditions classified elsewhere: Secondary | ICD-10-CM | POA: Diagnosis not present

## 2019-10-05 DIAGNOSIS — H938X1 Other specified disorders of right ear: Secondary | ICD-10-CM | POA: Diagnosis not present

## 2019-10-05 DIAGNOSIS — H6123 Impacted cerumen, bilateral: Secondary | ICD-10-CM | POA: Diagnosis not present

## 2019-10-05 DIAGNOSIS — H6121 Impacted cerumen, right ear: Secondary | ICD-10-CM | POA: Diagnosis not present

## 2019-10-08 ENCOUNTER — Other Ambulatory Visit: Payer: Self-pay

## 2019-10-08 DIAGNOSIS — E782 Mixed hyperlipidemia: Secondary | ICD-10-CM

## 2019-10-08 MED ORDER — ATORVASTATIN CALCIUM 20 MG PO TABS: ORAL_TABLET | ORAL | 3 refills | Status: DC

## 2019-10-13 ENCOUNTER — Telehealth: Payer: Self-pay

## 2019-10-13 DIAGNOSIS — F419 Anxiety disorder, unspecified: Secondary | ICD-10-CM

## 2019-10-14 ENCOUNTER — Telehealth: Payer: Self-pay | Admitting: Internal Medicine

## 2019-10-14 DIAGNOSIS — J301 Allergic rhinitis due to pollen: Secondary | ICD-10-CM

## 2019-10-15 NOTE — Telephone Encounter (Signed)
Not a [place] [employer]

## 2019-10-16 MED ORDER — MECLIZINE HCL 25 MG PO TABS: ORAL_TABLET | ORAL | 2 refills | Status: DC

## 2019-10-16 NOTE — Telephone Encounter (Signed)
Prescription sent for one month per Danelle Berry, PA-C. Patient has f/u scheduled for 11/12/19.

## 2019-10-16 NOTE — Telephone Encounter (Signed)
Pt called about refill for meclizine (ANTIVERT) 25 MG tablet   /pt states he is out / please call pt and advise

## 2019-10-16 NOTE — Addendum Note (Signed)
Addended by: Marta Antu on: 10/16/2019 09:12 AM   Modules accepted: Orders

## 2019-10-21 ENCOUNTER — Other Ambulatory Visit: Payer: Self-pay | Admitting: Urology

## 2019-10-26 ENCOUNTER — Ambulatory Visit: Payer: Self-pay

## 2019-10-26 NOTE — Telephone Encounter (Signed)
Patient called with question. He wanted to know if he was on medication that would help with urination frequency. Patient reports up 3 x per night. Medications Alfuzosin and Proscar were explained to patient. He was advised to contact his pharmacy for information and if necessary reorder of medications needed. He verbalized understanding and will call back. Patient has appointment this week in office.  Reason for Disposition  [1] Caller has medicine question about med NOT prescribed by PCP AND [2] triager unable to answer question (e.g., compatibility with other med, storage)  Answer Assessment - Initial Assessment Questions 1. NAME of MEDICATION: "What medicine are you calling about?"     Just need to know what I an taking for urination all nite 2. QUESTION: "What is your question?" (e.g., medication refill, side effect)     Am I on medicine that helps with frequent urination at night 3. PRESCRIBING HCP: "Who prescribed it?" Reason: if prescribed by specialist, call should be referred to that group.     Shannon McGowen PA 4. SYMPTOMS: "Do you have any symptoms?"     Urinating all night 5. SEVERITY: If symptoms are present, ask "Are they mild, moderate or severe?"    Urination 3 x per night 6. PREGNANCY:  "Is there any chance that you are pregnant?" "When was your last menstrual period?"    N/A  Protocols used: MEDICATION QUESTION CALL-A-AH

## 2019-10-27 ENCOUNTER — Other Ambulatory Visit: Payer: Self-pay | Admitting: Urology

## 2019-10-27 DIAGNOSIS — G4733 Obstructive sleep apnea (adult) (pediatric): Secondary | ICD-10-CM | POA: Diagnosis not present

## 2019-10-27 DIAGNOSIS — D751 Secondary polycythemia: Secondary | ICD-10-CM | POA: Diagnosis not present

## 2019-10-27 DIAGNOSIS — G4736 Sleep related hypoventilation in conditions classified elsewhere: Secondary | ICD-10-CM | POA: Diagnosis not present

## 2019-10-29 ENCOUNTER — Other Ambulatory Visit: Payer: Self-pay

## 2019-10-29 ENCOUNTER — Ambulatory Visit (INDEPENDENT_AMBULATORY_CARE_PROVIDER_SITE_OTHER): Payer: PPO

## 2019-10-29 DIAGNOSIS — Z0000 Encounter for general adult medical examination without abnormal findings: Secondary | ICD-10-CM

## 2019-10-29 NOTE — Patient Instructions (Signed)
Jeffery Campbell , Thank you for taking time to come for your Medicare Wellness Visit. I appreciate your ongoing commitment to your health goals. Please review the following plan we discussed and let me know if I can assist you in the future.   Screening recommendations/referrals: Colonoscopy: no longer required Recommended yearly ophthalmology/optometry visit for glaucoma screening and checkup Recommended yearly dental visit for hygiene and checkup  Vaccinations: Influenza vaccine: done 12/01/18 Pneumococcal vaccine: done 03/17/14 Tdap vaccine: due Shingles vaccine: Shingrix discussed. Please contact your pharmacy for coverage information.  Covid-19: please bring your vaccination record to your next appt  Advanced directives: Advance directive discussed with you today. Even though you declined this today please call our office should you change your mind and we can give you the proper paperwork for you to fill out.  Conditions/risks identified: Recommend increasing physical activity   Next appointment: Follow up in one year for your annual wellness visit.   Preventive Care 84 Years and Older, Male Preventive care refers to lifestyle choices and visits with your health care provider that can promote health and wellness. What does preventive care include?  A yearly physical exam. This is also called an annual well check.  Dental exams once or twice a year.  Routine eye exams. Ask your health care provider how often you should have your eyes checked.  Personal lifestyle choices, including:  Daily care of your teeth and gums.  Regular physical activity.  Eating a healthy diet.  Avoiding tobacco and drug use.  Limiting alcohol use.  Practicing safe sex.  Taking low doses of aspirin every day.  Taking vitamin and mineral supplements as recommended by your health care provider. What happens during an annual well check? The services and screenings done by your health care provider  during your annual well check will depend on your age, overall health, lifestyle risk factors, and family history of disease. Counseling  Your health care provider may ask you questions about your:  Alcohol use.  Tobacco use.  Drug use.  Emotional well-being.  Home and relationship well-being.  Sexual activity.  Eating habits.  History of falls.  Memory and ability to understand (cognition).  Work and work Astronomer. Screening  You may have the following tests or measurements:  Height, weight, and BMI.  Blood pressure.  Lipid and cholesterol levels. These may be checked every 5 years, or more frequently if you are over 69 years old.  Skin check.  Lung cancer screening. You may have this screening every year starting at age 38 if you have a 30-pack-year history of smoking and currently smoke or have quit within the past 15 years.  Fecal occult blood test (FOBT) of the stool. You may have this test every year starting at age 73.  Flexible sigmoidoscopy or colonoscopy. You may have a sigmoidoscopy every 5 years or a colonoscopy every 10 years starting at age 52.  Prostate cancer screening. Recommendations will vary depending on your family history and other risks.  Hepatitis C blood test.  Hepatitis B blood test.  Sexually transmitted disease (STD) testing.  Diabetes screening. This is done by checking your blood sugar (glucose) after you have not eaten for a while (fasting). You may have this done every 1-3 years.  Abdominal aortic aneurysm (AAA) screening. You may need this if you are a current or former smoker.  Osteoporosis. You may be screened starting at age 17 if you are at high risk. Talk with your health care provider about your  test results, treatment options, and if necessary, the need for more tests. Vaccines  Your health care provider may recommend certain vaccines, such as:  Influenza vaccine. This is recommended every year.  Tetanus,  diphtheria, and acellular pertussis (Tdap, Td) vaccine. You may need a Td booster every 10 years.  Zoster vaccine. You may need this after age 18.  Pneumococcal 13-valent conjugate (PCV13) vaccine. One dose is recommended after age 41.  Pneumococcal polysaccharide (PPSV23) vaccine. One dose is recommended after age 49. Talk to your health care provider about which screenings and vaccines you need and how often you need them. This information is not intended to replace advice given to you by your health care provider. Make sure you discuss any questions you have with your health care provider. Document Released: 04/15/2015 Document Revised: 12/07/2015 Document Reviewed: 01/18/2015 Elsevier Interactive Patient Education  2017 [employer] Prevention in the Home Falls can cause injuries. They can happen to people of all ages. There are many things you can do to make your home safe and to help prevent falls. What can I do on the outside of my home?  Regularly fix the edges of walkways and driveways and fix any cracks.  Remove anything that might make you trip as you walk through a door, such as a raised step or threshold.  Trim any bushes or trees on the path to your home.  Use bright outdoor lighting.  Clear any walking paths of anything that might make someone trip, such as rocks or tools.  Regularly check to see if handrails are loose or broken. Make sure that both sides of any steps have handrails.  Any raised decks and porches should have guardrails on the edges.  Have any leaves, snow, or ice cleared regularly.  Use sand or salt on walking paths during winter.  Clean up any spills in your garage right away. This includes oil or grease spills. What can I do in the bathroom?  Use night lights.  Install grab bars by the toilet and in the tub and shower. Do not use towel bars as grab bars.  Use non-skid mats or decals in the tub or shower.  If you need to sit down in  the shower, use a plastic, non-slip stool.  Keep the floor dry. Clean up any water that spills on the floor as soon as it happens.  Remove soap buildup in the tub or shower regularly.  Attach bath mats securely with double-sided non-slip rug tape.  Do not have throw rugs and other things on the floor that can make you trip. What can I do in the bedroom?  Use night lights.  Make sure that you have a light by your bed that is easy to reach.  Do not use any sheets or blankets that are too big for your bed. They should not hang down onto the floor.  Have a firm chair that has side arms. You can use this for support while you get dressed.  Do not have throw rugs and other things on the floor that can make you trip. What can I do in the kitchen?  Clean up any spills right away.  Avoid walking on wet floors.  Keep items that you use a lot in easy-to-reach places.  If you need to reach something above you, use a strong step stool that has a grab bar.  Keep electrical cords out of the way.  Do not use floor polish or wax that  makes floors slippery. If you must use wax, use non-skid floor wax.  Do not have throw rugs and other things on the floor that can make you trip. What can I do with my stairs?  Do not leave any items on the stairs.  Make sure that there are handrails on both sides of the stairs and use them. Fix handrails that are broken or loose. Make sure that handrails are as long as the stairways.  Check any carpeting to make sure that it is firmly attached to the stairs. Fix any carpet that is loose or worn.  Avoid having throw rugs at the top or bottom of the stairs. If you do have throw rugs, attach them to the floor with carpet tape.  Make sure that you have a light switch at the top of the stairs and the bottom of the stairs. If you do not have them, ask someone to add them for you. What else can I do to help prevent falls?  Wear shoes that:  Do not have high  heels.  Have rubber bottoms.  Are comfortable and fit you well.  Are closed at the toe. Do not wear sandals.  If you use a stepladder:  Make sure that it is fully opened. Do not climb a closed stepladder.  Make sure that both sides of the stepladder are locked into place.  Ask someone to hold it for you, if possible.  Clearly mark and make sure that you can see:  Any grab bars or handrails.  First and last steps.  Where the edge of each step is.  Use tools that help you move around (mobility aids) if they are needed. These include:  Canes.  Walkers.  Scooters.  Crutches.  Turn on the lights when you go into a dark area. Replace any light bulbs as soon as they burn out.  Set up your furniture so you have a clear path. Avoid moving your furniture around.  If any of your floors are uneven, fix them.  If there are any pets around you, be aware of where they are.  Review your medicines with your doctor. Some medicines can make you feel dizzy. This can increase your chance of falling. Ask your doctor what other things that you can do to help prevent falls. This information is not intended to replace advice given to you by your health care provider. Make sure you discuss any questions you have with your health care provider. Document Released: 01/13/2009 Document Revised: 08/25/2015 Document Reviewed: 04/23/2014 Elsevier Interactive Patient Education  2017 [employer].

## 2019-10-29 NOTE — Progress Notes (Signed)
Subjective:   Chue Berkovich is a 86 y.o. male who presents for Medicare Annual/Subsequent preventive examination.  Virtual Visit via Telephone Note  I connected with  Peyson Postema on 10/29/19 at  2:10 PM EDT by telephone and verified that I am speaking with the correct person using two identifiers.  Medicare Annual Wellness visit completed telephonically due to Covid-19 pandemic.   Location: Patient: home Provider: office   I discussed the limitations, risks, security and privacy concerns of performing an evaluation and management service by telephone and the availability of in person appointments. The patient expressed understanding and agreed to proceed.  Unable to perform video visit due to video visit attempted and failed and/or patient does not have video capability.   Some vital signs may be absent or patient reported.   Reather Littler, LPN    Review of Systems     Cardiac Risk Factors include: advanced age (>32men, >65 women);hypertension;male gender;dyslipidemia;obesity (BMI >30kg/m2)     Objective:    There were no vitals filed for this visit. There is no height or weight on file to calculate BMI.  Advanced Directives 10/29/2019 02/10/2019 10/28/2018 08/13/2018 02/24/2018 01/31/2018 01/27/2018  Does Patient Have a Medical Advance Directive? No Yes Yes No Yes Yes No  Type of Advance Directive - Healthcare Power of [place];Living will Healthcare Power of [place];Living will - Living will Living will -  Does patient want to make changes to medical advance directive? - No - Patient declined - No - Patient declined - - -  Copy of Healthcare Power of Attorney in Chart? - No - copy requested No - copy requested - - - -  Would patient like information on creating a medical advance directive? No - Patient declined No - Patient declined - - - - No - Patient declined    Current Medications (verified) Outpatient Encounter Medications as of 10/29/2019  Medication Sig  . acetaminophen  (TYLENOL) 325 MG tablet Take 325-650 mg by mouth every 6 (six) hours as needed for moderate pain or headache.  . alfuzosin (UROXATRAL) 10 MG 24 hr tablet TAKE 1 TABLET (10 MG TOTAL) BY MOUTH DAILY WITH BREAKFAST.  [name] atorvastatin (LIPITOR) 20 MG tablet TAKE 1 TABLET BY MOUTH EVERYDAY AT BEDTIME  . busPIRone (BUSPAR) 5 MG tablet TAKE 1 TABLET BY MOUTH 2 TIMES DAILY AS NEEDED.  [name] clopidogrel (PLAVIX) 75 MG tablet Take 1 tablet (75 mg total) by mouth daily. (stop aggrenox)  . Cyanocobalamin (B-12) 3000 MCG SUBL Place 3,000 mcg under the tongue daily.   [name] diltiazem (CARDIZEM CD) 120 MG 24 hr capsule TAKE 1 CAPSULE BY MOUTH EVERY DAY  . docusate sodium (COLACE) 100 MG capsule Take 100 mg by mouth daily as needed for mild constipation.  . famotidine (PEPCID) 20 MG tablet TAKE 1 TABLET (20 MG TOTAL) BY MOUTH 2 (TWO) TIMES DAILY AS NEEDED FOR HEARTBURN OR INDIGESTION.  . finasteride (PROSCAR) 5 MG tablet TAKE 1 TABLET BY MOUTH EVERY DAY  . fluticasone (FLONASE) 50 MCG/ACT nasal spray PLACE 2 SPRAYS INTO BOTH NOSTRILS DAILY AS NEEDED FOR ALLERGIES OR RHINITIS.  [name] levocetirizine (XYZAL) 5 MG tablet TAKE 1 TABLET BY MOUTH EVERY DAY IN THE EVENING  . meclizine (ANTIVERT) 25 MG tablet TAKE 1/2 TO 1 TABLET (12.5-25 MG TOTAL) BY MOUTH 3 (THREE) TIMES DAILY AS NEEDED FOR DIZZINESS.  . Omega 3 1200 MG CAPS Take 1,200 mg by mouth daily.   [name] Propylene Glycol (SYSTANE BALANCE) 0.6 % SOLN Place 1 drop into  both eyes 3 (three) times daily as needed (dry eyes).  . Misc Natural Products (GNP MENS PROSTATE HEALTH PO) Take by mouth. (Patient not taking: Reported on 10/29/2019)   No facility-administered encounter medications on file as of 10/29/2019.    Allergies (verified) Patient has no known allergies.   History: Past Medical History:  Diagnosis Date  . Arrhythmia   . Cough    CHRONIC  . Depression   . Disturbance of sleep   . Diverticulitis   . GERD (gastroesophageal reflux disease)   . Hemorrhoids   .  Hypertension   . OA (osteoarthritis)   . Pure hypercholesterolemia   . Sleep apnea   . Stroke [facility](HCC)    Past Surgical History:  Procedure Laterality Date  . CATARACT EXTRACTION W/PHACO Left 02/14/2016   Procedure: CATARACT EXTRACTION PHACO AND INTRAOCULAR LENS PLACEMENT (IOC);  Surgeon: Galen ManilaWilliam Porfilio, MD;  Location: [facility] ORS;  Service: Ophthalmology;  Laterality: Left;  US 53.7 AP% 21.5 CDE 11.58 FLUID PACK LOT # [id number]  . CATARACT EXTRACTION W/PHACO Right 12/18/2018   Procedure: CATARACT EXTRACTION PHACO AND INTRAOCULAR LENS PLACEMENT (IOC)  RIGHT, VISION BLUE;  Surgeon: Elliot CousinHarrow, Brian, MD;  Location: [facility] ORS;  Service: Ophthalmology;  Laterality: Right;  US  01:14 CDE 11.82 Fluid pack lot # [id number]  . COLONOSCOPY    . KNEE ARTHROSCOPY    . TRANSURETHRAL RESECTION OF PROSTATE     Family History  Problem Relation Age of Onset  . Hypertension Son        Posey ReaUnsure of parents health.  . Prostate cancer Neg Hx   . Bladder Cancer Neg Hx   . Kidney cancer Neg Hx    Social History   Socioeconomic History  . Marital status: Divorced    Spouse name: Not on file  . Number of children: 3  . Years of education: Not on file  . Highest education level: 11th grade  Occupational History  . Occupation: Retired  Tobacco Use  . Smoking status: Never Smoker  . Smokeless tobacco: Never Used  . Tobacco comment: smoking cessation materials not required  Vaping Use  . Vaping Use: Never used  Substance and Sexual Activity  . Alcohol use: No    Alcohol/week: 0.0 standard drinks  . Drug use: No  . Sexual activity: Not Currently  Other Topics Concern  . Not on file  Social History Narrative   Lives at home alone.   Writes left-handed - does everything else with right-hand.   No caffeine use.      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    Social Determinants of Health   Financial Resource Strain: Low Risk   .  Difficulty of Paying Living Expenses: Not hard at all  Food Insecurity: No Food Insecurity  . Worried About Programme researcher, broadcasting/film/videounning Out of Food in the Last Year: Never true  . Ran Out of Food in the Last Year: Never true  Transportation Needs: No Transportation Needs  . Lack of Transportation (Medical): No  . Lack of Transportation (Non-Medical): No  Physical Activity: Inactive  . Days of Exercise per Week: 0 days  . Minutes of Exercise per Session: 0 min  Stress: No Stress Concern Present  . Feeling of Stress : Not at all  Social Connections: Moderately Isolated  . Frequency of Communication with Friends and Family: More than three times a week  . Frequency of Social Gatherings with Friends and Family:  Twice a week  . Attends Religious Services: More than 4 times per year  . Active Member of Clubs or Organizations: No  . Attends Banker Meetings: Never  . Marital Status: Divorced    Tobacco Counseling Counseling given: Not Answered Comment: smoking cessation materials not required   Clinical Intake:  Pre-visit preparation completed: Yes  Pain : No/denies pain     Nutritional Risks: None Diabetes: No  How often do you need to have someone help you when you read instructions, pamphlets, or other written materials from your doctor or pharmacy?: 1 - Never    Interpreter Needed?: No  Information entered by :: Reather Littler LPN   Activities of Daily Living In your present state of health, do you have any difficulty performing the following activities: 10/29/2019 07/13/2019  Hearing? N N  Comment declines hearing aids -  Vision? N N  Difficulty concentrating or making decisions? N N  Walking or climbing stairs? N N  Dressing or bathing? N N  Doing errands, shopping? N N  Preparing Food and eating ? N -  Using the Toilet? N -  In the past six months, have you accidently leaked urine? N -  Do you have problems with loss of bowel control? N -  Managing your Medications? N -   Managing your Finances? N -  Housekeeping or managing your Housekeeping? N -  Some recent data might be hidden    Patient Care Team: Jamelle Haring, MD as PCP - General (Internal Medicine) Garen Grams, MD as Consulting Physician (Cardiology)  Indicate any recent Medical Services you may have received from other than [facility] providers in the past year (date may be approximate).     Assessment:   This is a routine wellness examination for Breyer.  Hearing/Vision screen  Hearing Screening             Right ear:           Left ear:           Comments: Pt denies hearing difficulty  Vision Screening Comments: [facility] screen[facility] with [facility]  Dietary issues and exercise activities discussed: Current Exercise Habits: The patient does not participate in regular exercise at present, Exercise limited by: orthopedic condition(s)  Goals    .  DIET - INCREASE WATER INTAKE      Recommend to drink at least 6-8 8oz glasses of water per day.    .  There is other people that are calling me from [facility] (pt-stated)      Current Barriers:  [name] Knowledge Deficits related to understanding [facility] services  Nurse Case Manager Clinical Goal(s):  [name] Over the next 30 days, patient will work with [facility]  to address needs related to chronic health management  Interventions:  . Advised patient to continue to engage with [facility] . Provided education to patient re: services [facility] provides . Reviewed medications with patient and discussed importance of medication adherence . Discussed plans with patient for ongoing care management follow up and provided patient with direct contact information for care management team  Patient Self Care Activities:  . Self administers medications as prescribed . Attends all scheduled provider appointments . Calls pharmacy for medication refills . Attends church or other social  activities . Performs ADL's independently . Performs IADL's independently . Calls provider office for new concerns or questions  . Continues to Engage with [employer]  Initial goal documentation  Depression Screen PHQ 2/9 Scores 10/29/2019 07/13/2019 06/11/2019 04/10/2019 03/13/2019 01/21/2019 11/26/2018  PHQ - 2 Score 0 0 0 0 0 0 0  PHQ- 9 Score - 0 0 0 0 0 0    Fall Risk Fall Risk  10/29/2019 07/13/2019 06/11/2019 04/10/2019 03/13/2019  Falls in the past year? 0 0 0 0 0  Number falls in past yr: 0 0 0 0 0  Injury with Fall? 0 0 0 0 0  Risk for fall due to : Impaired balance/gait;Impaired mobility - - - -  Risk for fall due to: Comment - - - - -  Follow up Falls prevention discussed - - - Falls evaluation completed    Any stairs in or around the home? Yes  If so, are there any without handrails? No    Home free of loose throw rugs in walkways, pet beds, electrical cords, etc? Yes  Adequate lighting in your home to reduce risk of falls? Yes   ASSISTIVE DEVICES UTILIZED TO PREVENT FALLS:  Life alert? No  Use of a cane, walker or w/c? Yes  Grab bars in the bathroom? Yes  Shower chair or bench in shower? Yes  Elevated toilet seat or a handicapped toilet? Yes   TIMED UP AND GO:  Was the test performed? No . Telephonic visit.   Cognitive Function: MMSE - Mini Mental State Exam 10/26/2014  Orientation to time 4  Orientation to Place 4  Registration 3  Attention/ Calculation 3  Recall 1  Language- name 2 objects 2  Language- repeat 1  Language- follow 3 step command 3  Language- read & follow direction 1  Write a sentence 1  Copy design 1  Total score 24     6CIT Screen 10/29/2019 10/28/2018 10/24/2017  What Year? 0 points 0 points 0 points  What month? 0 points 0 points 0 points  What time? 0 points 0 points 3 points  Count back from 20 0 points 0 points 0 points  Months in reverse 2 points 2 points 4 points  Repeat phrase 4 points 6 points 2 points  Total  Score 6 8 9     Immunizations Immunization History  Administered Date(s) Administered  . Influenza Split 02/17/2006, 12/30/2006, 01/20/2010  . Influenza, High Dose Seasonal PF 12/29/2014, 12/24/2016, 11/29/2017, 12/01/2018  . Influenza, Seasonal, Injecte, Preservative Fre 01/24/2011, 01/02/2012, 12/24/2012  . Influenza,inj,Quad PF,6+ Mos 12/09/2013  . Influenza-Unspecified 11/28/2015, 12/24/2016, 11/14/2017  . Pneumococcal Conjugate-13 01/25/2014, 03/17/2014  . Pneumococcal Polysaccharide-23 09/23/2006, 12/30/2006  . Td 02/26/2005  . Zoster 01/24/2011    TDAP status: Due, Education has been provided regarding the importance of this vaccine. Advised may receive this vaccine at local pharmacy or Health Dept. Aware to provide a copy of the vaccination record if obtained from local pharmacy or Health Dept. Verbalized acceptance and understanding.   Flu Vaccine status: Up to date   Pneumococcal vaccine status: Up to date   Covid-19 vaccine status: Completed vaccines - pt advised to bring vaccine record to   Qualifies for Shingles Vaccine? Yes   Zostavax completed No   Shingrix Completed?: No.    Education has been provided regarding the importance of this vaccine. Patient has been advised to call insurance company to determine out of pocket expense if they have not yet received this vaccine. Advised may also receive vaccine at local pharmacy or Health Dept. Verbalized acceptance and understanding.  Screening Tests Health Maintenance  Topic Date Due  . COVID-19 Vaccine (1) Never done  .  TETANUS/TDAP  02/27/2015  . INFLUENZA VACCINE  11/01/2019  . PNA vac Low Risk Adult  Completed    Health Maintenance  Health Maintenance Due  Topic Date Due  . COVID-19 Vaccine (1) Never done  . TETANUS/TDAP  02/27/2015    Colorectal cancer screening: No longer required.   Lung Cancer Screening: (Low Dose CT Chest recommended if Age 23-80 years, 30 pack-year currently smoking OR have quit w/in  15years.) does not qualify.   Additional Screening:  Hepatitis C Screening: no longer required  Vision Screening: Recommended annual ophthalmology exams for early detection of glaucoma and other disorders of the eye. Is the patient up to date with their annual eye exam?  Yes  Who is the provider or what is the name of the office in which the patient attends annual eye exams? [facility]  Dental Screening: Recommended annual dental exams for proper oral hygiene  Community Resource Referral / Chronic Care Management: CRR required this visit?  No   CCM required this visit?  No      Plan:     I have personally reviewed and noted the following in the patient's chart:   . Medical and social history . Use of alcohol, tobacco or illicit drugs  . Current medications and supplements . Functional ability and status . Nutritional status . Physical activity . Advanced directives . List of other physicians . Hospitalizations, surgeries, and ER visits in previous 12 months . Vitals . Screenings to include cognitive, depression, and falls . Referrals and appointments  In addition, I have reviewed and discussed with patient certain preventive protocols, quality metrics, and best practice recommendations. A written personalized care plan for preventive services as well as general preventive health recommendations were provided to patient.     Reather Littler, LPN   [id number]/51/[id number]   Nurse Notes: none

## 2019-11-11 ENCOUNTER — Other Ambulatory Visit: Payer: Self-pay

## 2019-11-11 DIAGNOSIS — F419 Anxiety disorder, unspecified: Secondary | ICD-10-CM

## 2019-11-11 MED ORDER — BUSPIRONE HCL 5 MG PO TABS: ORAL_TABLET | ORAL | 1 refills | Status: DC

## 2019-11-11 NOTE — Progress Notes (Signed)
Patient ID: Jeffery Campbell, male    DOB: 1928/08/31, 86 y.o.   MRN: [id number]  PCP: Jamelle Haring, MD  Chief Complaint  Patient presents with  . Follow-up    Subjective:   Jeffery Campbell is a 86 y.o. male, presents to clinic with CC of the following:  Chief Complaint  Patient presents with  . Follow-up    HPI:  Patient is an 86 year old male Last visit with me was in April 2021 Follows up today. Has been feeling ok. Occasionally wakes up intermittently feeling "Bad", not painful anywhere, no specific sx's and gets up and feels better after 30-60 minutes.  Denies any chest pains, palpitations, shortness of breath, no increased lower extremity swelling, no nausea or vomiting, no abdominal pains, no fevers, no muscle aches, no specific symptoms given other than just feeling "bad "and does not last very long.  Is not every morning, noted about 3 times in the past week.  Hyperlipidemia: Current Medication Regimen:lipitor 20 mg daily Takes medication regularly Last Lipids: Lab Results  Component Value Date   CHOL 131 04/09/2018   HDL 41 04/09/2018   LDLCALC 71 04/09/2018   TRIG 102 04/09/2018   CHOLHDL 3.2 04/09/2018    -Denies: myalgias.  Hypertension:  Medication regimen-diltiazem 120 mgdaily Takes medication regularly Not check blood pressures at home BP Readings from Last 3 Encounters:  11/12/19 132/70  08/18/19 (!) 191/83  02/11/19 (!) 144/69   Denies chest pain, palp's, shortness of breath, increased LE edema, increased headaches, vision changes  Anxiety and depression:  Denies any increased anxiety or depression sx's Medication regimen-BuSpar 5 mg twice daily, notes has been helpful. Was on Celexa prior but has stopped that.   BPH/ LUTS -seeing urology, last saw 08/18/2019 with the following assessment/plan: I PSS score 8/2, it is worse  Continue conservative management, avoiding bladder irritants and timed voiding's Continue alfuzosin  10 mg Restart finasteride 5 mg daily Follow-up in 3 months, has appointment scheduled in August  Erythrocytosis - followed by oncology and last visit 02/11/2019 and noted:              Erythrocytosis isolated-unclear etiology/howeverSECONDARY-negative Jak 2/normal erythropoietin levels.            Today hemoglobin 17; hematocrit48; patient is asymptomatic hold off phlebotomy.             NO phelbotomy today             Follow up with oncology again in56months/MD/lab- cbc/bmp/possible Phlebtotomy- Dr.B, has appointment scheduled in November              History of prediabetes/Obesity:    A1c's have been consistently below the prediabetic range for last 2+ years                Lab Results  Component Value Date   HGBA1C 5.1 04/09/2018   HGBA1C 4.9 10/07/2017   HGBA1C 5.1 04/08/2017   Lab Results  Component Value Date   LDLCALC 71 04/09/2018   CREATININE 1.15 02/11/2019   Wt Readings from Last 3 Encounters:  11/12/19 242 lb 1.6 oz (109.8 kg)  08/18/19 246 lb (111.6 kg)  07/13/19 247 lb 9.6 oz (112.3 kg)    Weight has been relatively stable, down a few pounds since May,  Diet-trying to eat a healthy diet, eats more vegetables,  Exercise-no regular exercise regimen, has been hot outside in the recent past  Constipation: Occasionally has a couple days without a bowel movement.  Feels uncomfortable at times.  He has been taking miralax, and notes that has been helpful.  No blood in stool, dark or tarry stools, or abdominal pain.               HX stroke: Stable,  taking plavix daily.              CKD - Stage 3 on last check Lab Results  Component Value Date   CREATININE 1.15 02/11/2019   BUN 14 02/11/2019   NA 139 02/11/2019   K 4.1 02/11/2019   CL 109 02/11/2019   CO2 24 02/11/2019     Severe OSA with nocturnal hypoxemia - last f/u with pulm was 08/19/2018  Has home CPAP and using at night presently, about 2-3 hours usually.  He is not convinced  that he needs to continue this, although stated he will after our discussion today as I do think it will be helpful.              GERD:   Medication regimen-Pepcid previously, not needed in recent past and denied any heartburn symptoms of concern  Tries to avoid triggers in his diet   denies chronic abdominal pain, black or dark stools or bleeding per rectum              OA: states he is doing well with his mobility, has not fallen in the recent past, and is careful getting around.  He notes he does have a cane and walker at home as was provided after his stroke. Uses walker when out and about and helpful.  No increased pain              Rhinitis -still taking flonase and antihistamine, notes is helpful.              Had cataract surgery 12/2018 and notes his vision has been okay after that surgery.   Had two doses of Covid vaccine.     Patient Active Problem List   Diagnosis Date Noted  . Anxiety with depression 07/13/2019  . Benign prostatic hyperplasia with nocturia 07/13/2019  . Recurrent major depressive disorder, in partial remission (HCC) 10/08/2018  . Erythrocytosis 01/31/2018  . Hx of completed stroke 01/10/2018  . Prediabetes 10/07/2017  . Dizziness 07/03/2017  . Abnormal gait 10/05/2016  . Hypoxia, sleep related 04/20/2016  . Elevated hematocrit 04/04/2016  . Right flank pain 01/10/2016  . General unsteadiness 05/24/2015  . Allergic rhinitis due to pollen 11/12/2014  . Back pain, chronic 10/21/2014  . Benign paroxysmal positional nystagmus 10/21/2014  . Decreased libido 10/21/2014  . Fatigue 10/21/2014  . Gastro-esophageal reflux disease without esophagitis 10/21/2014  . Dysmetabolic syndrome 10/21/2014  . Fungal infection of nail 10/21/2014  . HLD (hyperlipidemia) 08/19/2014  . CKD (chronic kidney disease) stage 3, GFR 30-59 ml/min 08/19/2014  . Class 1 obesity due to excess calories with serious comorbidity and body mass index (BMI) of 30.0 to 30.9 in adult    . Asymptomatic PVCs 05/27/2014  . Essential hypertension 04/05/2014  . Osteoarthrosis involving more than one site but not generalized 08/03/2008      Current Outpatient Medications:  .  acetaminophen (TYLENOL) 325 MG tablet, Take 325-650 mg by mouth every 6 (six) hours as needed for moderate pain or headache., Disp: , Rfl:  .  alfuzosin (UROXATRAL) 10 MG 24 hr tablet, TAKE 1 TABLET (10 MG TOTAL) BY MOUTH DAILY WITH  BREAKFAST., Disp: 90 tablet, Rfl: 0 .  atorvastatin (LIPITOR) 20 MG tablet, TAKE 1 TABLET BY MOUTH EVERYDAY AT BEDTIME, Disp: 90 tablet, Rfl: 3 .  busPIRone (BUSPAR) 5 MG tablet, TAKE 1 TABLET BY MOUTH 2 TIMES DAILY AS NEEDED., Disp: 180 tablet, Rfl: 1 .  clopidogrel (PLAVIX) 75 MG tablet, Take 1 tablet (75 mg total) by mouth daily. (stop aggrenox), Disp: 90 tablet, Rfl: 3 .  Cyanocobalamin (B-12) 3000 MCG SUBL, Place 3,000 mcg under the tongue daily. , Disp: 30 tablet, Rfl: 11 .  diltiazem (CARDIZEM CD) 120 MG 24 hr capsule, TAKE 1 CAPSULE BY MOUTH EVERY DAY, Disp: 90 capsule, Rfl: 1 .  docusate sodium (COLACE) 100 MG capsule, Take 100 mg by mouth daily as needed for mild constipation., Disp: , Rfl:  .  finasteride (PROSCAR) 5 MG tablet, TAKE 1 TABLET BY MOUTH EVERY DAY, Disp: 90 tablet, Rfl: 3 .  fluticasone (FLONASE) 50 MCG/ACT nasal spray, PLACE 2 SPRAYS INTO BOTH NOSTRILS DAILY AS NEEDED FOR ALLERGIES OR RHINITIS., Disp: 48 mL, Rfl: 2 .  levocetirizine (XYZAL) 5 MG tablet, TAKE 1 TABLET BY MOUTH EVERY DAY IN THE EVENING, Disp: 90 tablet, Rfl: 3 .  meclizine (ANTIVERT) 25 MG tablet, TAKE 1/2 TO 1 TABLET (12.5-25 MG TOTAL) BY MOUTH 3 (THREE) TIMES DAILY AS NEEDED FOR DIZZINESS., Disp: 30 tablet, Rfl: 2 .  Propylene Glycol (SYSTANE BALANCE) 0.6 % SOLN, Place 1 drop into both eyes 3 (three) times daily as needed (dry eyes)., Disp: , Rfl:    No Known Allergies   Past Surgical History:  Procedure Laterality Date  . CATARACT EXTRACTION W/PHACO Left 02/14/2016   Procedure:  CATARACT EXTRACTION PHACO AND INTRAOCULAR LENS PLACEMENT (IOC);  Surgeon: Galen Manila, MD;  Location: [facility];  Service: Ophthalmology;  Laterality: Left;  [place] 53.7 AP% 21.5 CDE 11.58 FLUID PACK LOT # [id number]  . CATARACT EXTRACTION W/PHACO Right 12/18/2018   Procedure: CATARACT EXTRACTION PHACO AND INTRAOCULAR LENS PLACEMENT (IOC)  RIGHT, VISION BLUE;  Surgeon: Elliot Cousin, MD;  Location: [facility];  Service: Ophthalmology;  Laterality: Right;  [place]  01:14 CDE 11.82 Fluid pack lot # [id number]  . COLONOSCOPY    . KNEE ARTHROSCOPY    . TRANSURETHRAL RESECTION OF PROSTATE       Family History  Problem Relation Age of Onset  . Hypertension Son        Posey Rea of parents health.  . Prostate cancer Neg Hx   . Bladder Cancer Neg Hx   . Kidney cancer Neg Hx      Social History   Tobacco Use  . Smoking status: Never Smoker  . Smokeless tobacco: Never Used  . Tobacco comment: smoking cessation materials not required  Substance Use Topics  . Alcohol use: No    Alcohol/week: 0.0 standard drinks    With staff assistance, above reviewed with the patient today.  ROS: As per HPI, otherwise no specific complaints on a limited and focused system review   No results found for this or any previous visit (from the past 72 hour(s)).   PHQ2/9: Depression screen [facility] 2/9 11/12/2019 10/29/2019 07/13/2019 06/11/2019 04/10/2019  Decreased Interest 0 0 0 0 0  Down, Depressed, Hopeless 0 0 0 0 0  PHQ - 2 Score 0 0 0 0 0  Altered sleeping 0 - 0 0 0  Tired, decreased energy 0 - 0 0 0  Change in appetite 0 - 0 0 0  Feeling bad or failure about yourself  0 -  0 0 0  Trouble concentrating 0 - 0 0 0  Moving slowly or fidgety/restless 0 - 0 0 0  Suicidal thoughts 0 - 0 0 0  PHQ-9 Score 0 - 0 0 0  Difficult doing work/chores Not difficult at all - Not difficult at all Not difficult at all Not difficult at all  Some recent data might be hidden   PHQ-2/9 Result is neg  Fall Risk: Fall Risk  11/12/2019  10/29/2019 07/13/2019 06/11/2019 04/10/2019  Falls in the past year? 0 0 0 0 0  Number falls in past yr: 0 0 0 0 0  Injury with Fall? 0 0 0 0 0  Risk for fall due to : - Impaired balance/gait;Impaired mobility - - -  Risk for fall due to: Comment - - - - -  Follow up - Falls prevention discussed - - -      Objective:   Vitals:   11/12/19 0959  BP: 132/70  Pulse: 86  Resp: 16  Temp: 98.3 F (36.8 C)  TempSrc: Temporal  SpO2: 96%  Weight: 242 lb 1.6 oz (109.8 kg)  Height: 6' (1.829 m)    Body mass index is 32.83 kg/m.  Physical Exam    NAD, masked, very pleasant, again wearing his masters had HEENT - [place]/AT, sclera anicteric, positive glasses, PERRL, EOMI, conj - non-inj'ed, pharynx clear Neck - supple, no adenopathy, no TM, carotids 2+ and = without bruits bilat Car - RRR without m/g/r Pulm- RR and effort normal at rest, CTA without wheeze or rales Abd - soft, obese, NT diffusely, ND,  Back - no CVA tenderness Ext - no LE edema,  Neuro/psychiatric - affect was not flat, appropriate with conversation             Alert and oriented             Was able to arise from the chair on his own, and get to the exam table on his own and step up on the exam table with some mild assistance             Speech normal   Results for orders placed or performed in visit on 08/18/19  Bladder Scan (Post Void Residual) in office  Result Value Ref Range   Scan Result 45 mL    Last labs reviewed    Assessment & Plan:    1. Mixed hyperlipidemia We will check his lipid panel again with labs today. He is on a statin presently.  Tolerating to date - COMPLETE METABOLIC PANEL WITH GFR - Lipid panel  2. Essential hypertension Blood pressure was good on check today. Continue medication presently We will check labs today - COMPLETE METABOLIC PANEL WITH GFR  3. Anxiety with depression Denied any increasing anxiety or depression concerns in the recent past. Is taking the BuSpar product  4.  Benign prostatic hyperplasia with nocturia Symptoms noted to be slightly worse on last visit with urology and finasteride restarted.   Continue to follow-up with urology as planned He brought an over-the-counter supplement for his prostate that he asked about, and did not think it was helpful.  Agreed to not continue this supplement, and continue with the medicines per urology's recommendations.  5. Stage 3a chronic kidney disease We will recheck labs again today. - COMPLETE METABOLIC PANEL WITH GFR  6. Erythrocytosis Continue to follow-up with oncology as planned, with this felt to be secondary erythrocytosis from the last assessment. Next follow-up scheduled in November of  this year. - CBC with Differential/Platelet  7. Class 1 obesity due to excess calories with serious comorbidity and body mass index (BMI) of 30.0 to 30.9 in adult Weight has been stable, down a few pounds since last visit, although not marked weight loss. He does note he has not been eating quite as much in the very recent past and is trying to eat healthy.  8. Hx of completed stroke Stable, remains on Plavix  9. Other constipation Has an episodic MiraLAX product to use, and is helpful.  Can continue that. If more problematic over time will follow up.  10. Gastro-esophageal reflux disease without esophagitis Symptoms have remained well controlled off of the Pepcid product. Continuing to try to avoid triggers in his diet as well helpful. - CBC with Differential/Platelet  11. Chronic rhinitis Okay to continue the Flonase product as that has been helpful.  Also has a nonsedating antihistamine that he can use in combination, although if the Flonase product is helpful alone, would just try to use that.  12.  Sleep apnea with nocturnal hypoxemia noted previously Currently is using a CPAP device, and recommended continuing.  He is not sure it is all that helpful, although discussed today that it would be best to  continue, and discussed reasons why.   Unclear of the exact source of the intermittent episodes of feeling "bad "when he first gets up in the morning this past week, with no specific symptoms able to be obtained.  He notes it gets better fairly quickly once up and about. We will check some labs today and await those results, and if symptoms continue or are more problematic, should follow-up again. Tentatively schedule a follow-up in 6 months time at the latest, follow-up sooner as needed.      Jamelle Haring, MD 11/12/19 10:08 AM

## 2019-11-12 ENCOUNTER — Ambulatory Visit: Payer: PPO | Admitting: Internal Medicine

## 2019-11-12 ENCOUNTER — Other Ambulatory Visit: Payer: Self-pay

## 2019-11-12 ENCOUNTER — Ambulatory Visit (INDEPENDENT_AMBULATORY_CARE_PROVIDER_SITE_OTHER): Payer: PPO | Admitting: Internal Medicine

## 2019-11-12 ENCOUNTER — Encounter: Payer: Self-pay | Admitting: Internal Medicine

## 2019-11-12 VITALS — BP 132/70 | HR 86 | Temp 98.3°F | Resp 16 | Ht 72.0 in | Wt 242.1 lb

## 2019-11-12 DIAGNOSIS — K5909 Other constipation: Secondary | ICD-10-CM

## 2019-11-12 DIAGNOSIS — D751 Secondary polycythemia: Secondary | ICD-10-CM

## 2019-11-12 DIAGNOSIS — Z8673 Personal history of transient ischemic attack (TIA), and cerebral infarction without residual deficits: Secondary | ICD-10-CM

## 2019-11-12 DIAGNOSIS — F418 Other specified anxiety disorders: Secondary | ICD-10-CM | POA: Diagnosis not present

## 2019-11-12 DIAGNOSIS — Z6830 Body mass index (BMI) 30.0-30.9, adult: Secondary | ICD-10-CM

## 2019-11-12 DIAGNOSIS — K219 Gastro-esophageal reflux disease without esophagitis: Secondary | ICD-10-CM | POA: Diagnosis not present

## 2019-11-12 DIAGNOSIS — N1831 Chronic kidney disease, stage 3a: Secondary | ICD-10-CM

## 2019-11-12 DIAGNOSIS — J310 Chronic rhinitis: Secondary | ICD-10-CM | POA: Diagnosis not present

## 2019-11-12 DIAGNOSIS — R351 Nocturia: Secondary | ICD-10-CM

## 2019-11-12 DIAGNOSIS — I10 Essential (primary) hypertension: Secondary | ICD-10-CM

## 2019-11-12 DIAGNOSIS — G4733 Obstructive sleep apnea (adult) (pediatric): Secondary | ICD-10-CM

## 2019-11-12 DIAGNOSIS — E782 Mixed hyperlipidemia: Secondary | ICD-10-CM | POA: Diagnosis not present

## 2019-11-12 DIAGNOSIS — E6609 Other obesity due to excess calories: Secondary | ICD-10-CM

## 2019-11-12 DIAGNOSIS — N401 Enlarged prostate with lower urinary tract symptoms: Secondary | ICD-10-CM | POA: Diagnosis not present

## 2019-11-12 LAB — COMPLETE METABOLIC PANEL WITH GFR
AG Ratio: 1.8 (calc) (ref 1.0–2.5)
ALT: 16 U/L (ref 9–46)
AST: 11 U/L (ref 10–35)
Albumin: 4.1 g/dL (ref 3.6–5.1)
Alkaline phosphatase (APISO): 63 U/L (ref 35–144)
BUN/Creatinine Ratio: 9 (calc) (ref 6–22)
BUN: 11 mg/dL (ref 7–25)
CO2: 27 mmol/L (ref 20–32)
Calcium: 9.2 mg/dL (ref 8.6–10.3)
Chloride: 106 mmol/L (ref 98–110)
Creat: 1.19 mg/dL — ABNORMAL HIGH (ref 0.70–1.11)
GFR, Est African American: 63 mL/min/{1.73_m2} (ref >=60)
GFR, Est Non African American: 54 mL/min/{1.73_m2} — ABNORMAL LOW (ref >=60)
Globulin: 2.3 g/dL (calc) (ref 1.9–3.7)
Glucose, Bld: 89 mg/dL (ref 65–99)
Potassium: 4 mmol/L (ref 3.5–5.3)
Sodium: 139 mmol/L (ref 135–146)
Total Bilirubin: 1.6 mg/dL — ABNORMAL HIGH (ref 0.2–1.2)
Total Protein: 6.4 g/dL (ref 6.1–8.1)

## 2019-11-12 LAB — CBC WITH DIFFERENTIAL/PLATELET
Absolute Monocytes: 590 cells/uL (ref 200–950)
Basophils Absolute: 47 cells/uL (ref 0–200)
Basophils Relative: 0.8 %
Eosinophils Absolute: 142 cells/uL (ref 15–500)
Eosinophils Relative: 2.4 %
HCT: 51.2 % — ABNORMAL HIGH (ref 38.5–50.0)
Hemoglobin: 17.5 g/dL — ABNORMAL HIGH (ref 13.2–17.1)
Lymphs Abs: 932 cells/uL (ref 850–3900)
MCH: 31.1 pg (ref 27.0–33.0)
MCHC: 34.2 g/dL (ref 32.0–36.0)
MCV: 90.9 fL (ref 80.0–100.0)
MPV: 10.8 fL (ref 7.5–12.5)
Monocytes Relative: 10 %
Neutro Abs: 4189 cells/uL (ref 1500–7800)
Neutrophils Relative %: 71 %
Platelets: 222 10*3/uL (ref 140–400)
RBC: 5.63 10*6/uL (ref 4.20–5.80)
RDW: 12.6 % (ref 11.0–15.0)
Total Lymphocyte: 15.8 %
WBC: 5.9 10*3/uL (ref 3.8–10.8)

## 2019-11-12 LAB — LIPID PANEL
Cholesterol: 126 mg/dL (ref <200)
HDL: 45 mg/dL (ref >=40)
LDL Cholesterol (Calc): 64 mg/dL (calc)
Non-HDL Cholesterol (Calc): 81 mg/dL (calc) (ref <130)
Total CHOL/HDL Ratio: 2.8 (calc) (ref <5.0)
Triglycerides: 87 mg/dL (ref <150)

## 2019-11-17 ENCOUNTER — Telehealth: Payer: Self-pay | Admitting: Internal Medicine

## 2019-11-17 ENCOUNTER — Other Ambulatory Visit: Payer: Self-pay

## 2019-11-17 MED ORDER — DILTIAZEM HCL ER COATED BEADS 120 MG PO CP24: ORAL_CAPSULE | ORAL | 1 refills | Status: DC

## 2019-11-17 NOTE — Telephone Encounter (Signed)
Returned patient's call and he states he is still having episodes of "feeling bad" at times and wanted to know what the cause could be. Pt advised during appt with Dr. Dorris Fetch last week that he is unclear the reason for these episodes and labs normal. Pt states he does not wear cpap all night and will try to wear it longer to see if that helps. He also denied any symptoms of pain, fever, SOB, etc. Pt aware to schedule appt prior to next follow up if needed.

## 2019-11-17 NOTE — Telephone Encounter (Signed)
Copied from CRM [id number]. Topic: General - Other >> Nov 17, 2019 12:27 PM Jaquita Rector A wrote: Reason for CRM: Patient called to say that he received a message few weeks ago about an AWV he is asking for a call back to speak to the person that conduct these appointments. Please call Ph# [phone]

## 2019-11-19 ENCOUNTER — Ambulatory Visit: Payer: Self-pay | Admitting: Urology

## 2019-11-23 NOTE — Progress Notes (Signed)
11/24/2019  11:18 AM   Jeffery Campbell 06/10/1928 [id number]  Referring provider: Doren Custard, [name] [address] [place],  [place] [address]  Chief Complaint  Patient presents with  . Benign Prostatic Hypertrophy    HPI: Patient is an 86 year old male who returns today for a three month follow up for BPH with LUTS.   BPH WITH LUTS  (prostate and/or bladder) IPSS score: 10/1    PVR: 42 mL mL   Previous score: 8/2   Previous PVR: 45 mL  Major complaint(s):  None. Intermittency and post void dribbling has improved.  Patient denies any modifying or aggravating factors.  Patient denies any gross hematuria, dysuria or suprapubic/flank pain.  Patient denies any fevers, chills, nausea or vomiting.   Currently taking: alfuzosin 10 mg daily and finasteride 5 mg daily   Serum creatinine at baseline      IPSS    Row Name 11/24/19 1100         International Prostate Symptom Score   How often have you had the sensation of not emptying your bladder? Less than 1 in 5     How often have you had to urinate less than every two hours? Less than 1 in 5 times     How often have you found you stopped and started again several times when you urinated? Less than half the time     How often have you found it difficult to postpone urination? Less than half the time     How often have you had a weak urinary stream? Less than half the time     How often have you had to strain to start urination? Less than half the time     How many times did you typically get up at night to urinate? None     Total IPSS Score 10       Quality of Life due to urinary symptoms   If you were to spend the rest of your life with your urinary condition just the way it is now how would you feel about that? Pleased            Score:  1-7 Mild 8-19 Moderate 20-35 Severe   PMH: Past Medical History:  Diagnosis Date  . Arrhythmia   . Cough    CHRONIC  . Depression   . Disturbance of sleep   .  Diverticulitis   . GERD (gastroesophageal reflux disease)   . Hemorrhoids   . Hypertension   . OA (osteoarthritis)   . Pure hypercholesterolemia   . Sleep apnea   . Stroke [facility])     Surgical History: Past Surgical History:  Procedure Laterality Date  . CATARACT EXTRACTION W/PHACO Left 02/14/2016   Procedure: CATARACT EXTRACTION PHACO AND INTRAOCULAR LENS PLACEMENT (IOC);  Surgeon: Galen Manila, MD;  Location: [facility] OR[facility];  Service: Ophthalmology;  Laterality: Left;  [place] 53.7 AP% 21.5 CDE 11.58 FLUID PACK LOT # [id number]  . CATARACT EXTRACTION W/PHACO Right 12/18/2018   Procedure: CATARACT EXTRACTION PHACO AND INTRAOCULAR LENS PLACEMENT (IOC)  RIGHT, VISION BLUE;  Surgeon: Elliot Cousin, MD;  Location: [facility] OR[facility];  Service: Ophthalmology;  Laterality: Right;  [place]  01:14 CDE 11.82 Fluid pack lot # [id number]  . COLONOSCOPY    . KNEE ARTHROSCOPY    . TRANSURETHRAL RESECTION OF PROSTATE      Home Medications:  Allergies as of 11/24/2019   No Known Allergies     Medication List  Accurate as of November 24, 2019 11:18 AM. If you have any questions, ask your nurse or doctor.        acetaminophen 325 MG tablet Commonly known as: TYLENOL Take 325-650 mg by mouth every 6 (six) hours as needed for moderate pain or headache.   alfuzosin 10 MG 24 hr tablet Commonly known as: UROXATRAL TAKE 1 TABLET (10 MG TOTAL) BY MOUTH DAILY WITH BREAKFAST.   atorvastatin 20 MG tablet Commonly known as: LIPITOR TAKE 1 TABLET BY MOUTH EVERYDAY AT BEDTIME   B-12 3000 MCG Subl Place 3,000 mcg under the tongue daily.   busPIRone 5 MG tablet Commonly known as: BUSPAR TAKE 1 TABLET BY MOUTH 2 TIMES DAILY AS NEEDED.   clopidogrel 75 MG tablet Commonly known as: PLAVIX Take 1 tablet (75 mg total) by mouth daily. (stop aggrenox)   diltiazem 120 MG 24 hr capsule Commonly known as: CARDIZEM CD TAKE 1 CAPSULE BY MOUTH EVERY DAY   docusate sodium 100 MG capsule Commonly known as: COLACE Take  100 mg by mouth daily as needed for mild constipation.   finasteride 5 MG tablet Commonly known as: PROSCAR TAKE 1 TABLET BY MOUTH EVERY DAY   fluticasone 50 MCG/ACT nasal spray Commonly known as: FLONASE PLACE 2 SPRAYS INTO BOTH NOSTRILS DAILY AS NEEDED FOR ALLERGIES OR RHINITIS.   levocetirizine 5 MG tablet Commonly known as: XYZAL TAKE 1 TABLET BY MOUTH EVERY DAY IN THE EVENING   meclizine 25 MG tablet Commonly known as: ANTIVERT TAKE 1/2 TO 1 TABLET (12.5-25 MG TOTAL) BY MOUTH 3 (THREE) TIMES DAILY AS NEEDED FOR DIZZINESS.   Systane Balance 0.6 % Soln Generic drug: Propylene Glycol Place 1 drop into both eyes 3 (three) times daily as needed (dry eyes).       Allergies: No Known Allergies  Family History: Family History  Problem Relation Age of Onset  . Hypertension Son        Posey Rea of parents health.  . Prostate cancer Neg Hx   . Bladder Cancer Neg Hx   . Kidney cancer Neg Hx     Social History:  reports that he has never smoked. He has never used smokeless tobacco. He reports that he does not drink alcohol and does not use drugs.  ROS: For pertinent review of systems please refer to history of present illness  Physical Exam: BP (!) 198/79   Pulse 69   Ht 6\' 1"  (1.854 m)   Wt 239 lb (108.4 kg)   BMI 31.53 kg/m   Constitutional:  Well nourished. Alert and oriented, No acute distress. HEENT: [place] AT, mask in place.  Trachea midline Cardiovascular: No clubbing, cyanosis, or edema. Respiratory: Normal respiratory effort, no increased work of breathing. Neurologic: Grossly intact, no focal deficits, moving all 4 extremities. Psychiatric: Normal mood and affect.  Laboratory Data: Lab Results  Component Value Date   WBC 5.9 11/12/2019   HGB 17.5 (H) 11/12/2019   HCT 51.2 (H) 11/12/2019   MCV 90.9 11/12/2019   PLT 222 11/12/2019    Lab Results  Component Value Date   CREATININE 1.19 (H) 11/12/2019    Lab Results  Component Value Date   HGBA1C 5.1  04/09/2018    Lab Results  Component Value Date   TSH 2.090 12/29/2014       Component Value Date/Time   CHOL 126 11/12/2019 1038   CHOL 134 09/22/2014 0810   HDL 45 11/12/2019 1038   HDL 46 09/22/2014 0810   CHOLHDL 2.8  11/12/2019 1038   VLDL 9 10/12/2016 0813   LDLCALC 64 11/12/2019 1038    Lab Results  Component Value Date   AST 11 11/12/2019   Lab Results  Component Value Date   ALT 16 11/12/2019  I have reviewed the labs.   Pertinent Imaging Results for orders placed or performed in visit on 11/24/19  Bladder Scan (Post Void Residual) in office  Result Value Ref Range   Scan Result 42     Assessment & Plan:    1. BPH with LU TS I PSS score 10/1, it is stable Continue conservative management, avoiding bladder irritants and timed voiding's Continue alfuzosin 10 mg and finasteride 5 mg daily Follow-up in 12 months for I PSS and PVR  Return in about 1 year (around 11/23/2020) for IPSS, PVR and exam.  These notes generated with voice recognition software. I apologize for typographical errors.  Michiel Cowboy, PA-C  [facility] [address]  [address] [place], [place] [address] [phone]

## 2019-11-24 ENCOUNTER — Ambulatory Visit (INDEPENDENT_AMBULATORY_CARE_PROVIDER_SITE_OTHER): Payer: PPO | Admitting: Urology

## 2019-11-24 ENCOUNTER — Other Ambulatory Visit: Payer: Self-pay

## 2019-11-24 ENCOUNTER — Encounter: Payer: Self-pay | Admitting: Urology

## 2019-11-24 VITALS — BP 198/79 | HR 69 | Ht 73.0 in | Wt 239.0 lb

## 2019-11-24 DIAGNOSIS — N138 Other obstructive and reflux uropathy: Secondary | ICD-10-CM

## 2019-11-24 DIAGNOSIS — N401 Enlarged prostate with lower urinary tract symptoms: Secondary | ICD-10-CM

## 2019-11-24 LAB — BLADDER SCAN AMB NON-IMAGING: Scan Result: 42

## 2019-11-27 DIAGNOSIS — G4736 Sleep related hypoventilation in conditions classified elsewhere: Secondary | ICD-10-CM | POA: Diagnosis not present

## 2019-11-27 DIAGNOSIS — G4733 Obstructive sleep apnea (adult) (pediatric): Secondary | ICD-10-CM | POA: Diagnosis not present

## 2019-11-27 DIAGNOSIS — D751 Secondary polycythemia: Secondary | ICD-10-CM | POA: Diagnosis not present

## 2019-11-30 ENCOUNTER — Telehealth: Payer: Self-pay

## 2019-11-30 ENCOUNTER — Other Ambulatory Visit: Payer: Self-pay

## 2019-11-30 MED ORDER — CLOPIDOGREL BISULFATE 75 MG PO TABS: 75.0000 mg | ORAL_TABLET | Freq: Every day | ORAL | 1 refills | Status: DC

## 2019-11-30 NOTE — Telephone Encounter (Signed)
appt scheduled with Dr Dorris Fetch for this coming Thursday

## 2019-11-30 NOTE — Telephone Encounter (Signed)
Spoke with Vernard Gambles and she states she was by this morning and described to her that in the morning he feels weak, he feels "bad", not dizzy and he often has to lay back down. She said when she was with him this morning his pulse was lowest at 44 and then went into the 60's range.  Samanadra advised him to drink water before bad and in the morning and to get up slowly and to buy a BP monitor to check his BP in the mornings.   Copied from CRM [id number]. Topic: General - Inquiry >> Nov 30, 2019 11:36 AM Daphine Deutscher D wrote: Reason for CRM: Samandra with [facility] called saying she was out to see patient this morning and is concerned that pt may have arrhthymias or orthostatic hypo heart rate 40-60.  She would like nurse to call her back  [phone]

## 2019-11-30 NOTE — Telephone Encounter (Signed)
Should follow-up to assess.

## 2019-12-01 NOTE — Progress Notes (Signed)
Patient ID: Jeffery BrittleJoe Campbell, male    DOB: Nov 14, 1928, 86 y.o.   MRN: [id number]  PCP: Jamelle HaringHendrickson, Juanette Urizar D, MD  Chief Complaint  Patient presents with  . Follow-up    Subjective:   Jeffery Campbell is a 86 y.o. male, presents to clinic with CC of the following:  Chief Complaint  Patient presents with  . Follow-up    HPI:  Patient is an 86 year old male Last visit with me was 11/12/2019 Communication from the labs obtained after that last visit was as follows:  The CMP was stable with no concerning changes since last check 9 months ago.  The lipid panel remains very good.  The complete blood count showed a slightly higher Hemoglobin and hematocrit from 9 moths ago (not as high as was a year ago), and he should keep his follow-up with oncology later this year as planned.  Was notified by a nurse who did a home assessment on 11/30/2019 of the following:  Spoke with ChadSamandra with [facility] and she states she was by this morning and he described to her that in the morning he feels weak, he feels "bad", not dizzy and he often has to lay back down. She said when she was with him this morning his pulse was lowest at 44 and then went into the 60's range.  Samanadra advised him to drink water before bed and in the morning and to get up slowly and to buy a BP monitor to check his BP in the mornings.  I recommended a follow-up to assess. Follows up today.  On her last visit 11/12/2019, the patient noted the following:  Occasionally wakes up intermittently feeling "Bad", not painful anywhere, no specific sx's and gets up and feels better after 30-60 minutes.  Denies any chest pains, palpitations, shortness of breath, no increased lower extremity swelling, no nausea or vomiting, no abdominal pains, no fevers, no muscle aches, no specific symptoms given other than just feeling "bad "and does not last very long.  Is not every morning, noted about 3 times in the past week.  He notes that  since that nurse came, he recommended he drink water at nighttime and keep water by his bed, and since doing so, he has had no concerning symptoms in the morning when he awakes for the last 3-4 mornings.  He also drinks water when he gets up in the morning. He does take the diltiazem medicine at bedtime.  (Recommended he take that in the morning and not at bedtime). He states he is no pains, including no chest pain, no shortness of breath, no increased lower extremity swelling, no fevers, not feeling ill, and noted today he feels great.  Hypertension:  Medication regimen-diltiazem120 mgdaily Takes medication regularly Not check blood pressures at home, has not taken the blood pressure monitor BP Readings from Last 3 Encounters:  12/03/19 130/76  11/24/19 (!) 198/79  11/12/19 132/70   Denies chest pain,palp's,shortness of breath, increased LE edema, increased headaches, vision changes   Hyperlipidemia: Current Medication Regimen:lipitor 20 mg daily Takes medication regularly Last Lipids: Lab Results  Component Value Date   CHOL 126 11/12/2019   HDL 45 11/12/2019   LDLCALC 64 11/12/2019   TRIG 87 11/12/2019   CHOLHDL 2.8 11/12/2019    -Denies: myalgias.  Anxiety and depression: Denies any increased anxiety or depression sx's PHQ reviewed today Medication regimen-BuSpar 5 mg twice daily, Was on Celexa prior but has stopped that.   BPH/LUTS-seeing urology,  Erythrocytosis- followed by oncology and last visit 02/11/2019 and noted: Erythrocytosis isolated-unclear etiology/howeverSECONDARY-negative Jak 2/normal erythropoietin levels. Today hemoglobin 17; hematocrit48; patient is asymptomatic hold off phlebotomy. NO phelbotomy today Follow up with oncology again in66months/MD/lab- cbc/bmp/possible Phlebtotomy- Dr.B, has appointment scheduled in November  History of prediabetes/Obesity:                   A1c's have been consistently below the prediabetic range for last 2+ years       Lab Results  Component Value Date   HGBA1C 5.1 04/09/2018   HGBA1C 4.9 10/07/2017   HGBA1C 5.1 04/08/2017   Lab Results  Component Value Date   LDLCALC 64 11/12/2019   CREATININE 1.19 (H) 11/12/2019     Wt Readings from Last 3 Encounters:  12/03/19 243 lb 6.4 oz (110.4 kg)  11/24/19 239 lb (108.4 kg)  11/12/19 242 lb 1.6 oz (109.8 kg)   Diet-trying to eat a healthy diet, eats more vegetables,  Exercise-no regular exercise regimen, has been hot outside in the recent past   HX stroke: Stable,taking plavix daily.  CKD - Stage 3 on last check Lab Results  Component Value Date   CREATININE 1.19 (H) 11/12/2019   BUN 11 11/12/2019   NA 139 11/12/2019   K 4.0 11/12/2019   CL 106 11/12/2019   CO2 27 11/12/2019                    Severe OSA with nocturnal hypoxemia - last f/u with pulm was 08/19/2018     Has home CPAP and using at night presently, about 2-3 hours usually.  He is not convinced that he needs to continue this, although stated he will after our discussion previously as I do think it will be helpful.  GERD:      Medication regimen-Pepcid previously, not needed in recent past and denied any heartburn symptoms of concern     Tries to avoid triggers in his diet   OA: states he is doing well with his mobility,has not fallen in the recent past, and is careful getting around. He notes he does have a cane and walker at home as was provided after his stroke. Uses walker when out and about and helpful. No increased pain  Rhinitis-still taking flonase and antihistamine, notes is helpful.  Had cataractsurgery 9/2020and his vision has been okay after that surgery.  Had two doses of Covid vaccine.  Patient Active Problem List   Diagnosis Date Noted  . Obstructive sleep apnea  syndrome 11/12/2019  . Anxiety with depression 07/13/2019  . Benign prostatic hyperplasia with nocturia 07/13/2019  . Recurrent major depressive disorder, in partial remission (HCC) 10/08/2018  . Erythrocytosis 01/31/2018  . Hx of completed stroke 01/10/2018  . Prediabetes 10/07/2017  . Dizziness 07/03/2017  . Abnormal gait 10/05/2016  . Hypoxia, sleep related 04/20/2016  . Elevated hematocrit 04/04/2016  . Right flank pain 01/10/2016  . General unsteadiness 05/24/2015  . Allergic rhinitis due to pollen 11/12/2014  . Back pain, chronic 10/21/2014  . Benign paroxysmal positional nystagmus 10/21/2014  . Decreased libido 10/21/2014  . Fatigue 10/21/2014  . Gastro-esophageal reflux disease without esophagitis 10/21/2014  . Dysmetabolic syndrome 10/21/2014  . Fungal infection of nail 10/21/2014  . HLD (hyperlipidemia) 08/19/2014  . CKD (chronic kidney disease) stage 3, GFR 30-59 ml/min 08/19/2014  . Class 1 obesity due to excess calories with serious comorbidity and body mass index (BMI) of 30.0 to 30.9 in adult   . Asymptomatic  PVCs 05/27/2014  . Essential hypertension 04/05/2014  . Osteoarthrosis involving more than one site but not generalized 08/03/2008      Current Outpatient Medications:  .  acetaminophen (TYLENOL) 325 MG tablet, Take 325-650 mg by mouth every 6 (six) hours as needed for moderate pain or headache., Disp: , Rfl:  .  alfuzosin (UROXATRAL) 10 MG 24 hr tablet, TAKE 1 TABLET (10 MG TOTAL) BY MOUTH DAILY WITH BREAKFAST., Disp: 90 tablet, Rfl: 0 .  atorvastatin (LIPITOR) 20 MG tablet, TAKE 1 TABLET BY MOUTH EVERYDAY AT BEDTIME, Disp: 90 tablet, Rfl: 3 .  busPIRone (BUSPAR) 5 MG tablet, TAKE 1 TABLET BY MOUTH 2 TIMES DAILY AS NEEDED., Disp: 180 tablet, Rfl: 1 .  clopidogrel (PLAVIX) 75 MG tablet, Take 1 tablet (75 mg total) by mouth daily. (stop aggrenox), Disp: 90 tablet, Rfl: 1 .  Cyanocobalamin (B-12) 3000 MCG SUBL, Place 3,000 mcg under the tongue daily. , Disp: 30  tablet, Rfl: 11 .  diltiazem (CARDIZEM CD) 120 MG 24 hr capsule, TAKE 1 CAPSULE BY MOUTH EVERY DAY, Disp: 90 capsule, Rfl: 1 .  finasteride (PROSCAR) 5 MG tablet, TAKE 1 TABLET BY MOUTH EVERY DAY, Disp: 90 tablet, Rfl: 3 .  fluticasone (FLONASE) 50 MCG/ACT nasal spray, PLACE 2 SPRAYS INTO BOTH NOSTRILS DAILY AS NEEDED FOR ALLERGIES OR RHINITIS., Disp: 48 mL, Rfl: 2 .  levocetirizine (XYZAL) 5 MG tablet, TAKE 1 TABLET BY MOUTH EVERY DAY IN THE EVENING, Disp: 90 tablet, Rfl: 3 .  meclizine (ANTIVERT) 25 MG tablet, TAKE 1/2 TO 1 TABLET (12.5-25 MG TOTAL) BY MOUTH 3 (THREE) TIMES DAILY AS NEEDED FOR DIZZINESS., Disp: 30 tablet, Rfl: 2 .  Omega-3 Fatty Acids (FISH OIL) 1200 MG CAPS, Take by mouth., Disp: , Rfl:  .  Propylene Glycol (SYSTANE BALANCE) 0.6 % SOLN, Place 1 drop into both eyes 3 (three) times daily as needed (dry eyes)., Disp: , Rfl:  .  docusate sodium (COLACE) 100 MG capsule, Take 100 mg by mouth daily as needed for mild constipation., Disp: , Rfl:    No Known Allergies   Past Surgical History:  Procedure Laterality Date  . CATARACT EXTRACTION W/PHACO Left 02/14/2016   Procedure: CATARACT EXTRACTION PHACO AND INTRAOCULAR LENS PLACEMENT (IOC);  Surgeon: Galen Manila, MD;  Location: [facility];  Service: Ophthalmology;  Laterality: Left;  [place] 53.7 AP% 21.5 CDE 11.58 FLUID PACK LOT # [id number]  . CATARACT EXTRACTION W/PHACO Right 12/18/2018   Procedure: CATARACT EXTRACTION PHACO AND INTRAOCULAR LENS PLACEMENT (IOC)  RIGHT, VISION BLUE;  Surgeon: Elliot Cousin, MD;  Location: [facility] OR[facility];  Service: Ophthalmology;  Laterality: Right;  [place]  01:14 CDE 11.82 Fluid pack lot # [id number]  . COLONOSCOPY    . KNEE ARTHROSCOPY    . TRANSURETHRAL RESECTION OF PROSTATE       Family History  Problem Relation Age of Onset  . Hypertension Son        Posey Rea of parents health.  . Prostate cancer Neg Hx   . Bladder Cancer Neg Hx   . Kidney cancer Neg Hx      Social History   Tobacco Use  .  Smoking status: Never Smoker  . Smokeless tobacco: Never Used  . Tobacco comment: smoking cessation materials not required  Substance Use Topics  . Alcohol use: No    Alcohol/week: 0.0 standard drinks    With staff assistance, above reviewed with the patient today.  ROS: As per HPI, otherwise no specific complaints on a limited and focused  system review   No results found for this or any previous visit (from the past 72 hour(s)).   PHQ2/9: Depression screen [facility] 2/9 12/03/2019 11/12/2019 10/29/2019 07/13/2019 06/11/2019  Decreased Interest 0 0 0 0 0  Down, Depressed, Hopeless 0 0 0 0 0  PHQ - 2 Score 0 0 0 0 0  Altered sleeping - 0 - 0 0  Tired, decreased energy - 0 - 0 0  Change in appetite - 0 - 0 0  Feeling bad or failure about yourself  - 0 - 0 0  Trouble concentrating - 0 - 0 0  Moving slowly or fidgety/restless - 0 - 0 0  Suicidal thoughts - 0 - 0 0  PHQ-9 Score - 0 - 0 0  Difficult doing work/chores - Not difficult at all - Not difficult at all Not difficult at all  Some recent data might be hidden   PHQ-2/9 Result is   Fall Risk: Fall Risk  12/03/2019 11/12/2019 10/29/2019 07/13/2019 06/11/2019  Falls in the past year? 0 0 0 0 0  Number falls in past yr: 0 0 0 0 0  Injury with Fall? 0 0 0 0 0  Risk for fall due to : - - Impaired balance/gait;Impaired mobility - -  Risk for fall due to: Comment - - - - -  Follow up Falls evaluation completed - Falls prevention discussed - -      Objective:   Vitals:   12/03/19 0759  BP: 130/76  Pulse: 67  Resp: 16  Temp: 98 F (36.7 C)  TempSrc: Oral  SpO2: 97%  Weight: 243 lb 6.4 oz (110.4 kg)  Height: 6\' 1"  (1.854 m)    Body mass index is 32.11 kg/m.  Physical Exam   NAD, masked, pleasant HEENT - [place]/AT, sclera anicteric, PERRL, EOMI, conj - non-inj'ed, pharynx clear Neck - supple, no adenopathy, carotids 2+ and = without bruits bilat Car -borderline bradycardic with heart rate approximately 60 on my exam, occasional  ectopic beat heard, no S3, Pulm- RR and effort normal at rest, CTA without wheeze or rales Abd - soft, NT diffusely, ND,  Back - no CVA tenderness Ext -trace LE edema, not marked Neuro/psychiatric - affect was not flat, appropriate with conversation  Alert   Was able to get up from the chair up 1 step onto the exam table without assistance, uses a cane to help ambulate  Results for orders placed or performed in visit on 11/24/19  Bladder Scan (Post Void Residual) in office  Result Value Ref Range   Scan Result 42    Last labs reviewed ECG reviewed-bradycardic with a PVC and PAC noted, compared to his prior EKG, the PVC and PAC were new.  No concerning acute changes noted Assessment & Plan:   1. Bradycardia 2. Dizzy spells/episodes of feeling "bad "in the morning Discussed concerns that these episodes may be due to his heart going slower and related to his heart rhythm, and do feel having cardiology input presently would be helpful.  Question if a monitor might be helpful, and also any recommendations with respect to his current medication regimen. He has not had these episodes in the past 3-4 mornings, which is encouraging, although still feel further evaluation would be helpful.  He was okay with that. We will continue to stay hydrated at night as he is doing as well as during the day, and did recommend taking the diltiazem in the morning rather than at bedtime. Referral placed to  cardiology today.  3. Essential hypertension Blood pressure was good on check today and has been relatively well controlled on the diltiazem product.  4. Mixed hyperlipidemia Last lipid panel reviewed and was good. Continue the statin product.  5. Anxiety with depression Has remained stable in the recent past. PHQ 2 reviewed today  6. Stage 3a chronic kidney disease Last labs reviewed.  Continuing to monitor  7. Erythrocytosis Persists on last lab check, and continue his follow-up with oncology as  he has been doing.  8. Hx of completed stroke Has been stable.  Has remained on Plavix.  9. Obstructive sleep apnea syndrome Continues with his CPAP.  Await cardiology input presently with referral provided today.  If symptoms return again and are more problematic, the importance of follow-up emphasized as await cardiology input.      Jamelle Haring, MD 12/03/19 8:20 AM

## 2019-12-03 ENCOUNTER — Encounter: Payer: Self-pay | Admitting: Internal Medicine

## 2019-12-03 ENCOUNTER — Ambulatory Visit (INDEPENDENT_AMBULATORY_CARE_PROVIDER_SITE_OTHER): Payer: PPO | Admitting: Internal Medicine

## 2019-12-03 ENCOUNTER — Other Ambulatory Visit: Payer: Self-pay

## 2019-12-03 VITALS — BP 130/76 | HR 67 | Temp 98.0°F | Resp 16 | Ht 73.0 in | Wt 243.4 lb

## 2019-12-03 DIAGNOSIS — R42 Dizziness and giddiness: Secondary | ICD-10-CM | POA: Diagnosis not present

## 2019-12-03 DIAGNOSIS — G4733 Obstructive sleep apnea (adult) (pediatric): Secondary | ICD-10-CM | POA: Diagnosis not present

## 2019-12-03 DIAGNOSIS — E782 Mixed hyperlipidemia: Secondary | ICD-10-CM | POA: Diagnosis not present

## 2019-12-03 DIAGNOSIS — R001 Bradycardia, unspecified: Secondary | ICD-10-CM

## 2019-12-03 DIAGNOSIS — I10 Essential (primary) hypertension: Secondary | ICD-10-CM | POA: Diagnosis not present

## 2019-12-03 DIAGNOSIS — Z8673 Personal history of transient ischemic attack (TIA), and cerebral infarction without residual deficits: Secondary | ICD-10-CM | POA: Diagnosis not present

## 2019-12-03 DIAGNOSIS — N1831 Chronic kidney disease, stage 3a: Secondary | ICD-10-CM

## 2019-12-03 DIAGNOSIS — F418 Other specified anxiety disorders: Secondary | ICD-10-CM | POA: Diagnosis not present

## 2019-12-03 DIAGNOSIS — D751 Secondary polycythemia: Secondary | ICD-10-CM

## 2019-12-03 NOTE — Patient Instructions (Signed)
A referral was placed to cardiology today  Keep drinking the water as you are doing at nighttime and in the morning.  Would take the diltiazem medicine in the morning rather than at night as we discussed

## 2019-12-04 ENCOUNTER — Ambulatory Visit (INDEPENDENT_AMBULATORY_CARE_PROVIDER_SITE_OTHER): Payer: PPO | Admitting: Cardiovascular Disease

## 2019-12-04 ENCOUNTER — Encounter: Payer: Self-pay | Admitting: Cardiovascular Disease

## 2019-12-04 ENCOUNTER — Other Ambulatory Visit: Payer: Self-pay

## 2019-12-04 VITALS — BP 160/76 | HR 60 | Ht 73.0 in | Wt 242.6 lb

## 2019-12-04 DIAGNOSIS — I493 Ventricular premature depolarization: Secondary | ICD-10-CM

## 2019-12-04 DIAGNOSIS — R42 Dizziness and giddiness: Secondary | ICD-10-CM

## 2019-12-04 DIAGNOSIS — R001 Bradycardia, unspecified: Secondary | ICD-10-CM

## 2019-12-04 DIAGNOSIS — I10 Essential (primary) hypertension: Secondary | ICD-10-CM

## 2019-12-04 NOTE — Patient Instructions (Signed)
Medication Instructions:  Your physician recommends that you continue on your current medications as directed. Please refer to the Current Medication list given to you today.  *If you need a refill on your cardiac medications before your next appointment, please call your pharmacy*   Lab Work: None ordered If you have labs (blood work) drawn today and your tests are completely normal, you will receive your results only by: . MyChart Message (if you have MyChart) OR . A paper copy in the mail If you have any lab test that is abnormal or we need to change your treatment, we will call you to review the results.   Testing/Procedures: None ordered   Follow-Up: At [facility], you and your health needs are our priority.  As part of our continuing mission to provide you with exceptional heart care, we have created designated Provider Care Teams.  These Care Teams include your primary Cardiologist (physician) and Advanced Practice Providers (APPs -  Physician Assistants and Nurse Practitioners) who all work together to provide you with the care you need, when you need it.  We recommend signing up for the patient portal called "MyChart".  Sign up information is provided on this After Visit Summary.  MyChart is used to connect with patients for Virtual Visits (Telemedicine).  Patients are able to view lab/test results, encounter notes, upcoming appointments, etc.  Non-urgent messages can be sent to your provider as well.   To learn more about what you can do with MyChart, go to [email or web address].    Your next appointment:   As needed   The format for your next appointment:   In Person  Provider:    You may see  Dr. Arida or one of the following Advanced Practice Providers on your designated Care Team:    Christopher Berge, NP  Ryan Dunn, PA-C  Jacquelyn Visser, PA-C    Other Instructions N/A  

## 2019-12-04 NOTE — Progress Notes (Signed)
Cardiology Office Note   Date:  12/04/2019   ID:  Jeffery Campbell, DOB 26-May-1928, MRN [id number]  PCP:  Jamelle Haring, MD  Cardiologist:   Lorine Bears, MD   Chief Complaint  Patient presents with  . New Patient (Initial Visit)    Establish care, think he may have a blockage. Medications verbally reviewed with patient.      History of Present Illness: Jeffery Campbell is a 86 y.o. male who was referred by Dr. Dorris Fetch for evaluation of bradycardia. He has known history of hypertension, GERD, TIA, hyperlipidemia and obesity. He was seen by me in 2016 for bradycardia and frequent PVCs. He was noted recently to be bradycardic with a heart rate of 51 bpm with frequent PVCs noted on EKG.  Holter monitor at that time showed sinus rhythm with very frequent PVCs. He had a total of 29,000 beats constituting 18%. Echocardiogram showed normal LV systolic function with mildly dilated aortic root and mild mitral regurgitation due to prolapse of the posterior leaflet. He was treated with diltiazem at that time with improvement in PVC burden.  Recently, he had an episode where he felt very weak and sick every time he woke up in the morning.  However, he did not have dizziness or syncope.  He did not really know what exactly was wrong with him but he was told to drink water in the morning and since he started doing that he has not had any of the symptoms.  He denies any chest pain or shortness of breath.  No syncope or presyncope.  He takes diltiazem on a regular basis.  Past Medical History:  Diagnosis Date  . Arrhythmia   . Cough    CHRONIC  . Depression   . Disturbance of sleep   . Diverticulitis   . GERD (gastroesophageal reflux disease)   . Hemorrhoids   . Hypertension   . OA (osteoarthritis)   . Pure hypercholesterolemia   . Sleep apnea   . Stroke [facility])     Past Surgical History:  Procedure Laterality Date  . CATARACT EXTRACTION W/PHACO Left 02/14/2016   Procedure: CATARACT  EXTRACTION PHACO AND INTRAOCULAR LENS PLACEMENT (IOC);  Surgeon: Galen Manila, MD;  Location: [facility] ORS;  Service: Ophthalmology;  Laterality: Left;  [place] 53.7 AP% 21.5 CDE 11.58 FLUID PACK LOT # [id number]  . CATARACT EXTRACTION W/PHACO Right 12/18/2018   Procedure: CATARACT EXTRACTION PHACO AND INTRAOCULAR LENS PLACEMENT (IOC)  RIGHT, VISION BLUE;  Surgeon: Elliot Cousin, MD;  Location: [facility] ORS;  Service: Ophthalmology;  Laterality: Right;  [place]  01:14 CDE 11.82 Fluid pack lot # [id number]  . COLONOSCOPY    . KNEE ARTHROSCOPY    . TRANSURETHRAL RESECTION OF PROSTATE       Current Outpatient Medications  Medication Sig Dispense Refill  . acetaminophen (TYLENOL) 325 MG tablet Take 325-650 mg by mouth every 6 (six) hours as needed for moderate pain or headache.    . alfuzosin (UROXATRAL) 10 MG 24 hr tablet TAKE 1 TABLET (10 MG TOTAL) BY MOUTH DAILY WITH BREAKFAST. 90 tablet 0  . atorvastatin (LIPITOR) 20 MG tablet TAKE 1 TABLET BY MOUTH EVERYDAY AT BEDTIME 90 tablet 3  . busPIRone (BUSPAR) 5 MG tablet TAKE 1 TABLET BY MOUTH 2 TIMES DAILY AS NEEDED. 180 tablet 1  . clopidogrel (PLAVIX) 75 MG tablet Take 1 tablet (75 mg total) by mouth daily. (stop aggrenox) 90 tablet 1  . Cyanocobalamin (B-12) 3000 MCG SUBL Place 3,000 mcg under the  tongue daily.  30 tablet 11  . diltiazem (CARDIZEM CD) 120 MG 24 hr capsule TAKE 1 CAPSULE BY MOUTH EVERY DAY 90 capsule 1  . docusate sodium (COLACE) 100 MG capsule Take 100 mg by mouth daily as needed for mild constipation.    . finasteride (PROSCAR) 5 MG tablet TAKE 1 TABLET BY MOUTH EVERY DAY 90 tablet 3  . fluticasone (FLONASE) 50 MCG/ACT nasal spray PLACE 2 SPRAYS INTO BOTH NOSTRILS DAILY AS NEEDED FOR ALLERGIES OR RHINITIS. 48 mL 2  . levocetirizine (XYZAL) 5 MG tablet TAKE 1 TABLET BY MOUTH EVERY DAY IN THE EVENING 90 tablet 3  . meclizine (ANTIVERT) 25 MG tablet TAKE 1/2 TO 1 TABLET (12.5-25 MG TOTAL) BY MOUTH 3 (THREE) TIMES DAILY AS NEEDED FOR DIZZINESS. 30  tablet 2  . Omega-3 Fatty Acids (FISH OIL) 1200 MG CAPS Take by mouth.    . Propylene Glycol (SYSTANE BALANCE) 0.6 % SOLN Place 1 drop into both eyes 3 (three) times daily as needed (dry eyes).     No current facility-administered medications for this visit.    Allergies:   Patient has no known allergies.    Social History:  The patient  reports that he has never smoked. He has never used smokeless tobacco. He reports that he does not drink alcohol and does not use drugs.   Family History:  The patient's family history includes Hypertension in his son.    ROS:  Please see the history of present illness.   Otherwise, review of systems are positive for none.   All other systems are reviewed and negative.    PHYSICAL EXAM: VS:  BP (!) 160/76 (BP Location: Right Arm, Patient Position: Sitting, Cuff Size: Normal)   Pulse 60   Ht 6\' 1"  (1.854 m)   Wt 242 lb 9.6 oz (110 kg)   SpO2 95%   BMI 32.01 kg/m  , BMI Body mass index is 32.01 kg/m. GEN: Well nourished, well developed, in no acute distress  HEENT: normal  Neck: no JVD, carotid bruits, or masses Cardiac: RRR; no  rubs, or gallops,no edema .  1 out of 6 systolic murmur at the base. Respiratory:  clear to auscultation bilaterally, normal work of breathing GI: soft, nontender, nondistended, + BS MS: no deformity or atrophy  Skin: warm and dry, no rash Neuro:  Strength and sensation are intact Psych: euthymic mood, full affect   EKG:  EKG is ordered today. The ekg ordered today demonstrates normal sinus rhythm with 1 PVC and left axis deviation.   Recent Labs: 11/12/2019: ALT 16; BUN 11; Creat 1.19; Hemoglobin 17.5; Platelets 222; Potassium 4.0; Sodium 139    Lipid Panel    Component Value Date/Time   CHOL 126 11/12/2019 1038   CHOL 134 09/22/2014 0810   TRIG 87 11/12/2019 1038   HDL 45 11/12/2019 1038   HDL 46 09/22/2014 0810   CHOLHDL 2.8 11/12/2019 1038   VLDL 9 10/12/2016 0813   LDLCALC 64 11/12/2019 1038       Wt Readings from Last 3 Encounters:  12/04/19 242 lb 9.6 oz (110 kg)  12/03/19 243 lb 6.4 oz (110.4 kg)  11/24/19 239 lb (108.4 kg)       No flowsheet data found.    ASSESSMENT AND PLAN:  1.  History of frequent PVCs: These seem to be improved since he has been on diltiazem and currently is asymptomatic.  2.  History of bradycardia: He is not bradycardic today and was not  bradycardic during his last visit.  Some of his previous bradycardia events were in the setting of frequent PVCs.  Currently with no symptoms related to this.  He denies syncope or presyncope.  Also he reports complete resolution of the symptoms that he was having in the morning and thus I do not see much utility of repeating the monitor for now unless he becomes symptomatic again.  3.  Essential hypertension: Blood pressure is mildly elevated but usually is more controlled.  I made no changes today.  3.  History of TIA: On Plavix.     Disposition:   The patient can follow-up with me as needed if he becomes symptomatic again.  Signed,  Lorine Bears, MD  12/04/2019 9:10 AM    [facility]

## 2019-12-16 ENCOUNTER — Other Ambulatory Visit: Payer: Self-pay | Admitting: Family Medicine

## 2019-12-28 DIAGNOSIS — G4736 Sleep related hypoventilation in conditions classified elsewhere: Secondary | ICD-10-CM | POA: Diagnosis not present

## 2019-12-28 DIAGNOSIS — G4733 Obstructive sleep apnea (adult) (pediatric): Secondary | ICD-10-CM | POA: Diagnosis not present

## 2019-12-28 DIAGNOSIS — D751 Secondary polycythemia: Secondary | ICD-10-CM | POA: Diagnosis not present

## 2019-12-31 ENCOUNTER — Telehealth: Payer: Self-pay

## 2019-12-31 NOTE — Telephone Encounter (Signed)
Please schedule a follow-up visit for this. Can be phone if desires.  Thanks,

## 2019-12-31 NOTE — Telephone Encounter (Signed)
Copied from CRM [id number]. Topic: General - Other >> Dec 31, 2019  1:18 PM Jaquita Rector A wrote: Reason for CRM: Patient called to request Dr Dorris Fetch to please prescribe him something that can help with him using the bathroom say that sometimes the urine come down too fast sometimes. Asking for a call back at Ph# [phone]

## 2020-01-01 NOTE — Progress Notes (Signed)
Patient ID: Jeffery Campbell, male    DOB: 18-Aug-1928, 86 y.o.   MRN: [id number]  PCP: Jamelle Haring, MD  Chief Complaint  Patient presents with  . Bladder Concerns    Subjective:   Jeffery Campbell is a 86 y.o. male, presents to clinic with CC of the following:  Chief Complaint  Patient presents with  . Bladder Concerns    HPI:  Patient is an 86 year old male Last visit with me was 12/03/2019 Follows up today with bladder concerns.  He did follow-up with cardiology after our visit with the following assessment/plan:  ASSESSMENT AND PLAN:  1.  History of frequent PVCs: These seem to be improved since he has been on diltiazem and currently is asymptomatic. 2.  History of bradycardia: He is not bradycardic today and was not bradycardic during his last visit.  Some of his previous bradycardia events were in the setting of frequent PVCs.  Currently with no symptoms related to this.  He denies syncope or presyncope.  Also he reports complete resolution of the symptoms that he was having in the morning and thus I do not see much utility of repeating the monitor for now unless he becomes symptomatic again. 3.  Essential hypertension: Blood pressure is mildly elevated but usually is more controlled.  I made no changes today. 3.  History of TIA: On Plavix.   He last saw urology 11/24/2019 for his 22-month follow-up for BPH with LUTS and the following noted  BPH WITH LUTS  (prostate and/or bladder) IPSS score: 10/1    PVR: 42 mL mL   Previous score: 8/2   Previous PVR: 45 mL Major complaint(s):  None. Intermittency and post void dribbling has improved.  Patient denies any modifying or aggravating factors.  Patient denies any gross hematuria, dysuria or suprapubic/flank pain.  Patient denies any fevers, chills, nausea or vomiting.  Currently taking: alfuzosin 10 mg daily and finasteride 5 mg daily  Serum creatinine at baseline   1. BPH with LUTS I PSS score 10/1, it is  stable Continue conservative management, avoiding bladder irritants and timed voiding's Continue alfuzosin 10 mg and finasteride 5 mg daily Follow-up in 12 months for I PSS and PVR  Return in about 1 year (around 11/23/2020) for IPSS, PVR and exam.  He presents today as he notes he still struggles at times with some dribbling.  He noted in the past he had a medicine which stopped him from urinating and that was bad, and notes he has seen urology who has prescribed medicines which have been helpful, although at times he still has some of the urgency and dribbling.  I did review the last urology note.  He is having no pain with urination.  No blood.  He had noted previously feeling bad in the morning when he first wakes up, and a nurse noted his heart rate was low, and I had seen him after.  He was referred to cardiology, with their recommendations as noted above after their evaluation.  It was not bradycardic on that visit with cardiology.  He states he had another episode last week feeling bad when he first woke up, although was better after about 15 minutes, and has not happened since.  He denied any chest pains, palpitations, shortness of breath, increased leg swelling, or other symptoms of concern in association.  The nurse recommended he drink fluids before he goes to bed, and he thinks that may be helping.  Otherwise, he notes he  has been feeling well.  Patient Active Problem List   Diagnosis Date Noted  . Obstructive sleep apnea syndrome 11/12/2019  . Anxiety with depression 07/13/2019  . Benign prostatic hyperplasia with nocturia 07/13/2019  . Recurrent major depressive disorder, in partial remission (HCC) 10/08/2018  . Erythrocytosis 01/31/2018  . Hx of completed stroke 01/10/2018  . Prediabetes 10/07/2017  . Dizziness 07/03/2017  . Abnormal gait 10/05/2016  . Hypoxia, sleep related 04/20/2016  . Elevated hematocrit 04/04/2016  . Right flank pain 01/10/2016  . General unsteadiness  05/24/2015  . Allergic rhinitis due to pollen 11/12/2014  . Back pain, chronic 10/21/2014  . Benign paroxysmal positional nystagmus 10/21/2014  . Decreased libido 10/21/2014  . Fatigue 10/21/2014  . Gastro-esophageal reflux disease without esophagitis 10/21/2014  . Dysmetabolic syndrome 10/21/2014  . Fungal infection of nail 10/21/2014  . HLD (hyperlipidemia) 08/19/2014  . CKD (chronic kidney disease) stage 3, GFR 30-59 ml/min (HCC) 08/19/2014  . Class 1 obesity due to excess calories with serious comorbidity and body mass index (BMI) of 30.0 to 30.9 in adult   . Asymptomatic PVCs 05/27/2014  . Bradycardia 04/05/2014  . Essential hypertension 04/05/2014  . Osteoarthrosis involving more than one site but not generalized 08/03/2008      Current Outpatient Medications:  .  acetaminophen (TYLENOL) 325 MG tablet, Take 325-650 mg by mouth every 6 (six) hours as needed for moderate pain or headache., Disp: , Rfl:  .  alfuzosin (UROXATRAL) 10 MG 24 hr tablet, TAKE 1 TABLET (10 MG TOTAL) BY MOUTH DAILY WITH BREAKFAST., Disp: 90 tablet, Rfl: 0 .  atorvastatin (LIPITOR) 20 MG tablet, TAKE 1 TABLET BY MOUTH EVERYDAY AT BEDTIME, Disp: 90 tablet, Rfl: 3 .  busPIRone (BUSPAR) 5 MG tablet, TAKE 1 TABLET BY MOUTH 2 TIMES DAILY AS NEEDED., Disp: 180 tablet, Rfl: 1 .  clopidogrel (PLAVIX) 75 MG tablet, Take 1 tablet (75 mg total) by mouth daily. (stop aggrenox), Disp: 90 tablet, Rfl: 1 .  Cyanocobalamin (B-12) 3000 MCG SUBL, Place 3,000 mcg under the tongue daily. , Disp: 30 tablet, Rfl: 11 .  diltiazem (CARDIZEM CD) 120 MG 24 hr capsule, TAKE 1 CAPSULE BY MOUTH EVERY DAY, Disp: 90 capsule, Rfl: 1 .  docusate sodium (COLACE) 100 MG capsule, Take 100 mg by mouth daily as needed for mild constipation., Disp: , Rfl:  .  finasteride (PROSCAR) 5 MG tablet, TAKE 1 TABLET BY MOUTH EVERY DAY, Disp: 90 tablet, Rfl: 3 .  fluticasone (FLONASE) 50 MCG/ACT nasal spray, PLACE 2 SPRAYS INTO BOTH NOSTRILS DAILY AS NEEDED  FOR ALLERGIES OR RHINITIS., Disp: 48 mL, Rfl: 2 .  levocetirizine (XYZAL) 5 MG tablet, TAKE 1 TABLET BY MOUTH EVERY DAY IN THE EVENING, Disp: 90 tablet, Rfl: 3 .  meclizine (ANTIVERT) 25 MG tablet, TAKE 1/2 TO 1 TABLET (12.5-25 MG TOTAL) BY MOUTH UP TO TWICE DAILY AS NEEDED FOR DIZZINESS, USE SPARINGLY, Disp: 30 tablet, Rfl: 1 .  Omega-3 Fatty Acids (FISH OIL) 1200 MG CAPS, Take by mouth., Disp: , Rfl:  .  Propylene Glycol (SYSTANE BALANCE) 0.6 % SOLN, Place 1 drop into both eyes 3 (three) times daily as needed (dry eyes)., Disp: , Rfl:    No Known Allergies   Past Surgical History:  Procedure Laterality Date  . CATARACT EXTRACTION W/PHACO Left 02/14/2016   Procedure: CATARACT EXTRACTION PHACO AND INTRAOCULAR LENS PLACEMENT (IOC);  Surgeon: Galen Manila, MD;  Location: [facility];  Service: Ophthalmology;  Laterality: Left;  [place] 53.7 AP% 21.5 CDE 11.58  FLUID PACK LOT # [id number]  . CATARACT EXTRACTION W/PHACO Right 12/18/2018   Procedure: CATARACT EXTRACTION PHACO AND INTRAOCULAR LENS PLACEMENT (IOC)  RIGHT, VISION BLUE;  Surgeon: Elliot Cousin, MD;  Location: [facility];  Service: Ophthalmology;  Laterality: Right;  [place]  01:14 CDE 11.82 Fluid pack lot # [id number]  . COLONOSCOPY    . KNEE ARTHROSCOPY    . TRANSURETHRAL RESECTION OF PROSTATE       Family History  Problem Relation Age of Onset  . Hypertension Son        Posey Rea of parents health.  . Prostate cancer Neg Hx   . Bladder Cancer Neg Hx   . Kidney cancer Neg Hx      Social History   Tobacco Use  . Smoking status: Never Smoker  . Smokeless tobacco: Never Used  . Tobacco comment: smoking cessation materials not required  Substance Use Topics  . Alcohol use: No    Alcohol/week: 0.0 standard drinks    With staff assistance, above reviewed with the patient today.  ROS: As per HPI, otherwise no specific complaints on a limited and focused system review   No results found for this or any previous visit (from the past  72 hour(s)).   PHQ2/9: Depression screen [facility] 2/9 01/05/2020 12/03/2019 11/12/2019 10/29/2019 07/13/2019  Decreased Interest 0 0 0 0 0  Down, Depressed, Hopeless 0 0 0 0 0  PHQ - 2 Score 0 0 0 0 0  Altered sleeping - - 0 - 0  Tired, decreased energy - - 0 - 0  Change in appetite - - 0 - 0  Feeling bad or failure about yourself  - - 0 - 0  Trouble concentrating - - 0 - 0  Moving slowly or fidgety/restless - - 0 - 0  Suicidal thoughts - - 0 - 0  PHQ-9 Score - - 0 - 0  Difficult doing work/chores - - Not difficult at all - Not difficult at all  Some recent data might be hidden   PHQ-2/9 Result is neg  Fall Risk: Fall Risk  01/05/2020 12/03/2019 11/12/2019 10/29/2019 07/13/2019  Falls in the past year? 0 0 0 0 0  Number falls in past yr: 0 0 0 0 0  Injury with Fall? 0 0 0 0 0  Risk for fall due to : - - - Impaired balance/gait;Impaired mobility -  Risk for fall due to: Comment - - - - -  Follow up - Falls evaluation completed - Falls prevention discussed -      Objective:   Vitals:   01/05/20 1311  BP: 134/60  Pulse: 74  Resp: 16  Temp: 98.5 F (36.9 C)  TempSrc: Oral  SpO2: 96%  Weight: 243 lb 1.6 oz (110.3 kg)  Height: 6\' 1"  (1.854 m)    Body mass index is 32.07 kg/m.  Physical Exam   NAD, masked, pleasant HEENT - [place]/AT, sclera anicteric, PERRL, EOMI, conj - non-inj'ed, TM's and canals clear, pharynx clear Neck - supple, no adenopathy, carotids 2+ and = without bruits bilat Car - RRR without m/g/r, rare ectopic beat heard Pulm- RR and effort normal at rest, CTA without wheeze or rales Abd - soft, NT diffusely, ND,  Back - no CVA tenderness Skin- no rash noted, no new lesions of concern noted on exposed areas, patient denied otherwise Ext -trace LE edema, not marked Neuro/psychiatric - affect was not flat, appropriate with conversation  Alert with normal speech   was able to get  up from the chair and up onto the exam table with 1 step without assistance, uses a cane to  help ambulate     Results for orders placed or performed in visit on 11/24/19  Bladder Scan (Post Void Residual) in office  Result Value Ref Range   Scan Result 42    Last labs reviewed (from August 2021)    Assessment & Plan:   1. Benign localized prostatic hyperplasia with lower urinary tract symptoms (LUTS) Patient currently sees urology, and treated with both finasteride and alfuzosin presently.  He notes they have been helpful with his symptoms.  Still not completely resolved. Hesitant to add another medicine or increase doses of those medications and discussed the reasons behind that.  He was in agreement presently. We will continue with those medications and follow-up with urology, and if symptoms are increasing, can follow-up with urology sooner than planned.  2. Frequent PVCs hx/spells Episodes of feeling bad in the morning have significantly lessened over time, although had another episode last week.  Did ask cardiology to evaluate, especially after noting he had some bradycardia concerns in association at that time, and also has a frequent history of PVCs. He denies any concerning cardiac symptoms in association. Has improved with staying better hydrated We will continue to monitor presently, although emphasized if this is recurrent, the need to follow-up as may need to get a monitor placed to help assess, although he has noted it has improved and all in all, is feeling good throughout the day.  3. Essential hypertension Blood pressure remains well controlled presently.   We will hold off on checking further labs again today, although may need to repeat pending his clinical status.  He was in agreement with this. He is aware if having more of those episodes, the importance of following up and await his response over time.  Also continuing to stay well-hydrated was emphasized today.     Jamelle HaringLIFFORD D Kanija Remmel, MD 01/05/20 1:15 PM

## 2020-01-05 ENCOUNTER — Encounter: Payer: Self-pay | Admitting: Internal Medicine

## 2020-01-05 ENCOUNTER — Ambulatory Visit (INDEPENDENT_AMBULATORY_CARE_PROVIDER_SITE_OTHER): Payer: PPO | Admitting: Internal Medicine

## 2020-01-05 ENCOUNTER — Other Ambulatory Visit: Payer: Self-pay

## 2020-01-05 VITALS — BP 134/60 | HR 74 | Temp 98.5°F | Resp 16 | Ht 73.0 in | Wt 243.1 lb

## 2020-01-05 DIAGNOSIS — I493 Ventricular premature depolarization: Secondary | ICD-10-CM | POA: Diagnosis not present

## 2020-01-05 DIAGNOSIS — I10 Essential (primary) hypertension: Secondary | ICD-10-CM | POA: Diagnosis not present

## 2020-01-05 DIAGNOSIS — N401 Enlarged prostate with lower urinary tract symptoms: Secondary | ICD-10-CM | POA: Diagnosis not present

## 2020-01-05 NOTE — Patient Instructions (Signed)
Stay well hydrated.

## 2020-01-13 DIAGNOSIS — H1033 Unspecified acute conjunctivitis, bilateral: Secondary | ICD-10-CM | POA: Diagnosis not present

## 2020-01-16 ENCOUNTER — Other Ambulatory Visit: Payer: Self-pay | Admitting: Internal Medicine

## 2020-01-18 ENCOUNTER — Telehealth: Payer: Self-pay | Admitting: Internal Medicine

## 2020-01-18 DIAGNOSIS — R42 Dizziness and giddiness: Secondary | ICD-10-CM

## 2020-01-18 NOTE — Telephone Encounter (Signed)
Pt called in to request a RX for medication listed below . meds was recently  refused by [facility]. Please advise

## 2020-01-19 ENCOUNTER — Other Ambulatory Visit: Payer: Self-pay | Admitting: Urology

## 2020-01-19 ENCOUNTER — Other Ambulatory Visit: Payer: Self-pay | Admitting: Internal Medicine

## 2020-01-19 MED ORDER — MECLIZINE HCL 25 MG PO TABS: ORAL_TABLET | ORAL | 1 refills | Status: DC

## 2020-01-19 NOTE — Telephone Encounter (Signed)
Please help renew this script for him  Thanks, [facility]

## 2020-01-19 NOTE — Telephone Encounter (Signed)
Pt called about refill for meclizine (ANTIVERT) 25 MG tablet /I called pharmacy and they stated that Pt picked up refill on 10.2.21/ Pt stated he misplaced it and would like to have on hand / Please advise

## 2020-01-26 DIAGNOSIS — H10502 Unspecified blepharoconjunctivitis, left eye: Secondary | ICD-10-CM | POA: Diagnosis not present

## 2020-01-27 DIAGNOSIS — G4733 Obstructive sleep apnea (adult) (pediatric): Secondary | ICD-10-CM | POA: Diagnosis not present

## 2020-01-27 DIAGNOSIS — D751 Secondary polycythemia: Secondary | ICD-10-CM | POA: Diagnosis not present

## 2020-01-27 DIAGNOSIS — G4736 Sleep related hypoventilation in conditions classified elsewhere: Secondary | ICD-10-CM | POA: Diagnosis not present

## 2020-01-29 DIAGNOSIS — R531 Weakness: Secondary | ICD-10-CM | POA: Diagnosis not present

## 2020-02-02 DIAGNOSIS — F419 Anxiety disorder, unspecified: Secondary | ICD-10-CM | POA: Diagnosis not present

## 2020-02-02 NOTE — Progress Notes (Signed)
Patient ID: Jeffery Campbell, male    DOB: [birth year], 86 y.o.   MRN: [id number]  PCP: Jamelle Haring, MD  Chief Complaint  Patient presents with  . Anxiety    Subjective:   Jeffery Campbell is a 86 y.o. male, presents to clinic with CC of the following:  Chief Complaint  Patient presents with  . Anxiety    HPI:  Patient is a 86 year old male Last visit was 01/05/2020 Follows up today with anxiety questions.  On his most recent visit with cardiology, it was noted his frequent PVCs improved with diltiazem, and presently was asymptomatic. He was seen previously for feeling bad in the morning for a brief interval of time, and was noted to be bradycardic at one point.  Cardiology did not recommend pursuing a monitor on that recent visit as well. Also on that cardiology visit, his blood pressure was mildly elevated, although no changes made in his regimen at that time.  He had noted previously feeling bad in the morning when he first wakes up, and a nurse noted his heart rate was low, and I had seen him after.  He was referred to cardiology, with their recommendations not to pursue a monitor presently.  He was not bradycardic on that visit with cardiology.   He states he had another episode a week prior to our last visit feeling bad when he first woke up, although was better after about 15 minutes, and had not happened since.  He denied any chest pains, palpitations, shortness of breath, increased leg swelling, or other symptoms of concern in association.  The nurse had previously recommended he drink fluids before he goes to bed, and he thinks that may be helping.  He again noted this to a nurse that checks on him, as he noted he felt bad again 1 morning over a week ago, and the nurse mentioned potential anxiety as a source.  He noted again no other concerning symptoms when he was feeling bad with no chest pains, palpitations, shortness of breath, one-sided symptoms, and again when got  up and about was feeling much better after about 15 minutes.  Denied any pain, no recent falls, and noted he feels well today.  He had been prescribed Celexa previously for anxiety with depression issues, stopped and was changed to BuSpar 5 mg twice daily, and noted that had been helpful on a recent prior visit.  He continues to take the BuSpar 5 mg twice daily.  He denied marked anxiety concerns on our discussion today, and in fact asked "what is anxiety ".  Denied feeling overly nervous, panicked, and denied any difficulty sleeping recently.  His PHQ-9 and GAD-7 scores were reviewed. He does admit to some anxiety about is this going to happen again, and also about what could be causing this when he does feel that way.  He noted he does take a nonsedating antihistamine, Xyzal for allergy symptoms as needed, and he does take it at bedtime when he takes it.  I did note this can make one drowsy or more groggy in the morning at times, and he noted he would stopped taking it in the future after our discussion. Discussed anxiety and the medication he is currently taking to help, and he wanted to try increasing the dosage when I discussed alternatives, and see if that would be helpful.  I did not feel that was unreasonable.    Patient Active Problem List   Diagnosis Date Noted  .  Obstructive sleep apnea syndrome 11/12/2019  . Anxiety with depression 07/13/2019  . Benign localized prostatic hyperplasia with lower urinary tract symptoms (LUTS) 07/13/2019  . Recurrent major depressive disorder, in partial remission (HCC) 10/08/2018  . Erythrocytosis 01/31/2018  . Hx of completed stroke 01/10/2018  . Prediabetes 10/07/2017  . Dizziness 07/03/2017  . Abnormal gait 10/05/2016  . Hypoxia, sleep related 04/20/2016  . Elevated hematocrit 04/04/2016  . Right flank pain 01/10/2016  . General unsteadiness 05/24/2015  . Allergic rhinitis due to pollen 11/12/2014  . Back pain, chronic 10/21/2014  . Benign  paroxysmal positional nystagmus 10/21/2014  . Decreased libido 10/21/2014  . Fatigue 10/21/2014  . Gastro-esophageal reflux disease without esophagitis 10/21/2014  . Dysmetabolic syndrome 10/21/2014  . Fungal infection of nail 10/21/2014  . HLD (hyperlipidemia) 08/19/2014  . CKD (chronic kidney disease) stage 3, GFR 30-59 ml/min (HCC) 08/19/2014  . Class 1 obesity due to excess calories with serious comorbidity and body mass index (BMI) of 30.0 to 30.9 in adult   . Asymptomatic PVCs 05/27/2014  . Bradycardia 04/05/2014  . Essential hypertension 04/05/2014  . Osteoarthrosis involving more than one site but not generalized 08/03/2008      Current Outpatient Medications:  .  acetaminophen (TYLENOL) 325 MG tablet, Take 325-650 mg by mouth every 6 (six) hours as needed for moderate pain or headache., Disp: , Rfl:  .  alfuzosin (UROXATRAL) 10 MG 24 hr tablet, TAKE 1 TABLET (10 MG TOTAL) BY MOUTH DAILY WITH BREAKFAST., Disp: 90 tablet, Rfl: 0 .  atorvastatin (LIPITOR) 20 MG tablet, TAKE 1 TABLET BY MOUTH EVERYDAY AT BEDTIME, Disp: 90 tablet, Rfl: 3 .  busPIRone (BUSPAR) 5 MG tablet, TAKE 1 TABLET BY MOUTH 2 TIMES DAILY AS NEEDED., Disp: 180 tablet, Rfl: 1 .  clopidogrel (PLAVIX) 75 MG tablet, Take 1 tablet (75 mg total) by mouth daily. (stop aggrenox), Disp: 90 tablet, Rfl: 1 .  Cyanocobalamin (B-12) 3000 MCG SUBL, Place 3,000 mcg under the tongue daily. , Disp: 30 tablet, Rfl: 11 .  diltiazem (CARDIZEM CD) 120 MG 24 hr capsule, TAKE 1 CAPSULE BY MOUTH EVERY DAY, Disp: 90 capsule, Rfl: 1 .  docusate sodium (COLACE) 100 MG capsule, Take 100 mg by mouth daily as needed for mild constipation., Disp: , Rfl:  .  finasteride (PROSCAR) 5 MG tablet, TAKE 1 TABLET BY MOUTH EVERY DAY, Disp: 90 tablet, Rfl: 3 .  fluticasone (FLONASE) 50 MCG/ACT nasal spray, PLACE 2 SPRAYS INTO BOTH NOSTRILS DAILY AS NEEDED FOR ALLERGIES OR RHINITIS., Disp: 48 mL, Rfl: 2 .  FLUZONE HIGH-DOSE QUADRIVALENT 0.7 ML SUSY, ,  Disp: , Rfl:  .  levocetirizine (XYZAL) 5 MG tablet, TAKE 1 TABLET BY MOUTH EVERY DAY IN THE EVENING, Disp: 90 tablet, Rfl: 3 .  MAXITROL 3.5-10000-0.1 ophthalmic suspension, Apply 1 drop to eye 3 (three) times daily., Disp: , Rfl:  .  meclizine (ANTIVERT) 25 MG tablet, TAKE 1/2 TO 1 TABLET (12.5-25 MG TOTAL) BY MOUTH UP TO TWICE DAILY AS NEEDED FOR DIZZINESS, USE SPARINGLY, Disp: 30 tablet, Rfl: 1 .  Omega-3 Fatty Acids (FISH OIL) 1200 MG CAPS, Take by mouth., Disp: , Rfl:  .  Propylene Glycol (SYSTANE BALANCE) 0.6 % SOLN, Place 1 drop into both eyes 3 (three) times daily as needed (dry eyes)., Disp: , Rfl:    No Known Allergies   Past Surgical History:  Procedure Laterality Date  . CATARACT EXTRACTION W/PHACO Left 02/14/2016   Procedure: CATARACT EXTRACTION PHACO AND INTRAOCULAR LENS PLACEMENT (IOC);  Surgeon: Galen Manila, MD;  Location: [facility] ORS;  Service: Ophthalmology;  Laterality: Left;  [place] 53.7 AP% 21.5 CDE 11.58 FLUID PACK LOT # [id number]  . CATARACT EXTRACTION W/PHACO Right 12/18/2018   Procedure: CATARACT EXTRACTION PHACO AND INTRAOCULAR LENS PLACEMENT (IOC)  RIGHT, VISION BLUE;  Surgeon: Elliot Cousin, MD;  Location: [facility] ORS;  Service: Ophthalmology;  Laterality: Right;  [place]  01:14 CDE 11.82 Fluid pack lot # [id number]  . COLONOSCOPY    . KNEE ARTHROSCOPY    . TRANSURETHRAL RESECTION OF PROSTATE       Family History  Problem Relation Age of Onset  . Hypertension Son        Posey Rea of parents health.  . Prostate cancer Neg Hx   . Bladder Cancer Neg Hx   . Kidney cancer Neg Hx      Social History   Tobacco Use  . Smoking status: Never Smoker  . Smokeless tobacco: Never Used  . Tobacco comment: smoking cessation materials not required  Substance Use Topics  . Alcohol use: No    Alcohol/week: 0.0 standard drinks    With staff assistance, above reviewed with the patient today.  ROS: As per HPI, otherwise no specific complaints on a limited and focused system  review   No results found for this or any previous visit (from the past 72 hour(s)).   PHQ2/9: Depression screen [facility] 2/9 02/03/2020 01/05/2020 12/03/2019 11/12/2019 10/29/2019  Decreased Interest 0 0 0 0 0  Down, Depressed, Hopeless 0 0 0 0 0  PHQ - 2 Score 0 0 0 0 0  Altered sleeping 0 - - 0 -  Tired, decreased energy 0 - - 0 -  Change in appetite 0 - - 0 -  Feeling bad or failure about yourself  0 - - 0 -  Trouble concentrating 0 - - 0 -  Moving slowly or fidgety/restless 0 - - 0 -  Suicidal thoughts 0 - - 0 -  PHQ-9 Score 0 - - 0 -  Difficult doing work/chores - - - Not difficult at all -  Some recent data might be hidden   PHQ-2/9 Result is neg  GAD 7 : Generalized Anxiety Score 02/03/2020 03/13/2019 07/21/2018 05/26/2018  Nervous, Anxious, on Edge 0 1 0 0  Control/stop worrying 0 1 0 0  Worry too much - different things 0 1 0 0  Trouble relaxing 0 1 0 0  Restless 0 1 0 0  Easily annoyed or irritable 0 1 0 0  Afraid - awful might happen 0 1 0 0  Total GAD 7 Score 0 7 0 0  Anxiety Difficulty - Somewhat difficult Not difficult at all Not difficult at all    GAD7 reviewed  Fall Risk: Fall Risk  02/03/2020 01/05/2020 12/03/2019 11/12/2019 10/29/2019  Falls in the past year? 0 0 0 0 0  Number falls in past yr: 0 0 0 0 0  Injury with Fall? 0 0 0 0 0  Risk for fall due to : - - - - Impaired balance/gait;Impaired mobility  Risk for fall due to: Comment - - - - -  Follow up - - Falls evaluation completed - Falls prevention discussed      Objective:   Vitals:   02/03/20 1055  BP: 138/68  Pulse: 75  Resp: 16  Temp: 97.6 F (36.4 C)  TempSrc: Oral  SpO2: 95%  Weight: 238 lb 9.6 oz (108.2 kg)  Height: 6\' 1"  (1.854 m)    Body  mass index is 31.48 kg/m.  Physical Exam   NAD, masked, very pleasant,  HEENT - [place]/AT, sclera anicteric, positive glasses, PERRL, EOMI, conj - non-inj'ed, pharynx clear Neck - supple, no adenopathy, no TM, carotids 2+ and = without bruits bilat Car -  RRR without m/g/r, not bradycardic Pulm- RR and effort normal at rest, CTA without wheeze or rales Abd - soft,obese,NTdiffusely, ND,  Back - no CVA tenderness Ext - no LE edema,  Neuro/psychiatric - affect was not flat, appropriate with conversation Alert and oriented  Grossly nonfocal Was able to arise from the chair on his own, and get to the exam table and step up on the exam table with some mild assistance, uses a wheeled walker for support when ambulating Speech normal   Results for orders placed or performed in visit on 11/24/19  Bladder Scan (Post Void Residual) in office  Result Value Ref Range   Scan Result 42        Assessment & Plan:   1. Anxiety Patient noted a history of anxiety with depression in the past, and may be a little more anxious in the recent past after our discussion.  He very much wanted to try a higher dose of the BuSpar product, and I did not think that was unreasonable.   Can increase that to 10 mg tablets and take up to twice a day about 12 hours apart when taking. Await his response.  - busPIRone (BUSPAR) 10 MG tablet; TAKE 1 TABLET BY MOUTH 2 TIMES DAILY AS NEEDED.  Dispense: 180 tablet; Refill: 1  2.  Episodic spells of feeling bad in the morning -exact source unclear, with the nurse 1 time noting he was bradycardic, and did ask for cardiology to see him at that point, with a heart monitor not felt needed with their input.  He denied any more concerning associated symptoms when this occurs, and has been happening less frequently in the recent past.  Has been trying to stay better hydrated.  Question if taking a nonsedating antihistamine in the evening for allergies could be a contributor, and he will avoid that in the near future. Data anxiety was a source of the symptoms, although may be contributing, and await his response to the slight increase in the antianxiety medicine noted above. We will continue to closely  monitor, with the importance of follow-up if symptoms again increasing.  3.  Erythrocytosis He has a follow-up planned with oncology next week, and await their further input.     Jamelle Haring, MD 02/03/20 11:20 AM

## 2020-02-03 ENCOUNTER — Ambulatory Visit: Payer: Self-pay | Admitting: *Deleted

## 2020-02-03 ENCOUNTER — Ambulatory Visit (INDEPENDENT_AMBULATORY_CARE_PROVIDER_SITE_OTHER): Payer: PPO | Admitting: Internal Medicine

## 2020-02-03 ENCOUNTER — Other Ambulatory Visit: Payer: Self-pay

## 2020-02-03 ENCOUNTER — Encounter: Payer: Self-pay | Admitting: Internal Medicine

## 2020-02-03 VITALS — BP 138/68 | HR 75 | Temp 97.6°F | Resp 16 | Ht 73.0 in | Wt 238.6 lb

## 2020-02-03 DIAGNOSIS — R42 Dizziness and giddiness: Secondary | ICD-10-CM

## 2020-02-03 DIAGNOSIS — D751 Secondary polycythemia: Secondary | ICD-10-CM

## 2020-02-03 DIAGNOSIS — F419 Anxiety disorder, unspecified: Secondary | ICD-10-CM

## 2020-02-03 MED ORDER — BUSPIRONE HCL 10 MG PO TABS: ORAL_TABLET | ORAL | 1 refills | Status: DC

## 2020-02-03 NOTE — Telephone Encounter (Signed)
  Wanted to know what Buspar is for.  Reason for Disposition . Caller has medicine question only, adult not sick, AND triager answers question  Answer Assessment - Initial Assessment Questions 1. NAME of MEDICATION: "What medicine are you calling about?"     Buspar 2. QUESTION: "What is your question?" (e.g., medication refill, side effect)     What is Buspar for 3. PRESCRIBING HCP: "Who prescribed it?" Reason: if prescribed by specialist, call should be referred to that group.     Dr. Dorris Fetch, [place] 4. SYMPTOMS: "Do you have any symptoms?"     no 5. SEVERITY: If symptoms are present, ask "Are they mild, moderate or severe?"     no 6. PREGNANCY:  "Is there any chance that you are pregnant?" "When was your last menstrual period?"     no  Protocols used: MEDICATION QUESTION CALL-A-AH  Reviewed with pt that his dose has increased today and he can take it twice a day but does not have to take the second dose if he does not want to. Also discussed spacing the timing apart close to 12 hrs. Verbalized understanding.

## 2020-02-04 DIAGNOSIS — H1033 Unspecified acute conjunctivitis, bilateral: Secondary | ICD-10-CM | POA: Diagnosis not present

## 2020-02-06 ENCOUNTER — Other Ambulatory Visit: Payer: Self-pay | Admitting: Urology

## 2020-02-08 ENCOUNTER — Telehealth: Payer: Self-pay

## 2020-02-08 NOTE — Telephone Encounter (Signed)
Copied from CRM [id number]. Topic: General - Other >> Feb 08, 2020 10:42 AM Lyn Hollingshead D wrote: Pharmacy contact PT about one of his medicine been denied, he dosent know the name / please advise

## 2020-02-10 ENCOUNTER — Encounter: Payer: Self-pay | Admitting: Internal Medicine

## 2020-02-10 ENCOUNTER — Other Ambulatory Visit: Payer: Self-pay

## 2020-02-10 ENCOUNTER — Inpatient Hospital Stay (HOSPITAL_BASED_OUTPATIENT_CLINIC_OR_DEPARTMENT_OTHER): Payer: PPO | Admitting: Internal Medicine

## 2020-02-10 ENCOUNTER — Inpatient Hospital Stay: Payer: PPO

## 2020-02-10 ENCOUNTER — Inpatient Hospital Stay: Payer: PPO | Attending: Internal Medicine

## 2020-02-10 DIAGNOSIS — K219 Gastro-esophageal reflux disease without esophagitis: Secondary | ICD-10-CM | POA: Diagnosis not present

## 2020-02-10 DIAGNOSIS — Z79899 Other long term (current) drug therapy: Secondary | ICD-10-CM | POA: Diagnosis not present

## 2020-02-10 DIAGNOSIS — M1990 Unspecified osteoarthritis, unspecified site: Secondary | ICD-10-CM | POA: Diagnosis not present

## 2020-02-10 DIAGNOSIS — E7800 Pure hypercholesterolemia, unspecified: Secondary | ICD-10-CM | POA: Diagnosis not present

## 2020-02-10 DIAGNOSIS — D751 Secondary polycythemia: Secondary | ICD-10-CM | POA: Diagnosis not present

## 2020-02-10 DIAGNOSIS — I10 Essential (primary) hypertension: Secondary | ICD-10-CM | POA: Insufficient documentation

## 2020-02-10 DIAGNOSIS — Z8673 Personal history of transient ischemic attack (TIA), and cerebral infarction without residual deficits: Secondary | ICD-10-CM | POA: Insufficient documentation

## 2020-02-10 DIAGNOSIS — G4730 Sleep apnea, unspecified: Secondary | ICD-10-CM | POA: Insufficient documentation

## 2020-02-10 LAB — BASIC METABOLIC PANEL
Anion gap: 9 (ref 5–15)
BUN: 15 mg/dL (ref 8–23)
CO2: 25 mmol/L (ref 22–32)
Calcium: 9 mg/dL (ref 8.9–10.3)
Chloride: 105 mmol/L (ref 98–111)
Creatinine, Ser: 1.21 mg/dL (ref 0.61–1.24)
GFR, Estimated: 57 mL/min — ABNORMAL LOW (ref >60)
Glucose, Bld: 97 mg/dL (ref 70–99)
Potassium: 4.2 mmol/L (ref 3.5–5.1)
Sodium: 139 mmol/L (ref 135–145)

## 2020-02-10 LAB — CBC WITH DIFFERENTIAL/PLATELET
Abs Immature Granulocytes: 0.05 10*3/uL (ref 0.00–0.07)
Basophils Absolute: 0 10*3/uL (ref 0.0–0.1)
Basophils Relative: 0 %
Eosinophils Absolute: 0.1 10*3/uL (ref 0.0–0.5)
Eosinophils Relative: 1 %
HCT: 48 % (ref 39.0–52.0)
Hemoglobin: 17.1 g/dL — ABNORMAL HIGH (ref 13.0–17.0)
Immature Granulocytes: 1 %
Lymphocytes Relative: 17 %
Lymphs Abs: 1.4 10*3/uL (ref 0.7–4.0)
MCH: 31.4 pg (ref 26.0–34.0)
MCHC: 35.6 g/dL (ref 30.0–36.0)
MCV: 88.1 fL (ref 80.0–100.0)
Monocytes Absolute: 0.6 10*3/uL (ref 0.1–1.0)
Monocytes Relative: 7 %
Neutro Abs: 6 10*3/uL (ref 1.7–7.7)
Neutrophils Relative %: 74 %
Platelets: 268 10*3/uL (ref 150–400)
RBC: 5.45 MIL/uL (ref 4.22–5.81)
RDW: 12.2 % (ref 11.5–15.5)
WBC: 8.2 10*3/uL (ref 4.0–10.5)
nRBC: 0 % (ref 0.0–0.2)

## 2020-02-10 NOTE — Assessment & Plan Note (Signed)
#   Erythrocytosis isolated-unclear etiology/however SECONDARY -negative Jak 2/normal erythropoietin levels.  #Today hemoglobin 17; hematocrit  48; patient is asymptomatic hold off phlebotomy.  # HTN- STABLE.  # DISPOSITION: # NO phelbotomy today # Follow up in 12 months/MD/lab- cbc/bmp/possible Phlebtotomy- Dr.B

## 2020-02-10 NOTE — Progress Notes (Signed)
[facility] CONSULT NOTE  Patient Care Team: Jamelle Haring, MD as PCP - General (Internal Medicine) Garen Grams, MD as Consulting Physician (Cardiology) Earna Coder, MD as Consulting Physician (Hematology and Oncology)  CHIEF COMPLAINTS/PURPOSE OF CONSULTATION: Erythrocytosis  # NOV 2019- SECONDARY Erythrocytosis [negative Jak 2/normal erythropoietin levels; ER/dizzyness.] s/p Phlebotomy x1; on surveillaince  # Hx of mini-stroke/ chronic intermittent mild elevation of bilirubin-question Gilbert's syndrome.  Oncology History   No history exists.     HISTORY OF PRESENTING ILLNESS:  Jeffery Campbell 86 y.o.  male is here for follow-up with likely secondary erythrocytosis is here for follow-up.  Patient denies any worsening shortness of breath or cough.  Denies any fevers or chills.  Denies any headaches.  No strokes.   Review of Systems  Constitutional: Negative for chills, diaphoresis, fever, malaise/fatigue and weight loss.  HENT: Negative for nosebleeds and sore throat.   Eyes: Negative for double vision.  Respiratory: Negative for cough, hemoptysis, sputum production, shortness of breath and wheezing.   Cardiovascular: Negative for chest pain, palpitations, orthopnea and leg swelling.  Gastrointestinal: Negative for abdominal pain, blood in stool, constipation, diarrhea, heartburn, melena, nausea and vomiting.  Genitourinary: Negative for dysuria, frequency and urgency.  Musculoskeletal: Negative for back pain and joint pain.  Skin: Negative.  Negative for itching and rash.  Neurological: Negative for tingling, focal weakness, weakness and headaches.  Endo/Heme/Allergies: Does not bruise/bleed easily.  Psychiatric/Behavioral: Negative for depression. The patient is not nervous/anxious and does not have insomnia.      MEDICAL HISTORY:  Past Medical History:  Diagnosis Date  . Arrhythmia   . Cough    CHRONIC  . Depression   .  Disturbance of sleep   . Diverticulitis   . GERD (gastroesophageal reflux disease)   . Hemorrhoids   . Hypertension   . OA (osteoarthritis)   . Pure hypercholesterolemia   . Sleep apnea   . Stroke [facility])     SURGICAL HISTORY: Past Surgical History:  Procedure Laterality Date  . CATARACT EXTRACTION W/PHACO Left 02/14/2016   Procedure: CATARACT EXTRACTION PHACO AND INTRAOCULAR LENS PLACEMENT (IOC);  Surgeon: Galen Manila, MD;  Location: [facility] ORS;  Service: Ophthalmology;  Laterality: Left;  [place] 53.7 AP% 21.5 CDE 11.58 FLUID PACK LOT # [id number]  . CATARACT EXTRACTION W/PHACO Right 12/18/2018   Procedure: CATARACT EXTRACTION PHACO AND INTRAOCULAR LENS PLACEMENT (IOC)  RIGHT, VISION BLUE;  Surgeon: Elliot Cousin, MD;  Location: [facility] ORS;  Service: Ophthalmology;  Laterality: Right;  [place]  01:14 CDE 11.82 Fluid pack lot # [id number]  . COLONOSCOPY    . KNEE ARTHROSCOPY    . TRANSURETHRAL RESECTION OF PROSTATE      SOCIAL HISTORY: Social History   Socioeconomic History  . Marital status: Divorced    Spouse name: Not on file  . Number of children: 3  . Years of education: Not on file  . Highest education level: 11th grade  Occupational History  . Occupation: Retired  Tobacco Use  . Smoking status: Never Smoker  . Smokeless tobacco: Never Used  . Tobacco comment: smoking cessation materials not required  Vaping Use  . Vaping Use: Never used  Substance and Sexual Activity  . Alcohol use: No    Alcohol/week: 0.0 standard drinks  . Drug use: No  . Sexual activity: Not Currently  Other Topics Concern  . Not on file  Social History Narrative   Lives at home alone.   Writes left-handed - does everything else  with right-hand.   No caffeine use.      Never smoker; never alcohol; used to be truck driver; retired part time from Systems analyst course. 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    Social Determinants of Health   Financial Resource Strain: Low Risk   . Difficulty of Paying Living Expenses: Not  hard at all  Food Insecurity: No Food Insecurity  . Worried About Programme researcher, broadcasting/film/video in the Last Year: Never true  . Ran Out of Food in the Last Year: Never true  Transportation Needs: No Transportation Needs  . Lack of Transportation (Medical): No  . Lack of Transportation (Non-Medical): No  Physical Activity: Inactive  . Days of Exercise per Week: 0 days  . Minutes of Exercise per Session: 0 min  Stress: No Stress Concern Present  . Feeling of Stress : Not at all  Social Connections: Moderately Isolated  . Frequency of Communication with Friends and Family: More than three times a week  . Frequency of Social Gatherings with Friends and Family: Twice a week  . Attends Religious Services: More than 4 times per year  . Active Member of Clubs or Organizations: No  . Attends Banker Meetings: Never  . Marital Status: Divorced  Catering manager Violence: Not At Risk  . Fear of Current or Ex-Partner: No  . Emotionally Abused: No  . Physically Abused: No  . Sexually Abused: No    FAMILY HISTORY: Family History  Problem Relation Age of Onset  . Hypertension Son        Posey Rea of parents health.  . Prostate cancer Neg Hx   . Bladder Cancer Neg Hx   . Kidney cancer Neg Hx     ALLERGIES:  has No Known Allergies.  MEDICATIONS:  Current Outpatient Medications  Medication Sig Dispense Refill  . acetaminophen (TYLENOL) 325 MG tablet Take 325-650 mg by mouth every 6 (six) hours as needed for moderate pain or headache.    . alfuzosin (UROXATRAL) 10 MG 24 hr tablet TAKE 1 TABLET (10 MG TOTAL) BY MOUTH DAILY WITH BREAKFAST. 90 tablet 0  . atorvastatin (LIPITOR) 20 MG tablet TAKE 1 TABLET BY MOUTH EVERYDAY AT BEDTIME 90 tablet 3  . busPIRone (BUSPAR) 10 MG tablet TAKE 1 TABLET BY MOUTH 2 TIMES DAILY AS NEEDED. 180 tablet 1  . clopidogrel (PLAVIX) 75 MG tablet Take 1 tablet (75 mg total) by mouth daily. (stop aggrenox) 90 tablet 1  . Cyanocobalamin (B-12) 3000 MCG SUBL Place  3,000 mcg under the tongue daily.  30 tablet 11  . diltiazem (CARDIZEM CD) 120 MG 24 hr capsule TAKE 1 CAPSULE BY MOUTH EVERY DAY 90 capsule 1  . docusate sodium (COLACE) 100 MG capsule Take 100 mg by mouth daily as needed for mild constipation.    . finasteride (PROSCAR) 5 MG tablet TAKE 1 TABLET BY MOUTH EVERY DAY 90 tablet 3  . fluticasone (FLONASE) 50 MCG/ACT nasal spray PLACE 2 SPRAYS INTO BOTH NOSTRILS DAILY AS NEEDED FOR ALLERGIES OR RHINITIS. 48 mL 2  . FLUZONE HIGH-DOSE QUADRIVALENT 0.7 ML SUSY     . levocetirizine (XYZAL) 5 MG tablet TAKE 1 TABLET BY MOUTH EVERY DAY IN THE EVENING 90 tablet 3  . MAXITROL 3.5-10000-0.1 ophthalmic suspension Apply 1 drop to eye 3 (three) times daily.    . meclizine (ANTIVERT) 25 MG tablet TAKE 1/2 TO 1 TABLET (12.5-25 MG TOTAL) BY MOUTH UP TO TWICE DAILY AS NEEDED FOR DIZZINESS, USE SPARINGLY 30 tablet 1  . Omega-3  Fatty Acids (FISH OIL) 1200 MG CAPS Take by mouth.    . Propylene Glycol (SYSTANE BALANCE) 0.6 % SOLN Place 1 drop into both eyes 3 (three) times daily as needed (dry eyes).     No current facility-administered medications for this visit.      [name]  PHYSICAL EXAMINATION: ECOG PERFORMANCE STATUS: 1 - Symptomatic but completely ambulatory  Vitals:   02/10/20 1315  BP: (!) 159/62  Pulse: 80  Resp: 20  Temp: (!) 97.2 F (36.2 C)   Filed Weights   02/10/20 1315  Weight: 232 lb (105.2 kg)    Physical Exam HENT:     Head: Normocephalic and atraumatic.     Mouth/Throat:     Pharynx: No oropharyngeal exudate.  Eyes:     Pupils: Pupils are equal, round, and reactive to light.  Cardiovascular:     Rate and Rhythm: Normal rate and regular rhythm.  Pulmonary:     Effort: No respiratory distress.     Breath sounds: No wheezing.  Abdominal:     General: Bowel sounds are normal. There is no distension.     Palpations: Abdomen is soft. There is no mass.     Tenderness: There is no abdominal tenderness. There is no guarding or rebound.   Musculoskeletal:        General: No tenderness. Normal range of motion.     Cervical back: Normal range of motion and neck supple.  Skin:    General: Skin is warm.  Neurological:     Mental Status: He is alert and oriented to person, place, and time.  Psychiatric:        Mood and Affect: Affect normal.      LABORATORY DATA:  I have reviewed the data as listed Lab Results  Component Value Date   WBC 8.2 02/10/2020   HGB 17.1 (H) 02/10/2020   HCT 48.0 02/10/2020   MCV 88.1 02/10/2020   PLT 268 02/10/2020   Recent Labs    02/11/19 1251 11/12/19 1038 02/10/20 1259  NA 139 139 139  K 4.1 4.0 4.2  CL 109 106 105  CO2 24 27 25   GLUCOSE 100* 89 97  BUN 14 11 15   CREATININE 1.15 1.19* 1.21  CALCIUM 8.9 9.2 9.0  GFRNONAA 57* 54* 57*  GFRAA >60 63  --   PROT  --  6.4  --   AST  --  11  --   ALT  --  16  --   BILITOT  --  1.6*  --     RADIOGRAPHIC STUDIES: I have personally reviewed the radiological images as listed and agreed with the findings in the report. No results found.  ASSESSMENT & PLAN:   Erythrocytosis # Erythrocytosis isolated-unclear etiology/however SECONDARY -negative Jak 2/normal erythropoietin levels.  #Today hemoglobin 17; hematocrit  48; patient is asymptomatic hold off phlebotomy.  # HTN- STABLE.  # DISPOSITION: # NO phelbotomy today # Follow up in 12 months/MD/lab- cbc/bmp/possible Phlebtotomy- Dr.B  All questions were answered. The patient knows to call the clinic with any problems, questions or concerns.   , MD 02/10/2020 1:34 PM

## 2020-02-19 DIAGNOSIS — H1033 Unspecified acute conjunctivitis, bilateral: Secondary | ICD-10-CM | POA: Diagnosis not present

## 2020-02-22 ENCOUNTER — Other Ambulatory Visit: Payer: Self-pay | Admitting: Internal Medicine

## 2020-02-22 DIAGNOSIS — R42 Dizziness and giddiness: Secondary | ICD-10-CM

## 2020-02-27 DIAGNOSIS — G4736 Sleep related hypoventilation in conditions classified elsewhere: Secondary | ICD-10-CM | POA: Diagnosis not present

## 2020-02-27 DIAGNOSIS — D751 Secondary polycythemia: Secondary | ICD-10-CM | POA: Diagnosis not present

## 2020-02-27 DIAGNOSIS — G4733 Obstructive sleep apnea (adult) (pediatric): Secondary | ICD-10-CM | POA: Diagnosis not present

## 2020-03-28 ENCOUNTER — Other Ambulatory Visit: Payer: Self-pay | Admitting: Urology

## 2020-03-28 ENCOUNTER — Other Ambulatory Visit: Payer: Self-pay | Admitting: Internal Medicine

## 2020-03-28 DIAGNOSIS — G4736 Sleep related hypoventilation in conditions classified elsewhere: Secondary | ICD-10-CM | POA: Diagnosis not present

## 2020-03-28 DIAGNOSIS — R42 Dizziness and giddiness: Secondary | ICD-10-CM

## 2020-03-28 DIAGNOSIS — G4733 Obstructive sleep apnea (adult) (pediatric): Secondary | ICD-10-CM | POA: Diagnosis not present

## 2020-03-28 DIAGNOSIS — D751 Secondary polycythemia: Secondary | ICD-10-CM | POA: Diagnosis not present

## 2020-03-28 NOTE — Telephone Encounter (Signed)
Medication:  meclizine (ANTIVERT) 25 MG tablet [[id number]]   Has the patient contacted their pharmacy? no (Agent: If no, request that the patient contact the pharmacy for the refill.)   Preferred Pharmacy (with phone number or street name):  [facility] [address] [place] - [address]  [address] [place], [place] [address]  Phone:  [phone] Fax:  [phone]  DEA #:  [id number] Agent: Please be advised that RX refills may take up to 3 business days. We ask that you follow-up with your pharmacy.

## 2020-03-28 NOTE — Telephone Encounter (Signed)
Medication Refill - Medication: meclizine 25 m Has the patient contacted their pharmacy? yes (Agent: If no, request that the patient contact the pharmacy for the refill.) (Agent: If yes, when and what did the pharmacy advise?) per pt pharm has not received anything from md office. I told patient he has one refill left he said for [place] to call pharm  Preferred Pharmacy (with phone number or street name):[facility] [address] [facility] [place] Agent: Please be advised that RX refills may take up to 3 business days. We ask that you follow-up with your pharmacy.

## 2020-03-28 NOTE — Telephone Encounter (Signed)
Requested medication (s) are due for refill today: no  Requested medication (s) are on the active medication list: yes  Last refill:  02/23/20 #30 with 1 refill  Future visit scheduled: yes  Notes to clinic:  Please review for refill. Refill not delegated per protocol.     Requested Prescriptions  Pending Prescriptions Disp Refills   meclizine (ANTIVERT) 25 MG tablet 30 tablet 1    Sig: TAKE 1/2 TO 1 TABLET BY MOUTH UP TO TWICE DAILY AS NEEDED FOR DIZZINESS, USE SPARINGLY      Not Delegated - Gastroenterology: Antiemetics Failed - 03/28/2020 11:30 AM      Failed - This refill cannot be delegated      Passed - Valid encounter within last 6 months    Recent Outpatient Visits           1 month ago Dizzy spells   [facility] Welford Roche D, MD   2 months ago Benign localized prostatic hyperplasia with lower urinary tract symptoms (LUTS)   [facility] Jamelle Haring, MD   3 months ago Bradycardia   [facility] Jamelle Haring, MD   4 months ago Mixed hyperlipidemia   [facility] Jamelle Haring, MD   8 months ago Essential hypertension   [facility] Jamelle Haring, MD       Future Appointments             In 2 weeks Jamelle Haring, MD [facility]   In 1 month Jamelle Haring, MD [facility]   In 7 months  [facility]   In 8 months [name], Elana Alm [facility]

## 2020-03-28 NOTE — Telephone Encounter (Signed)
Requested medication (s) are due for refill today: Yes  Requested medication (s) are on the active medication list: Yes  Last refill:  02/22/20  Future visit scheduled: Yes  Notes to clinic:  Diagnosis code needed     Requested Prescriptions  Pending Prescriptions Disp Refills   meclizine (ANTIVERT) 25 MG tablet 30 tablet 1    Sig: TAKE 1/2 TO 1 TABLET BY MOUTH UP TO TWICE DAILY AS NEEDED FOR DIZZINESS, USE SPARINGLY      There is no refill protocol information for this order

## 2020-03-29 ENCOUNTER — Other Ambulatory Visit: Payer: Self-pay | Admitting: Urology

## 2020-03-29 ENCOUNTER — Other Ambulatory Visit: Payer: Self-pay | Admitting: Family Medicine

## 2020-03-29 DIAGNOSIS — J329 Chronic sinusitis, unspecified: Secondary | ICD-10-CM

## 2020-03-29 NOTE — Telephone Encounter (Signed)
Pt called and is requesting to have this medication sent over for him today is possible. He states that he is completely out of this medication. Please advise.

## 2020-03-29 NOTE — Progress Notes (Signed)
Patient ID: Jeffery Campbell, male    DOB: [birth year], 86 y.o.   MRN: [id number]  PCP: Jamelle Haring, MD  Chief Complaint  Patient presents with  . Medication Refill    Subjective:   Jeffery Campbell is a 86 y.o. male, presents to clinic with CC of the following:  Chief Complaint  Patient presents with  . Medication Refill    HPI:  Patient is a 86 y.o.male Last visit was 02/03/20 F/u today for med refill  Saw oncology 02/10/20 fro f/u - erythrocytosis, with the following A/P noted.  ASSESSMENT & PLAN:  Erythrocytosis # Erythrocytosis isolated-unclear etiology/however SECONDARY -negative Jak 2/normal erythropoietin levels.  #Today hemoglobin 17; hematocrit  48; patient is asymptomatic hold off phlebotomy.  # HTN- STABLE.  # DISPOSITION: # NO phelbotomy today # Follow up in 12 months/MD/lab- cbc/bmp/possible Phlebtotomy- Dr.B  He had been prescribed Celexa previously for anxiety with depression issues, stopped and was changed to BuSpar 5 mg twice daily, and noted that had been helpful on a recent prior visit.  Was increased to 10mg  bid after our last visit in November. He noted really helped. He denied marked anxiety concerns.  Denied feeling overly nervous, panicked, and denied any difficulty sleeping recently.    He had noted previously feeling bad in the morning when he first wakes up, questioned a dizzy feeling, and a nurse noted his heart rate was low, and I had seen him after. He was referred to cardiology, with their recommendations not to pursue a monitor presently. He was not bradycardic on that visit with cardiology He noted again no other concerning symptoms when he was feeling bad with no chest pains, palpitations, shortness of breath, one-sided symptoms, and again when got up and about was feeling much better after about 15 minutes.  Denied any pain, no recent falls. Notes has not had a return of any episodes of feeling bad in the morning for at least 4  weeks now.  Notes still takes the meclizine product as needed to help at times and wanted to get that refilled.  Notes things going well in recent past   On his most recent visit with cardiology, it was noted his frequent PVCs improved with diltiazem, and presently was asymptomatic. He was seen previously for feeling bad in the morning for a brief interval of time, and was noted to be bradycardic at one point.  Cardiology did not recommend pursuing a monitor on that recent visit as well. Also on that cardiology visit, has HTN and his blood pressure was mildly elevated, although no changes made in his regimen at that time. Also noted was the h/o CVA and on Plavix  Denies recent CP, SOB, palpitations, no increased LE swelling  Hyperlipidemia:  Current Medication Regimen:lipitor 20 mgdaily  Takes medication regularly   Lab Results  Component Value Date   CHOL 126 11/12/2019   HDL 45 11/12/2019   LDLCALC 64 11/12/2019   TRIG 87 11/12/2019   CHOLHDL 2.8 11/12/2019    He last saw urology 11/24/2019 for follow-up for BPH with LUTS and the following noted             BPH WITH LUTS (prostate and/or bladder) IPSS score:10/1PVR: 42 mLmL Previous score: 8/2Previous PVR: 24mL Major complaint(s):None. Intermittency and post void dribbling has improved.Patient denies any modifying or aggravating factors. Patient denies any gross hematuria, dysuria or suprapubic/flank pain. Patient denies any fevers, chills, nausea or vomiting.  Currently taking: alfuzosin 10 mg  dailyand finasteride 5 mg daily  Serum creatinine at baseline  1. BPH with LUTS I PSS score10/1, it isstable Continue conservative management, avoiding bladder irritants and timed voiding's Continue alfuzosin 10 mgandfinasteride 5 mg daily Follow-up in2312months for I PSS and PVR  Return in about 1 year (around 11/23/2020) for IPSS, PVR and exam.  CKD - Stage 3 prior Lab Results  Component Value Date    CREATININE 1.21 02/10/2020   BUN 15 02/10/2020   NA 139 02/10/2020   K 4.2 02/10/2020   CL 105 02/10/2020   CO2 25 02/10/2020     Severe OSA with nocturnal hypoxemia - last f/u with pulm was 08/19/2018 Has home CPAP and using at night presently, about 2-3 hours usually.He was not convinced that he needed to continue this, although stated he will after our discussion previously as I did think it would be helpful.   Patient Active Problem List   Diagnosis Date Noted  . Obstructive sleep apnea syndrome 11/12/2019  . Anxiety with depression 07/13/2019  . Benign localized prostatic hyperplasia with lower urinary tract symptoms (LUTS) 07/13/2019  . Recurrent major depressive disorder, in partial remission (HCC) 10/08/2018  . Erythrocytosis 01/31/2018  . Hx of completed stroke 01/10/2018  . Prediabetes 10/07/2017  . Dizziness 07/03/2017  . Abnormal gait 10/05/2016  . Hypoxia, sleep related 04/20/2016  . Elevated hematocrit 04/04/2016  . Right flank pain 01/10/2016  . General unsteadiness 05/24/2015  . Allergic rhinitis due to pollen 11/12/2014  . Back pain, chronic 10/21/2014  . Benign paroxysmal positional nystagmus 10/21/2014  . Decreased libido 10/21/2014  . Fatigue 10/21/2014  . Gastro-esophageal reflux disease without esophagitis 10/21/2014  . Dysmetabolic syndrome 10/21/2014  . Fungal infection of nail 10/21/2014  . HLD (hyperlipidemia) 08/19/2014  . CKD (chronic kidney disease) stage 3, GFR 30-59 ml/min (HCC) 08/19/2014  . Class 1 obesity due to excess calories with serious comorbidity and body mass index (BMI) of 30.0 to 30.9 in adult   . Asymptomatic PVCs 05/27/2014  . Bradycardia 04/05/2014  . Essential hypertension 04/05/2014  . Osteoarthrosis involving more than one site but not generalized 08/03/2008      Current Outpatient Medications:  .  busPIRone (BUSPAR) 5 MG tablet, Take 5 mg by mouth 2 (two) times daily., Disp: , Rfl:  .  acetaminophen (TYLENOL)  325 MG tablet, Take 325-650 mg by mouth every 6 (six) hours as needed for moderate pain or headache., Disp: , Rfl:  .  alfuzosin (UROXATRAL) 10 MG 24 hr tablet, TAKE 1 TABLET (10 MG TOTAL) BY MOUTH DAILY WITH BREAKFAST., Disp: 90 tablet, Rfl: 0 .  atorvastatin (LIPITOR) 20 MG tablet, TAKE 1 TABLET BY MOUTH EVERYDAY AT BEDTIME, Disp: 90 tablet, Rfl: 3 .  busPIRone (BUSPAR) 10 MG tablet, TAKE 1 TABLET BY MOUTH 2 TIMES DAILY AS NEEDED., Disp: 180 tablet, Rfl: 1 .  clopidogrel (PLAVIX) 75 MG tablet, Take 1 tablet (75 mg total) by mouth daily. (stop aggrenox), Disp: 90 tablet, Rfl: 1 .  Cyanocobalamin (B-12) 3000 MCG SUBL, Place 3,000 mcg under the tongue daily. , Disp: 30 tablet, Rfl: 11 .  diltiazem (CARDIZEM CD) 120 MG 24 hr capsule, TAKE 1 CAPSULE BY MOUTH EVERY DAY, Disp: 90 capsule, Rfl: 1 .  docusate sodium (COLACE) 100 MG capsule, Take 100 mg by mouth daily as needed for mild constipation., Disp: , Rfl:  .  finasteride (PROSCAR) 5 MG tablet, TAKE 1 TABLET BY MOUTH EVERY DAY, Disp: 90 tablet, Rfl: 3 .  fluticasone (FLONASE) 50  MCG/ACT nasal spray, PLACE 2 SPRAYS INTO BOTH NOSTRILS DAILY AS NEEDED FOR ALLERGIES OR RHINITIS., Disp: 48 mL, Rfl: 2 .  FLUZONE HIGH-DOSE QUADRIVALENT 0.7 ML SUSY, , Disp: , Rfl:  .  levocetirizine (XYZAL) 5 MG tablet, TAKE 1 TABLET BY MOUTH EVERY DAY IN THE EVENING, Disp: 90 tablet, Rfl: 3 .  MAXITROL 3.5-10000-0.1 ophthalmic suspension, Apply 1 drop to eye 3 (three) times daily., Disp: , Rfl:  .  meclizine (ANTIVERT) 25 MG tablet, TAKE 1/2 TO 1 TABLET BY MOUTH UP TO TWICE DAILY AS NEEDED FOR DIZZINESS, USE SPARINGLY, Disp: 30 tablet, Rfl: 1 .  Omega-3 Fatty Acids (FISH OIL) 1200 MG CAPS, Take by mouth., Disp: , Rfl:  .  Propylene Glycol (SYSTANE BALANCE) 0.6 % SOLN, Place 1 drop into both eyes 3 (three) times daily as needed (dry eyes)., Disp: , Rfl:    No Known Allergies   Past Surgical History:  Procedure Laterality Date  . CATARACT EXTRACTION W/PHACO Left  02/14/2016   Procedure: CATARACT EXTRACTION PHACO AND INTRAOCULAR LENS PLACEMENT (IOC);  Surgeon: Galen Manila, MD;  Location: [facility] ORS;  Service: Ophthalmology;  Laterality: Left;  [place] 53.7 AP% 21.5 CDE 11.58 FLUID PACK LOT # [id number]  . CATARACT EXTRACTION W/PHACO Right 12/18/2018   Procedure: CATARACT EXTRACTION PHACO AND INTRAOCULAR LENS PLACEMENT (IOC)  RIGHT, VISION BLUE;  Surgeon: Elliot Cousin, MD;  Location: [facility] OR[facility];  Service: Ophthalmology;  Laterality: Right;  [place]  01:14 CDE 11.82 Fluid pack lot # [id number]  . COLONOSCOPY    . KNEE ARTHROSCOPY    . TRANSURETHRAL RESECTION OF PROSTATE       Family History  Problem Relation Age of Onset  . Hypertension Son        Posey Rea of parents health.  . Prostate cancer Neg Hx   . Bladder Cancer Neg Hx   . Kidney cancer Neg Hx      Social History   Tobacco Use  . Smoking status: Never Smoker  . Smokeless tobacco: Never Used  . Tobacco comment: smoking cessation materials not required  Substance Use Topics  . Alcohol use: No    Alcohol/week: 0.0 standard drinks    With staff assistance, above reviewed with the patient today.  ROS: As per HPI, otherwise no specific complaints on a limited and focused system review   No results found for this or any previous visit (from the past 72 hour(s)).   PHQ2/9: Depression screen [facility] 2/9 02/03/2020 01/05/2020 12/03/2019 11/12/2019 10/29/2019  Decreased Interest 0 0 0 0 0  Down, Depressed, Hopeless 0 0 0 0 0  PHQ - 2 Score 0 0 0 0 0  Altered sleeping 0 - - 0 -  Tired, decreased energy 0 - - 0 -  Change in appetite 0 - - 0 -  Feeling bad or failure about yourself  0 - - 0 -  Trouble concentrating 0 - - 0 -  Moving slowly or fidgety/restless 0 - - 0 -  Suicidal thoughts 0 - - 0 -  PHQ-9 Score 0 - - 0 -  Difficult doing work/chores - - - Not difficult at all -  Some recent data might be hidden    Fall Risk: Fall Risk  02/03/2020 01/05/2020 12/03/2019 11/12/2019 10/29/2019  Falls in the  past year? 0 0 0 0 0  Number falls in past yr: 0 0 0 0 0  Injury with Fall? 0 0 0 0 0  Risk for fall due to : - - - -  Impaired balance/gait;Impaired mobility  Risk for fall due to: Comment - - - - -  Follow up - - Falls evaluation completed - Falls prevention discussed      Objective:   Vitals:   04/04/20 0857 04/04/20 0906  BP: (!) 170/80 130/60  Pulse: 73   Resp: 16   Temp: 98.4 F (36.9 C)   TempSrc: Oral   SpO2: 94%   Weight: 242 lb 9.6 oz (110 kg)   Height:  (1.854 m)     Body mass index is 32.01 kg/m.  Physical Exam   NAD, masked, very pleasant, HEENT - [place]/AT, sclera anicteric,PERRL, conj - non-inj'ed,  Neck - supple, no adenopathy,  Car - RRR without m/g/r, not bradycardic Pulm- RR and effort normal at rest, CTA without wheeze or rales Abd - soft,obese,NTdiffusely,  Back - no CVA tenderness Ext - no LE edema,  Neuro/psychiatric - affect was not flat, appropriate with conversation Alert and oriented             Grossly nonfocal Was able to arise from the chair on his own, and get to the exam table and step up on the exam table with some mild assistance,  Speech normal  Results for orders placed or performed in visit on 02/10/20  Basic metabolic panel  Result Value Ref Range   Sodium 139 135 - 145 mmol/L   Potassium 4.2 3.5 - 5.1 mmol/L   Chloride 105 98 - 111 mmol/L   CO2 25 22 - 32 mmol/L   Glucose, Bld 97 70 - 99 mg/dL   BUN 15 8 - 23 mg/dL   Creatinine, Ser 4.09 0.61 - 1.24 mg/dL   Calcium 9.0 8.9 - 81.1 mg/dL   GFR, Estimated 57 (L) >60 mL/min   Anion gap 9 5 - 15  CBC with Differential  Result Value Ref Range   WBC 8.2 4.0 - 10.5 K/uL   RBC 5.45 4.22 - 5.81 MIL/uL   Hemoglobin 17.1 (H) 13.0 - 17.0 g/dL   HCT 91.4 78.2 - 95.6 %   MCV 88.1 80.0 - 100.0 fL   MCH 31.4 26.0 - 34.0 pg   MCHC 35.6 30.0 - 36.0 g/dL   RDW 21.3 08.6 - 57.8 %   Platelets 268 150 - 400 K/uL   nRBC 0.0 0.0 - 0.2 %    Neutrophils Relative % 74 %   Neutro Abs 6.0 1.7 - 7.7 K/uL   Lymphocytes Relative 17 %   Lymphs Abs 1.4 0.7 - 4.0 K/uL   Monocytes Relative 7 %   Monocytes Absolute 0.6 0.1 - 1.0 K/uL   Eosinophils Relative 1 %   Eosinophils Absolute 0.1 0.0 - 0.5 K/uL   Basophils Relative 0 %   Basophils Absolute 0.0 0.0 - 0.1 K/uL   Immature Granulocytes 1 %   Abs Immature Granulocytes 0.05 0.00 - 0.07 K/uL       Assessment & Plan:    1. Erythrocytosis Continue to follow-up with oncology, with his last visit with them as reviewed above  2. Anxiety Patient noted a history of anxiety with depression in the past, and may be a little more anxious noted last visit and Buspar increased to  bid, and he noted he feels improved since that increase. Continue the BuSpar presently.  3. Dizzy spells Noted has felt good in the recent past, denied episodes of feeling bad in the morning like had previously in the recent past. Still uses a meclizine product 1/2 tablet to  1 tablet up to twice a day as needed.  Okay to refill it today as requested, and emphasized trying to use this sparingly, and not using it if not needed. He was understanding of this recommendation today.  4. Frequent PVCs Continues to see cardiology. Remains improved with the diltiazem product  5. Essential hypertension Blood pressure remains controlled presently.  6. Hx of completed stroke Continues on Plavix.  7. Mixed hyperlipidemia Remains on a statin product.  8. Benign localized prostatic hyperplasia with lower urinary tract symptoms (LUTS) Continues to follow with urology  9. Stage 3a chronic kidney disease (HCC) Stable with last labs checked in November good.  10. Obstructive sleep apnea syndrome Continues to use CPAP, and presently using about 2 to 3 hours a night.  Did feel continuing this would be helpful.  We will follow-up again in approximately 3 months time, sooner as needed. He is aware that his follow-up  will be with a new provider as I will be leaving this practice prior to that planned follow-up visit.       Jamelle Haring, MD 04/04/20 9:54 AM

## 2020-04-02 DIAGNOSIS — D751 Secondary polycythemia: Secondary | ICD-10-CM | POA: Diagnosis not present

## 2020-04-02 DIAGNOSIS — G4733 Obstructive sleep apnea (adult) (pediatric): Secondary | ICD-10-CM | POA: Diagnosis not present

## 2020-04-02 DIAGNOSIS — G4736 Sleep related hypoventilation in conditions classified elsewhere: Secondary | ICD-10-CM | POA: Diagnosis not present

## 2020-04-04 ENCOUNTER — Other Ambulatory Visit: Payer: Self-pay

## 2020-04-04 ENCOUNTER — Ambulatory Visit (INDEPENDENT_AMBULATORY_CARE_PROVIDER_SITE_OTHER): Payer: PPO | Admitting: Internal Medicine

## 2020-04-04 VITALS — BP 130/60 | HR 73 | Temp 98.4°F | Resp 16 | Ht 73.0 in | Wt 242.6 lb

## 2020-04-04 DIAGNOSIS — G4733 Obstructive sleep apnea (adult) (pediatric): Secondary | ICD-10-CM | POA: Diagnosis not present

## 2020-04-04 DIAGNOSIS — R42 Dizziness and giddiness: Secondary | ICD-10-CM

## 2020-04-04 DIAGNOSIS — N401 Enlarged prostate with lower urinary tract symptoms: Secondary | ICD-10-CM | POA: Diagnosis not present

## 2020-04-04 DIAGNOSIS — I10 Essential (primary) hypertension: Secondary | ICD-10-CM

## 2020-04-04 DIAGNOSIS — F419 Anxiety disorder, unspecified: Secondary | ICD-10-CM

## 2020-04-04 DIAGNOSIS — D751 Secondary polycythemia: Secondary | ICD-10-CM | POA: Diagnosis not present

## 2020-04-04 DIAGNOSIS — E782 Mixed hyperlipidemia: Secondary | ICD-10-CM

## 2020-04-04 DIAGNOSIS — I493 Ventricular premature depolarization: Secondary | ICD-10-CM

## 2020-04-04 DIAGNOSIS — N1831 Chronic kidney disease, stage 3a: Secondary | ICD-10-CM | POA: Diagnosis not present

## 2020-04-04 DIAGNOSIS — Z8673 Personal history of transient ischemic attack (TIA), and cerebral infarction without residual deficits: Secondary | ICD-10-CM | POA: Diagnosis not present

## 2020-04-04 MED ORDER — MECLIZINE HCL 25 MG PO TABS: ORAL_TABLET | ORAL | 1 refills | Status: DC

## 2020-04-07 DIAGNOSIS — R42 Dizziness and giddiness: Secondary | ICD-10-CM | POA: Diagnosis not present

## 2020-04-14 DIAGNOSIS — Z09 Encounter for follow-up examination after completed treatment for conditions other than malignant neoplasm: Secondary | ICD-10-CM | POA: Diagnosis not present

## 2020-04-14 DIAGNOSIS — Z8669 Personal history of other diseases of the nervous system and sense organs: Secondary | ICD-10-CM | POA: Diagnosis not present

## 2020-04-15 ENCOUNTER — Ambulatory Visit: Payer: PPO | Admitting: Internal Medicine

## 2020-04-28 DIAGNOSIS — G4733 Obstructive sleep apnea (adult) (pediatric): Secondary | ICD-10-CM | POA: Diagnosis not present

## 2020-04-28 DIAGNOSIS — D751 Secondary polycythemia: Secondary | ICD-10-CM | POA: Diagnosis not present

## 2020-04-28 DIAGNOSIS — G4736 Sleep related hypoventilation in conditions classified elsewhere: Secondary | ICD-10-CM | POA: Diagnosis not present

## 2020-04-30 ENCOUNTER — Other Ambulatory Visit: Payer: Self-pay | Admitting: Internal Medicine

## 2020-04-30 DIAGNOSIS — R42 Dizziness and giddiness: Secondary | ICD-10-CM

## 2020-05-05 ENCOUNTER — Other Ambulatory Visit: Payer: Self-pay | Admitting: Internal Medicine

## 2020-05-05 DIAGNOSIS — R42 Dizziness and giddiness: Secondary | ICD-10-CM

## 2020-05-05 NOTE — Addendum Note (Signed)
Addended by: Lisabeth Pick on: 05/05/2020 12:23 PM   Modules accepted: Orders

## 2020-05-05 NOTE — Telephone Encounter (Unsigned)
Copied from CRM [id number]. Topic: Quick Communication - Rx Refill/Question >> May 05, 2020  3:19 PM Gaetana Michaelis A wrote: Medication: meclizine (ANTIVERT) 25 MG   Has the patient contacted their pharmacy? No. Patient has not been in contact with pharmacy   Preferred Pharmacy (with phone number or street name): [id number] [place], [place] - [address] Phone: [phone]  Agent: Please be advised that RX refills may take up to 3 business days. We ask that you follow-up with your pharmacy.

## 2020-05-05 NOTE — Telephone Encounter (Signed)
   Notes to clinic: Patient was last seen on 04/05/2020 by Dr. Dorris Fetch and would like to have another provider fill since he is not there anymore    Requested Prescriptions  Pending Prescriptions Disp Refills   meclizine (ANTIVERT) 25 MG tablet 30 tablet 1    Sig: TAKE 1/2 TO 1 TABLET BY MOUTH UP TO TWICE DAILY AS NEEDED FOR DIZZINESS, USE SPARINGLY      There is no refill protocol information for this order     Refused Prescriptions Disp Refills   meclizine (ANTIVERT) 25 MG tablet [Pharmacy Med Name: MECLIZINE 25 MG TABLET] 30 tablet 1    Sig: TAKE 1/2 TO 1 TABLET BY MOUTH UP TO TWICE DAILY AS NEEDED FOR DIZZINESS, USE SPARINGLY      Not Delegated - Gastroenterology: Antiemetics Failed - 04/30/2020  5:03 PM      Failed - This refill cannot be delegated      Passed - Valid encounter within last 6 months    Recent Outpatient Visits           1 month ago Erythrocytosis   [facility] Jamelle Haring, MD   3 months ago Dizzy spells   [facility] Welford Roche D, MD   4 months ago Benign localized prostatic hyperplasia with lower urinary tract symptoms (LUTS)   [facility] Jamelle Haring, MD   5 months ago Bradycardia   [facility] Jamelle Haring, MD   5 months ago Mixed hyperlipidemia   [facility] Jamelle Haring, MD       Future Appointments             In 6 months  [facility]   In 6 months [name], [facility]ana [facility]

## 2020-05-05 NOTE — Telephone Encounter (Signed)
Requested medication (s) are due for refill today: Yes  Requested medication (s) are on the active medication list: Yes  Last refill:  04/04/20  Future visit scheduled: No  Notes to clinic:  Diagnosis Code needed. Last refilled by Dr. Dorris Fetch, sent to another PCP for approval     Requested Prescriptions  Pending Prescriptions Disp Refills   meclizine (ANTIVERT) 25 MG tablet 30 tablet 1    Sig: TAKE 1/2 TO 1 TABLET BY MOUTH UP TO TWICE DAILY AS NEEDED FOR DIZZINESS, USE SPARINGLY      Not Delegated - Gastroenterology: Antiemetics Failed - 05/05/2020  3:32 PM      Failed - This refill cannot be delegated      Passed - Valid encounter within last 6 months    Recent Outpatient Visits           1 month ago Erythrocytosis   [facility] Jamelle Haring, MD   3 months ago Dizzy spells   [facility] Welford Roche D, MD   4 months ago Benign localized prostatic hyperplasia with lower urinary tract symptoms (LUTS)   [facility] Jamelle Haring, MD   5 months ago Bradycardia   [facility] Jamelle Haring, MD   5 months ago Mixed hyperlipidemia   [facility] Jamelle Haring, MD       Future Appointments             In 6 months  [facility]   In 6 months [name], Elana [facility]

## 2020-05-05 NOTE — Telephone Encounter (Signed)
Pt called in to follow up on refill request for medication. Pt says that he has been waiting. Pt would like to know if this medication could be sent in today?   Please advise.   [facility] [address] [place] - [address] Phone:  [phone]  Fax:  [phone]

## 2020-05-06 ENCOUNTER — Other Ambulatory Visit: Payer: Self-pay | Admitting: Emergency Medicine

## 2020-05-06 DIAGNOSIS — R42 Dizziness and giddiness: Secondary | ICD-10-CM

## 2020-05-06 MED ORDER — MECLIZINE HCL 25 MG PO TABS: ORAL_TABLET | ORAL | 1 refills | Status: DC

## 2020-05-06 NOTE — Telephone Encounter (Signed)
Copied from CRM [id number]. Topic: Quick Communication - Rx Refill/Question >> May 06, 2020  9:39 AM Jaquita Rector A wrote: Medication: meclizine (ANTIVERT) 25 MG tablet  Per pharmacy patient already used up the refill from Rx written for 04/04/20. Patient is taking 2 tabs daily Please advise he is wanting refills today  Has the patient contacted their pharmacy? Yes.   (Agent: If no, request that the patient contact the pharmacy for the refill.) (Agent: If yes, when and what did the pharmacy advise?)  Preferred Pharmacy (with phone number or street name): [id number] [place], [place] - [address]  Phone:  [phone] Fax:  [phone]     Agent: Please be advised that RX refills may take up to 3 business days. We ask that you follow-up with your pharmacy.

## 2020-05-06 NOTE — Telephone Encounter (Signed)
meclizine (ANTIVERT) 25 MG tablet  Per pharmacy patient already used up the refill from Rx written for 04/04/20. Patient is taking 2 tabs daily Please advise he is wanting refills today. Ph# [phone]

## 2020-05-13 ENCOUNTER — Ambulatory Visit: Payer: PPO | Admitting: Internal Medicine

## 2020-05-23 ENCOUNTER — Ambulatory Visit (INDEPENDENT_AMBULATORY_CARE_PROVIDER_SITE_OTHER): Payer: PPO | Admitting: Family Medicine

## 2020-05-23 ENCOUNTER — Encounter: Payer: Self-pay | Admitting: Family Medicine

## 2020-05-23 ENCOUNTER — Other Ambulatory Visit: Payer: Self-pay

## 2020-05-23 VITALS — BP 140/70 | HR 75 | Temp 98.2°F | Resp 16 | Ht 73.0 in | Wt 240.9 lb

## 2020-05-23 DIAGNOSIS — N1831 Chronic kidney disease, stage 3a: Secondary | ICD-10-CM

## 2020-05-23 DIAGNOSIS — I10 Essential (primary) hypertension: Secondary | ICD-10-CM

## 2020-05-23 DIAGNOSIS — R7303 Prediabetes: Secondary | ICD-10-CM

## 2020-05-23 DIAGNOSIS — Z6830 Body mass index (BMI) 30.0-30.9, adult: Secondary | ICD-10-CM

## 2020-05-23 DIAGNOSIS — D751 Secondary polycythemia: Secondary | ICD-10-CM | POA: Diagnosis not present

## 2020-05-23 DIAGNOSIS — E6609 Other obesity due to excess calories: Secondary | ICD-10-CM

## 2020-05-23 DIAGNOSIS — F3341 Major depressive disorder, recurrent, in partial remission: Secondary | ICD-10-CM

## 2020-05-23 DIAGNOSIS — G4733 Obstructive sleep apnea (adult) (pediatric): Secondary | ICD-10-CM

## 2020-05-23 NOTE — Assessment & Plan Note (Signed)
At goal for age with no meds, though with CKD, consider addition of ACEI/ARB for renal protection. Will obtain labs.

## 2020-05-23 NOTE — Progress Notes (Signed)
SUBJECTIVE:   CHIEF COMPLAINT / HPI:   Patient Active Problem List   Diagnosis Date Noted  . Obstructive sleep apnea syndrome 11/12/2019  . Anxiety with depression 07/13/2019  . Benign localized prostatic hyperplasia with lower urinary tract symptoms (LUTS) 07/13/2019  . Recurrent major depressive disorder, in partial remission (HCC) 10/08/2018  . Erythrocytosis 01/31/2018  . Hx of completed stroke 01/10/2018  . Prediabetes 10/07/2017  . Dizziness 07/03/2017  . Abnormal gait 10/05/2016  . Hypoxia, sleep related 04/20/2016  . Elevated hematocrit 04/04/2016  . Right flank pain 01/10/2016  . General unsteadiness 05/24/2015  . Allergic rhinitis due to pollen 11/12/2014  . Back pain, chronic 10/21/2014  . Benign paroxysmal positional nystagmus 10/21/2014  . Decreased libido 10/21/2014  . Fatigue 10/21/2014  . Gastro-esophageal reflux disease without esophagitis 10/21/2014  . Dysmetabolic syndrome 10/21/2014  . Fungal infection of nail 10/21/2014  . HLD (hyperlipidemia) 08/19/2014  . CKD (chronic kidney disease) stage 3, GFR 30-59 ml/min (HCC) 08/19/2014  . Class 1 obesity due to excess calories with serious comorbidity and body mass index (BMI) of 30.0 to 30.9 in adult   . Asymptomatic PVCs 05/27/2014  . Bradycardia 04/05/2014  . Essential hypertension 04/05/2014  . Osteoarthrosis involving more than one site but not generalized 08/03/2008   Hypertension/OSA: - Medications: none - Compliance: n/a - Checking BP at home: no - Denies any SOB, CP, vision changes, LE edema, medication SEs, or symptoms of hypotension - compliant with CPAP only about 2-3 hours per night.  Prediabetes - Last A1c 5.1 04/2018 - Medications: none - Compliance: n/a - Checking BG at home: n/a - Denies symptoms of hypoglycemia, polyuria, polydipsia, foot ulcers/trauma  CHRONIC KIDNEY DISEASE CKD status: stable Medications renally dose: yes Pneumovax:  Up to Date Influenza Vaccine:  Up to  Date  Erythrocytosis - last Hb 17.1 02/2020 - on plavix for h/o stroke. - previously evaluated by heme/onc, as long as asymptomatic, no phlebotomy.  Anxiety - Medications: buspar 10mg  BID - Taking: good comliance - Counseling: no - Symptoms: none currently, doing well since increase(previously with excessive worrying) - Current stressors: none - Coping Mechanisms: none  Depression screen [facility] 2/9 05/23/2020 02/03/2020 01/05/2020  Decreased Interest 0 0 0  Down, Depressed, Hopeless 0 0 0  PHQ - 2 Score 0 0 0  Altered sleeping 0 0 -  Tired, decreased energy 0 0 -  Change in appetite 0 0 -  Feeling bad or failure about yourself  0 0 -  Trouble concentrating 0 0 -  Moving slowly or fidgety/restless - 0 -  Suicidal thoughts 0 0 -  PHQ-9 Score 0 0 -  Difficult doing work/chores - - -  Some recent data might be hidden     OBJECTIVE:   BP 140/70   Pulse 75   Temp 98.2 F (36.8 C) (Oral)   Resp 16   Ht 6\' 1"  (1.854 m)   Wt 240 lb 14.4 oz (109.3 kg)   SpO2 95%   BMI 31.78 kg/m   Gen: elderly, well appearing, in NAD Card: RRR Lungs: CTAB Ext: WWP, no edema  ASSESSMENT/PLAN:   Essential hypertension At goal for age with no meds, though with CKD, consider addition of ACEI/ARB for renal protection. Will obtain labs.   Obstructive sleep apnea syndrome Recommend compliance with CPAP. Offered repeat sleep study to see if still needs but patient declines currently.  CKD (chronic kidney disease) stage 3, GFR 30-59 ml/min Recheck labs today, if worsened, consider  addition of low dose ACEI/ARB for renal protection.  Prediabetes Recheck a1c today.  Erythrocytosis Continue to follow with heme, per last note only phlebotomize if symptomatic.  Recurrent major depressive disorder, in partial remission (HCC) Doing well on current regimen, no changes made today.    F/u in 6 months.   Caro Laroche, DO

## 2020-05-23 NOTE — Assessment & Plan Note (Signed)
Continue to follow with heme, per last note only phlebotomize if symptomatic.

## 2020-05-23 NOTE — Patient Instructions (Addendum)
It was great to see you!  Our plans for today:  - No changes to your medications today. Get a pill box to help organize your medications (see below).    - Make sure you wear your CPAP (breathing machine) at night.   We are checking some labs today, we will call you with these results.   Take care and seek immediate care sooner if you develop any concerns.   Dr. Linwood Dibbles  Things to do to keep yourself healthy  - Exercise at least 30-45 minutes a day, 3-4 days a week.  - Eat a low-fat diet with lots of fruits and vegetables, up to 7-9 servings per day.  - Seatbelts can save your life. Wear them always.  - Smoke detectors on every level of your home, check batteries every year.  - Eye Doctor - have an eye exam every 1-2 years  - Safe sex - if you may be exposed to STDs, use a condom.  - Alcohol -  If you drink, do it moderately, less than 2 drinks per day.  - Health Care Power of Attorney. Choose someone to speak for you if you are not able. https://www.prepareforyourcare.org is a great website to help you navigate this. - Depression is common in our stressful world.If you're feeling down or losing interest in things you normally enjoy, please come in for a visit.  - Violence - If anyone is threatening or hurting you, please call immediately.

## 2020-05-23 NOTE — Assessment & Plan Note (Signed)
Recheck a1c today. 

## 2020-05-23 NOTE — Assessment & Plan Note (Signed)
Doing well on current regimen, no changes made today. 

## 2020-05-23 NOTE — Assessment & Plan Note (Addendum)
Recommend compliance with CPAP. Offered repeat sleep study to see if still needs but patient declines currently.

## 2020-05-23 NOTE — Assessment & Plan Note (Signed)
Recheck labs today, if worsened, consider addition of low dose ACEI/ARB for renal protection.

## 2020-05-24 LAB — BASIC METABOLIC PANEL
BUN/Creatinine Ratio: 12 (calc) (ref 6–22)
BUN: 14 mg/dL (ref 7–25)
CO2: 27 mmol/L (ref 20–32)
Calcium: 9.1 mg/dL (ref 8.6–10.3)
Chloride: 106 mmol/L (ref 98–110)
Creat: 1.18 mg/dL — ABNORMAL HIGH (ref 0.70–1.11)
Glucose, Bld: 88 mg/dL (ref 65–99)
Potassium: 4.1 mmol/L (ref 3.5–5.3)
Sodium: 139 mmol/L (ref 135–146)

## 2020-05-24 LAB — HEMOGLOBIN A1C
Hgb A1c MFr Bld: 5 % of total Hgb (ref <5.7)
Mean Plasma Glucose: 97 mg/dL
eAG (mmol/L): 5.4 mmol/L

## 2020-05-24 LAB — VITAMIN B12: Vitamin B-12: >2000 pg/mL — ABNORMAL HIGH (ref 200–1100)

## 2020-05-29 DIAGNOSIS — D751 Secondary polycythemia: Secondary | ICD-10-CM | POA: Diagnosis not present

## 2020-05-29 DIAGNOSIS — G4733 Obstructive sleep apnea (adult) (pediatric): Secondary | ICD-10-CM | POA: Diagnosis not present

## 2020-05-29 DIAGNOSIS — G4736 Sleep related hypoventilation in conditions classified elsewhere: Secondary | ICD-10-CM | POA: Diagnosis not present

## 2020-05-30 DIAGNOSIS — E1122 Type 2 diabetes mellitus with diabetic chronic kidney disease: Secondary | ICD-10-CM | POA: Diagnosis not present

## 2020-05-30 DIAGNOSIS — Z8719 Personal history of other diseases of the digestive system: Secondary | ICD-10-CM | POA: Diagnosis not present

## 2020-05-30 DIAGNOSIS — J449 Chronic obstructive pulmonary disease, unspecified: Secondary | ICD-10-CM | POA: Diagnosis not present

## 2020-05-30 DIAGNOSIS — N183 Chronic kidney disease, stage 3 unspecified: Secondary | ICD-10-CM | POA: Diagnosis not present

## 2020-05-30 DIAGNOSIS — E1151 Type 2 diabetes mellitus with diabetic peripheral angiopathy without gangrene: Secondary | ICD-10-CM | POA: Diagnosis not present

## 2020-05-31 DEATH — deceased

## 2020-06-13 ENCOUNTER — Other Ambulatory Visit: Payer: Self-pay

## 2020-06-13 ENCOUNTER — Other Ambulatory Visit: Payer: Self-pay | Admitting: Family Medicine

## 2020-06-13 DIAGNOSIS — R42 Dizziness and giddiness: Secondary | ICD-10-CM

## 2020-06-13 MED ORDER — MECLIZINE HCL 25 MG PO TABS
ORAL_TABLET | ORAL | 0 refills | Status: DC
Start: 2020-06-13 — End: 2020-06-29

## 2020-06-13 NOTE — Telephone Encounter (Signed)
Pt is also requesting diltiazem (CARDIZEM CD) 120 MG 24 hr capsule  States he is almost completely out,

## 2020-06-16 ENCOUNTER — Other Ambulatory Visit: Payer: Self-pay | Admitting: Internal Medicine

## 2020-06-16 ENCOUNTER — Other Ambulatory Visit: Payer: Self-pay | Admitting: Family Medicine

## 2020-06-16 DIAGNOSIS — J310 Chronic rhinitis: Secondary | ICD-10-CM

## 2020-06-16 MED ORDER — DILTIAZEM HCL ER COATED BEADS 120 MG PO CP24: ORAL_CAPSULE | ORAL | 1 refills | Status: DC

## 2020-06-16 NOTE — Telephone Encounter (Signed)
Medication Refill - Medication: diltiazem (CARDIZEM CD) 120 MG 24 hr capsule   Patient is all out of the medication   Preferred Pharmacy (with phone number or street name):  [id number] [place], [place] - [address] Phone:  [phone]  Fax:  [phone]       Agent: Please be advised that RX refills may take up to 3 business days. We ask that you follow-up with your pharmacy.

## 2020-06-16 NOTE — Telephone Encounter (Signed)
Requested Prescriptions  Pending Prescriptions Disp Refills  . diltiazem (CARDIZEM CD) 120 MG 24 hr capsule 90 capsule 1    Sig: TAKE 1 CAPSULE BY MOUTH EVERY DAY     Cardiovascular:  Calcium Channel Blockers Failed - 06/16/2020 11:08 AM      Failed - Last BP in normal range    BP Readings from Last 1 Encounters:  05/23/20 140/70         Passed - Valid encounter within last 6 months    Recent Outpatient Visits          3 weeks ago Essential hypertension   [facility] Caro Lar[facility], [facility]   2 months ago Erythrocytosis   [facility] Jamelle Haring, [facility]   4 months ago Dizzy spells   [facility] Welford Roche D, MD   5 months ago Benign localized prostatic hyperplasia with lower urinary tract symptoms (LUTS)   [facility] Jam[facility] H[facility], [facility]   6 months ago Bradycardia   [facility] Jam[facility] Haring, MD      Future Appointments            In 4 months  [facility]   In 5 months [name], Elana Alm [facility]

## 2020-06-20 DIAGNOSIS — Z8719 Personal history of other diseases of the digestive system: Secondary | ICD-10-CM | POA: Diagnosis not present

## 2020-06-20 DIAGNOSIS — Z09 Encounter for follow-up examination after completed treatment for conditions other than malignant neoplasm: Secondary | ICD-10-CM | POA: Diagnosis not present

## 2020-06-20 DIAGNOSIS — Z8669 Personal history of other diseases of the nervous system and sense organs: Secondary | ICD-10-CM | POA: Diagnosis not present

## 2020-06-23 DIAGNOSIS — Z09 Encounter for follow-up examination after completed treatment for conditions other than malignant neoplasm: Secondary | ICD-10-CM | POA: Diagnosis not present

## 2020-06-23 DIAGNOSIS — Z8719 Personal history of other diseases of the digestive system: Secondary | ICD-10-CM | POA: Diagnosis not present

## 2020-06-27 ENCOUNTER — Other Ambulatory Visit: Payer: Self-pay | Admitting: Family Medicine

## 2020-06-27 NOTE — Telephone Encounter (Signed)
Pt called and asked for a refill for clopidogrel (PLAVIX) 75 MG tablet / I advised that it can take up to 72 hrs for refill request and he asked if this can be sent today/ please advise

## 2020-06-27 NOTE — Telephone Encounter (Signed)
Pt called again regarding this medication refill. He is requesting to speak with Nurse.

## 2020-06-29 ENCOUNTER — Other Ambulatory Visit: Payer: Self-pay | Admitting: Family Medicine

## 2020-06-29 DIAGNOSIS — R42 Dizziness and giddiness: Secondary | ICD-10-CM

## 2020-06-29 NOTE — Telephone Encounter (Signed)
Pt called stating that he will be taking his last pill tomorrow morning. Please advise.

## 2020-06-29 NOTE — Telephone Encounter (Signed)
Requested medication (s) are due for refill today:  yes  Requested medication (s) are on the active medication list: yes  Last refill:  06/13/2020  Future visit scheduled: yes  Notes to clinic: this refill cannot be delegated    Requested Prescriptions  Pending Prescriptions Disp Refills   meclizine (ANTIVERT) 25 MG tablet [Pharmacy Med Name: MECLIZINE 25 MG TABLET] 30 tablet 0    Sig: TAKE 1/2 TO 1 TABLET BY MOUTH UP TO DAILY AS NEEDED FOR DIZZINESS, USE SPARINGLY.      Not Delegated - Gastroenterology: Antiemetics Failed - 06/29/2020  9:23 AM      Failed - This refill cannot be delegated      Passed - Valid encounter within last 6 months    Recent Outpatient Visits           1 month ago Essential hypertension   [facility], DO   2 months ago Erythrocytosis   [facility] Jam[facility] Haring, MD   4 months ago Dizzy spells   [facility] Welford Roche D, MD   5 months ago Benign localized prostatic hyperplasia with lower urinary tract symptoms (LUTS)   [facility] Jamelle Haring, MD   6 months ago Bradycardia   [facility] Jam[facility] H[facility], MD       Future Appointments             In 4 months  [facility]   In 4 months [name], Elana Alm [facility]

## 2020-06-30 ENCOUNTER — Other Ambulatory Visit: Payer: Self-pay | Admitting: Urology

## 2020-06-30 NOTE — Telephone Encounter (Signed)
Pt states he take 2 meclizine (ANTIVERT) 25 MG tablet  A day  Pt does not want to pick up/pay for, rx that is not enough pills. Please advise

## 2020-08-02 ENCOUNTER — Other Ambulatory Visit: Payer: Self-pay | Admitting: Family Medicine

## 2020-08-02 DIAGNOSIS — R42 Dizziness and giddiness: Secondary | ICD-10-CM

## 2020-08-02 DIAGNOSIS — F419 Anxiety disorder, unspecified: Secondary | ICD-10-CM

## 2020-08-02 MED ORDER — BUSPIRONE HCL 10 MG PO TABS: ORAL_TABLET | ORAL | 0 refills | Status: DC

## 2020-08-02 NOTE — Telephone Encounter (Signed)
Requested medication (s) are due for refill today: yes  Requested medication (s) are on the active medication list: yes  Last refill: 07/01/2020  Future visit scheduled:no  Notes to clinic:  this refill cannot be delegated   Pt has 3 tabs left  And stated he needs Rx asap   Requested Prescriptions  Pending Prescriptions Disp Refills   meclizine (ANTIVERT) 25 MG tablet [Pharmacy Med Name: MECLIZINE 25 MG TABLET] 30 tablet 0    Sig: TAKE 1/2 TO 1 TABLET BY MOUTH UP TO DAILY AS NEEDED FOR DIZZINESS, USE SPARINGLY.      Not Delegated - Gastroenterology: Antiemetics Failed - 08/02/2020  9:45 AM      Failed - This refill cannot be delegated      Passed - Valid encounter within last 6 months    Recent Outpatient Visits           2 months ago Essential hypertension   [facility], DO   4 months ago Erythrocytosis   [facility] Jam[facility] H[facility], MD   6 months ago Dizzy spells   [facility] Welford Roche D, MD   7 months ago Benign localized prostatic hyperplasia with lower urinary tract symptoms (LUTS)   [facility] H[facility], [facility]   8 months ago Bradycardia   [facility] Jamelle Haring, MD       Future Appointments             In 3 months  [facility]   In 3 months [name], Elana Alm [facility]

## 2020-08-02 NOTE — Telephone Encounter (Signed)
Last seen: 2.21.2022 Next scheduled: nothing scheduled

## 2020-08-02 NOTE — Telephone Encounter (Signed)
Medication Refill - Medication: busPIRone (BUSPAR) 10 MG tablet  Pt has 3 tabs left  And stated he needs Rx asap   Has the patient contacted their pharmacy? Yes.   (Agent: If no, request that the patient contact the pharmacy for the refill.) (Agent: If yes, when and what did the pharmacy advise?)  Preferred Pharmacy (with phone number or street name):  [facility] [address] [place], [place] - [address]  [address] [place], [place] [address]  Phone:  [phone] Fax:  [phone]  Agent: Please be advised that RX refills may take up to 3 business days. We ask that you follow-up with your pharmacy.

## 2020-08-15 ENCOUNTER — Other Ambulatory Visit: Payer: Self-pay | Admitting: Family Medicine

## 2020-08-15 DIAGNOSIS — R42 Dizziness and giddiness: Secondary | ICD-10-CM

## 2020-08-17 ENCOUNTER — Other Ambulatory Visit: Payer: Self-pay | Admitting: Family Medicine

## 2020-08-17 DIAGNOSIS — R42 Dizziness and giddiness: Secondary | ICD-10-CM

## 2020-08-17 NOTE — Telephone Encounter (Signed)
Requested medication (s) are due for refill today: Yes  Requested medication (s) are on the active medication list: Yes  Last refill:  06/29/20  Future visit scheduled: No  Notes to clinic:  See request.    Requested Prescriptions  Pending Prescriptions Disp Refills   meclizine (ANTIVERT) 25 MG tablet 30 tablet 0      Not Delegated - Gastroenterology: Antiemetics Failed - 08/17/2020  8:32 AM      Failed - This refill cannot be delegated      Passed - Valid encounter within last 6 months    Recent Outpatient Visits           2 months ago Essential hypertension   [facility] Lar[facility], DO   4 months ago Erythrocytosis   [facility] Jam[facility] Haring, MD   6 months ago Dizzy spells   [facility] Welford Roche D, MD   7 months ago Benign localized prostatic hyperplasia with lower urinary tract symptoms (LUTS)   [facility] Jam[facility] H[facility], MD   8 months ago Bradycardia   [facility] Jamelle Haring, MD       Future Appointments             In 2 months  [facility]   In 3 months [name], [facility]

## 2020-08-17 NOTE — Telephone Encounter (Signed)
Medication Refill - Medication: meclizine (ANTIVERT) 25 MG tablet Pt states he has 2 pills left   Has the patient contacted their pharmacy? No. (Agent: If no, request that the patient contact the pharmacy for the refill.) (Agent: If yes, when and what did the pharmacy advise?)  Preferred Pharmacy (with phone number or street name): [facility] [address] [place], [place] - [address]  [address] [place], [place] [address]  Phone:  [phone] Fax:  [phone]   Agent: Please be advised that RX refills may take up to 3 business days. We ask that you follow-up with your pharmacy.

## 2020-08-18 ENCOUNTER — Telehealth: Payer: Self-pay | Admitting: Family Medicine

## 2020-08-18 ENCOUNTER — Other Ambulatory Visit: Payer: Self-pay | Admitting: Family Medicine

## 2020-08-18 DIAGNOSIS — M3501 Sicca syndrome with keratoconjunctivitis: Secondary | ICD-10-CM | POA: Diagnosis not present

## 2020-08-18 DIAGNOSIS — F419 Anxiety disorder, unspecified: Secondary | ICD-10-CM

## 2020-08-18 DIAGNOSIS — R42 Dizziness and giddiness: Secondary | ICD-10-CM

## 2020-08-18 NOTE — Telephone Encounter (Signed)
Last seen 2.21.2022 no upcoming appts

## 2020-08-18 NOTE — Telephone Encounter (Signed)
Requested medication (s) are due for refill today: Yes  Requested medication (s) are on the active medication list: Yes  Last refill:  06/29/20  Future visit scheduled: Yes  Notes to clinic:  See request.    Requested Prescriptions  Pending Prescriptions Disp Refills   meclizine (ANTIVERT) 25 MG tablet [Pharmacy Med Name: MECLIZINE 25 MG TABLET] 30 tablet 0    Sig: TAKE 1/2 TO 1 TABLET BY MOUTH UP TO DAILY AS NEEDED FOR DIZZINESS, USE SPARINGLY.      Not Delegated - Gastroenterology: Antiemetics Failed - 08/18/2020 11:26 AM      Failed - This refill cannot be delegated      Passed - Valid encounter within last 6 months    Recent Outpatient Visits           2 months ago Essential hypertension   [facility], DO   4 months ago Erythrocytosis   [facility] Jam[facility] H[facility], MD   6 months ago Dizzy spells   [facility] Welford Roche D, MD   7 months ago Benign localized prostatic hyperplasia with lower urinary tract symptoms (LUTS)   [facility] H[facility] MD   8 months ago Bradycardia   [facility] Jamelle Haring, MD       Future Appointments             In 2 months  [facility]   In 3 months [name], [facility]

## 2020-08-19 NOTE — Telephone Encounter (Signed)
Please close chart

## 2020-08-22 NOTE — Telephone Encounter (Signed)
MBS 

## 2020-08-22 NOTE — Telephone Encounter (Signed)
Pt is calling back requesting a nurse CB as he states he has always taken this med and does not need an appt, states needs another refill too and that he wants to let the nurse know that he may have to change dr if they will not give meds w/o  appt. Pt again refuses appt. Want fu call at [phone]

## 2020-08-22 NOTE — Telephone Encounter (Signed)
Pt informed script has been sent to pharmacy 

## 2020-08-23 MED ORDER — MECLIZINE HCL 25 MG PO TABS: ORAL_TABLET | ORAL | 0 refills | Status: DC

## 2020-08-24 NOTE — Telephone Encounter (Signed)
Patient notified

## 2020-09-07 ENCOUNTER — Ambulatory Visit: Payer: Self-pay | Admitting: *Deleted

## 2020-09-07 NOTE — Telephone Encounter (Signed)
No triage needed/completed. Patient with question regarding taking tylenol/ibuprofen for dental work.  He can take either but not together. Neither will interfere with your regular medications.

## 2020-09-07 NOTE — Telephone Encounter (Signed)
  Reason for Disposition . Caller has medicine question only, adult not sick, AND triager answers question  Answer Assessment - Initial Assessment Questions 1. NAME of MEDICATION: "What medicine are you calling about?"     Ibuprofen and tylenol 2. QUESTION: "What is your question?" (e.g., double dose of medicine, side effect)     Can he take either for discomfort from dental work.  3. PRESCRIBING HCP: "Who prescribed it?" Reason: if prescribed by specialist, call should be referred to that group.     dentist 4. SYMPTOMS: "Do you have any symptoms?"     na 5. SEVERITY: If symptoms are present, ask "Are they mild, moderate or severe?"     na 6. PREGNANCY:  "Is there any chance that you are pregnant?" "When was your last menstrual period?"     na  Protocols used: MEDICATION QUESTION CALL-A-AH

## 2020-09-15 ENCOUNTER — Ambulatory Visit: Payer: Self-pay | Admitting: *Deleted

## 2020-09-15 ENCOUNTER — Telehealth: Payer: Self-pay | Admitting: Family Medicine

## 2020-09-15 DIAGNOSIS — R42 Dizziness and giddiness: Secondary | ICD-10-CM

## 2020-09-15 NOTE — Telephone Encounter (Signed)
Pt called in to make provider aware that he doesn't need refill for this mediation. Pt says that he has enough. Pt will call In when Rx is needed.

## 2020-09-15 NOTE — Telephone Encounter (Signed)
Reason for Disposition  Caller has medicine question, adult has minor symptoms, caller declines triage, AND triager answers question    Cardizem- wanted to know what it was for and needed a refill.   Instructed him to call his pharmacy for the refill.  Answer Assessment - Initial Assessment Questions 1. NAME of MEDICATION: "What medicine are you calling about?"     Cardizem 2. QUESTION: "What is your question?" (e.g., double dose of medicine, side effect)     What is this medicine for? 3. PRESCRIBING HCP: "Who prescribed it?" Reason: if prescribed by specialist, call should be referred to that group.     It was Dr. Dorris Fetch but Danelle Berry now per chart 4. SYMPTOMS: "Do you have any symptoms?"     "I just didn't know what it was for"   "I've been on it".    "I need a refill".   I let him know to call his pharmacy for the refill.   He said he would and thanked me for my help. 5. SEVERITY: If symptoms are present, ask "Are they mild, moderate or severe?"     N/A 6. PREGNANCY:  "Is there any chance that you are pregnant?" "When was your last menstrual period?"     N/A  Protocols used: Medication Question Call-A-AH

## 2020-09-22 ENCOUNTER — Other Ambulatory Visit: Payer: Self-pay

## 2020-09-22 ENCOUNTER — Other Ambulatory Visit: Payer: Self-pay | Admitting: Family Medicine

## 2020-09-22 DIAGNOSIS — E782 Mixed hyperlipidemia: Secondary | ICD-10-CM

## 2020-09-22 NOTE — Telephone Encounter (Signed)
Pt saw Dr Linwood Dibbles in feb 2022. Wellness is scheduled for 11/01/20

## 2020-09-22 NOTE — Telephone Encounter (Unsigned)
Copied from CRM [id number]. Topic: Quick Communication - Rx Refill/Question >> Sep 22, 2020  2:24 PM Gaetana Michaelis A wrote: Medication: atorvastatin (LIPITOR) 20 MG tablet   Has the patient contacted their pharmacy? No. (Agent: If no, request that the patient contact the pharmacy for the refill.) (Agent: If yes, when and what did the pharmacy advise?)  Preferred Pharmacy (with phone number or street name): [id number] [place], [place] - [address]  Phone:  [phone] Fax:  [phone]  Agent: Please be advised that RX refills may take up to 3 business days. We ask that you follow-up with your pharmacy.

## 2020-09-22 NOTE — Telephone Encounter (Signed)
   Notes to clinic:  medication last filled by Dr.Hendrickson  Review for refill    Requested Prescriptions  Pending Prescriptions Disp Refills   atorvastatin (LIPITOR) 20 MG tablet 90 tablet 3    Sig: TAKE 1 TABLET BY MOUTH EVERYDAY AT BEDTIME      Cardiovascular:  Antilipid - Statins Passed - 09/22/2020  2:33 PM      Passed - Total Cholesterol in normal range and within 360 days    Cholesterol, Total  Date Value Ref Range Status  09/22/2014 134 100 - 199 mg/dL Final   Cholesterol  Date Value Ref Range Status  11/12/2019 126 <200 mg/dL Final          Passed - LDL in normal range and within 360 days    LDL Cholesterol (Calc)  Date Value Ref Range Status  11/12/2019 64 mg/dL (calc) Final    Comment:    Reference range: <100 . Desirable range <100 mg/dL for primary prevention;   <70 mg/dL for patients with CHD or diabetic patients  with > or = 2 CHD risk factors. [name] LDL-C is now calculated using the Curington-Hopkins  calculation, which is a validated novel method providing  better accuracy than the Friedewald equation in the  estimation of LDL-C.  Horald Pollen et al. Lenox Ahr. 0865;784(69): 2061-2068  (http://education.[employer].com/faq/FAQ164)           Passed - HDL in normal range and within 360 days    HDL  Date Value Ref Range Status  11/12/2019 45 > OR = 40 mg/dL Final  62/95/2841 46 >32 mg/dL Final    Comment:    According to ATP-III Guidelines, HDL-C >59 mg/dL is considered a negative risk factor for CHD.           Passed - Triglycerides in normal range and within 360 days    Triglycerides  Date Value Ref Range Status  11/12/2019 87 <150 mg/dL Final          Passed - Patient is not pregnant      Passed - Valid encounter within last 12 months    Recent Outpatient Visits           4 months ago Essential hypertension   [facility]   5 months ago Erythrocytosis   [facility]   7 months ago Dizzy spells   [facility] Welford Roche D, MD   8 months ago Benign localized prostatic hyperplasia with lower urinary tract symptoms (LUTS)   [facility] Jamelle Haring, MD   9 months ago Bradycardia   [facility] Jam[facility] Haring, MD       Future Appointments             In 1 month  [facility]   In 2 months [name], Elana Alm [facility]

## 2020-09-23 ENCOUNTER — Other Ambulatory Visit: Payer: Self-pay

## 2020-09-23 DIAGNOSIS — R42 Dizziness and giddiness: Secondary | ICD-10-CM

## 2020-09-23 DIAGNOSIS — E782 Mixed hyperlipidemia: Secondary | ICD-10-CM

## 2020-09-23 MED ORDER — ATORVASTATIN CALCIUM 20 MG PO TABS: ORAL_TABLET | ORAL | 3 refills | Status: DC

## 2020-09-26 ENCOUNTER — Other Ambulatory Visit: Payer: Self-pay | Admitting: Family Medicine

## 2020-09-26 DIAGNOSIS — R42 Dizziness and giddiness: Secondary | ICD-10-CM

## 2020-09-26 NOTE — Telephone Encounter (Signed)
Last seen on 05-23-2020 and next appt 11-01-2020 with Garald Balding

## 2020-09-26 NOTE — Telephone Encounter (Signed)
Medication Refill - Medication:   meclizine (ANTIVERT) 25 MG tablet  Has the patient contacted their pharmacy? Yes.   No refills left.    Preferred Pharmacy (with phone number or street name):   [facility] [address], [place] - [address]  [address] [place], [place] [address]  Phone:  [phone]  Fax:  [phone]   Agent: Please be advised that RX refills may take up to 3 business days. We ask that you follow-up with your pharmacy.

## 2020-09-26 NOTE — Telephone Encounter (Signed)
  Notes to clinic:  Patient is requesting refill Looks like medication was denied of 09/15/2020   Requested Prescriptions  Pending Prescriptions Disp Refills   meclizine (ANTIVERT) 25 MG tablet 30 tablet 0    Sig: TAKE 1/2 TO 1 TABLET BY MOUTH UP TO DAILY AS NEEDED FOR DIZZINESS, USE SPARINGLY.      Not Delegated - Gastroenterology: Antiemetics Failed - 09/26/2020  2:09 PM      Failed - This refill cannot be delegated      Passed - Valid encounter within last 6 months    Recent Outpatient Visits           4 months ago Essential hypertension   [facility] Caro Laroche, DO   5 months ago Erythrocytosis   [facility] Jam[facility] H[facility], MD   7 months ago Dizzy spells   [facility] Welford Roche D, MD   8 months ago Benign localized prostatic hyperplasia with lower urinary tract symptoms (LUTS)   [facility] Jam[facility] H[facility]   9 months ago Bradycardia   [facility] Jam[facility] H[facility], [facility]       Future Appointments             In 1 month  [facility]   In 1 month [name], Elana Alm [facility]

## 2020-09-27 ENCOUNTER — Other Ambulatory Visit: Payer: Self-pay | Admitting: Family Medicine

## 2020-09-27 DIAGNOSIS — R42 Dizziness and giddiness: Secondary | ICD-10-CM

## 2020-09-27 NOTE — Telephone Encounter (Signed)
Copied from CRM [id number]. Topic: Quick Communication - Rx Refill/Question >> Sep 27, 2020  4:33 PM Jeffery Campbell wrote: Medication: meclizine (ANTIVERT) 25 MG tablet   Has the patient contacted their pharmacy? Yes.   (Agent: If no, request that the patient contact the pharmacy for the refill.) (Agent: If yes, when and what did the pharmacy advise?)  Preferred Pharmacy (with phone number or street name): [id number] [place], [place] - [address]  Phone:  [phone] Fax:  [phone]     Agent: Please be advised that RX refills may take up to 3 business days. We ask that you follow-up with your pharmacy.

## 2020-09-28 NOTE — Telephone Encounter (Signed)
Last appt was 05/23/20

## 2020-09-28 NOTE — Telephone Encounter (Signed)
   Notes to clinic: Patient would like to know why medication is not being refilled  Please contact    Requested Prescriptions  Pending Prescriptions Disp Refills   meclizine (ANTIVERT) 25 MG tablet 30 tablet 0    Sig: TAKE 1/2 TO 1 TABLET BY MOUTH UP TO DAILY AS NEEDED FOR DIZZINESS, USE SPARINGLY.      Not Delegated - Gastroenterology: Antiemetics Failed - 09/28/2020  8:16 AM      Failed - This refill cannot be delegated      Passed - Valid encounter within last 6 months    Recent Outpatient Visits           4 months ago Essential hypertension   [facility], DO   5 months ago Erythrocytosis   [facility] H[facility], MD   7 months ago Dizzy spells   [facility] Welford Roche D, MD   8 months ago Benign localized prostatic hyperplasia with lower urinary tract symptoms (LUTS)   [facility] Jam[facility] H[facility], MD   10 months ago Bradycardia   [facility] Jam[facility] H[facility], MD       Future Appointments             In 1 month  [facility]   In 1 month [name], Elana Alm [facility]

## 2020-10-05 ENCOUNTER — Encounter: Payer: Self-pay | Admitting: Family Medicine

## 2020-10-05 ENCOUNTER — Ambulatory Visit (INDEPENDENT_AMBULATORY_CARE_PROVIDER_SITE_OTHER): Payer: PPO | Admitting: Family Medicine

## 2020-10-05 ENCOUNTER — Other Ambulatory Visit: Payer: Self-pay

## 2020-10-05 VITALS — BP 140/86 | HR 72 | Temp 98.4°F | Resp 16 | Ht 73.0 in | Wt 236.5 lb

## 2020-10-05 DIAGNOSIS — I10 Essential (primary) hypertension: Secondary | ICD-10-CM

## 2020-10-05 DIAGNOSIS — H8113 Benign paroxysmal vertigo, bilateral: Secondary | ICD-10-CM | POA: Diagnosis not present

## 2020-10-05 DIAGNOSIS — R7989 Other specified abnormal findings of blood chemistry: Secondary | ICD-10-CM | POA: Insufficient documentation

## 2020-10-05 DIAGNOSIS — D751 Secondary polycythemia: Secondary | ICD-10-CM | POA: Diagnosis not present

## 2020-10-05 DIAGNOSIS — N1831 Chronic kidney disease, stage 3a: Secondary | ICD-10-CM

## 2020-10-05 DIAGNOSIS — R42 Dizziness and giddiness: Secondary | ICD-10-CM | POA: Diagnosis not present

## 2020-10-05 MED ORDER — MECLIZINE HCL 25 MG PO TABS: ORAL_TABLET | ORAL | 0 refills | Status: DC

## 2020-10-05 NOTE — Assessment & Plan Note (Signed)
Recheck labs today. 

## 2020-10-05 NOTE — Progress Notes (Signed)
   SUBJECTIVE:   CHIEF COMPLAINT / HPI:   DIZZINESS - about 1-2x per week will "feel bad." Usually when waking up in the mornings after using the restroom and going back to bed.   - takes meclizine when feeling symptoms with relief. Requesting refill. Has been on for 5-6 years per report. - does not check BP at home Description of symptoms: ill-defined Duration of episode:  30-40 minutes Dizziness frequency: recurrent Provoking factors: none Aggravating factors:  none Triggered by rolling over in bed: no Triggered by bending over: no Aggravated by head movement: no Aggravated by exertion, coughing, loud noises: no Recent head injury: no Recent or current viral symptoms: no History of vasovagal episodes: no Nausea: no Vomiting: no Tinnitus: no Hearing loss: no Aural fullness: no Headache: no Unsteady gait: no Postural instability: no Diplopia, dysarthria, dysphagia or weakness: no Related to exertion: no Pallor: no Diaphoresis: no Dyspnea: no Chest pain: no   Hypertension, OSA: - Medications: diltiazem 120mg  daily (for PVCs) - Compliance: good - Checking BP at home: no - Denies any SOB, CP, vision changes, LE edema, medication SEs  Prediabetes - Last A1c 5.0 - Medications: none - Compliance: n/a - Denies symptoms of hypoglycemia, polyuria, polydipsia, numbness extremities, foot ulcers/trauma  Benign Prostatic Hypertrophy - follows with Urology - Medications: alfuzosin 10mg , finasteride 5mg  - Symptoms: incomplete emptying, nocturia, frequency  Erythrocytosis - last Hb 17.1 02/2020 - on plavix for h/o stroke. - previously evaluated by heme/onc, as long as asymptomatic, no phlebotomy.    OBJECTIVE:   BP 140/86   Pulse 72   Temp 98.4 F (36.9 C) (Oral)   Resp 16   Ht 6\' 1"  (1.854 m)   Wt 236 lb 8 oz (107.3 kg)   SpO2 97%   BMI 31.20 kg/m   Gen: elderly, in NAD Card: RRR Lungs: CTAB MSK: strength 4/5 to U/LE bilaterally, ambulates with cane.  No  edema.  Neuro: Alert and oriented, speech normal.  Optic field normal. Extraocular movements intact.  Tongue protrudes normally with no deviation.  Shoulder shrug, smile symmetric. Finger to nose normal. with reproduction of symptoms though without nystagmus.   ASSESSMENT/PLAN:   Essential hypertension At goal for age, no changes.  Benign paroxysmal positional nystagmus No nystagmus on exam today but with reproduction of symptoms with . previously seen by neuro. Discussed harms of meclizine given age and h/o BPH, patient will take sparingly. Referred to PT for Epley maneuver.   CKD (chronic kidney disease) stage 3, GFR 30-59 ml/min Recheck labs today.  High serum vitamin B12 Recheck labs today.     03/2020, DO

## 2020-10-05 NOTE — Assessment & Plan Note (Signed)
At goal for age, no changes.  

## 2020-10-05 NOTE — Patient Instructions (Signed)
It was great to see you!  Our plans for today:  - Take the meclizine only as needed.  - We are referring you to physical therapy to help with your dizziness.  - Get a blood pressure cuff and check your blood pressure when you are feeling bad and let us know if the top number is less than 100. - Make sure to stay hydrated.  Take care and seek immediate care sooner if you develop any concerns.   Dr. Linwood Dibbles

## 2020-10-05 NOTE — Assessment & Plan Note (Signed)
No nystagmus on exam today but with reproduction of symptoms with Jeffery Campbell. previously seen by neuro. Discussed harms of meclizine given age and h/o BPH, patient will take sparingly. Referred to PT for Epley maneuver.

## 2020-10-06 LAB — BASIC METABOLIC PANEL
BUN: 13 mg/dL (ref 7–25)
CO2: 25 mmol/L (ref 20–32)
Calcium: 9.5 mg/dL (ref 8.6–10.3)
Chloride: 104 mmol/L (ref 98–110)
Creat: 1.06 mg/dL (ref 0.70–1.11)
Glucose, Bld: 94 mg/dL (ref 65–99)
Potassium: 3.9 mmol/L (ref 3.5–5.3)
Sodium: 139 mmol/L (ref 135–146)

## 2020-10-06 LAB — VITAMIN B12: Vitamin B-12: 372 pg/mL (ref 200–1100)

## 2020-11-01 ENCOUNTER — Ambulatory Visit (INDEPENDENT_AMBULATORY_CARE_PROVIDER_SITE_OTHER): Payer: PPO

## 2020-11-01 DIAGNOSIS — Z0000 Encounter for general adult medical examination without abnormal findings: Secondary | ICD-10-CM | POA: Diagnosis not present

## 2020-11-01 NOTE — Progress Notes (Signed)
Subjective:   Jeffery Campbell is a 87 y.o. male who presents for Medicare Annual/Subsequent preventive examination.  Virtual Visit via Telephone Note  I connected with  Jeffery Campbell on 11/01/20 at  2:10 PM EDT by telephone and verified that I am speaking with the correct person using two identifiers.  Location: Patient: home Provider: [facility] Persons participating in the virtual visit: patient/Nurse Health Advisor   I discussed the limitations, risks, security and privacy concerns of performing an evaluation and management service by telephone and the availability of in person appointments. The patient expressed understanding and agreed to proceed.  Interactive audio and video telecommunications were attempted between this nurse and patient, however failed, due to patient having technical difficulties OR patient did not have access to video capability.  We continued and completed visit with audio only.  Some vital signs may be absent or patient reported.   Reather Littler, LPN    Review of Systems     Cardiac Risk Factors include: advanced age (>34men, >40 women);male gender;dyslipidemia;hypertension     Objective:    There were no vitals filed for this visit. There is no height or weight on file to calculate BMI.  Advanced Directives 11/01/2020 02/10/2020 10/29/2019 02/10/2019 10/28/2018 08/13/2018 02/24/2018  Does Patient Have a Medical Advance Directive? Yes Yes No Yes Yes No Yes  Type of Estate agent of [place];Living will Healthcare Power of [place];Living will - Healthcare Power of [place];Living will Healthcare Power of [place];Living will - Living will  Does patient want to make changes to medical advance directive? - - - No - Patient declined - No - Patient declined -  Copy of Healthcare Power of Attorney in Chart? No - copy requested No - copy requested - No - copy requested No - copy requested - -  Would patient like information on creating a medical advance  directive? - No - Patient declined No - Patient declined No - Patient declined - - -    Current Medications (verified) Outpatient Encounter Medications as of 11/01/2020  Medication Sig   acetaminophen (TYLENOL) 325 MG tablet Take 325-650 mg by mouth every 6 (six) hours as needed for moderate pain or headache.   alfuzosin (UROXATRAL) 10 MG 24 hr tablet TAKE 1 TABLET (10 MG TOTAL) BY MOUTH DAILY WITH BREAKFAST.   atorvastatin (LIPITOR) 20 MG tablet TAKE 1 TABLET BY MOUTH EVERYDAY AT BEDTIME   busPIRone (BUSPAR) 10 MG tablet TAKE 1 TABLET BY MOUTH TWICE A DAY AS NEEDED   clopidogrel (PLAVIX) 75 MG tablet Take 1 tablet (75 mg total) by mouth daily. (stop aggrenox)   Cyanocobalamin (B-12) 3000 MCG SUBL Place 1,000 mcg under the tongue daily.   diltiazem (CARDIZEM CD) 120 MG 24 hr capsule TAKE 1 CAPSULE BY MOUTH EVERY DAY   docusate sodium (COLACE) 100 MG capsule Take 100 mg by mouth daily as needed for mild constipation.   finasteride (PROSCAR) 5 MG tablet TAKE 1 TABLET BY MOUTH EVERY DAY   levocetirizine (XYZAL) 5 MG tablet TAKE 1 TABLET BY MOUTH EVERY DAY IN THE EVENING   meclizine (ANTIVERT) 25 MG tablet TAKE 1/2 TO 1 TABLET BY MOUTH UP TO DAILY AS NEEDED FOR DIZZINESS, USE SPARINGLY.   Omega-3 Fatty Acids (FISH OIL) 1200 MG CAPS Take by mouth.   Propylene Glycol (SYSTANE BALANCE) 0.6 % SOLN Place 1 drop into both eyes 3 (three) times daily as needed (dry eyes).   fluticasone (FLONASE) 50 MCG/ACT nasal spray PLACE 2 SPRAYS INTO BOTH NOSTRILS  DAILY AS NEEDED FOR ALLERGIES OR RHINITIS. (Patient not taking: Reported on 11/01/2020)   [DISCONTINUED] FLUZONE HIGH-DOSE QUADRIVALENT 0.7 ML SUSY  (Patient not taking: Reported on 10/05/2020)   [DISCONTINUED] MAXITROL 3.5-10000-0.1 ophthalmic suspension Apply 1 drop to eye 3 (three) times daily.   No facility-administered encounter medications on file as of 11/01/2020.    Allergies (verified) Patient has no known allergies.   History: Past Medical History:   Diagnosis Date   Arrhythmia    Cough    CHRONIC   Depression    Disturbance of sleep    Diverticulitis    GERD (gastroesophageal reflux disease)    Hemorrhoids    Hypertension    OA (osteoarthritis)    Pure hypercholesterolemia    Sleep apnea    Stroke [facility])    Past Surgical History:  Procedure Laterality Date   CATARACT EXTRACTION W/PHACO Left 02/14/2016   Procedure: CATARACT EXTRACTION PHACO AND INTRAOCULAR LENS PLACEMENT (IOC);  Surgeon: Galen Manila, MD;  Location: [facility] ORS;  Service: Ophthalmology;  Laterality: Left;  [place] 53.7 AP% 21.5 CDE 11.58 FLUID PACK LOT # [id number]   CATARACT EXTRACTION W/PHACO Right 12/18/2018   Procedure: CATARACT EXTRACTION PHACO AND INTRAOCULAR LENS PLACEMENT (IOC)  RIGHT, VISION BLUE;  Surgeon: Elliot Cousin, MD;  Location: [facility] ORS;  Service: Ophthalmology;  Laterality: Right;  [place]  01:14 CDE 11.82 Fluid pack lot # [id number]   COLONOSCOPY     KNEE ARTHROSCOPY     TRANSURETHRAL RESECTION OF PROSTATE     Family History  Problem Relation Age of Onset   Hypertension Son        Posey Rea of parents health.   Prostate cancer Neg Hx    Bladder Cancer Neg Hx    Kidney cancer Neg Hx    Social History   Socioeconomic History   Marital status: Divorced    Spouse name: Not on file   Number of children: 3   Years of education: Not on file   Highest education level: 11th grade  Occupational History   Occupation: Retired  Tobacco Use   Smoking status: Never   Smokeless tobacco: Never   Tobacco comments:    smoking cessation materials not required  Vaping Use   Vaping Use: Never used  Substance and Sexual Activity   Alcohol use: No    Alcohol/week: 0.0 standard drinks   Drug use: No   Sexual activity: Not Currently  Other Topics Concern   Not on file  Social History Narrative   Lives at home alone.   Writes left-handed - does everything else with right-hand.   No caffeine use.      Never smoker; never alcohol; used to be truck  driver; retired part time from Systems analyst course. 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    Social Determinants of Health   Financial Resource Strain: Low Risk    Difficulty of Paying Living Expenses: Not hard at all  Food Insecurity: No Food Insecurity   Worried About Programme researcher, broadcasting/film/video in the Last Year: Never true   Ran Out of Food in the Last Year: Never true  Transportation Needs: No Transportation Needs   Lack of Transportation (Medical): No   Lack of Transportation (Non-Medical): No  Physical Activity: Inactive   Days of Exercise per Week: 0 days   Minutes of Exercise per Session: 0 min  Stress: No Stress Concern Present   Feeling of Stress : Not at all  Social Connections: Moderately Isolated   Frequency of Communication with Friends and  Family: More than three times a week   Frequency of Social Gatherings with Friends and Family: Twice a week   Attends Religious Services: More than 4 times per year   Active Member of [employer]ubs or Organizations: No   Attends Engineer, structuralClub or Organization Meetings: Never   Marital Status: Divorced    Tobacco Counseling Counseling given: Not Answered Tobacco comments: smoking cessation materials not required   Clinical Intake:  Pre-visit preparation completed: Yes  Pain : No/denies pain     Nutritional Risks: None Diabetes: No  How often do you need to have someone help you when you read instructions, pamphlets, or other written materials from your doctor or pharmacy?: 1 - Never    Interpreter Needed?: No  Information entered by :: Reather LittlerKasey Kasean Denherder LPN   Activities of Daily Living In your present state of health, do you have any difficulty performing the following activities: 11/01/2020 10/05/2020  Hearing? Malvin JohnsY Y  Vision? N N  Difficulty concentrating or making decisions? Malvin JohnsY Y  Walking or climbing stairs? Y Y  Comment - -  Dressing or bathing? N N  Doing errands, shopping? N N  Preparing Food and eating ? N -  Using the Toilet? N -  In the past six months, have  you accidently leaked urine? N -  Do you have problems with loss of bowel control? N -  Managing your Medications? N -  Managing your Finances? N -  Housekeeping or managing your Housekeeping? N -  Some recent data might be hidden    [facility] Care [facility] [place] [facility] - General (Family Medicine) Earna CoderBrahmanday, Govinda R, MD as Consulting Physician (Hematology and Oncology) Hulan FrayMcGowan, Shannon A, PA-C as Physician Assistant (Urology) Galen ManilaPorfilio, William, MD as Referring Physician (Ophthalmology)  Indicate any recent Medical Services you may have received from other than [facility] providers in the past year (date may be approximate).     Assessment:   This is a routine wellness examination for Khaliel.  Hearing/Vision screen Hearing Screening - Comments:: Pt denies hearing difficulty Vision Screening - Comments:: [facility]  Dietary issues and exercise activities discussed: Current Exercise Habits: The patient does not participate in regular exercise at present, Exercise limited by: orthopedic condition(s)   Goals Addressed             This Visit's Progress    DIET - INCREASE WATER INTAKE   On track    Recommend to drink at least 6-8 8oz glasses of water per day.        Depression Screen PHQ 2/9 Scores 11/01/2020 10/05/2020 05/23/2020 02/03/2020 01/05/2020 12/03/2019 11/12/2019  PHQ - 2 Score 0 0 0 0 0 0 0  PHQ- 9 Score 0 0 0 0 - - 0    Fall Risk Fall Risk  11/01/2020 10/05/2020 02/03/2020 01/05/2020 12/03/2019  Falls in the past year? 0 0 0 0 0  Number falls in past yr: 0 0 0 0 0  Injury with Fall? 0 0 0 0 0  Risk for fall due to : No Fall Risks - - - -  Risk for fall due to: Comment - - - - -  Follow up Falls prevention discussed Falls evaluation completed - - Falls evaluation completed    FALL RISK PREVENTION PERTAINING TO THE HOME:  Any stairs in or around the home? No  If so, are there any without handrails? No  Home free of  loose throw rugs in walkways, pet  beds, electrical cords, etc? Yes  Adequate lighting in your home to reduce risk of falls? Yes   ASSISTIVE DEVICES UTILIZED TO PREVENT FALLS:  Life alert? No  Use of a cane, walker or w/c? Yes  Grab bars in the bathroom? Yes  Shower chair or bench in shower? Yes  Elevated toilet seat or a handicapped toilet? Yes   TIMED UP AND GO:  Was the test performed? No . Telephonic visit.   Cognitive Function: Normal cognitive status assessed by direct observation by this Nurse Health Advisor. No abnormalities found.     MMSE - Mini Mental State Exam 10/26/2014  Orientation to time 4  Orientation to Place 4  Registration 3  Attention/ Calculation 3  Recall 1  Language- name 2 objects 2  Language- repeat 1  Language- follow 3 step command 3  Language- read & follow direction 1  Write a sentence 1  Copy design 1  Total score 24     6CIT Screen 10/29/2019 10/28/2018 10/24/2017  What Year? 0 points 0 points 0 points  What month? 0 points 0 points 0 points  What time? 0 points 0 points 3 points  Count back from 20 0 points 0 points 0 points  Months in reverse 2 points 2 points 4 points  Repeat phrase 4 points 6 points 2 points  Total Score Immunizations Immunization History  Administered Date(s) Administered   Influenza Split 02/17/2006, 12/30/2006, 01/20/2010   Influenza, High Dose Seasonal PF 12/29/2014, 12/24/2016, 11/29/2017, 12/01/2018   Influenza, Seasonal, Injecte, Preservative Fre 01/24/2011, 01/02/2012, 12/24/2012   Influenza,inj,Quad PF,6+ Mos 12/09/2013   Influenza-Unspecified 12/24/2016, 11/14/2017, 12/17/2019   Moderna Sars-Covid-2 Vaccination 04/14/2019, 05/12/2019, 02/17/2020, 07/19/2020   Pneumococcal Conjugate-13 01/25/2014, 03/17/2014   Pneumococcal Polysaccharide-23 09/23/2006, 12/30/2006   Td 02/26/2005   Zoster, Live 01/24/2011    TDAP status: Due, Education has been provided regarding the importance of this  vaccine. Advised may receive this vaccine at local pharmacy or Health Dept. Aware to provide a copy of the vaccination record if obtained from local pharmacy or Health Dept. Verbalized acceptance and understanding.  Flu Vaccine status: Up to date  Pneumococcal vaccine status: Up to date  Covid-19 vaccine status: Completed vaccines  Qualifies for Shingles Vaccine? Yes   Zostavax completed Yes   Shingrix Completed?: No.    Education has been provided regarding the importance of this vaccine. Patient has been advised to call insurance company to determine out of pocket expense if they have not yet received this vaccine. Advised may also receive vaccine at local pharmacy or Health Dept. Verbalized acceptance and understanding.  Screening Tests Health Maintenance  Topic Date Due   Zoster Vaccines- Shingrix (1 of 2) Never done   INFLUENZA VACCINE  10/31/2020   TETANUS/TDAP  12/02/2020 (Originally 02/27/2015)   COVID-19 Vaccine  Completed   PNA vac Low Risk Adult  Completed   HPV VACCINES  Aged Out    Health Maintenance  Health Maintenance Due  Topic Date Due   Zoster Vaccines- Shingrix (1 of 2) Never done   INFLUENZA VACCINE  10/31/2020    Colorectal cancer screening: No longer required.   Lung Cancer Screening: (Low Dose CT Chest recommended if Age 80-80 years, 30 pack-year currently smoking OR have quit w/in 15years.) does not qualify.   Additional Screening:  Hepatitis C Screening: does not qualify  Vision Screening: Recommended annual ophthalmology exams for early detection of glaucoma and other disorders of the eye. Is the  patient up to date with their annual eye exam?  Yes  Who is the provider or what is the name of the office in which the patient attends annual eye exams? [facility]  Dental Screening: Recommended annual dental exams for proper oral hygiene  Community Resource Referral / Chronic Care Management: CRR required this visit?  No   CCM required this  visit?  No      Plan:     I have personally reviewed and noted the following in the patient's chart:   Medical and social history Use of alcohol, tobacco or illicit drugs  Current medications and supplements including opioid prescriptions. Patient is not currently taking opioid prescriptions. Functional ability and status Nutritional status Physical activity Advanced directives List of other physicians Hospitalizations, surgeries, and ER visits in previous 12 months Vitals Screenings to include cognitive, depression, and falls Referrals and appointments  In addition, I have reviewed and discussed with patient certain preventive protocols, quality metrics, and best practice recommendations. A written personalized care plan for preventive services as well as general preventive health recommendations were provided to patient.     Reather Littler, LPN   07/01/9377   Nurse Notes: none

## 2020-11-01 NOTE — Patient Instructions (Signed)
Jeffery Campbell , Thank you for taking time to come for your Medicare Wellness Visit. I appreciate your ongoing commitment to your health goals. Please review the following plan we discussed and let me know if I can assist you in the future.   Screening recommendations/referrals: Colonoscopy: no longer required Recommended yearly ophthalmology/optometry visit for glaucoma screening and checkup Recommended yearly dental visit for hygiene and checkup  Vaccinations: Influenza vaccine: done 12/17/19 Pneumococcal vaccine: done 03/17/14 Tdap vaccine: due Shingles vaccine: Shingrix discussed. Please contact your pharmacy for coverage information.  Covid-19: done 04/14/19, 05/12/19, 02/17/20 & 07/20/19  Advanced directives: Please bring a copy of your health care power of attorney and living will to the office at your convenience.   Conditions/risks identified: Recommend continuing fall prevention in the home  Next appointment: Follow up in one year for your annual wellness visit.   Preventive Care 42 Years and Older, Male Preventive care refers to lifestyle choices and visits with your health care provider that can promote health and wellness. What does preventive care include? A yearly physical exam. This is also called an annual well check. Dental exams once or twice a year. Routine eye exams. Ask your health care provider how often you should have your eyes checked. Personal lifestyle choices, including: Daily care of your teeth and gums. Regular physical activity. Eating a healthy diet. Avoiding tobacco and drug use. Limiting alcohol use. Practicing safe sex. Taking low doses of aspirin every day. Taking vitamin and mineral supplements as recommended by your health care provider. What happens during an annual well check? The services and screenings done by your health care provider during your annual well check will depend on your age, overall health, lifestyle risk factors, and family  history of disease. Counseling  Your health care provider may ask you questions about your: Alcohol use. Tobacco use. Drug use. Emotional well-being. Home and relationship well-being. Sexual activity. Eating habits. History of falls. Memory and ability to understand (cognition). Work and work Astronomer. Screening  You may have the following tests or measurements: Height, weight, and BMI. Blood pressure. Lipid and cholesterol levels. These may be checked every 5 years, or more frequently if you are over 85 years old. Skin check. Lung cancer screening. You may have this screening every year starting at age 19 if you have a 30-pack-year history of smoking and currently smoke or have quit within the past 15 years. Fecal occult blood test (FOBT) of the stool. You may have this test every year starting at age 8. Flexible sigmoidoscopy or colonoscopy. You may have a sigmoidoscopy every 5 years or a colonoscopy every 10 years starting at age 41. Prostate cancer screening. Recommendations will vary depending on your family history and other risks. Hepatitis C blood test. Hepatitis B blood test. Sexually transmitted disease (STD) testing. Diabetes screening. This is done by checking your blood sugar (glucose) after you have not eaten for a while (fasting). You may have this done every 1-3 years. Abdominal aortic aneurysm (AAA) screening. You may need this if you are a current or former smoker. Osteoporosis. You may be screened starting at age 36 if you are at high risk. Talk with your health care provider about your test results, treatment options, and if necessary, the need for more tests. Vaccines  Your health care provider may recommend certain vaccines, such as: Influenza vaccine. This is recommended every year. Tetanus, diphtheria, and acellular pertussis (Tdap, Td) vaccine. You may need a Td booster every 10 years. Zoster vaccine.  You may need this after age 50. Pneumococcal  13-valent conjugate (PCV13) vaccine. One dose is recommended after age 55. Pneumococcal polysaccharide (PPSV23) vaccine. One dose is recommended after age 44. Talk to your health care provider about which screenings and vaccines you need and how often you need them. This information is not intended to replace advice given to you by your health care provider. Make sure you discuss any questions you have with your health care provider. Document Released: 04/15/2015 Document Revised: 12/07/2015 Document Reviewed: 01/18/2015 Elsevier Interactive Patient Education  2017 Jeffery Campbell Prevention in the Home Falls can cause injuries. They can happen to people of all ages. There are many things you can do to make your home safe and to help prevent falls. What can I do on the outside of my home? Regularly fix the edges of walkways and driveways and fix any cracks. Remove anything that might make you trip as you walk through a door, such as a raised step or threshold. Trim any bushes or trees on the path to your home. Use bright outdoor lighting. Clear any walking paths of anything that might make someone trip, such as rocks or tools. Regularly check to see if handrails are loose or broken. Make sure that both sides of any steps have handrails. Any raised decks and porches should have guardrails on the edges. Have any leaves, snow, or ice cleared regularly. Use sand or salt on walking paths during winter. Clean up any spills in your garage right away. This includes oil or grease spills. What can I do in the bathroom? Use night lights. Install grab bars by the toilet and in the tub and shower. Do not use towel bars as grab bars. Use non-skid mats or decals in the tub or shower. If you need to sit down in the shower, use a plastic, non-slip stool. Keep the floor dry. Clean up any water that spills on the floor as soon as it happens. Remove soap buildup in the tub or shower regularly. Attach  bath mats securely with double-sided non-slip rug tape. Do not have throw rugs and other things on the floor that can make you trip. What can I do in the bedroom? Use night lights. Make sure that you have a light by your bed that is easy to reach. Do not use any sheets or blankets that are too big for your bed. They should not hang down onto the floor. Have a firm chair that has side arms. You can use this for support while you get dressed. Do not have throw rugs and other things on the floor that can make you trip. What can I do in the kitchen? Clean up any spills right away. Avoid walking on wet floors. Keep items that you use a lot in easy-to-reach places. If you need to reach something above you, use a strong step stool that has a grab bar. Keep electrical cords out of the way. Do not use floor polish or wax that makes floors slippery. If you must use wax, use non-skid floor wax. Do not have throw rugs and other things on the floor that can make you trip. What can I do with my stairs? Do not leave any items on the stairs. Make sure that there are handrails on both sides of the stairs and use them. Fix handrails that are broken or loose. Make sure that handrails are as long as the stairways. Check any carpeting to make sure that it is  firmly attached to the stairs. Fix any carpet that is loose or worn. Avoid having throw rugs at the top or bottom of the stairs. If you do have throw rugs, attach them to the floor with carpet tape. Make sure that you have a light switch at the top of the stairs and the bottom of the stairs. If you do not have them, ask someone to add them for you. What else can I do to help prevent falls? Wear shoes that: Do not have high heels. Have rubber bottoms. Are comfortable and fit you well. Are closed at the toe. Do not wear sandals. If you use a stepladder: Make sure that it is fully opened. Do not climb a closed stepladder. Make sure that both sides of the  stepladder are locked into place. Ask someone to hold it for you, if possible. Clearly mark and make sure that you can see: Any grab bars or handrails. First and last steps. Where the edge of each step is. Use tools that help you move around (mobility aids) if they are needed. These include: Canes. Walkers. Scooters. Crutches. Turn on the lights when you go into a dark area. Replace any light bulbs as soon as they burn out. Set up your furniture so you have a clear path. Avoid moving your furniture around. If any of your floors are uneven, fix them. If there are any pets around you, be aware of where they are. Review your medicines with your doctor. Some medicines can make you feel dizzy. This can increase your chance of falling. Ask your doctor what other things that you can do to help prevent falls. This information is not intended to replace advice given to you by your health care provider. Make sure you discuss any questions you have with your health care provider. Document Released: 01/13/2009 Document Revised: 08/25/2015 Document Reviewed: 04/23/2014 Elsevier Interactive Patient Education  2017 [employer].

## 2020-11-02 ENCOUNTER — Other Ambulatory Visit: Payer: Self-pay | Admitting: Family Medicine

## 2020-11-02 ENCOUNTER — Telehealth: Payer: Self-pay | Admitting: Family Medicine

## 2020-11-02 DIAGNOSIS — F419 Anxiety disorder, unspecified: Secondary | ICD-10-CM

## 2020-11-02 NOTE — Telephone Encounter (Signed)
Pt has an appt.

## 2020-11-07 ENCOUNTER — Ambulatory Visit: Payer: Self-pay | Admitting: *Deleted

## 2020-11-07 NOTE — Telephone Encounter (Signed)
Patient had to void during the night more than usual-he thinks 6 times. Denies pain, burning,hesitancy and does not feel his bladder is not emptying. He drank tea yesterday which he usually does not. Drank more than his usual and probably later than usual. Call back any symptoms.   Reason for Disposition  Health Information question, no triage required and triager able to answer question  Answer Assessment - Initial Assessment Questions 1. REASON FOR CALL or QUESTION: "What is your reason for calling today?" or "How can I best help you?" or "What question do you have that I can help answer?"     Got up to void several times during the night which is not his usual.  Protocols used: Information Only Call - No Triage-A-AH

## 2020-11-10 DIAGNOSIS — H938X3 Other specified disorders of ear, bilateral: Secondary | ICD-10-CM | POA: Diagnosis not present

## 2020-11-10 DIAGNOSIS — H6123 Impacted cerumen, bilateral: Secondary | ICD-10-CM | POA: Diagnosis not present

## 2020-11-14 ENCOUNTER — Ambulatory Visit: Payer: Self-pay

## 2020-11-14 NOTE — Telephone Encounter (Signed)
Patient would like a nurse to call him back regarding the name of his blood pressure medication    Pt. Wanted to review his medications. Verbalizes understanding.

## 2020-11-15 DIAGNOSIS — R030 Elevated blood-pressure reading, without diagnosis of hypertension: Secondary | ICD-10-CM | POA: Diagnosis not present

## 2020-11-22 DIAGNOSIS — I10 Essential (primary) hypertension: Secondary | ICD-10-CM | POA: Diagnosis not present

## 2020-11-22 NOTE — Progress Notes (Signed)
11/23/2020  10:58 AM   Jeffery Campbell 25-Apr-1928 [id number]  Referring provider: Jamelle Haring, MD [address] [place],  [place] [address]  Urological history: 1. BPH with LU TS -I PSS 3/2 -Managed with alfuzosin 10 mg daily and finasteride 5 mg daily  Chief Complaint  Patient presents with   Benign Prostatic Hypertrophy     HPI: Jeffery Campbell is a 87 y.o. male who presents for a yearly visit.   No urinary complaints.  Patient denies any modifying or aggravating factors.  Patient denies any gross hematuria, dysuria or suprapubic/flank pain.  Patient denies any fevers, chills, nausea or vomiting.     IPSS     Row Name 11/23/20 1000         International Prostate Symptom Score   How often have you had the sensation of not emptying your bladder? Not at All     How often have you had to urinate less than every two hours? Not at All     How often have you found you stopped and started again several times when you urinated? Less than half the time     How often have you found it difficult to postpone urination? Not at All     How often have you had a weak urinary stream? Not at All     How often have you had to strain to start urination? Not at All     How many times did you typically get up at night to urinate? 1 Time     Total IPSS Score 3           Quality of Life due to urinary symptoms   If you were to spend the rest of your life with your urinary condition just the way it is now how would you feel about that? Mostly Satisfied               Score:  1-7 Mild 8-19 Moderate 20-35 Severe   PMH: Past Medical History:  Diagnosis Date   Arrhythmia    Cough    CHRONIC   Depression    Disturbance of sleep    Diverticulitis    GERD (gastroesophageal reflux disease)    Hemorrhoids    Hypertension    OA (osteoarthritis)    Pure hypercholesterolemia    Sleep apnea    Stroke [facility])     Surgical History: Past Surgical History:  Procedure  Laterality Date   CATARACT EXTRACTION W/PHACO Left 02/14/2016   Procedure: CATARACT EXTRACTION PHACO AND INTRAOCULAR LENS PLACEMENT (IOC);  Surgeon: Galen Manila, MD;  Location: [facility] ORS;  Service: Ophthalmology;  Laterality: Left;  [place] 53.7 AP% 21.5 CDE 11.58 FLUID PACK LOT # [id number]   CATARACT EXTRACTION W/PHACO Right 12/18/2018   Procedure: CATARACT EXTRACTION PHACO AND INTRAOCULAR LENS PLACEMENT (IOC)  RIGHT, VISION BLUE;  Surgeon: Elliot Cousin, MD;  Location: [facility] ORS;  Service: Ophthalmology;  Laterality: Right;  [place]  01:14 CDE 11.82 Fluid pack lot # [id number]   COLONOSCOPY     KNEE ARTHROSCOPY     TRANSURETHRAL RESECTION OF PROSTATE      Home Medications:  Allergies as of 11/23/2020   No Known Allergies      Medication List        Accurate as of November 23, 2020 10:58 AM. If you have any questions, ask your nurse or doctor.          acetaminophen 325 MG tablet Commonly known as: TYLENOL Take  325-650 mg by mouth every 6 (six) hours as needed for moderate pain or headache.   alfuzosin 10 MG 24 hr tablet Commonly known as: UROXATRAL Take 1 tablet (10 mg total) by mouth daily with breakfast.   atorvastatin 20 MG tablet Commonly known as: LIPITOR TAKE 1 TABLET BY MOUTH EVERYDAY AT BEDTIME   B-12 3000 MCG Subl Place 1,000 mcg under the tongue daily.   busPIRone 10 MG tablet Commonly known as: BUSPAR TAKE 1 TABLET BY MOUTH TWICE A DAY AS NEEDED   clopidogrel 75 MG tablet Commonly known as: PLAVIX Take 1 tablet (75 mg total) by mouth daily. (stop aggrenox)   diltiazem 120 MG 24 hr capsule Commonly known as: CARDIZEM CD TAKE 1 CAPSULE BY MOUTH EVERY DAY   docusate sodium 100 MG capsule Commonly known as: COLACE Take 100 mg by mouth daily as needed for mild constipation.   finasteride 5 MG tablet Commonly known as: PROSCAR Take 1 tablet (5 mg total) by mouth daily.   Fish Oil 1200 MG Caps Take by mouth.   fluticasone 50 MCG/ACT nasal spray Commonly  known as: FLONASE PLACE 2 SPRAYS INTO BOTH NOSTRILS DAILY AS NEEDED FOR ALLERGIES OR RHINITIS.   levocetirizine 5 MG tablet Commonly known as: XYZAL TAKE 1 TABLET BY MOUTH EVERY DAY IN THE EVENING   meclizine 25 MG tablet Commonly known as: ANTIVERT TAKE 1/2 TO 1 TABLET BY MOUTH UP TO DAILY AS NEEDED FOR DIZZINESS, USE SPARINGLY.   Systane Balance 0.6 % Soln Generic drug: Propylene Glycol Place 1 drop into both eyes 3 (three) times daily as needed (dry eyes).        Allergies: No Known Allergies  Family History: Family History  Problem Relation Age of Onset   Hypertension Son        Posey Rea of parents health.   Prostate cancer Neg Hx    Bladder Cancer Neg Hx    Kidney cancer Neg Hx     Social History:  reports that he has never smoked. He has never used smokeless tobacco. He reports that he does not drink alcohol and does not use drugs.  ROS: For pertinent review of systems please refer to history of present illness  Physical Exam: BP (!) 150/70   Pulse 73   Ht 6\' 1"  (1.854 m)   Wt 236 lb (107 kg)   BMI 31.14 kg/m   Constitutional:  Well nourished. Alert and oriented, No acute distress. HEENT: [place] AT, mask in place.  Trachea midline Cardiovascular: No clubbing, cyanosis, or edema. Respiratory: Normal respiratory effort, no increased work of breathing. Neurologic: Grossly intact, no focal deficits, moving all 4 extremities. Psychiatric: Normal mood and affect.   Laboratory Data: Lab Results  Component Value Date   WBC 8.2 02/10/2020   HGB 17.1 (H) 02/10/2020   HCT 48.0 02/10/2020   MCV 88.1 02/10/2020   PLT 268 02/10/2020    Lab Results  Component Value Date   CREATININE 1.06 10/05/2020    Lab Results  Component Value Date   HGBA1C 5.0 05/23/2020   Results for orders placed or performed in visit on 10/05/20  B12  Result Value Ref Range   Vitamin B-12 372 200 - 1,100 pg/mL  Basic Metabolic Panel (BMET)  Result Value Ref Range   Glucose, Bld 94 65  - 99 mg/dL   BUN 13 7 - 25 mg/dL   Creat 12/06/20 7.84 - 6.96 mg/dL   BUN/Creatinine Ratio NOT APPLICABLE 6 - 22 (calc)  Sodium 139 135 - 146 mmol/L   Potassium 3.9 3.5 - 5.3 mmol/L   Chloride 104 98 - 110 mmol/L   CO2 25 20 - 32 mmol/L   Calcium 9.5 8.6 - 10.3 mg/dL  I have reviewed the labs.   Assessment & Plan:    1. BPH with LU TS - symptoms stable  -continue alfuzosin 10 mg and finasteride 5 mg daily-refill given  Return in about 1 year (around 11/23/2021) for I PSS .  These notes generated with voice recognition software. I apologize for typographical errors.  Michiel Cowboy, PA-C  [facility] [address]  [address] [place], [place] [address] [phone]

## 2020-11-23 ENCOUNTER — Other Ambulatory Visit: Payer: Self-pay

## 2020-11-23 ENCOUNTER — Ambulatory Visit (INDEPENDENT_AMBULATORY_CARE_PROVIDER_SITE_OTHER): Payer: PPO | Admitting: Urology

## 2020-11-23 ENCOUNTER — Encounter: Payer: Self-pay | Admitting: Urology

## 2020-11-23 VITALS — BP 150/70 | HR 73 | Ht 73.0 in | Wt 236.0 lb

## 2020-11-23 DIAGNOSIS — N138 Other obstructive and reflux uropathy: Secondary | ICD-10-CM

## 2020-11-23 DIAGNOSIS — N401 Enlarged prostate with lower urinary tract symptoms: Secondary | ICD-10-CM

## 2020-11-23 MED ORDER — FINASTERIDE 5 MG PO TABS: 5.0000 mg | ORAL_TABLET | Freq: Every day | ORAL | 3 refills | Status: DC

## 2020-11-23 MED ORDER — ALFUZOSIN HCL ER 10 MG PO TB24: 10.0000 mg | ORAL_TABLET | Freq: Every day | ORAL | 3 refills | Status: DC

## 2020-11-24 DIAGNOSIS — H04223 Epiphora due to insufficient drainage, bilateral lacrimal glands: Secondary | ICD-10-CM | POA: Diagnosis not present

## 2020-11-29 DIAGNOSIS — J9610 Chronic respiratory failure, unspecified whether with hypoxia or hypercapnia: Secondary | ICD-10-CM | POA: Diagnosis not present

## 2020-11-29 DIAGNOSIS — E1151 Type 2 diabetes mellitus with diabetic peripheral angiopathy without gangrene: Secondary | ICD-10-CM | POA: Diagnosis not present

## 2020-11-29 DIAGNOSIS — J449 Chronic obstructive pulmonary disease, unspecified: Secondary | ICD-10-CM | POA: Diagnosis not present

## 2020-11-29 DIAGNOSIS — D692 Other nonthrombocytopenic purpura: Secondary | ICD-10-CM | POA: Diagnosis not present

## 2020-11-29 DIAGNOSIS — N183 Chronic kidney disease, stage 3 unspecified: Secondary | ICD-10-CM | POA: Diagnosis not present

## 2020-11-29 DIAGNOSIS — E1169 Type 2 diabetes mellitus with other specified complication: Secondary | ICD-10-CM | POA: Diagnosis not present

## 2020-11-29 DIAGNOSIS — Z9981 Dependence on supplemental oxygen: Secondary | ICD-10-CM | POA: Diagnosis not present

## 2020-11-29 DIAGNOSIS — I129 Hypertensive chronic kidney disease with stage 1 through stage 4 chronic kidney disease, or unspecified chronic kidney disease: Secondary | ICD-10-CM | POA: Diagnosis not present

## 2020-11-29 DIAGNOSIS — E1122 Type 2 diabetes mellitus with diabetic chronic kidney disease: Secondary | ICD-10-CM | POA: Diagnosis not present

## 2020-12-12 ENCOUNTER — Telehealth: Payer: Self-pay | Admitting: Family Medicine

## 2020-12-12 NOTE — Telephone Encounter (Signed)
Pt needs an appointment to address or needs to call his Urologist.

## 2020-12-12 NOTE — Telephone Encounter (Signed)
Pt called in and asked if the provider can call in a medication to the pharmacy for him/ he has been waking up 3-4 times during the night to use the bathroom/ he stated there is no pain and this is not a new issue/ please advise

## 2020-12-13 ENCOUNTER — Other Ambulatory Visit: Payer: Self-pay

## 2020-12-13 ENCOUNTER — Encounter: Payer: Self-pay | Admitting: Nurse Practitioner

## 2020-12-13 ENCOUNTER — Ambulatory Visit (INDEPENDENT_AMBULATORY_CARE_PROVIDER_SITE_OTHER): Payer: PPO | Admitting: Nurse Practitioner

## 2020-12-13 VITALS — BP 122/76 | HR 72 | Temp 98.0°F | Resp 16 | Ht 73.0 in | Wt 237.0 lb

## 2020-12-13 DIAGNOSIS — R3915 Urgency of urination: Secondary | ICD-10-CM | POA: Diagnosis not present

## 2020-12-13 DIAGNOSIS — R350 Frequency of micturition: Secondary | ICD-10-CM | POA: Diagnosis not present

## 2020-12-13 DIAGNOSIS — N401 Enlarged prostate with lower urinary tract symptoms: Secondary | ICD-10-CM | POA: Diagnosis not present

## 2020-12-13 DIAGNOSIS — R351 Nocturia: Secondary | ICD-10-CM | POA: Diagnosis not present

## 2020-12-13 DIAGNOSIS — Z23 Encounter for immunization: Secondary | ICD-10-CM | POA: Diagnosis not present

## 2020-12-13 LAB — POCT URINALYSIS DIPSTICK
Bilirubin, UA: NEGATIVE
Glucose, UA: NEGATIVE
Ketones, UA: NEGATIVE
Nitrite, UA: NEGATIVE
Protein, UA: POSITIVE — AB
Spec Grav, UA: 1.02 (ref 1.010–1.025)
Urobilinogen, UA: 0.2 E.U./dL
pH, UA: 5 (ref 5.0–8.0)

## 2020-12-13 MED ORDER — CEPHALEXIN 500 MG PO CAPS: 500.0000 mg | ORAL_CAPSULE | Freq: Four times a day (QID) | ORAL | 0 refills | Status: AC

## 2020-12-13 NOTE — Progress Notes (Signed)
BP 122/76   Pulse 72   Temp 98 F (36.7 C) (Oral)   Resp 16   Ht 6\' 1"  (1.854 m)   Wt 237 lb (107.5 kg)   SpO2 95%   BMI 31.27 kg/m    Subjective:    Patient ID: Jeffery Campbell, male    DOB: [birth year], 87 y.o.   MRN: [id number]  HPI: Jeffery Campbell is a 87 y.o. male, here alone  Chief Complaint  Patient presents with   Urinary Frequency    Mainly at night   Urinary frequency/urinary urgency:  He says that he has had to get up quickly and rush to the bathroom for about two weeks.  He says he drips all the way to the bathroom.  He says he has to go multiple times and it does not matter if he just went.  He denies any pain or fever.  He is established with a urologist, [name] PA.  Discussed treatment plan and he is to follow-up with urology if symptoms persist. He is agreeable to plan.  BPH: He says he is currently taking finasteride 5mg  daily, alfuzosin 10 mg daily.  He says he saw urology last month and was not having the above symptoms.  Will teat for UTI if symptoms persist patient will call his urologist for a follow-up.  Relevant past medical, surgical, family and social history reviewed and updated as indicated. Interim medical history since our last visit reviewed. Allergies and medications reviewed and updated.  Review of Systems  Constitutional: Negative for fever or weight change.  Respiratory: Negative for cough and shortness of breath.   Cardiovascular: Negative for chest pain or palpitations.  Gastrointestinal: Negative for abdominal pain, no bowel changes.  Genitourinary: Negative for dysuria,  Positive for frequency and urgency Musculoskeletal: Negative for gait problem or joint swelling.  Skin: Negative for rash.  Neurological: Negative for dizziness or headache.  No other specific complaints in a complete review of systems (except as listed in HPI above).      Objective:    BP 122/76   Pulse 72   Temp 98 F (36.7 C) (Oral)   Resp 16   Ht 6\' 1"  (1.854 m)    Wt 237 lb (107.5 kg)   SpO2 95%   BMI 31.27 kg/m   Wt Readings from Last 3 Encounters:  12/13/20 237 lb (107.5 kg)  11/23/20 236 lb (107 kg)  10/05/20 236 lb 8 oz (107.3 kg)    Physical Exam  Constitutional: Patient appears well-developed and well-nourished. No distress.  HEENT: head atraumatic, normocephalic, pupils equal and reactive to light, neck supple Cardiovascular: Normal rate, regular rhythm and normal heart sounds.  No murmur heard. No BLE edema. Pulmonary/Chest: Effort normal and breath sounds normal. No respiratory distress. Abdominal: Soft.  There is no tenderness, no CVA tenderness Psychiatric: Patient has a normal mood and affect. behavior is normal. Judgment and thought content normal.   Results for orders placed or performed in visit on 12/13/20  POCT urinalysis dipstick  Result Value Ref Range   Color, UA yellow    Clarity, UA clear    Glucose, UA Negative Negative   Bilirubin, UA negative    Ketones, UA negative    Spec Grav, UA 1.020 1.010 - 1.025   Blood, UA moderate    pH, UA 5.0 5.0 - 8.0   Protein, UA Positive (A) Negative   Urobilinogen, UA 0.2 0.2 or 1.0 E.U./dL   Nitrite, UA negative    Leukocytes,  UA Large (3+) (A) Negative   Appearance clear    Odor none       Assessment & Plan:   1. Urinary frequency -follow up with urology if symptoms persist - POCT urinalysis dipstick - Urine Culture - cephALEXin (KEFLEX) 500 MG capsule; Take 1 capsule (500 mg total) by mouth 4 (four) times daily for 7 days.  Dispense: 28 capsule; Refill: 0   2. Urinary urgency  - POCT urinalysis dipstick - Urine Culture - cephALEXin (KEFLEX) 500 MG capsule; Take 1 capsule (500 mg total) by mouth 4 (four) times daily for 7 days.  Dispense: 28 capsule; Refill: 0   3. Benign prostatic hyperplasia with nocturia - follow-up with urology if symptoms persist  4. Need for influenza vaccination  - Flu Vaccine QUAD High Dose(Fluad)    Follow up plan: Return if  symptoms worsen or fail to improve, for follow up with urology if no better.

## 2020-12-14 ENCOUNTER — Telehealth: Payer: Self-pay

## 2020-12-14 ENCOUNTER — Ambulatory Visit: Payer: PPO | Admitting: Nurse Practitioner

## 2020-12-14 LAB — URINE CULTURE
MICRO NUMBER:: 12367488
SPECIMEN QUALITY:: ADEQUATE

## 2020-12-14 NOTE — Telephone Encounter (Signed)
Pt.notified

## 2020-12-14 NOTE — Telephone Encounter (Signed)
Please clarify directions is he supposed to take 1 capsulse 4x daily?

## 2020-12-14 NOTE — Telephone Encounter (Signed)
Copied from CRM [id number]. Topic: Quick Communication - Rx Refill/Question >> Dec 14, 2020  9:34 AM Jeffery Campbell, Rosey Bath D wrote: Medication: cephALEXin (KEFLEX) 500 MG capsule  Has the patient contacted their pharmacy? No. Patient has a question about how he needs to take his medication. (Agent: If no, request that the patient contact the pharmacy for the refill.) (Agent: If yes, when and what did the pharmacy advise?)  Preferred Pharmacy (with phone number or street name): [id number] - [place], [place] - [address]  Agent: Please be advised that RX refills may take up to 3 business days. We ask that you follow-up with your pharmacy.

## 2020-12-15 ENCOUNTER — Other Ambulatory Visit: Payer: Self-pay | Admitting: Family Medicine

## 2020-12-15 ENCOUNTER — Telehealth: Payer: Self-pay

## 2020-12-15 DIAGNOSIS — F419 Anxiety disorder, unspecified: Secondary | ICD-10-CM

## 2020-12-15 DIAGNOSIS — R42 Dizziness and giddiness: Secondary | ICD-10-CM

## 2020-12-15 MED ORDER — MECLIZINE HCL 12.5 MG PO TABS: 12.5000 mg | ORAL_TABLET | Freq: Every day | ORAL | 0 refills | Status: DC | PRN

## 2020-12-15 NOTE — Telephone Encounter (Signed)
Requested medication (s) are due for refill today: yes  Requested medication (s) are on the active medication list: yes  Last refill:  06/16/20 #90 1 refill  Future visit scheduled: yes in 1 month   Notes to clinic:  last ordered by A. Rumball, DO      Requested Prescriptions  Pending Prescriptions Disp Refills   diltiazem (CARDIZEM CD) 120 MG 24 hr capsule 90 capsule 1    Sig: TAKE 1 CAPSULE BY MOUTH EVERY DAY     Cardiovascular:  Calcium Channel Blockers Passed - 12/15/2020  2:50 PM      Passed - Last BP in normal range    BP Readings from Last 1 Encounters:  12/13/20 122/76          Passed - Valid encounter within last 6 months    Recent Outpatient Visits           2 days ago Urinary frequency   [facility] Berniece Salines, [name]   2 months ago Essential hypertension   [facility] Caro Laroche, DO   6 months ago Essential hypertension   [facility] Dense M, DO   8 months ago Erythrocytosis   [facility] Jamelle Haring, MD   10 months ago Dizzy spells   [facility] Jamelle Haring, MD       Future Appointments             In 1 month Danelle Berry, PA-C [facility]   In 10 months  [facility]   In 11 months [name], Elana Alm [facility]

## 2020-12-15 NOTE — Telephone Encounter (Signed)
Patient is all out of medication/Medication Refill - Medication: diltiazem (CARDIZEM CD) 120 MG 24 hr capsule   Has the patient contacted their pharmacy? yes (Agent: If no, request that the patient contact the pharmacy for the refill.) (Agent: If yes, when and what did the pharmacy advise?)contact pcp  Preferred Pharmacy (with phone number or street name):  [id number] [place], [place] - [address] Phone:  [phone]  Fax:  [phone]      Agent: Please be advised that RX refills may take up to 3 business days. We ask that you follow-up with your pharmacy.

## 2020-12-15 NOTE — Telephone Encounter (Signed)
Copied from CRM [id number]. Topic: General - Inquiry >> Dec 15, 2020  1:54 PM Elliot Gault wrote: Reason for CRM: Patient was last seen 12/13/2020 and was advised to follow up with Harle Battiest, urologist. Patient sees specialist yearly and would like to know if PCP wanted to see specialist as soon as possible. please advise patient directly.

## 2020-12-16 NOTE — Telephone Encounter (Signed)
Left message for patient if he continue to have issues with urine frequency to contact his Urologist

## 2020-12-19 MED ORDER — DILTIAZEM HCL ER COATED BEADS 120 MG PO CP24: ORAL_CAPSULE | ORAL | 0 refills | Status: DC

## 2020-12-19 NOTE — Telephone Encounter (Signed)
Pt is calling checking refill on diltiazem 120 mg. Pt would like callback from nurse

## 2021-01-31 ENCOUNTER — Ambulatory Visit (INDEPENDENT_AMBULATORY_CARE_PROVIDER_SITE_OTHER): Payer: PPO | Admitting: Family Medicine

## 2021-01-31 ENCOUNTER — Encounter: Payer: Self-pay | Admitting: Family Medicine

## 2021-01-31 ENCOUNTER — Other Ambulatory Visit: Payer: Self-pay

## 2021-01-31 VITALS — BP 110/56 | HR 80 | Temp 98.4°F | Resp 16 | Ht 73.0 in | Wt 233.6 lb

## 2021-01-31 DIAGNOSIS — N1831 Chronic kidney disease, stage 3a: Secondary | ICD-10-CM

## 2021-01-31 DIAGNOSIS — Z6830 Body mass index (BMI) 30.0-30.9, adult: Secondary | ICD-10-CM | POA: Diagnosis not present

## 2021-01-31 DIAGNOSIS — G4733 Obstructive sleep apnea (adult) (pediatric): Secondary | ICD-10-CM

## 2021-01-31 DIAGNOSIS — R42 Dizziness and giddiness: Secondary | ICD-10-CM

## 2021-01-31 DIAGNOSIS — G8194 Hemiplegia, unspecified affecting left nondominant side: Secondary | ICD-10-CM | POA: Diagnosis not present

## 2021-01-31 DIAGNOSIS — Z7902 Long term (current) use of antithrombotics/antiplatelets: Secondary | ICD-10-CM

## 2021-01-31 DIAGNOSIS — Z5181 Encounter for therapeutic drug level monitoring: Secondary | ICD-10-CM | POA: Diagnosis not present

## 2021-01-31 DIAGNOSIS — J310 Chronic rhinitis: Secondary | ICD-10-CM

## 2021-01-31 DIAGNOSIS — I493 Ventricular premature depolarization: Secondary | ICD-10-CM | POA: Diagnosis not present

## 2021-01-31 DIAGNOSIS — D582 Other hemoglobinopathies: Secondary | ICD-10-CM | POA: Insufficient documentation

## 2021-01-31 DIAGNOSIS — J329 Chronic sinusitis, unspecified: Secondary | ICD-10-CM

## 2021-01-31 DIAGNOSIS — E538 Deficiency of other specified B group vitamins: Secondary | ICD-10-CM | POA: Diagnosis not present

## 2021-01-31 DIAGNOSIS — N401 Enlarged prostate with lower urinary tract symptoms: Secondary | ICD-10-CM

## 2021-01-31 DIAGNOSIS — I10 Essential (primary) hypertension: Secondary | ICD-10-CM

## 2021-01-31 DIAGNOSIS — H8110 Benign paroxysmal vertigo, unspecified ear: Secondary | ICD-10-CM | POA: Diagnosis not present

## 2021-01-31 DIAGNOSIS — F419 Anxiety disorder, unspecified: Secondary | ICD-10-CM | POA: Diagnosis not present

## 2021-01-31 DIAGNOSIS — E6609 Other obesity due to excess calories: Secondary | ICD-10-CM | POA: Diagnosis not present

## 2021-01-31 MED ORDER — FISH OIL 1200 MG PO CAPS: 2.0000 | ORAL_CAPSULE | Freq: Two times a day (BID) | ORAL | Status: AC

## 2021-01-31 MED ORDER — BUSPIRONE HCL 10 MG PO TABS: 10.0000 mg | ORAL_TABLET | Freq: Two times a day (BID) | ORAL | 3 refills | Status: DC | PRN

## 2021-01-31 MED ORDER — LEVOCETIRIZINE DIHYDROCHLORIDE 5 MG PO TABS: ORAL_TABLET | ORAL | 1 refills | Status: DC

## 2021-01-31 MED ORDER — FLUTICASONE PROPIONATE 50 MCG/ACT NA SUSP: 2.0000 | Freq: Every day | NASAL | 2 refills | Status: DC | PRN

## 2021-01-31 MED ORDER — MECLIZINE HCL 12.5 MG PO TABS: 12.5000 mg | ORAL_TABLET | Freq: Every day | ORAL | 0 refills | Status: DC | PRN

## 2021-01-31 MED ORDER — ASPIRIN 81 MG PO TBEC: 81.0000 mg | DELAYED_RELEASE_TABLET | Freq: Every day | ORAL | 11 refills | Status: DC

## 2021-01-31 NOTE — Patient Instructions (Addendum)
Stop plavix and start a 81 mg aspirin daily - new studies have shown aspirin to be as effective as plavix and have less risk of bleeding  If you have any worse dizziness we will need you to let us know so we can help you follow up with neurology and/or Physical therapy   You can restart a B12 supplement - just take a lower dose than what you were taking last year   Cholesterol - continue atorvastatin and omega 3 supplement  Hypertension /blood pressure  and PVC's - cardizem/diltiazem   Allergies - nasal and ear symptoms - can continue allergy meds including xyzal and flonase  Anxiety/moods - can continue to take buspar up to twice a day  Dizziness/vertigo - can continue to take meclizine as needed for dizzy episodes - but you NEED to get checked out or come in here if it ever gets suddenly worse or more frequent  History of stoke - continue to take lipitor and take 81 mg aspirin once a day to prevent another stroke  OSA - obstructive sleep apnea - you may need a follow up sleep study to check on this and see if you still need a CPAP machine

## 2021-01-31 NOTE — Progress Notes (Signed)
Name: Jeffery Campbell   MRN: [id number]    DOB: 1928-08-23   Date:01/31/2021       Progress Note  Chief Complaint  Patient presents with   Follow-up   Anxiety   Hypertension   Dizziness    Pt stated has noticed dizziness, comes and go but takes Buspirone to help.     Subjective:   Jeffery BrittleJoe Ellis is a 87 y.o. male, presents to clinic for routine f/up and med refill  He has seen various covering providers since PCP left the practice  Always needing refills of meclizine - for dizziness - I can find past dx of BPPV and dizziness with prior neuro consult in chart - neurology referred him to PT  Anxiety managed with buspar BID prn Depression screen [place] 2[place] 01/31/2021 12/13/2020 11/01/2020  Decreased Interest 0 1 0  Down, Depressed, Hopeless 0 - 0  PHQ - 2 Score 0 1 0  Altered sleeping 1 0 0  Tired, decreased energy 0 0 -  Change in appetite 0 0 -  Feeling bad or failure about yourself  0 0 -  Trouble concentrating 0 0 -  Moving slowly or fidgety/restless 0 0 -  Suicidal thoughts 0 0 -  PHQ-9 Score 1 1 0  Difficult doing work/chores Not difficult at all Not difficult at all -  Some recent data might be hidden   GAD 7 : Generalized Anxiety Score 01/31/2021 10/05/2020 05/23/2020 02/03/2020  Nervous, Anxious, on Edge 0 1 0 0  Control/stop worrying 0 0 0 0  Worry too much - different things 0 0 1 0  Trouble relaxing 0 0 0 0  Restless 0 0 0 0  Easily annoyed or irritable 0 0 0 0  Afraid - awful might happen 0 0 0 0  Total GAD 7 Score 0 1 1 0  Anxiety Difficulty Not difficult at all Not difficult at all - -  Sx well controlled  Hx of stroke on plavix - will change to asa On statin, due for lipids Hyperlipidemia: Currently treated with lipitor 20 and omega 3 supplement, pt reports good med compliance Last Lipids: Lab Results  Component Value Date   CHOL 126 11/12/2019   HDL 45 11/12/2019   LDLCALC 64 11/12/2019   TRIG 87 11/12/2019   CHOLHDL 2.8 11/12/2019   - Denies: Chest pain,  shortness of breath, myalgias, claudication  Hypertension:   hx of PVC previously sx but improved with cardizem  BP well controlled today - to a little soft, he denies near syncope BP Readings from Last 3 Encounters:  01/31/21 (!) 104/58  12/13/20 122/76  11/23/20 (!) 150/70   Pt denies CP, SOB, exertional sx, LE edema, palpitation, Ha's, visual disturbances, lightheadedness, hypotension, syncope.      Current Outpatient Medications:    acetaminophen (TYLENOL) 325 MG tablet, Take 325-650 mg by mouth every 6 (six) hours as needed for moderate pain or headache., Disp: , Rfl:    alfuzosin (UROXATRAL) 10 MG 24 hr tablet, Take 1 tablet (10 mg total) by mouth daily with breakfast., Disp: 90 tablet, Rfl: 3   aspirin 81 MG EC tablet, Take 1 tablet (81 mg total) by mouth daily. Swallow whole., Disp: 30 tablet, Rfl: 11   atorvastatin (LIPITOR) 20 MG tablet, TAKE 1 TABLET BY MOUTH EVERYDAY AT BEDTIME, Disp: 90 tablet, Rfl: 3   busPIRone (BUSPAR) 10 MG tablet, TAKE 1 TABLET BY MOUTH TWICE A DAY AS NEEDED, Disp: 180 tablet, Rfl: 0   diltiazem (  CARDIZEM CD) 120 MG 24 hr capsule, TAKE 1 CAPSULE BY MOUTH EVERY DAY, Disp: 90 capsule, Rfl: 0   docusate sodium (COLACE) 100 MG capsule, Take 100 mg by mouth daily as needed for mild constipation., Disp: , Rfl:    finasteride (PROSCAR) 5 MG tablet, Take 1 tablet (5 mg total) by mouth daily., Disp: 90 tablet, Rfl: 3   fluticasone (FLONASE) 50 MCG/ACT nasal spray, PLACE 2 SPRAYS INTO BOTH NOSTRILS DAILY AS NEEDED FOR ALLERGIES OR RHINITIS., Disp: 48 mL, Rfl: 2   levocetirizine (XYZAL) 5 MG tablet, TAKE 1 TABLET BY MOUTH EVERY DAY IN THE EVENING, Disp: 90 tablet, Rfl: 1   meclizine (ANTIVERT) 12.5 MG tablet, Take 1 tablet (12.5 mg total) by mouth daily as needed for dizziness. Use sparingly., Disp: 30 tablet, Rfl: 0   Omega-3 Fatty Acids (FISH OIL) 1200 MG CAPS, Take by mouth., Disp: , Rfl:    Propylene Glycol (SYSTANE BALANCE) 0.6 % SOLN, Place 1 drop into both  eyes 3 (three) times daily as needed (dry eyes)., Disp: , Rfl:   Patient Active Problem List   Diagnosis Date Noted   Elevated hemoglobin ([place]) 01/31/2021   High serum vitamin B12 10/05/2020   Obstructive sleep apnea syndrome 11/12/2019   Anxiety with depression 07/13/2019   Benign localized prostatic hyperplasia with lower urinary tract symptoms (LUTS) 07/13/2019   Recurrent major depressive disorder, in partial remission ([place]) 10/08/2018   Erythrocytosis 01/31/2018   Hx of completed stroke 01/10/2018   Prediabetes 10/07/2017   Left hemiparesis ([place]) 07/03/2017   Dizziness 07/03/2017   Abnormal gait 10/05/2016   Hypoxia, sleep related 04/20/2016   Elevated hematocrit 04/04/2016   Right flank pain 01/10/2016   B12 deficiency 05/24/2015   General unsteadiness 05/24/2015   Allergic rhinitis due to pollen 11/12/2014   Back pain, chronic 10/21/2014   Benign paroxysmal positional nystagmus 10/21/2014   Decreased libido 10/21/2014   Fatigue 10/21/2014   Gastro-esophageal reflux disease without esophagitis 123456   Dysmetabolic syndrome 123456   Fungal infection of nail 10/21/2014   HLD (hyperlipidemia) 08/19/2014   CKD (chronic kidney disease) stage 3, GFR 30-59 ml/min (HCC) 08/19/2014   Class 1 obesity due to excess calories with serious comorbidity and body mass index (BMI) of 30.0 to 30.9 in adult    Asymptomatic PVCs 05/27/2014   Bradycardia 04/05/2014   Essential hypertension 04/05/2014   Osteoarthrosis involving more than one site but not generalized 08/03/2008    Past Surgical History:  Procedure Laterality Date   CATARACT EXTRACTION W/PHACO Left 02/14/2016   Procedure: CATARACT EXTRACTION PHACO AND INTRAOCULAR LENS PLACEMENT ([place]);  Surgeon: Birder Robson, MD;  Location: [facility] ORS;  Service: Ophthalmology;  Laterality: Left;  [place] 53.7 AP% 21.5 CDE 11.58 FLUID PACK LOT # [id number]   CATARACT EXTRACTION W/PHACO Right 12/18/2018   Procedure: CATARACT EXTRACTION  PHACO AND INTRAOCULAR LENS PLACEMENT ([place])  RIGHT, VISION BLUE;  Surgeon: Marchia Meiers, MD;  Location: [facility] OR[facility];  Service: Ophthalmology;  Laterality: Right;  [place]  01:14 CDE 11.82 Fluid pack lot # [id number]   COLONOSCOPY     KNEE ARTHROSCOPY     TRANSURETHRAL RESECTION OF PROSTATE      Family History  Problem Relation Age of Onset   Hypertension Son        Marena Chancy of parents health.   Prostate cancer Neg Hx    Bladder Cancer Neg Hx    Kidney cancer Neg Hx     Social History   Tobacco Use   Smoking  status: Never   Smokeless tobacco: Never   Tobacco comments:    smoking cessation materials not required  Vaping Use   Vaping Use: Never used  Substance Use Topics   Alcohol use: No    Alcohol/week: 0.0 standard drinks   Drug use: No     No Known Allergies  Health Maintenance  Topic Date Due   COVID-19 Vaccine (5 - Booster for Moderna series) 02/16/2021 (Originally 09/13/2020)   Zoster Vaccines- Shingrix (1 of 2) 03/14/2021 (Originally 02/09/1981)   TETANUS/TDAP  12/13/2021 (Originally 02/27/2015)   Pneumonia Vaccine 75+ Years old  Completed   INFLUENZA VACCINE  Completed   HPV VACCINES  Aged Out    Chart Review Today: I personally reviewed active problem list, medication list, allergies, family history, social history, health maintenance, notes from last encounter, lab results, imaging with the patient/caregiver today.   Review of Systems  Constitutional: Negative.   HENT: Negative.    Eyes: Negative.   Respiratory: Negative.    Cardiovascular: Negative.   Gastrointestinal: Negative.   Endocrine: Negative.   Genitourinary: Negative.   Musculoskeletal: Negative.   Skin: Negative.   Allergic/Immunologic: Negative.   Neurological: Negative.   Hematological: Negative.   Psychiatric/Behavioral: Negative.    All other systems reviewed and are negative.   Objective:   Vitals:   01/31/21 1046  BP: (!) 104/58  Pulse: 80  Resp: 16  Temp: 98.4 F (36.9 C)   TempSrc: Oral  SpO2: 93%  Weight: 233 lb 9.6 oz (106 kg)  Height: 6\' 1"  (1.854 m)    Body mass index is 30.82 kg/m.  Physical Exam Vitals and nursing note reviewed.  Constitutional:      General: He is not in acute distress.    Appearance: Normal appearance. He is well-developed. He is obese. He is not ill-appearing, toxic-appearing or diaphoretic.     Interventions: Face mask in place.  HENT:     Head: Normocephalic and atraumatic.     Jaw: No trismus.     Right Ear: External ear normal.     Left Ear: External ear normal.  Eyes:     General: Lids are normal. No scleral icterus.       Right eye: No discharge.        Left eye: No discharge.     Conjunctiva/sclera: Conjunctivae normal.  Neck:     Trachea: Trachea and phonation normal. No tracheal deviation.  Cardiovascular:     Rate and Rhythm: Normal rate and regular rhythm.     Pulses: Normal pulses.          Radial pulses are 2+ on the right side and 2+ on the left side.       Posterior tibial pulses are 2+ on the right side and 2+ on the left side.     Heart sounds: Normal heart sounds. No murmur heard.   No friction rub. No gallop.  Pulmonary:     Effort: Pulmonary effort is normal. No respiratory distress.     Breath sounds: Normal breath sounds. No stridor. No wheezing, rhonchi or rales.  Abdominal:     General: Bowel sounds are normal. There is no distension.     Palpations: Abdomen is soft.  Musculoskeletal:     Right lower leg: No edema.     Left lower leg: No edema.  Skin:    General: Skin is warm and dry.     Coloration: Skin is not jaundiced.     Findings: No rash.  Nails: There is no clubbing.  Neurological:     Mental Status: He is alert. Mental status is at baseline.     Cranial Nerves: No dysarthria or facial asymmetry.     Motor: No tremor or abnormal muscle tone.     Gait: Gait abnormal.     Comments: Walks with cane  Psychiatric:        Mood and Affect: Mood and affect normal.         Speech: Speech normal.        Behavior: Behavior normal. Behavior is cooperative.        Cognition and Memory: Memory is impaired (short term memory).        Assessment & Plan:     ICD-10-CM   1. Essential hypertension  99991111 COMPLETE METABOLIC PANEL WITH GFR   stable, BP at goal today, on exam HR RRR no M, G, R, prior PVC dx, none noted today    2. Left hemiparesis (HCC) Chronic [id number] COMPLETE METABOLIC PANEL WITH GFR    Lipid panel    aspirin 81 MG EC tablet   no noted weakness today, prior neuro consult, continue secondary prevention asa and plavix and adequate BP control    3. Symptomatic PVCs  99991111 COMPLETE METABOLIC PANEL WITH GFR    Lipid panel   well controlled with diltiazem, prior cardiology consult    4. Obstructive sleep apnea syndrome  G47.33 CBC with Differential/Platelet   see below    5. Elevated hemoglobin (HCC)  D58.2 CBC with Differential/Platelet   may need f/up for OSA/CPAP recheck labs today    6. B12 deficiency  E53.8 CBC with Differential/Platelet    Vitamin B12   recheck - may need to restart supplement    7. Stage 3a chronic kidney disease (HCC)  [id number] COMPLETE METABOLIC PANEL WITH GFR    8. Class 1 obesity due to excess calories with serious comorbidity and body mass index (BMI) of 30.0 to 30.9 in adult  [id number] COMPLETE METABOLIC PANEL WITH GFR   Z68.30 Lipid panel    9. Benign paroxysmal positional vertigo, unspecified laterality  H81.10    continue allergy meds, meclizine lower dose sparingly as needed, reviewed med SE with his age, pt verbalized understanding states rarely uses - PT/neuro prn    10. Dizziness  R42    see above    11. Encounter for medication monitoring  Z51.81 CBC with Differential/Platelet    COMPLETE METABOLIC PANEL WITH GFR    Lipid panel    Vitamin B12    aspirin 81 MG EC tablet    12. Encounter for monitoring antiplatelet therapy  Z51.81    Z79.02    change off plavix - see no indication for this long term, start  81 mg asa    13. Anxiety  F41.9 busPIRone (BUSPAR) 10 MG tablet   well controlled with buspar - phq and gad reviewed    14. Rhinosinusitis  J31.0 levocetirizine (XYZAL) 5 MG tablet   J32.9 fluticasone (FLONASE) 50 MCG/ACT nasal spray   continue meds as needed    15. Benign localized prostatic hyperplasia with lower urinary tract symptoms (LUTS)  N40.1    follow up with urology re dribbling sx and meds        Plan as written out for pt:   You can restart a B12 supplement - just take a lower dose than what you were taking last year   Cholesterol - continue atorvastatin and omega 3 supplement  Hypertension /blood pressure  and PVC's - cardizem/diltiazem   Allergies - nasal and ear symptoms - can continue allergy meds including xyzal and flonase  Anxiety/moods - can continue to take buspar up to twice a day  Dizziness/vertigo - can continue to take meclizine as needed for dizzy episodes - but you NEED to get checked out or come in here if it ever gets suddenly worse or more frequent  History of stoke - continue to take lipitor and take 81 mg aspirin once a day to prevent another stroke  OSA - obstructive sleep apnea - you may need a follow up sleep study to check on this and see if you still need a CPAP machine  Return in about 3 months (around 05/03/2021) for med change fup and est with new pcp .   Delsa Grana, PA-C 01/31/21 11:11 AM

## 2021-02-01 LAB — CBC WITH DIFFERENTIAL/PLATELET
Absolute Monocytes: 628 cells/uL (ref 200–950)
Basophils Absolute: 29 cells/uL (ref 0–200)
Basophils Relative: 0.4 %
Eosinophils Absolute: 80 cells/uL (ref 15–500)
Eosinophils Relative: 1.1 %
HCT: 50 % (ref 38.5–50.0)
Hemoglobin: 17.5 g/dL — ABNORMAL HIGH (ref 13.2–17.1)
Lymphs Abs: 1146 cells/uL (ref 850–3900)
MCH: 32 pg (ref 27.0–33.0)
MCHC: 35 g/dL (ref 32.0–36.0)
MCV: 91.4 fL (ref 80.0–100.0)
MPV: 11.1 fL (ref 7.5–12.5)
Monocytes Relative: 8.6 %
Neutro Abs: 5417 cells/uL (ref 1500–7800)
Neutrophils Relative %: 74.2 %
Platelets: 217 10*3/uL (ref 140–400)
RBC: 5.47 10*6/uL (ref 4.20–5.80)
RDW: 12.5 % (ref 11.0–15.0)
Total Lymphocyte: 15.7 %
WBC: 7.3 10*3/uL (ref 3.8–10.8)

## 2021-02-01 LAB — LIPID PANEL
Cholesterol: 121 mg/dL (ref <200)
HDL: 46 mg/dL (ref >=40)
LDL Cholesterol (Calc): 61 mg/dL (calc)
Non-HDL Cholesterol (Calc): 75 mg/dL (calc) (ref <130)
Total CHOL/HDL Ratio: 2.6 (calc) (ref <5.0)
Triglycerides: 61 mg/dL (ref <150)

## 2021-02-01 LAB — COMPLETE METABOLIC PANEL WITH GFR
AG Ratio: 1.9 (calc) (ref 1.0–2.5)
ALT: 10 U/L (ref 9–46)
AST: 11 U/L (ref 10–35)
Albumin: 4.3 g/dL (ref 3.6–5.1)
Alkaline phosphatase (APISO): 47 U/L (ref 35–144)
BUN: 14 mg/dL (ref 7–25)
CO2: 24 mmol/L (ref 20–32)
Calcium: 9.4 mg/dL (ref 8.6–10.3)
Chloride: 104 mmol/L (ref 98–110)
Creat: 1.11 mg/dL (ref 0.70–1.22)
Globulin: 2.3 g/dL (calc) (ref 1.9–3.7)
Glucose, Bld: 85 mg/dL (ref 65–99)
Potassium: 3.9 mmol/L (ref 3.5–5.3)
Sodium: 138 mmol/L (ref 135–146)
Total Bilirubin: 1.7 mg/dL — ABNORMAL HIGH (ref 0.2–1.2)
Total Protein: 6.6 g/dL (ref 6.1–8.1)
eGFR: 63 mL/min/{1.73_m2} (ref >=60)

## 2021-02-01 LAB — VITAMIN B12: Vitamin B-12: 456 pg/mL (ref 200–1100)

## 2021-02-02 ENCOUNTER — Other Ambulatory Visit: Payer: Self-pay | Admitting: *Deleted

## 2021-02-02 DIAGNOSIS — D751 Secondary polycythemia: Secondary | ICD-10-CM

## 2021-02-08 ENCOUNTER — Encounter: Payer: Self-pay | Admitting: Internal Medicine

## 2021-02-08 ENCOUNTER — Other Ambulatory Visit: Payer: Self-pay

## 2021-02-08 ENCOUNTER — Inpatient Hospital Stay: Payer: PPO

## 2021-02-08 ENCOUNTER — Inpatient Hospital Stay: Payer: PPO | Attending: Internal Medicine

## 2021-02-08 ENCOUNTER — Inpatient Hospital Stay (HOSPITAL_BASED_OUTPATIENT_CLINIC_OR_DEPARTMENT_OTHER): Payer: PPO | Admitting: Internal Medicine

## 2021-02-08 DIAGNOSIS — D751 Secondary polycythemia: Secondary | ICD-10-CM | POA: Insufficient documentation

## 2021-02-08 DIAGNOSIS — Z8673 Personal history of transient ischemic attack (TIA), and cerebral infarction without residual deficits: Secondary | ICD-10-CM | POA: Diagnosis not present

## 2021-02-08 DIAGNOSIS — I10 Essential (primary) hypertension: Secondary | ICD-10-CM | POA: Diagnosis not present

## 2021-02-08 LAB — BASIC METABOLIC PANEL
Anion gap: 9 (ref 5–15)
BUN: 14 mg/dL (ref 8–23)
CO2: 24 mmol/L (ref 22–32)
Calcium: 8.7 mg/dL — ABNORMAL LOW (ref 8.9–10.3)
Chloride: 103 mmol/L (ref 98–111)
Creatinine, Ser: 1.15 mg/dL (ref 0.61–1.24)
GFR, Estimated: >60 mL/min (ref >60)
Glucose, Bld: 118 mg/dL — ABNORMAL HIGH (ref 70–99)
Potassium: 3.6 mmol/L (ref 3.5–5.1)
Sodium: 136 mmol/L (ref 135–145)

## 2021-02-08 LAB — CBC WITH DIFFERENTIAL/PLATELET
Abs Immature Granulocytes: 0.02 10*3/uL (ref 0.00–0.07)
Basophils Absolute: 0.1 10*3/uL (ref 0.0–0.1)
Basophils Relative: 1 %
Eosinophils Absolute: 0.2 10*3/uL (ref 0.0–0.5)
Eosinophils Relative: 2 %
HCT: 49.5 % (ref 39.0–52.0)
Hemoglobin: 17.4 g/dL — ABNORMAL HIGH (ref 13.0–17.0)
Immature Granulocytes: 0 %
Lymphocytes Relative: 26 %
Lymphs Abs: 1.7 10*3/uL (ref 0.7–4.0)
MCH: 31.6 pg (ref 26.0–34.0)
MCHC: 35.2 g/dL (ref 30.0–36.0)
MCV: 90 fL (ref 80.0–100.0)
Monocytes Absolute: 0.6 10*3/uL (ref 0.1–1.0)
Monocytes Relative: 10 %
Neutro Abs: 4 10*3/uL (ref 1.7–7.7)
Neutrophils Relative %: 61 %
Platelets: 196 10*3/uL (ref 150–400)
RBC: 5.5 MIL/uL (ref 4.22–5.81)
RDW: 12.2 % (ref 11.5–15.5)
WBC: 6.5 10*3/uL (ref 4.0–10.5)
nRBC: 0 % (ref 0.0–0.2)

## 2021-02-08 NOTE — Assessment & Plan Note (Addendum)
#   Erythrocytosis isolated-unclear etiology/however SECONDARY -negative Jak 2/normal erythropoietin levels.Today hemoglobin 17; hematocrit  49; patient is asymptomatic hold off phlebotomy.  Would recommend phlebotomy if hematocrit greater than 54.   # HTN- STABLE.  # #Since patient is clinically stable I think is reasonable for the patient to follow-up with PCP/can follow-up with [place] as needed.  At any point of time if hematocrit is greater than 54 recommend reevaluation at the cancer center for possible phlebotomy.  Patient comfortable with the plan; to call us if any questions or concerns in the interim.  # DISPOSITION: # NO phlebotomy # follow up as needed-Dr.B

## 2021-02-08 NOTE — Progress Notes (Signed)
[facility] CONSULT NOTE  Patient Care Team: Berniece Salines, [name] as PCP - General (Nurse Practitioner) Earna Coder, MD as Consulting Physician (Hematology and Oncology) Hulan Fray as Physician Assistant (Urology) Galen Manila, MD as Referring Physician (Ophthalmology)  CHIEF COMPLAINTS/PURPOSE OF CONSULTATION: Erythrocytosis  # NOV 2019- SECONDARY Erythrocytosis [negative Jak 2/normal erythropoietin levels; ER/dizzyness.] s/p Phlebotomy x1; on surveillaince  # Hx of mini-stroke/ chronic intermittent mild elevation of bilirubin-question Gilbert's syndrome.  Oncology History   No history exists.     HISTORY OF PRESENTING ILLNESS:  pt in wheel chair today; usually walk with cane.Alone.   Jeffery Campbell 87 y.o.  male is here for follow-up with likely secondary erythrocytosis is here for follow-up.  Patient has not had any recent hospitalizations. Patient denies any worsening shortness of breath or cough.  Denies any fevers or chills.  Denies any headaches.  No strokes.   Review of Systems  Constitutional:  Negative for chills, diaphoresis, fever, malaise/fatigue and weight loss.  HENT:  Negative for nosebleeds and sore throat.   Eyes:  Negative for double vision.  Respiratory:  Negative for cough, hemoptysis, sputum production, shortness of breath and wheezing.   Cardiovascular:  Negative for chest pain, palpitations, orthopnea and leg swelling.  Gastrointestinal:  Negative for abdominal pain, blood in stool, constipation, diarrhea, heartburn, melena, nausea and vomiting.  Genitourinary:  Negative for dysuria, frequency and urgency.  Musculoskeletal:  Negative for back pain and joint pain.  Skin: Negative.  Negative for itching and rash.  Neurological:  Negative for tingling, focal weakness, weakness and headaches.  Endo/Heme/Allergies:  Does not bruise/bleed easily.  Psychiatric/Behavioral:  Negative for depression. The patient is not  nervous/anxious and does not have insomnia.     MEDICAL HISTORY:  Past Medical History:  Diagnosis Date   Arrhythmia    Cough    CHRONIC   Depression    Disturbance of sleep    Diverticulitis    GERD (gastroesophageal reflux disease)    Hemorrhoids    Hypertension    OA (osteoarthritis)    Pure hypercholesterolemia    Sleep apnea    Stroke [facility])     SURGICAL HISTORY: Past Surgical History:  Procedure Laterality Date   CATARACT EXTRACTION W/PHACO Left 02/14/2016   Procedure: CATARACT EXTRACTION PHACO AND INTRAOCULAR LENS PLACEMENT (IOC);  Surgeon: Galen Manila, MD;  Location: [facility] ORS;  Service: Ophthalmology;  Laterality: Left;  [place] 53.7 AP% 21.5 CDE 11.58 FLUID PACK LOT # [id number]   CATARACT EXTRACTION W/PHACO Right 12/18/2018   Procedure: CATARACT EXTRACTION PHACO AND INTRAOCULAR LENS PLACEMENT (IOC)  RIGHT, VISION BLUE;  Surgeon: Elliot Cousin, MD;  Location: [facility] ORS;  Service: Ophthalmology;  Laterality: Right;  [place]  01:14 CDE 11.82 Fluid pack lot # [id number]   COLONOSCOPY     KNEE ARTHROSCOPY     TRANSURETHRAL RESECTION OF PROSTATE      SOCIAL HISTORY: Social History   Socioeconomic History   Marital status: Divorced    Spouse name: Not on file   Number of children: 3   Years of education: Not on file   Highest education level: 11th grade  Occupational History   Occupation: Retired  Tobacco Use   Smoking status: Never   Smokeless tobacco: Never   Tobacco comments:    smoking cessation materials not required  Vaping Use   Vaping Use: Never used  Substance and Sexual Activity   Alcohol use: No    Alcohol/week: 0.0 standard drinks  Drug use: No   Sexual activity: Not Currently  Other Topics Concern   Not on file  Social History Narrative   Lives at home alone.   Writes left-handed - does everything else with right-hand.   No caffeine use.      Never smoker; never alcohol; used to be truck driver; retired part time from Office manager course. Self; with  grand-son.    Social Determinants of Health   Financial Resource Strain: Low Risk    Difficulty of Paying Living Expenses: Not hard at all  Food Insecurity: No Food Insecurity   Worried About Charity fundraiser in the Last Year: Never true   [place] in the Last Year: Never true  Transportation Needs: No Transportation Needs   Lack of Transportation (Medical): No   Lack of Transportation (Non-Medical): No  Physical Activity: Inactive   Days of Exercise per Week: 0 days   Minutes of Exercise per Session: 0 min  Stress: No Stress Concern Present   Feeling of Stress : Not at all  Social Connections: Moderately Isolated   Frequency of Communication with Friends and Family: More than three times a week   Frequency of Social Gatherings with Friends and Family: Twice a week   Attends Religious Services: More than 4 times per year   Active Member of [employer] or Organizations: No   Attends Archivist Meetings: Never   Marital Status: Divorced  Human resources officer Violence: Not At Risk   Fear of Current or Ex-Partner: No   Emotionally Abused: No   Physically Abused: No   Sexually Abused: No    FAMILY HISTORY: Family History  Problem Relation Age of Onset   Hypertension Son        Marena Chancy of parents health.   Prostate cancer Neg Hx    Bladder Cancer Neg Hx    Kidney cancer Neg Hx     ALLERGIES:  has No Known Allergies.  MEDICATIONS:  Current Outpatient Medications  Medication Sig Dispense Refill   acetaminophen (TYLENOL) 325 MG tablet Take 325-650 mg by mouth every 6 (six) hours as needed for moderate pain or headache.     alfuzosin (UROXATRAL) 10 MG 24 hr tablet Take 1 tablet (10 mg total) by mouth daily with breakfast. 90 tablet 3   aspirin 81 MG EC tablet Take 1 tablet (81 mg total) by mouth daily. Swallow whole. 30 tablet 11   atorvastatin (LIPITOR) 20 MG tablet TAKE 1 TABLET BY MOUTH EVERYDAY AT BEDTIME 90 tablet 3   busPIRone (BUSPAR) 10 MG tablet Take 1 tablet  (10 mg total) by mouth 2 (two) times daily as needed (for anxiety). 180 tablet 3   diltiazem (CARDIZEM CD) 120 MG 24 hr capsule TAKE 1 CAPSULE BY MOUTH EVERY DAY 90 capsule 0   finasteride (PROSCAR) 5 MG tablet Take 1 tablet (5 mg total) by mouth daily. 90 tablet 3   fluticasone (FLONASE) 50 MCG/ACT nasal spray Place 2 sprays into both nostrils daily as needed for allergies or rhinitis. 48 mL 2   levocetirizine (XYZAL) 5 MG tablet TAKE 1 TABLET BY MOUTH EVERY DAY IN THE EVENING 90 tablet 1   meclizine (ANTIVERT) 12.5 MG tablet Take 1 tablet (12.5 mg total) by mouth daily as needed for dizziness (vertigo episodes). Use sparingly.  Must get appointment if using daily or symptoms worsen 30 tablet 0   Omega-3 Fatty Acids (FISH OIL) 1200 MG CAPS Take 2 capsules (2,400 mg total) by mouth in  the morning and at bedtime.     Propylene Glycol (SYSTANE BALANCE) 0.6 % SOLN Place 1 drop into both eyes 3 (three) times daily as needed (dry eyes).     No current facility-administered medications for this visit.      [name]  PHYSICAL EXAMINATION: ECOG PERFORMANCE STATUS: 1 - Symptomatic but completely ambulatory  Vitals:   02/08/21 1322  BP: 123/63  Pulse: 70  Resp: (!) 22  Temp: (!) 96.9 F (36.1 C)   Filed Weights   02/08/21 1322  Weight: 231 lb (104.8 kg)    Physical Exam HENT:     Head: Normocephalic and atraumatic.     Mouth/Throat:     Pharynx: No oropharyngeal exudate.  Eyes:     Pupils: Pupils are equal, round, and reactive to light.  Cardiovascular:     Rate and Rhythm: Normal rate and regular rhythm.  Pulmonary:     Effort: No respiratory distress.     Breath sounds: No wheezing.  Abdominal:     General: Bowel sounds are normal. There is no distension.     Palpations: Abdomen is soft. There is no mass.     Tenderness: There is no abdominal tenderness. There is no guarding or rebound.  Musculoskeletal:        General: No tenderness. Normal range of motion.     Cervical back:  Normal range of motion and neck supple.  Skin:    General: Skin is warm.  Neurological:     Mental Status: He is alert and oriented to person, place, and time.  Psychiatric:        Mood and Affect: Affect normal.     LABORATORY DATA:  I have reviewed the data as listed Lab Results  Component Value Date   WBC 6.5 02/08/2021   HGB 17.4 (H) 02/08/2021   HCT 49.5 02/08/2021   MCV 90.0 02/08/2021   PLT 196 02/08/2021   Recent Labs    02/10/20 1259 05/23/20 1120 10/05/20 1414 01/31/21 1140 02/08/21 1229  NA 139   < > 139 138 136  K 4.2   < > 3.9 3.9 3.6  CL 105   < > 104 104 103  CO2 25   < > 25 24 24   GLUCOSE 97   < > 94 85 118*  BUN 15   < > 13 14 14   CREATININE 1.21   < > 1.06 1.11 1.15  CALCIUM 9.0   < > 9.5 9.4 8.7*  GFRNONAA 57*  --   --   --  >60  PROT  --   --   --  6.6  --   AST  --   --   --  11  --   ALT  --   --   --  10  --   BILITOT  --   --   --  1.7*  --    < > = values in this interval not displayed.    RADIOGRAPHIC STUDIES: I have personally reviewed the radiological images as listed and agreed with the findings in the report. No results found.  ASSESSMENT & PLAN:   Erythrocytosis # Erythrocytosis isolated-unclear etiology/however SECONDARY -negative Jak 2/normal erythropoietin levels.Today hemoglobin 17; hematocrit  49; patient is asymptomatic hold off phlebotomy.  Would recommend phlebotomy if hematocrit greater than 54.   # HTN- STABLE.  # #Since patient is clinically stable I think is reasonable for the patient to follow-up with PCP/can follow-up with [place] as  needed.  At any point of time if hematocrit is greater than 54 recommend reevaluation at the cancer center for possible phlebotomy.  Patient comfortable with the plan; to call us if any questions or concerns in the interim.  # DISPOSITION: # NO phlebotomy # follow up as needed-Dr.B  All questions were answered. The patient knows to call the clinic with any problems, questions or  concerns.   Cammie Sickle, MD 02/08/2021 1:46 PM

## 2021-02-27 DIAGNOSIS — H04123 Dry eye syndrome of bilateral lacrimal glands: Secondary | ICD-10-CM | POA: Diagnosis not present

## 2021-02-28 DIAGNOSIS — S0501XA Injury of conjunctiva and corneal abrasion without foreign body, right eye, initial encounter: Secondary | ICD-10-CM | POA: Diagnosis not present

## 2021-03-03 DIAGNOSIS — S0501XD Injury of conjunctiva and corneal abrasion without foreign body, right eye, subsequent encounter: Secondary | ICD-10-CM | POA: Diagnosis not present

## 2021-03-11 DIAGNOSIS — Z8669 Personal history of other diseases of the nervous system and sense organs: Secondary | ICD-10-CM | POA: Diagnosis not present

## 2021-03-11 DIAGNOSIS — Z09 Encounter for follow-up examination after completed treatment for conditions other than malignant neoplasm: Secondary | ICD-10-CM | POA: Diagnosis not present

## 2021-03-11 DIAGNOSIS — R42 Dizziness and giddiness: Secondary | ICD-10-CM | POA: Diagnosis not present

## 2021-03-14 ENCOUNTER — Other Ambulatory Visit: Payer: Self-pay | Admitting: Family Medicine

## 2021-03-14 NOTE — Telephone Encounter (Signed)
Requested Prescriptions  Pending Prescriptions Disp Refills   diltiazem (CARDIZEM CD) 120 MG 24 hr capsule [Pharmacy Med Name: DILTIAZEM 24H ER(CD) 120 MG CP] 90 capsule 1    Sig: TAKE 1 CAPSULE BY MOUTH EVERY DAY     Cardiovascular:  Calcium Channel Blockers Passed - 03/14/2021  5:30 PM      Passed - Last BP in normal range    BP Readings from Last 1 Encounters:  02/08/21 123/63         Passed - Valid encounter within last 6 months    Recent Outpatient Visits          1 month ago Essential hypertension   [facility] Danelle Berry, PA-C   3 months ago Urinary frequency   [facility] Berniece Salines, [name]   5 months ago Essential hypertension   [facility] [place] Dense M, DO   9 months ago Essential hypertension   [facility] [place] Dense M, DO   11 months ago Erythrocytosis   [facility] Jamelle Haring, MD      Future Appointments            In 1 month Zane Herald, Rudolpho Sevin, [name] [facility]   In 7 months  [facility]   In 8 months [name], Elana [facility]

## 2021-03-20 DIAGNOSIS — R42 Dizziness and giddiness: Secondary | ICD-10-CM | POA: Diagnosis not present

## 2021-04-03 ENCOUNTER — Other Ambulatory Visit: Payer: Self-pay | Admitting: Family Medicine

## 2021-04-03 DIAGNOSIS — R42 Dizziness and giddiness: Secondary | ICD-10-CM

## 2021-04-18 ENCOUNTER — Other Ambulatory Visit: Payer: Self-pay

## 2021-04-18 ENCOUNTER — Ambulatory Visit (INDEPENDENT_AMBULATORY_CARE_PROVIDER_SITE_OTHER): Payer: PPO | Admitting: Nurse Practitioner

## 2021-04-18 ENCOUNTER — Encounter: Payer: Self-pay | Admitting: Nurse Practitioner

## 2021-04-18 VITALS — BP 138/76 | HR 95 | Temp 98.1°F | Resp 16 | Ht 71.0 in | Wt 229.7 lb

## 2021-04-18 DIAGNOSIS — R6883 Chills (without fever): Secondary | ICD-10-CM | POA: Diagnosis not present

## 2021-04-18 DIAGNOSIS — N3000 Acute cystitis without hematuria: Secondary | ICD-10-CM | POA: Diagnosis not present

## 2021-04-18 LAB — POCT URINALYSIS DIPSTICK
Bilirubin, UA: NEGATIVE
Glucose, UA: NEGATIVE
Ketones, UA: POSITIVE
Nitrite, UA: POSITIVE
Protein, UA: POSITIVE — AB
Spec Grav, UA: 1.02 (ref 1.010–1.025)
Urobilinogen, UA: 0.2 E.U./dL
pH, UA: 5 (ref 5.0–8.0)

## 2021-04-18 MED ORDER — CEPHALEXIN 500 MG PO CAPS: 500.0000 mg | ORAL_CAPSULE | Freq: Two times a day (BID) | ORAL | 0 refills | Status: AC

## 2021-04-18 NOTE — Progress Notes (Signed)
BP 138/76    Pulse 95    Temp 98.1 F (36.7 C) (Oral)    Resp 16    Ht 5\' 11"  (1.803 m)    Wt 229 lb 11.2 oz (104.2 kg)    SpO2 98%    BMI 32.04 kg/m    Subjective:    Patient ID: Jeffery Campbell, male    DOB: 1928/05/30, 88 y.o.   MRN: [id number]  HPI: Jeffery Campbell is a 88 y.o. male  Chief Complaint  Patient presents with   Chills    Through out the day but worse at night    Chills: He says that he has had chills at night for about week.  He denies any fever, cold symptoms, urinary complaints or bloody stools.  He denies any chest pain, shortness of breath or dizziness. He says the chills are worse at night.  He says that he does have the heat on in his house and he uses an electric blanket.  Vital signs are normal and physical exam is benign.  Will get labs and urine.    UTI: He denies any dysuria, hematuria, back pain, fever. He does report chills. Got a urine dip and it was positive for blood, leukocytes and nitrates.  Sending in for culture, will treat with antibiotic.   Relevant past medical, surgical, family and social history reviewed and updated as indicated. Interim medical history since our last visit reviewed. Allergies and medications reviewed and updated.  Review of Systems  Constitutional: Negative for fever or weight change. Positive for chills Respiratory: Negative for cough and shortness of breath.   Cardiovascular: Negative for chest pain or palpitations.  Gastrointestinal: Negative for abdominal pain, no bowel changes.  Musculoskeletal: Negative for gait problem or joint swelling.  Skin: Negative for rash.  Neurological: Negative for dizziness or headache.  No other specific complaints in a complete review of systems (except as listed in HPI above).      Objective:    BP 138/76    Pulse 95    Temp 98.1 F (36.7 C) (Oral)    Resp 16    Ht 5\' 11"  (1.803 m)    Wt 229 lb 11.2 oz (104.2 kg)    SpO2 98%    BMI 32.04 kg/m   Wt Readings from Last 3 Encounters:   04/18/21 229 lb 11.2 oz (104.2 kg)  02/08/21 231 lb (104.8 kg)  01/31/21 233 lb 9.6 oz (106 kg)    Physical Exam  Constitutional: Patient appears well-developed and well-nourished. Obese No distress.  HEENT: head atraumatic, normocephalic, pupils equal and reactive to light, ears TMs clear, neck supple, throat within normal limits Cardiovascular: Normal rate, regular rhythm and normal heart sounds.  No murmur heard. No BLE edema. Pulmonary/Chest: Effort normal and breath sounds normal. No respiratory distress. Abdominal: Soft.  There is no tenderness. Psychiatric: Patient has a normal mood and affect. behavior is normal. Judgment and thought content normal.   Results for orders placed or performed in visit on [id number]  Basic metabolic panel  Result Value Ref Range   Sodium 136 135 - 145 mmol/L   Potassium 3.6 3.5 - 5.1 mmol/L   Chloride 103 98 - 111 mmol/L   CO2 24 22 - 32 mmol/L   Glucose, Bld 118 (H) 70 - 99 mg/dL   BUN 14 8 - 23 mg/dL   Creatinine, Ser 1.15 0.61 - 1.24 mg/dL   Calcium 8.7 (L) 8.9 - 10.3 mg/dL   GFR, Estimated >60 >  60 mL/min   Anion gap 9 5 - 15  CBC with Differential  Result Value Ref Range   WBC 6.5 4.0 - 10.5 K/uL   RBC 5.50 4.22 - 5.81 MIL/uL   Hemoglobin 17.4 (H) 13.0 - 17.0 g/dL   HCT 49.5 39.0 - 52.0 %   MCV 90.0 80.0 - 100.0 fL   MCH 31.6 26.0 - 34.0 pg   MCHC 35.2 30.0 - 36.0 g/dL   RDW 12.2 11.5 - 15.5 %   Platelets 196 150 - 400 K/uL   nRBC 0.0 0.0 - 0.2 %   Neutrophils Relative % 61 %   Neutro Abs 4.0 1.7 - 7.7 K/uL   Lymphocytes Relative 26 %   Lymphs Abs 1.7 0.7 - 4.0 K/uL   Monocytes Relative 10 %   Monocytes Absolute 0.6 0.1 - 1.0 K/uL   Eosinophils Relative 2 %   Eosinophils Absolute 0.2 0.0 - 0.5 K/uL   Basophils Relative 1 %   Basophils Absolute 0.1 0.0 - 0.1 K/uL   Immature Granulocytes 0 %   Abs Immature Granulocytes 0.02 0.00 - 0.07 K/uL      Assessment & Plan:   1. Chills  - CBC with Differential/Platelet - COMPLETE  METABOLIC PANEL WITH GFR - TSH - POCT urinalysis dipstick - Urine Culture  2. Acute cystitis without hematuria  - Urine Culture - cephALEXin (KEFLEX) 500 MG capsule; Take 1 capsule (500 mg total) by mouth 2 (two) times daily for 7 days.  Dispense: 14 capsule; Refill: 0   Follow up plan: Return if symptoms worsen or fail to improve.

## 2021-04-21 LAB — CBC WITH DIFFERENTIAL/PLATELET
Absolute Monocytes: 585 cells/uL (ref 200–950)
Basophils Absolute: 39 cells/uL (ref 0–200)
Basophils Relative: 0.6 %
Eosinophils Absolute: 59 cells/uL (ref 15–500)
Eosinophils Relative: 0.9 %
HCT: 51.8 % — ABNORMAL HIGH (ref 38.5–50.0)
Hemoglobin: 17.9 g/dL — ABNORMAL HIGH (ref 13.2–17.1)
Lymphs Abs: 1125 cells/uL (ref 850–3900)
MCH: 31.4 pg (ref 27.0–33.0)
MCHC: 34.6 g/dL (ref 32.0–36.0)
MCV: 90.9 fL (ref 80.0–100.0)
MPV: 11.1 fL (ref 7.5–12.5)
Monocytes Relative: 9 %
Neutro Abs: 4693 cells/uL (ref 1500–7800)
Neutrophils Relative %: 72.2 %
Platelets: 228 10*3/uL (ref 140–400)
RBC: 5.7 10*6/uL (ref 4.20–5.80)
RDW: 12.4 % (ref 11.0–15.0)
Total Lymphocyte: 17.3 %
WBC: 6.5 10*3/uL (ref 3.8–10.8)

## 2021-04-21 LAB — URINE CULTURE
MICRO NUMBER:: 12880765
SPECIMEN QUALITY:: ADEQUATE

## 2021-04-21 LAB — COMPLETE METABOLIC PANEL WITH GFR
AG Ratio: 1.6 (calc) (ref 1.0–2.5)
ALT: 6 U/L — ABNORMAL LOW (ref 9–46)
AST: 9 U/L — ABNORMAL LOW (ref 10–35)
Albumin: 4.1 g/dL (ref 3.6–5.1)
Alkaline phosphatase (APISO): 43 U/L (ref 35–144)
BUN: 16 mg/dL (ref 7–25)
CO2: 27 mmol/L (ref 20–32)
Calcium: 9.5 mg/dL (ref 8.6–10.3)
Chloride: 105 mmol/L (ref 98–110)
Creat: 1.15 mg/dL (ref 0.70–1.22)
Globulin: 2.6 g/dL (calc) (ref 1.9–3.7)
Glucose, Bld: 96 mg/dL (ref 65–99)
Potassium: 4.1 mmol/L (ref 3.5–5.3)
Sodium: 139 mmol/L (ref 135–146)
Total Bilirubin: 1.5 mg/dL — ABNORMAL HIGH (ref 0.2–1.2)
Total Protein: 6.7 g/dL (ref 6.1–8.1)
eGFR: 60 mL/min/{1.73_m2} (ref >=60)

## 2021-04-21 LAB — TSH: TSH: 3.24 mIU/L (ref 0.40–4.50)

## 2021-04-27 ENCOUNTER — Ambulatory Visit: Payer: Self-pay | Admitting: *Deleted

## 2021-04-27 NOTE — Telephone Encounter (Signed)
Summary: question re Keflex refill request cb around 11:00   cephALEXin (KEFLEX) 500 MG capsule [[id number]]  ENDED    Order Details  Dose: 500 mg Route: Oral Frequency: 2 times daily  Dispense Quantity: 14 capsule Refills: 0     Sig: Take 1 capsule (500 mg total) by mouth 2 (two) times daily for 7 days.   Pt has called in and feels much better and really likes this med. He says sometime if feels a little like symptoms could come back and is wanting another bottle to have. Pls call and advise pt about med. He states he is going out and will be back before 11:00 please fu shortly after or he says he is going to cb wanting it today! [phone]      Answer Assessment - Initial Assessment Questions 1. DRUG NAME: "What medicine do you need to have refilled?"     Cephalexin 500 mg 2. REFILLS REMAINING: "How many refills are remaining?" (Note: The label on the medicine or pill bottle will show how many refills are remaining. If there are no refills remaining, then a renewal may be needed.)     none 3. EXPIRATION DATE: "What is the expiration date?" (Note: The label states when the prescription will expire, and thus can no longer be refilled.)     na 4. PRESCRIBING HCP: "Who prescribed it?" Reason: If prescribed by specialist, call should be referred to that group.     PCP 5. SYMPTOMS: "Do you have any symptoms?"     none 6. PREGNANCY: "Is there any chance that you are pregnant?" "When was your last menstrual period?"     *No Answer*  Protocols used: Medication Refill and Renewal Call-A-AH

## 2021-04-27 NOTE — Telephone Encounter (Signed)
Called patient NA. Patient will need to discuss this  with provider if still having issues

## 2021-04-27 NOTE — Telephone Encounter (Signed)
°  Chief Complaint: medication refill Symptoms: none at this time Frequency: na Pertinent Negatives: Patient denies urinary symptoms Disposition: [] ED /[] Urgent Care (no appt availability in office) / [] Appointment(In office/virtual)/ []  [place] [facility]/ [x] Home Care/ [] Refused Recommended Disposition /[] [place] [facility]/ []  Follow-up with PCP Additional Notes: Patient reports the medication worked well. He thought he may be getting UTI symptoms the other day- but they subsided and he feels fine now. Patient is requesting a RF of antibiotic to have on hand in case he needs it. Patient advised normally do not RF those medications- but will send request for review.

## 2021-05-01 ENCOUNTER — Telehealth: Payer: Self-pay

## 2021-05-01 NOTE — Telephone Encounter (Signed)
Pt wants to receive a refill of this   [facility] [address], [place] - [address]  [address] [place] [address]  Phone: [phone] Fax: [phone]

## 2021-05-01 NOTE — Telephone Encounter (Signed)
Copied from Campbell [id number]. Topic: General - Other >> May 01, 2021  1:42 PM McGill, Nelva Bush wrote: Reason for CRM: Pt requesting call back. Pt missed a call from Hollie Salk, Tibbie and would like to discuss his medication cephALEXin (KEFLEX) 500 MG capsule

## 2021-05-03 ENCOUNTER — Ambulatory Visit (INDEPENDENT_AMBULATORY_CARE_PROVIDER_SITE_OTHER): Payer: PPO | Admitting: Nurse Practitioner

## 2021-05-03 ENCOUNTER — Other Ambulatory Visit: Payer: Self-pay

## 2021-05-03 ENCOUNTER — Encounter: Payer: Self-pay | Admitting: Nurse Practitioner

## 2021-05-03 VITALS — BP 148/76 | HR 83 | Temp 97.8°F | Resp 16 | Ht 73.0 in | Wt 230.4 lb

## 2021-05-03 DIAGNOSIS — F3341 Major depressive disorder, recurrent, in partial remission: Secondary | ICD-10-CM

## 2021-05-03 DIAGNOSIS — N182 Chronic kidney disease, stage 2 (mild): Secondary | ICD-10-CM

## 2021-05-03 DIAGNOSIS — Z6830 Body mass index (BMI) 30.0-30.9, adult: Secondary | ICD-10-CM | POA: Diagnosis not present

## 2021-05-03 DIAGNOSIS — H8110 Benign paroxysmal vertigo, unspecified ear: Secondary | ICD-10-CM | POA: Diagnosis not present

## 2021-05-03 DIAGNOSIS — F419 Anxiety disorder, unspecified: Secondary | ICD-10-CM

## 2021-05-03 DIAGNOSIS — I10 Essential (primary) hypertension: Secondary | ICD-10-CM | POA: Diagnosis not present

## 2021-05-03 DIAGNOSIS — E782 Mixed hyperlipidemia: Secondary | ICD-10-CM

## 2021-05-03 DIAGNOSIS — K219 Gastro-esophageal reflux disease without esophagitis: Secondary | ICD-10-CM

## 2021-05-03 DIAGNOSIS — E6609 Other obesity due to excess calories: Secondary | ICD-10-CM

## 2021-05-03 DIAGNOSIS — R6883 Chills (without fever): Secondary | ICD-10-CM | POA: Diagnosis not present

## 2021-05-03 NOTE — Telephone Encounter (Signed)
Patient came in for appointment.  

## 2021-05-03 NOTE — Progress Notes (Signed)
BP (!) 148/76    Pulse 83    Temp 97.8 F (36.6 C) (Oral)    Resp 16    Ht '6\' 1"'  (1.854 m)    Wt 230 lb 6.4 oz (104.5 kg)    SpO2 96%    BMI 30.40 kg/m    Subjective:    Patient ID: Jeffery Campbell, male    DOB: 10/31/1928, 88 y.o.   MRN: [id number]  HPI: Jeffery Campbell is a 88 y.o. male  Chief Complaint  Patient presents with   Hypertension   Hyperlipidemia   Medication Problem   HTN: He does not check his blood pressure at home. He says he takes his medications every day. His medication includes diltiazem 120 mg daily.  His blood pressure was 152/76 when he came in today, recheck was  148/76 . He says he was upset at the man that fixed his car because it was messing up again. He said that is probably why his blood pressure was up.  Hyperlipidemia: He takes his atorvastatin 20 mg daily. He denies any myalgia. His las LDL was 61 on 01/31/2021. Will recheck next time.   GERD: He says he is not having any problems with acid reflux.  He does not currently take any medication for this. Will continue to monitor.   Obesity:  His current weight is 230 lbs, with a BMI of 30.40. Unchanged.   CKDII: His last eGFR was 60 on 04/18/2021.  Stable.  Anxiety/depression:PHQ9 and GAD are negative. He says his mood is good except for the car guy not fixing his car right.  He currently takes buspar 10 mg two times a day as needed. He says he is doing pretty good right now.  Depression screen [facility] 2/9 05/03/2021 04/18/2021 01/31/2021 12/13/2020 11/01/2020  Decreased Interest 1 1 0 1 0  Down, Depressed, Hopeless 0 0 0 - 0  PHQ - 2 Score 1 1 0 1 0  Altered sleeping 0 1 1 0 0  Tired, decreased energy 0 0 0 0 -  Change in appetite 0 0 0 0 -  Feeling bad or failure about yourself  0 0 0 0 -  Trouble concentrating 0 0 0 0 -  Moving slowly or fidgety/restless 0 0 0 0 -  Suicidal thoughts 0 0 0 0 -  PHQ-9 Score '1 2 1 1 ' 0  Difficult doing work/chores Not difficult at all Not difficult at all Not difficult at all Not  difficult at all -  Some recent data might be hidden    GAD 7 : Generalized Anxiety Score 05/03/2021 04/18/2021 01/31/2021 10/05/2020  Nervous, Anxious, on Edge 0 1 0 1  Control/stop worrying 0 0 0 0  Worry too much - different things 0 0 0 0  Trouble relaxing 0 0 0 0  Restless 0 0 0 0  Easily annoyed or irritable 0 0 0 0  Afraid - awful might happen 0 0 0 0  Total GAD 7 Score 0 1 0 1  Anxiety Difficulty Not difficult at all Not difficult at all Not difficult at all Not difficult at all   Vertigo:  He says he has had no episodes of dizziness in awhile. He has not taken any antivert recently.   Chills:  Saw him on 04/18/2021 for chills. He was diagnosed with a UTI and completed treatment. He says he has not had any more chills.  He says he is doing well.   Relevant past medical, surgical, family  and social history reviewed and updated as indicated. Interim medical history since our last visit reviewed. Allergies and medications reviewed and updated.  Review of Systems  Constitutional: Negative for fever or weight change.  Respiratory: Negative for cough and shortness of breath.   Cardiovascular: Negative for chest pain or palpitations.  Gastrointestinal: Negative for abdominal pain, no bowel changes.  Musculoskeletal: Positive for gait problem (uses cane) negative for  joint swelling.  Skin: Negative for rash.  Neurological: Negative for dizziness or headache.  No other specific complaints in a complete review of systems (except as listed in HPI above).      Objective:    BP (!) 148/76    Pulse 83    Temp 97.8 F (36.6 C) (Oral)    Resp 16    Ht '6\' 1"'  (1.854 m)    Wt 230 lb 6.4 oz (104.5 kg)    SpO2 96%    BMI 30.40 kg/m   Wt Readings from Last 3 Encounters:  05/03/21 230 lb 6.4 oz (104.5 kg)  04/18/21 229 lb 11.2 oz (104.2 kg)  02/08/21 231 lb (104.8 kg)    Physical Exam  Constitutional: Patient appears well-developed and well-nourished. Obese  No distress.  HEENT: head  atraumatic, normocephalic, pupils equal and reactive to light,  neck supple Cardiovascular: Normal rate, regular rhythm and normal heart sounds.  No murmur heard. No BLE edema. Pulmonary/Chest: Effort normal and breath sounds normal. No respiratory distress. Abdominal: Soft.  There is no tenderness. Psychiatric: Patient has a normal mood and affect. behavior is normal. Judgment and thought content normal.   Results for orders placed or performed in visit on 04/18/21  Urine Culture  Result Value Ref Range   MICRO NUMBER: [id number]    SPECIMEN QUALITY: Adequate    Sample Source URINE    STATUS: FINAL    ISOLATE 1: Citrobacter freundii (A)    ISOLATE 2: Enterobacter cloacae complex (A)       Susceptibility   Citrobacter freundii - URINE CULTURE, REFLEX    AMOX/CLAVULANIC 8 Resistant     CEFAZOLIN* >=64 Resistant      * For uncomplicated UTI caused by E. coli, K. pneumoniae or P. mirabilis: Cefazolin is susceptible if MIC <32 mcg/mL and predicts susceptible to the oral agents cefaclor, cefdinir, cefpodoxime, cefprozil, cefuroxime, cephalexin and loracarbef.     CEFTAZIDIME <=1 Sensitive     CEFEPIME <=1 Sensitive     CEFTRIAXONE <=1 Sensitive     CIPROFLOXACIN <=0.25 Sensitive     LEVOFLOXACIN <=0.12 Sensitive     GENTAMICIN <=1 Sensitive     IMIPENEM 0.5 Sensitive     NITROFURANTOIN <=16 Sensitive     PIP/TAZO <=4 Sensitive     TOBRAMYCIN <=1 Sensitive     TRIMETH/SULFA <=20 Sensitive    Enterobacter cloacae complex - URINE CULTURE NEGATIVE 2    AMOX/CLAVULANIC 4 Resistant     CEFAZOLIN <=4 Resistant     CEFTAZIDIME <=1 Sensitive     CEFEPIME <=1 Sensitive     CEFTRIAXONE <=1 Sensitive     CIPROFLOXACIN <=0.25 Sensitive     LEVOFLOXACIN <=0.12 Sensitive     GENTAMICIN <=1 Sensitive     IMIPENEM 1 Sensitive     NITROFURANTOIN 32 Sensitive     PIP/TAZO <=4 Sensitive     TOBRAMYCIN <=1 Sensitive     TRIMETH/SULFA* <=20 Sensitive      * For uncomplicated UTI caused by E.  coli, K. pneumoniae or P. mirabilis: Cefazolin is susceptible  if MIC <32 mcg/mL and predicts susceptible to the oral agents cefaclor, cefdinir, cefpodoxime, cefprozil, cefuroxime, cephalexin and loracarbef. Legend: S = Susceptible  I = Intermediate R = Resistant  NS = Not susceptible * = Not tested  NR = Not reported **NN = See antimicrobic comments   CBC with Differential/Platelet  Result Value Ref Range   WBC 6.5 3.8 - 10.8 Thousand/uL   RBC 5.70 4.20 - 5.80 Million/uL   Hemoglobin 17.9 (H) 13.2 - 17.1 g/dL   HCT 51.8 (H) 38.5 - 50.0 %   MCV 90.9 80.0 - 100.0 fL   MCH 31.4 27.0 - 33.0 pg   MCHC 34.6 32.0 - 36.0 g/dL   RDW 12.4 11.0 - 15.0 %   Platelets 228 140 - 400 Thousand/uL   MPV 11.1 7.5 - 12.5 fL   Neutro Abs 4,693 1,500 - 7,800 cells/uL   Lymphs Abs 1,125 850 - 3,900 cells/uL   Absolute Monocytes 585 200 - 950 cells/uL   Eosinophils Absolute 59 15 - 500 cells/uL   Basophils Absolute 39 0 - 200 cells/uL   Neutrophils Relative % 72.2 %   Total Lymphocyte 17.3 %   Monocytes Relative 9.0 %   Eosinophils Relative 0.9 %   Basophils Relative 0.6 %  COMPLETE METABOLIC PANEL WITH GFR  Result Value Ref Range   Glucose, Bld 96 65 - 99 mg/dL   BUN 16 7 - 25 mg/dL   Creat 1.15 0.70 - 1.22 mg/dL   eGFR 60 > OR = 60 mL/min/1.78m   BUN/Creatinine Ratio NOT APPLICABLE 6 - 22 (calc)   Sodium 139 135 - 146 mmol/L   Potassium 4.1 3.5 - 5.3 mmol/L   Chloride 105 98 - 110 mmol/L   CO2 27 20 - 32 mmol/L   Calcium 9.5 8.6 - 10.3 mg/dL   Total Protein 6.7 6.1 - 8.1 g/dL   Albumin 4.1 3.6 - 5.1 g/dL   Globulin 2.6 1.9 - 3.7 g/dL (calc)   AG Ratio 1.6 1.0 - 2.5 (calc)   Total Bilirubin 1.5 (H) 0.2 - 1.2 mg/dL   Alkaline phosphatase (APISO) 43 35 - 144 U/L   AST 9 (L) 10 - 35 U/L   ALT 6 (L) 9 - 46 U/L  TSH  Result Value Ref Range   TSH 3.24 0.40 - 4.50 mIU/L  POCT urinalysis dipstick  Result Value Ref Range   Color, UA gold    Clarity, UA cloudy    Glucose, UA Negative  Negative   Bilirubin, UA negative    Ketones, UA positive    Spec Grav, UA 1.020 1.010 - 1.025   Blood, UA large    pH, UA 5.0 5.0 - 8.0   Protein, UA Positive (A) Negative   Urobilinogen, UA 0.2 0.2 or 1.0 E.U./dL   Nitrite, UA positive    Leukocytes, UA Large (3+) (A) Negative   Appearance cloudy    Odor none       Assessment & Plan:   1. Essential hypertension -continue current treatment plan  2. Mixed hyperlipidemia -continue current treatment plan  3. Gastro-esophageal reflux disease without esophagitis -continue to monitor  4. Class 1 obesity due to excess calories with serious comorbidity and body mass index (BMI) of 30.0 to 30.9 in adult - continue to monitor  5. Stage 2 chronic kidney disease -continue to monitor  6. Anxiety -continue current treatment plan  7. Recurrent major depressive disorder, in partial remission ([place] -continue current treatment  8. Benign paroxysmal positional  vertigo, unspecified laterality -continue current treatment  9. Chills - no concerns at this time  Follow up plan: Return in about 3 months (around 07/31/2021) for follow up.

## 2021-05-11 DIAGNOSIS — F3342 Major depressive disorder, recurrent, in full remission: Secondary | ICD-10-CM | POA: Diagnosis not present

## 2021-05-11 DIAGNOSIS — Z7982 Long term (current) use of aspirin: Secondary | ICD-10-CM | POA: Diagnosis not present

## 2021-05-11 DIAGNOSIS — N183 Chronic kidney disease, stage 3 unspecified: Secondary | ICD-10-CM | POA: Diagnosis not present

## 2021-05-11 DIAGNOSIS — I129 Hypertensive chronic kidney disease with stage 1 through stage 4 chronic kidney disease, or unspecified chronic kidney disease: Secondary | ICD-10-CM | POA: Diagnosis not present

## 2021-05-11 DIAGNOSIS — I739 Peripheral vascular disease, unspecified: Secondary | ICD-10-CM | POA: Diagnosis not present

## 2021-05-12 ENCOUNTER — Ambulatory Visit: Payer: Self-pay | Admitting: *Deleted

## 2021-05-12 NOTE — Telephone Encounter (Signed)
I returned pt's call.   He called in c/o chills on his legs.  He saw the dr. Mervyn Skeeters month ago and was given antibiotics which helped so he is requesting a refill of the antibiotic.  Chills are from knees to feet.  I'm having chills below my knees down to my feet.   I don't have no open sores on my legs.   I have a chill last night and again today.     Cephalexin  500 mg is a medcine I was given.  What is this for?   I let him know it was an antibiotic but I did not know what it was prescribed to him for.  I made him an appt with Della Goo, [name] for 05/16/2020 at 3:20.      Reason for Disposition  Muscle aches are a chronic symptom (recurrent or ongoing AND present > 4 weeks)    C/o chills from his knees to his feet  Answer Assessment - Initial Assessment Questions 1. ONSET: "When did the muscle aches or body pains start?"      Pt c/o having chills from knees to feet.  Been intermittent.   Has been seen for this before.   He is requesting a refill on his antibiotic.  The Cephalexin is what he spelled out for me from the bottle.   I don't see this on his medication list. 2. LOCATION: "What part of your body is hurting?" (e.g., entire body, arms, legs)      Having chills from knees to feet 3. SEVERITY: "How bad is the pain?" (Scale 1-10; or mild, moderate, severe)   - MILD (1-3): doesn't interfere with normal activities    - MODERATE (4-7): interferes with normal activities or awakens from sleep    - SEVERE (8-10):  excruciating pain, unable to do any normal activities      *No Answer* 4. CAUSE: "What do you think is causing the pains?"     *No Answer* 5. FEVER: "Have you been having fever?"     *No Answer* 6. OTHER SYMPTOMS: "Do you have any other symptoms?" (e.g., chest pain, weakness, rash, cold or flu symptoms, weight loss)     *No Answer* 7. PREGNANCY: "Is there any chance you are pregnant?" "When was your last menstrual period?"     *No Answer* 8. TRAVEL: "Have you traveled out of the  country in the last month?" (e.g., travel history, exposures)     *No Answer*  Protocols used: Muscle Aches and Body Pain-A-AH

## 2021-05-12 NOTE — Telephone Encounter (Signed)
°  Chief Complaint: chills from knees to feet Symptoms: above Frequency: intermittently Pertinent Negatives: Patient denies open wounds or pain in either leg.  Been seen for this before. Disposition: [] ED /[] Urgent Care (no appt availability in office) / [x] Appointment(In office/virtual)/ []  [facility]/ [] Home Care/ [] Refused Recommended Disposition /[] [place] [facility]/ []  Follow-up with PCP Additional Notes:

## 2021-05-16 ENCOUNTER — Encounter: Payer: Self-pay | Admitting: Nurse Practitioner

## 2021-05-16 ENCOUNTER — Ambulatory Visit (INDEPENDENT_AMBULATORY_CARE_PROVIDER_SITE_OTHER): Payer: PPO | Admitting: Nurse Practitioner

## 2021-05-16 VITALS — BP 138/64 | HR 94 | Temp 97.9°F | Resp 18 | Ht 73.0 in | Wt 227.5 lb

## 2021-05-16 DIAGNOSIS — R6883 Chills (without fever): Secondary | ICD-10-CM

## 2021-05-16 NOTE — Progress Notes (Signed)
BP 138/64    Pulse 94    Temp 97.9 F (36.6 C) (Oral)    Resp 18    Ht '6\' 1"'  (1.854 m)    Wt 227 lb 8 oz (103.2 kg)    SpO2 93%    BMI 30.02 kg/m    Subjective:    Patient ID: Jeffery Campbell, male    DOB: June 06, 1928, 88 y.o.   MRN: [id number]  HPI: Jeffery Campbell is a 88 y.o. male  Chief Complaint  Patient presents with   Leg Problem    Both legs from knee down pt describes feeling "chills"   Chills down both legs:  He says about a week ago he had chills going down both legs from his knees to his feet, that lasted for about ten minutes.  He says it has not happened again in the last five days.  He denies any fever or chills any where else. His legs and feet have good pulse, motor and sensory.  His color is also good. Discussed trying compression socks to help with those . Discussed if no improvement will send referral to neurology for nerve testing.  He is agreeable to plan.  Relevant past medical, surgical, family and social history reviewed and updated as indicated. Interim medical history since our last visit reviewed. Allergies and medications reviewed and updated.  Review of Systems  Constitutional: Negative for fever or weight change.  Respiratory: Negative for cough and shortness of breath.   Cardiovascular: Negative for chest pain or palpitations.  Gastrointestinal: Negative for abdominal pain, no bowel changes.  Musculoskeletal: Negative for gait problem or joint swelling.  Skin: Negative for rash.  Neurological: Negative for dizziness or headache.  No other specific complaints in a complete review of systems (except as listed in HPI above).      Objective:    BP 138/64    Pulse 94    Temp 97.9 F (36.6 C) (Oral)    Resp 18    Ht '6\' 1"'  (1.854 m)    Wt 227 lb 8 oz (103.2 kg)    SpO2 93%    BMI 30.02 kg/m   Wt Readings from Last 3 Encounters:  05/16/21 227 lb 8 oz (103.2 kg)  05/03/21 230 lb 6.4 oz (104.5 kg)  04/18/21 229 lb 11.2 oz (104.2 kg)    Physical  Exam  Constitutional: Patient appears well-developed and well-nourished. Overweight No distress.  HEENT: head atraumatic, normocephalic, pupils equal and reactive to light, neck supple Cardiovascular: Normal rate, regular rhythm and normal heart sounds.  No murmur heard. No BLE edema. Pulmonary/Chest: Effort normal and breath sounds normal. No respiratory distress. Abdominal: Soft.  There is no tenderness. Bilateral lower extremities; good color, cap refill < 2 sec, good pulse, motor and sensory Psychiatric: Patient has a normal mood and affect. behavior is normal. Judgment and thought content normal.   Results for orders placed or performed in visit on 04/18/21  Urine Culture  Result Value Ref Range   MICRO NUMBER: [id number]    SPECIMEN QUALITY: Adequate    Sample Source URINE    STATUS: FINAL    ISOLATE 1: Citrobacter freundii (A)    ISOLATE 2: Enterobacter cloacae complex (A)       Susceptibility   Citrobacter freundii - URINE CULTURE, REFLEX    AMOX/CLAVULANIC 8 Resistant     CEFAZOLIN* >=64 Resistant      * For uncomplicated UTI caused by E. coli, K. pneumoniae or P. mirabilis: Cefazolin is  susceptible if MIC <32 mcg/mL and predicts susceptible to the oral agents cefaclor, cefdinir, cefpodoxime, cefprozil, cefuroxime, cephalexin and loracarbef.     CEFTAZIDIME <=1 Sensitive     CEFEPIME <=1 Sensitive     CEFTRIAXONE <=1 Sensitive     CIPROFLOXACIN <=0.25 Sensitive     LEVOFLOXACIN <=0.12 Sensitive     GENTAMICIN <=1 Sensitive     IMIPENEM 0.5 Sensitive     NITROFURANTOIN <=16 Sensitive     PIP/TAZO <=4 Sensitive     TOBRAMYCIN <=1 Sensitive     TRIMETH/SULFA <=20 Sensitive    Enterobacter cloacae complex - URINE CULTURE NEGATIVE 2    AMOX/CLAVULANIC 4 Resistant     CEFAZOLIN <=4 Resistant     CEFTAZIDIME <=1 Sensitive     CEFEPIME <=1 Sensitive     CEFTRIAXONE <=1 Sensitive     CIPROFLOXACIN <=0.25 Sensitive     LEVOFLOXACIN <=0.12 Sensitive     GENTAMICIN <=1  Sensitive     IMIPENEM 1 Sensitive     NITROFURANTOIN 32 Sensitive     PIP/TAZO <=4 Sensitive     TOBRAMYCIN <=1 Sensitive     TRIMETH/SULFA* <=20 Sensitive      * For uncomplicated UTI caused by E. coli, K. pneumoniae or P. mirabilis: Cefazolin is susceptible if MIC <32 mcg/mL and predicts susceptible to the oral agents cefaclor, cefdinir, cefpodoxime, cefprozil, cefuroxime, cephalexin and loracarbef. Legend: S = Susceptible  I = Intermediate R = Resistant  NS = Not susceptible * = Not tested  NR = Not reported **NN = See antimicrobic comments   CBC with Differential/Platelet  Result Value Ref Range   WBC 6.5 3.8 - 10.8 Thousand/uL   RBC 5.70 4.20 - 5.80 Million/uL   Hemoglobin 17.9 (H) 13.2 - 17.1 g/dL   HCT 51.8 (H) 38.5 - 50.0 %   MCV 90.9 80.0 - 100.0 fL   MCH 31.4 27.0 - 33.0 pg   MCHC 34.6 32.0 - 36.0 g/dL   RDW 12.4 11.0 - 15.0 %   Platelets 228 140 - 400 Thousand/uL   MPV 11.1 7.5 - 12.5 fL   Neutro Abs 4,693 1,500 - 7,800 cells/uL   Lymphs Abs 1,125 850 - 3,900 cells/uL   Absolute Monocytes 585 200 - 950 cells/uL   Eosinophils Absolute 59 15 - 500 cells/uL   Basophils Absolute 39 0 - 200 cells/uL   Neutrophils Relative % 72.2 %   Total Lymphocyte 17.3 %   Monocytes Relative 9.0 %   Eosinophils Relative 0.9 %   Basophils Relative 0.6 %  COMPLETE METABOLIC PANEL WITH GFR  Result Value Ref Range   Glucose, Bld 96 65 - 99 mg/dL   BUN 16 7 - 25 mg/dL   Creat 1.15 0.70 - 1.22 mg/dL   eGFR 60 > OR = 60 mL/min/1.37m   BUN/Creatinine Ratio NOT APPLICABLE 6 - 22 (calc)   Sodium 139 135 - 146 mmol/L   Potassium 4.1 3.5 - 5.3 mmol/L   Chloride 105 98 - 110 mmol/L   CO2 27 20 - 32 mmol/L   Calcium 9.5 8.6 - 10.3 mg/dL   Total Protein 6.7 6.1 - 8.1 g/dL   Albumin 4.1 3.6 - 5.1 g/dL   Globulin 2.6 1.9 - 3.7 g/dL (calc)   AG Ratio 1.6 1.0 - 2.5 (calc)   Total Bilirubin 1.5 (H) 0.2 - 1.2 mg/dL   Alkaline phosphatase (APISO) 43 35 - 144 U/L   AST 9 (L) 10 - 35 U/L    ALT  6 (L) 9 - 46 U/L  TSH  Result Value Ref Range   TSH 3.24 0.40 - 4.50 mIU/L  POCT urinalysis dipstick  Result Value Ref Range   Color, UA gold    Clarity, UA cloudy    Glucose, UA Negative Negative   Bilirubin, UA negative    Ketones, UA positive    Spec Grav, UA 1.020 1.010 - 1.025   Blood, UA large    pH, UA 5.0 5.0 - 8.0   Protein, UA Positive (A) Negative   Urobilinogen, UA 0.2 0.2 or 1.0 E.U./dL   Nitrite, UA positive    Leukocytes, UA Large (3+) (A) Negative   Appearance cloudy    Odor none       Assessment & Plan:   1. Chills (without fever) - use compression socks for chills to legs  Follow up plan: Return if symptoms worsen or fail to improve.

## 2021-06-23 DIAGNOSIS — M3501 Sicca syndrome with keratoconjunctivitis: Secondary | ICD-10-CM | POA: Diagnosis not present

## 2021-06-28 DIAGNOSIS — H04123 Dry eye syndrome of bilateral lacrimal glands: Secondary | ICD-10-CM | POA: Diagnosis not present

## 2021-07-03 DIAGNOSIS — H04123 Dry eye syndrome of bilateral lacrimal glands: Secondary | ICD-10-CM | POA: Diagnosis not present

## 2021-07-24 ENCOUNTER — Telehealth: Payer: Self-pay | Admitting: Urology

## 2021-07-24 ENCOUNTER — Ambulatory Visit: Payer: Self-pay | Admitting: *Deleted

## 2021-07-24 NOTE — Telephone Encounter (Signed)
I returned pt's call.   C/o his 4 morning medications making him very sleepy after he takes them.  Is it his medications making him sleepy?   Can he take them at night? ? ? ?Reason for Disposition ? [1] Caller has NON-URGENT medicine question about med that PCP prescribed AND [2] triager unable to answer question ?   Wanted to know if any of his 4 morning meds could be making him very sleepy after taking them in the morning. ? ?Answer Assessment - Initial Assessment Questions ?1. NAME of MEDICATION: "What medicine are you calling about?" ?    I take 4 medications in the morning.   They are making me sleepy, I think or it might be I'm just getting lazy and old.   I sit down in my chair and go to sleep. ?2. QUESTION: "What is your question?" (e.g., double dose of medicine, side effect) ?    Can I take them at night?  He read out the 4 meds he is taking: ?Cardizem, Uroxatrol, Buspar, and fish oil. ? ?3. PRESCRIBING HCP: "Who prescribed it?" Reason: if prescribed by specialist, call should be referred to that group. ?    Della Goo, [name] ?4. SYMPTOMS: "Do you have any symptoms?" ?    Sleepy in the morning. ?5. SEVERITY: If symptoms are present, ask "Are they mild, moderate or severe?" ?    I think I'm just sleepy in the morning.   Thank you anyway. ?6. PREGNANCY:  "Is there any chance that you are pregnant?" "When was your last menstrual period?" ?   N/A ? ?Protocols used: Medication Question Call-A-AH ? ?

## 2021-07-24 NOTE — Telephone Encounter (Signed)
?  Chief Complaint: Wanted to know if any of the 4 medications he takes in the morning could be making him very sleepy.  Could he take them at night if they do. ?Symptoms: In mornings after taking his medications and eating breakfast he gets very sleepy and sleeps in his recliner.   Is this normal? ?Frequency: Every morning ?Pertinent Negatives: Patient denies Other issues.   "I think I'm just getting old so I want to sleep" ?Disposition: [] ED /[] Urgent Care (no appt availability in office) / [] Appointment(In office/virtual)/ []  [place]/ [x] Home Care/ [] Refused Recommended Disposition /[] [place] [facility]/ []  Follow-up with PCP ?Additional Notes: Sent message to , [name] as though pt thanked me for helping him and "I think I'm just getting older and want to sleep".   "Getting lazy, I guess".    ? ?  ?

## 2021-07-31 ENCOUNTER — Ambulatory Visit: Payer: PPO | Admitting: Nurse Practitioner

## 2021-08-10 DIAGNOSIS — H26492 Other secondary cataract, left eye: Secondary | ICD-10-CM | POA: Diagnosis not present

## 2021-08-10 DIAGNOSIS — M3501 Sicca syndrome with keratoconjunctivitis: Secondary | ICD-10-CM | POA: Diagnosis not present

## 2021-08-10 DIAGNOSIS — H04123 Dry eye syndrome of bilateral lacrimal glands: Secondary | ICD-10-CM | POA: Diagnosis not present

## 2021-08-10 DIAGNOSIS — Z961 Presence of intraocular lens: Secondary | ICD-10-CM | POA: Diagnosis not present

## 2021-08-18 ENCOUNTER — Ambulatory Visit (INDEPENDENT_AMBULATORY_CARE_PROVIDER_SITE_OTHER): Payer: PPO | Admitting: Nurse Practitioner

## 2021-08-18 ENCOUNTER — Ambulatory Visit: Payer: Self-pay | Admitting: *Deleted

## 2021-08-18 ENCOUNTER — Encounter: Payer: Self-pay | Admitting: Nurse Practitioner

## 2021-08-18 VITALS — BP 130/72 | HR 88 | Temp 97.9°F | Resp 16 | Ht 73.0 in | Wt 227.0 lb

## 2021-08-18 DIAGNOSIS — R351 Nocturia: Secondary | ICD-10-CM

## 2021-08-18 DIAGNOSIS — I10 Essential (primary) hypertension: Secondary | ICD-10-CM | POA: Diagnosis not present

## 2021-08-18 DIAGNOSIS — R350 Frequency of micturition: Secondary | ICD-10-CM | POA: Diagnosis not present

## 2021-08-18 DIAGNOSIS — N3000 Acute cystitis without hematuria: Secondary | ICD-10-CM

## 2021-08-18 LAB — POCT URINALYSIS DIPSTICK
Bilirubin, UA: NEGATIVE
Glucose, UA: NEGATIVE
Ketones, UA: NEGATIVE
Nitrite, UA: POSITIVE
Protein, UA: NEGATIVE
Spec Grav, UA: 1.025 (ref 1.010–1.025)
Urobilinogen, UA: 0.2 E.U./dL
pH, UA: 6 (ref 5.0–8.0)

## 2021-08-18 MED ORDER — CEPHALEXIN 500 MG PO CAPS: 500.0000 mg | ORAL_CAPSULE | Freq: Two times a day (BID) | ORAL | 0 refills | Status: AC

## 2021-08-18 NOTE — Progress Notes (Signed)
BP 130/72   Pulse 88   Temp 97.9 F (36.6 C)   Resp 16   Ht 6\' 1"  (1.854 m)   Wt 227 lb (103 kg)   SpO2 94%   BMI 29.95 kg/m    Subjective:    Patient ID: Jeffery Campbell, male    DOB: 01/01/1929, 88 y.o.   MRN: [id number]  HPI: Cal Gindlesperger is a 88 y.o. male, here alone  Chief Complaint  Patient presents with   Nocturia   UTI/Urinary frequency/nocturia: Patient reports for the last day he has had some increased urination especially at night.  Patient denies any back pain, dysuria or fever.  Patient does have a history of BPH.  We will get urine and send for culture.  Urine dip was positive for nitrates and large leukocytes.  We will treat for UTI.  Discussed with patient signs that would require going to the emergency room.  We will send in antibiotic treatment.  Patient is in agreement with plan.  Elevated blood pressure reading/ history of hypertension: Patient's blood pressure upon arrival was 156/62.  Recheck was 130/72.  Patient denies any chest pain, shortness of breath, headaches or blurred vision.  Patient currently takes diltiazem 120 mg daily.  He says he has been consistent with his medication.  He does not check his blood pressure at home.  We will continue with current treatment plan.  Relevant past medical, surgical, family and social history reviewed and updated as indicated. Interim medical history since our last visit reviewed. Allergies and medications reviewed and updated.  Review of Systems  Constitutional: Negative for fever or weight change.  Respiratory: Negative for cough and shortness of breath.   Cardiovascular: Negative for chest pain or palpitations.  Gastrointestinal: Negative for abdominal pain, no bowel changes.  GU: Positive for urinary frequency Musculoskeletal: Negative for gait problem or joint swelling.  Skin: Negative for rash.  Neurological: Negative for dizziness or headache.  No other specific complaints in a complete review of systems  (except as listed in HPI above).      Objective:    BP 130/72   Pulse 88   Temp 97.9 F (36.6 C)   Resp 16   Ht 6\' 1"  (1.854 m)   Wt 227 lb (103 kg)   SpO2 94%   BMI 29.95 kg/m   Wt Readings from Last 3 Encounters:  08/18/21 227 lb (103 kg)  05/16/21 227 lb 8 oz (103.2 kg)  05/03/21 230 lb 6.4 oz (104.5 kg)    Physical Exam Constitutional: Patient appears well-developed and well-nourished. Obese  No distress.  HEENT: head atraumatic, normocephalic, pupils equal and reactive to light, neck supple, throat within normal limits Cardiovascular: Normal rate, regular rhythm and normal heart sounds.  No murmur heard. No BLE edema. Pulmonary/Chest: Effort normal and breath sounds normal. No respiratory distress. Abdominal: Soft.  There is no tenderness.  No CVA tenderness Psychiatric: Patient has a normal mood and affect. behavior is normal. Judgment and thought content normal.  Results for orders placed or performed in visit on 08/18/21  POCT urinalysis dipstick  Result Value Ref Range   Color, UA drk yellow    Clarity, UA clear    Glucose, UA Negative Negative   Bilirubin, UA neg    Ketones, UA neg    Spec Grav, UA 1.025 1.010 - 1.025   Blood, UA trace    pH, UA 6.0 5.0 - 8.0   Protein, UA Negative Negative   Urobilinogen, UA  0.2 0.2 or 1.0 E.U./dL   Nitrite, UA pos    Leukocytes, UA Large (3+) (A) Negative   Appearance clear    Odor strong       Assessment & Plan:   Problem List Items Addressed This Visit       Cardiovascular and Mediastinum   Essential hypertension (Chronic)    Continue taking diltiazem 120 mg daily.  Blood pressure improved after retake.       Other Visit Diagnoses     Acute cystitis without hematuria    -  Primary   Sent urine for urine culture starting patient on Keflex 500 mg daily for 7 days.  Push fluids.  Watch for signs of worsening condition.   Relevant Medications   cephALEXin (KEFLEX) 500 MG capsule   Urinary frequency       Sent  urine for urine culture starting patient on Keflex 500 mg daily for 7 days.  Push fluids.  Watch for signs of worsening condition.   Relevant Orders   POCT urinalysis dipstick (Completed)   Urine Culture   Nocturia       Sent urine for urine culture starting patient on Keflex 500 mg daily for 7 days.  Push fluids.  Watch for signs of worsening condition.   Relevant Orders   POCT urinalysis dipstick (Completed)   Urine Culture        Follow up plan: Return if symptoms worsen or fail to improve.

## 2021-08-18 NOTE — Telephone Encounter (Signed)
  Chief Complaint: nocturia, patient request appointment Symptoms: frequency Frequency: last night Pertinent Negatives: Patient denies other urinary symptoms Disposition: [] ED /[] Urgent Care (no appt availability in office) / [x] Appointment(In office/virtual)/ []  [place] [facility]/ [] Home Care/ [] Refused Recommended Disposition /[] [place] [facility]/ []  Follow-up with PCP Additional Notes: Patient is requesting appointment- patient has been scheduled

## 2021-08-18 NOTE — Assessment & Plan Note (Signed)
Continue taking diltiazem 120 mg daily.  Blood pressure improved after retake.

## 2021-08-18 NOTE — Telephone Encounter (Signed)
Summary: frequent urination   The patient has experienced an increase frequency in urination   The patient shares that they went to the restroom four times yesterday evening   The patient shares that they're concerned by this abnormality   Please contact further      Reason for Disposition  Urinating more frequently than usual (i.e., frequency)  Answer Assessment - Initial Assessment Questions 1. SYMPTOM: "What's the main symptom you're concerned about?" (e.g., frequency, incontinence)     Nocturia- 4 times last night- usually gets up 1-2 times during the evening 2. ONSET: "When did the   frequency  start?"     Last night 3. PAIN: "Is there any pain?" If Yes, ask: "How bad is it?" (Scale: 1-10; mild, moderate, severe)     no 4. CAUSE: "What do you think is causing the symptoms?"     Not sure 5. OTHER SYMPTOMS: "Do you have any other symptoms?" (e.g., fever, flank pain, blood in urine, pain with urination)     no 6. PREGNANCY: "Is there any chance you are pregnant?" "When was your last menstrual period?"  Protocols used: Urinary Symptoms-A-AH

## 2021-08-20 LAB — URINE CULTURE
MICRO NUMBER:: 13421178
SPECIMEN QUALITY:: ADEQUATE

## 2021-09-06 ENCOUNTER — Other Ambulatory Visit: Payer: Self-pay | Admitting: Family Medicine

## 2021-09-06 ENCOUNTER — Other Ambulatory Visit: Payer: Self-pay | Admitting: Nurse Practitioner

## 2021-09-06 DIAGNOSIS — E782 Mixed hyperlipidemia: Secondary | ICD-10-CM

## 2021-09-06 NOTE — Telephone Encounter (Signed)
Requested Prescriptions  Pending Prescriptions Disp Refills  . diltiazem (CARDIZEM CD) 120 MG 24 hr capsule [Pharmacy Med Name: DILTIAZEM 24H ER(CD) 120 MG CP] 90 capsule 0    Sig: TAKE 1 CAPSULE BY MOUTH EVERY DAY     Cardiovascular: Calcium Channel Blockers 3 Failed - 09/06/2021  2:50 AM      Failed - ALT in normal range and within 360 days    ALT  Date Value Ref Range Status  04/18/2021 6 (L) 9 - 46 U/L Final         Failed - AST in normal range and within 360 days    AST  Date Value Ref Range Status  04/18/2021 9 (L) 10 - 35 U/L Final         Passed - Cr in normal range and within 360 days    Creat  Date Value Ref Range Status  04/18/2021 1.15 0.70 - 1.22 mg/dL Final         Passed - Last BP in normal range    BP Readings from Last 1 Encounters:  08/18/21 130/72         Passed - Last Heart Rate in normal range    Pulse Readings from Last 1 Encounters:  08/18/21 88         Passed - Valid encounter within last 6 months    Recent Outpatient Visits          2 weeks ago Acute cystitis without hematuria   [facility] Berniece Salines, [name]   3 months ago Chills (without fever)   [facility] Berniece Salines, [name]   4 months ago Essential hypertension   [facility] Berniece Salines, [name]   4 months ago Chills   [facility] Berniece Salines, [name]   7 months ago Essential hypertension   [facility] Danelle Berry, PA-C      Future Appointments            In 2 months [name], Elana Alm [facility]

## 2021-09-08 ENCOUNTER — Ambulatory Visit: Payer: Self-pay

## 2021-09-08 NOTE — Telephone Encounter (Signed)
  Chief Complaint: Lower back pain Symptoms: Mild moderate lower back pain when he gets up Frequency:  Pertinent Negatives: Patient denies  Disposition: [] ED /[] Urgent Care (no appt availability in office) / [] Appointment(In office/virtual)/ []  [place] [facility]/ [] Home Care/ [] Refused Recommended Disposition /[] [place] [facility]/ [x]  Follow-up with PCP Additional Notes: Pt states that he has back pain when he gets up. Pt asked if Acetaminophen is good for pain.  PT will take that and will call back if pain does not resolve. Summary: advice - pain   Pt called in requesting to speak with a nurse due to back pain, pt wanted to know if something could be called in for him.      Reason for Disposition  Back pain  Answer Assessment - Initial Assessment Questions 1. ONSET: "When did the pain begin?"       2. LOCATION: "Where does it hurt?" (upper, mid or lower back)     lower 3. SEVERITY: "How bad is the pain?"  (e.g., Scale 1-10; mild, moderate, or severe)   - MILD (1-3): doesn't interfere with normal activities    - MODERATE (4-7): interferes with normal activities or awakens from sleep    - SEVERE (8-10): excruciating pain, unable to do any normal activities      Mild-moderate 4. PATTERN: "Is the pain constant?" (e.g., yes, no; constant, intermittent)      no 5. RADIATION: "Does the pain shoot into your legs or elsewhere?"     no 6. CAUSE:  "What do you think is causing the back pain?"      unsure 7. BACK OVERUSE:  "Any recent lifting of heavy objects, strenuous work or exercise?"     unknown 8. MEDICATIONS: "What have you taken so far for the pain?" (e.g., nothing, acetaminophen, NSAIDS)     nothing 9. NEUROLOGIC SYMPTOMS: "Do you have any weakness, numbness, or problems with bowel/bladder control?"      10. OTHER SYMPTOMS: "Do you have any other symptoms?" (e.g., fever, abdominal pain, burning with urination, blood in urine)        11. PREGNANCY: "Is there any chance  you are pregnant?" (e.g., yes, no; LMP)       na  Protocols used: Back Pain-A-AH

## 2021-09-13 ENCOUNTER — Ambulatory Visit: Payer: Self-pay

## 2021-09-13 NOTE — Telephone Encounter (Signed)
  Chief Complaint: chills Symptoms: chills only at nighttime  Frequency:  Pertinent Negatives: NA Disposition: [] ED /[] Urgent Care (no appt availability in office) / [x] Appointment(In office/virtual)/ []  [place] [facility]/ [] Home Care/ [] Refused Recommended Disposition /[] [place] [facility]/ []  Follow-up with PCP Additional Notes: pt wanted to know if there is a medication he could take or this. I advised him that we could schedule an appt and talk to provider about this and possibly do some labs and go from there. Scheduled for Friday 09/15/21 1540. Pt would like reminder call for appt.    Summary: questions about medication   Pt requests that a nurse return his call because he has questions about a medication. Attempted to gather more information from pt but he hung up. Cb# [phone]

## 2021-09-15 ENCOUNTER — Ambulatory Visit: Payer: PPO | Admitting: Internal Medicine

## 2021-09-15 DIAGNOSIS — F419 Anxiety disorder, unspecified: Secondary | ICD-10-CM | POA: Diagnosis not present

## 2021-09-15 NOTE — Progress Notes (Deleted)
   Acute Office Visit  Subjective:     Patient ID: Jeffery Campbell, male    DOB: [birth year], 88 y.o.   MRN: [id number]  No chief complaint on file.   HPI Patient is in today for chills at night.    ROS      Objective:    There were no vitals taken for this visit. {Vitals History (Optional):23777}  Physical Exam  No results found for any visits on 09/15/21.      Assessment & Plan:   Problem List Items Addressed This Visit   None   No orders of the defined types were placed in this encounter.   No follow-ups on file.  Margarita Mail, DO

## 2021-09-20 ENCOUNTER — Ambulatory Visit (INDEPENDENT_AMBULATORY_CARE_PROVIDER_SITE_OTHER): Payer: PPO | Admitting: Family Medicine

## 2021-09-20 ENCOUNTER — Ambulatory Visit: Payer: Self-pay

## 2021-09-20 VITALS — BP 122/76 | HR 71 | Temp 98.3°F | Resp 16 | Ht 73.0 in | Wt 228.0 lb

## 2021-09-20 DIAGNOSIS — N401 Enlarged prostate with lower urinary tract symptoms: Secondary | ICD-10-CM | POA: Diagnosis not present

## 2021-09-20 DIAGNOSIS — R350 Frequency of micturition: Secondary | ICD-10-CM | POA: Diagnosis not present

## 2021-09-20 LAB — POCT URINALYSIS DIPSTICK
Bilirubin, UA: NEGATIVE
Glucose, UA: NEGATIVE
Ketones, UA: NEGATIVE
Nitrite, UA: POSITIVE
Protein, UA: POSITIVE — AB
Spec Grav, UA: 1.02 (ref 1.010–1.025)
Urobilinogen, UA: 0.2 E.U./dL
pH, UA: 5 (ref 5.0–8.0)

## 2021-09-20 MED ORDER — AMOXICILLIN-POT CLAVULANATE 875-125 MG PO TABS: 1.0000 | ORAL_TABLET | Freq: Two times a day (BID) | ORAL | 0 refills | Status: DC

## 2021-09-20 NOTE — Patient Instructions (Signed)
It was great to see you!  You are taking alfuzosin and finasteride for your prostate.   Our plans for today:  - Take the antibioitic for 7 days.   We are checking some labs today, we will release these results to your MyChart.  Take care and seek immediate care sooner if you develop any concerns.   Dr. Linwood Dibbles

## 2021-09-20 NOTE — Telephone Encounter (Signed)
  Chief Complaint: urinary frequency Symptoms: urinary frequency, getting up 3-4 times a night Frequency: 1 week  Pertinent Negatives: Patient denies pain or burning with urination Disposition: [] ED /[] Urgent Care (no appt availability in office) / [x] Appointment(In office/virtual)/ []  [place] [facility]/ [] Home Care/ [] Refused Recommended Disposition /[] [place] [facility]/ []  Follow-up with PCP Additional Notes: scheduled appt for today at 1320 with PCP. Pt states he will come in for that OV.   Summary: frequent urination   Pt has frequent urination issue and was asking if he can get medication for this or if he needs to be seen / please advise      Reason for Disposition  Urinating more frequently than usual (i.e., frequency)  Answer Assessment - Initial Assessment Questions 1. SYMPTOM: "What's the main symptom you're concerned about?" (e.g., frequency, incontinence)     frequency 2. ONSET: "When did the  sx  start?"     1 week  3. PAIN: "Is there any pain?" If Yes, ask: "How bad is it?" (Scale: 1-10; mild, moderate, severe)     no 5. OTHER SYMPTOMS: "Do you have any other symptoms?" (e.g., fever, flank pain, blood in urine, pain with urination)     Gets up 3-4 times a night to use bathroom  Protocols used: Urinary Symptoms-A-AH

## 2021-09-20 NOTE — Progress Notes (Signed)
    SUBJECTIVE:   CHIEF COMPLAINT / HPI:   URINARY SYMPTOMS  Duration: 2-3 weeks Dysuria: no Urinary frequency: yes Urgency: yes Urinary incontinence: no Foul odor: no Hematuria: no Abdominal pain: no Back pain: no Suprapubic pain/pressure: no Flank pain: no Fever:  no Vomiting: no Relief with cranberry juice: hasn't tried Relief with pyridium: hasn't tried Status: better/worse/stable Previous urinary tract infection: yes Recurrent urinary tract infection: has had 2 culture positive UTI in last 6 months. Follows with Urology for BPH. Penile discharge: no Treatments attempted: OTC prostate health+    OBJECTIVE:   BP 122/76   Pulse 71   Temp 98.3 F (36.8 C)   Resp 16   Ht 6\' 1"  (1.854 m)   Wt 228 lb (103.4 kg)   SpO2 94%   BMI 30.08 kg/m   Gen: well appearing, in NAD Card: Reg rate Lungs: Comfortable WOB on RA Ext: WWP, no edema   ASSESSMENT/PLAN:   Urinary frequency Has underlying BPH which is likely contributing to symptoms however with recent increase in symptoms and new urgency and UA indicative of infection. Will send sample for culture and provide augmentin based on age and last culture results. Will adjust as indicated when culture results.    , DO

## 2021-09-22 LAB — URINE CULTURE
MICRO NUMBER:: 13554773
SPECIMEN QUALITY:: ADEQUATE

## 2021-09-23 DIAGNOSIS — N390 Urinary tract infection, site not specified: Secondary | ICD-10-CM | POA: Diagnosis not present

## 2021-09-25 MED ORDER — NITROFURANTOIN MONOHYD MACRO 100 MG PO CAPS: 100.0000 mg | ORAL_CAPSULE | Freq: Two times a day (BID) | ORAL | 0 refills | Status: AC

## 2021-09-26 ENCOUNTER — Ambulatory Visit: Payer: Self-pay

## 2021-09-26 DIAGNOSIS — N390 Urinary tract infection, site not specified: Secondary | ICD-10-CM | POA: Diagnosis not present

## 2021-09-26 NOTE — Telephone Encounter (Signed)
Patient notified to stop augmentin and take Macrobid

## 2021-09-28 ENCOUNTER — Ambulatory Visit: Payer: Self-pay

## 2021-09-28 ENCOUNTER — Telehealth: Payer: Self-pay | Admitting: Nurse Practitioner

## 2021-09-28 ENCOUNTER — Ambulatory Visit: Payer: Self-pay | Admitting: *Deleted

## 2021-09-28 NOTE — Telephone Encounter (Signed)
Summary: pt just given med/ can he take with other meds/what is it for/issues in night   nitrofurantoin, macrocrystal-monohydrate, (MACROBID) 100 MG capsule 10 capsule Pt has just been given this and states does not know what is for, states he was up and down all night going to bathroom, he also take other prostate meds he gets from drugstore, should he take those with this med. Needs info because does not know what med is for. FU [phone]     Advised pt that the Macrobid was started because the bacteria found in urine was resistant to Augmentin. Pt with questions about prostate medication-Advised pt to call his urologist Roxan Hockey PA. Phone number provided.   Reason for Disposition  Health Information question, no triage required and triager able to answer question  Answer Assessment - Initial Assessment Questions 1. REASON FOR CALL or QUESTION: "What is your reason for calling today?" or "How can I best help you?" or "What question do you have that I can help answer?"     Summary: pt just given med/ can he take with other meds/what is it for/issues in night   nitrofurantoin, macrocrystal-monohydrate, (MACROBID) 100 MG capsule 10 capsule Pt has just been given this and states does not know what is for, states he was up and down all night going to bathroom, he also take other prostate meds he gets from drugstore, should he take those with this med. Needs info because does not know what med is for. FU [phone]  Protocols used: Information Only Call - No Triage-A-AH

## 2021-09-28 NOTE — Telephone Encounter (Signed)
Pt called and stated that he still had questions about the medication nitrofurantoin, macrocrystal-monohydrate, (MACROBID) 100 MG. He would like a call back from the nurse as soon as possible.   Patient takes a OTC supplement for prostate- Super Beta Prostate- active ingredient : Sitosterol.  Patient wants to know if he can continue this while taking the antibiotic.  Patient advised per [employer]- there are no drug interactions with these 2 medications.  Will send to provider for review- but it should be fine to continue. Reason for Disposition  General information question, no triage required and triager able to answer question  Answer Assessment - Initial Assessment Questions 1. REASON FOR CALL or QUESTION: "What is your reason for calling today?" or "How can I best help you?" or "What question do you have that I can help answer?"     Can patient take both: Macrobid and OTC Super Beta Prostate ( Sitosterol)  Protocols used: Information Only Call - No Triage-A-AH

## 2021-09-28 NOTE — Telephone Encounter (Signed)
See triage note.

## 2021-09-28 NOTE — Telephone Encounter (Signed)
Pt called and stated that he still had questions about the medication nitrofurantoin, macrocrystal-monohydrate, (MACROBID) 100 MG. He would like a call back from the nurse as soon as possible.

## 2021-10-10 ENCOUNTER — Ambulatory Visit: Payer: Self-pay

## 2021-10-10 NOTE — Telephone Encounter (Signed)
  Chief Complaint: Sleepiness after morning medications Symptoms: Sleepiness Frequency: everyday Pertinent Negatives: Patient denies  Disposition: [] ED /[] Urgent Care (no appt availability in office) / [] Appointment(In office/virtual)/ []  [place] [facility]/ [] Home Care/ [] Refused Recommended Disposition /[] [place] [facility]/ [x]  Follow-up with PCP Additional Notes: Pt states that he takes these 4 medications in the morning everyday and falls asleep for 30-40 minutes and then wakes up. Pt is wondering which medication might be causing this. Should he take these in the evening?  Medications taken in the morning are Cardizem, Buspar, Alfuzosin and Omega 3s.  Summary: med questions/ what makes pt sleepy   Pt takes multiple meds, would not be specfic, states he gets sleepy and wants to ask nurse which of his meds are making him sleepy and when he needs to take his meds FU at [phone]      Reason for Disposition  [1] Caller has URGENT medicine question about med that PCP or specialist prescribed AND [2] triager unable to answer question  Answer Assessment - Initial Assessment Questions 1. NAME of MEDICATION: "What medicine are you calling about?"     Cardizem, Buspar, Alfuzosin and Omega 3s 2. QUESTION: "What is your question?" (e.g., double dose of medicine, side effect)     Which one of these might make pt sleepy? 3. PRESCRIBING HCP: "Who prescribed it?" Reason: if prescribed by specialist, call should be referred to that group.     4. SYMPTOMS: "Do you have any symptoms?"     Sleepy for 30-40 minutes after taking 5. SEVERITY: If symptoms are present, ask "Are they mild, moderate or severe?"      6. PREGNANCY:  "Is there any chance that you are pregnant?" "When was your last menstrual period?"  Protocols used: Medication Question Call-A-AH

## 2021-10-10 NOTE — Telephone Encounter (Signed)
Patient called to discuss his medication he think it is making him sleepy.

## 2021-10-12 ENCOUNTER — Ambulatory Visit: Payer: Self-pay | Admitting: *Deleted

## 2021-10-12 NOTE — Telephone Encounter (Signed)
  Chief Complaint: Hard stools Symptoms: Pt states stools are harder than "I would like" Going on "For some time, just thought I'd call." Denies constipation "Just hard to get out." Frequency: "Some time" Pertinent Negatives: Patient denies pain,blood in stools Disposition: [] ED /[] Urgent Care (no appt availability in office) / [] Appointment(In office/virtual)/ []  [place] [facility]/ [x] Home Care/ [] Refused Recommended Disposition /[] [place] [facility]/ []  Follow-up with PCP Additional Notes: Home care provided, advised to CB if symptoms persist.  Reason for Disposition  Over-The-Counter (OTC) medicines for constipation, questions about  Answer Assessment - Initial Assessment Questions 1. STOOL PATTERN OR FREQUENCY: "How often do you have a bowel movement (BM)?"  (Normal range: 3 times a day to every 3 days)  "When was your last BM?"       *No Answer* 2. STRAINING: "Do you have to strain to have a BM?"      *No Answer* 3. RECTAL PAIN: "Does your rectum hurt when the stool comes out?" If Yes, ask: "Do you have hemorrhoids? How bad is the pain?"  (Scale 1-10; or mild, moderate, severe)     No 4. STOOL COMPOSITION: "Are the stools hard?"      Yes 5. BLOOD ON STOOLS: "Has there been any blood on the toilet tissue or on the surface of the BM?" If Yes, ask: "When was the last time?"      no 6. CHRONIC CONSTIPATION: "Is this a new problem for you?"  If no, ask: "How long have you had this problem?" (days, weeks, months)      no 7. CHANGES IN DIET OR HYDRATION: "Have there been any recent changes in your diet?" "How much fluids are you drinking on a daily basis?"  "How much have you had to drink today?"     no 8. MEDICATIONS: "Have you been taking any new medications?" "Are you taking any narcotic pain medications?" (e.g., Vicodin, Percocet, morphine, Dilaudid)     no 9. LAXATIVES: "Have you been using any stool softeners, laxatives, or enemas?"  If yes, ask "What, how often, and when was  the last time?"     no 10. ACTIVITY:  "How much walking do you do every day?"  "Has your activity level decreased in the past week?"        active 11. CAUSE: "What do you think is causing the constipation?"        Been going on for a while 12. OTHER SYMPTOMS: "Do you have any other symptoms?" (e.g., abdominal pain, bloating, fever, vomiting)       no 13. MEDICAL HISTORY: "Do you have a history of hemorrhoids, rectal fissures, or rectal surgery or rectal abscess?"         no  Protocols used: Constipation-A-AH

## 2021-10-19 ENCOUNTER — Ambulatory Visit: Payer: Self-pay | Admitting: *Deleted

## 2021-10-19 NOTE — Telephone Encounter (Signed)
Pt called saying he has not had a bowel movement in 2 to 3 days and wants to know what he can take to help him.   CB#  [phone]  Reason for Disposition  MILD constipation  Answer Assessment - Initial Assessment Questions 1. STOOL PATTERN OR FREQUENCY: "How often do you have a bowel movement (BM)?"  (Normal range: 3 times a day to every 3 days)  "When was your last BM?"       2-3 days 2. STRAINING: "Do you have to strain to have a BM?"      yes 3. RECTAL PAIN: "Does your rectum hurt when the stool comes out?" If Yes, ask: "Do you have hemorrhoids? How bad is the pain?"  (Scale 1-10; or mild, moderate, severe)     no 4. STOOL COMPOSITION: "Are the stools hard?"      Normally soft 5. BLOOD ON STOOLS: "Has there been any blood on the toilet tissue or on the surface of the BM?" If Yes, ask: "When was the last time?"     no 6. CHRONIC CONSTIPATION: "Is this a new problem for you?"  If No, ask: "How long have you had this problem?" (days, weeks, months)      no 7. CHANGES IN DIET OR HYDRATION: "Have there been any recent changes in your diet?" "How much fluids are you drinking on a daily basis?"  "How much have you had to drink today?"     No changes- hydrating, drinks water daily 8. MEDICINES: "Have you been taking any new medicines?" "Are you taking any narcotic pain medicines?" (e.g., Dilaudid, morphine, Percocet, Vicodin)     no 9. LAXATIVES: "Have you been using any stool softeners, laxatives, or enemas?"  If Yes, ask "What, how often, and when was the last time?"     No 10. ACTIVITY:  "How much walking do you do every day?"  "Has your activity level decreased in the past week?"        As able 11. CAUSE: "What do you think is causing the constipation?"        unsure 12. OTHER SYMPTOMS: "Do you have any other symptoms?" (e.g., abdomen pain, bloating, fever, vomiting)       no 13. MEDICAL HISTORY: "Do you have a history of hemorrhoids, rectal fissures, or rectal surgery or rectal  abscess?"         no 14. PREGNANCY: "Is there any chance you are pregnant?" "When was your last menstrual period?"  Protocols used: Constipation-A-AH

## 2021-10-19 NOTE — Telephone Encounter (Signed)
  Chief Complaint: constipation Symptoms: no BM for 2-3 days- no uncommon for patient- but he does not want to get in trouble so wants to take something now. Frequency:   Pertinent Negatives: Patient denies abdomen pain, bloating, fever, vomiting Disposition: [] ED /[] Urgent Care (no appt availability in office) / [] Appointment(In office/virtual)/ []  [place] [facility]/ [x] Home Care/ [] Refused Recommended Disposition /[] [place] [facility]/ []  Follow-up with PCP Additional Notes: Advised per protocol- Miralax may work well for patient- advised with any OTC medication- check with pharmacist for medication interactions.

## 2021-10-20 DIAGNOSIS — R5381 Other malaise: Secondary | ICD-10-CM | POA: Diagnosis not present

## 2021-10-20 NOTE — Telephone Encounter (Signed)
Called Jeffery Campbell NA

## 2021-10-23 ENCOUNTER — Other Ambulatory Visit: Payer: Self-pay | Admitting: Family Medicine

## 2021-10-23 DIAGNOSIS — J310 Chronic rhinitis: Secondary | ICD-10-CM

## 2021-11-01 DIAGNOSIS — Z87898 Personal history of other specified conditions: Secondary | ICD-10-CM | POA: Diagnosis not present

## 2021-11-01 DIAGNOSIS — Z09 Encounter for follow-up examination after completed treatment for conditions other than malignant neoplasm: Secondary | ICD-10-CM | POA: Diagnosis not present

## 2021-11-02 ENCOUNTER — Ambulatory Visit: Payer: PPO

## 2021-11-09 ENCOUNTER — Other Ambulatory Visit: Payer: Self-pay | Admitting: Family Medicine

## 2021-11-09 DIAGNOSIS — J329 Chronic sinusitis, unspecified: Secondary | ICD-10-CM

## 2021-11-09 NOTE — Telephone Encounter (Signed)
Requested Prescriptions  Pending Prescriptions Disp Refills  . fluticasone (FLONASE) 50 MCG/ACT nasal spray [Pharmacy Med Name: FLUTICASONE PROP 50 MCG SPRAY] 48 mL 2    Sig: PLACE 2 SPRAYS INTO BOTH NOSTRILS DAILY AS NEEDED FOR ALLERGIES OR RHINITIS.     Ear, Nose, and Throat: Nasal Preparations - Corticosteroids Passed - 11/09/2021  2:12 AM      Passed - Valid encounter within last 12 months    Recent Outpatient Visits          1 month ago Urinary frequency   [facility] [place] Dense M, DO   2 months ago Acute cystitis without hematuria   [facility] Berniece Salines, [name]   5 months ago Chills (without fever)   [facility] Berniece Salines, [name]   6 months ago Essential hypertension   [facility] Berniece Salines, [name]   6 months ago Chills   [facility] Berniece Salines, [name]      Future Appointments            In 2 weeks [name], Elana Alm [facility]

## 2021-11-14 DIAGNOSIS — H6121 Impacted cerumen, right ear: Secondary | ICD-10-CM | POA: Diagnosis not present

## 2021-11-14 DIAGNOSIS — I129 Hypertensive chronic kidney disease with stage 1 through stage 4 chronic kidney disease, or unspecified chronic kidney disease: Secondary | ICD-10-CM | POA: Diagnosis not present

## 2021-11-14 DIAGNOSIS — I739 Peripheral vascular disease, unspecified: Secondary | ICD-10-CM | POA: Diagnosis not present

## 2021-11-14 DIAGNOSIS — N183 Chronic kidney disease, stage 3 unspecified: Secondary | ICD-10-CM | POA: Diagnosis not present

## 2021-11-14 DIAGNOSIS — Z6829 Body mass index (BMI) 29.0-29.9, adult: Secondary | ICD-10-CM | POA: Diagnosis not present

## 2021-11-16 ENCOUNTER — Encounter: Payer: Self-pay | Admitting: Internal Medicine

## 2021-11-20 ENCOUNTER — Ambulatory Visit: Payer: Self-pay | Admitting: *Deleted

## 2021-11-20 NOTE — Telephone Encounter (Signed)
  Chief Complaint: "Feel bad in morning" Symptoms: Evasive historian. States "I just feel bad for about 10-15 minutes in the morning, then I feel fine after 10-15 minutes."States sleeps well, no trouble falling asleep, does not wake up during night. Frequency:  States 2 episodes over 1 week or so.  Pertinent Negatives: Patient denies dizziness, aching,headache, cold symptoms, pain. "Just feel bad." Disposition: [] ED /[] Urgent Care (no appt availability in office) / [] Appointment(In office/virtual)/ []  [place] [facility]/ [] Home Care/ [] Refused Recommended Disposition /[] [place] [facility]/ [x]  Follow-up with PCP Additional Notes: Able to ascertain more fatigued "But fine once I'm up. "Just can't get going for a few minutes." States "If it happens again I'll call in the morning." WOuld ike Ms. Penders thoughts. Reason for Disposition  Nursing judgment or information in reference  Answer Assessment - Initial Assessment Questions 1. REASON FOR CALL: "What is your main concern right now?"     "Feel bad in mornings for about 10-15 minutes 2. ONSET: "When did the *No Answer* start?"     2 x's in last week or  so  Protocols used: No Guideline Available-A-AH

## 2021-11-21 NOTE — Telephone Encounter (Signed)
Left message for patient on vm

## 2021-11-21 NOTE — Telephone Encounter (Signed)
FYI

## 2021-11-23 ENCOUNTER — Ambulatory Visit: Payer: PPO | Admitting: Urology

## 2021-11-27 NOTE — Progress Notes (Unsigned)
11/28/2021  2:27 PM   Jeffery Campbell 1929/03/24 [id number]  Referring provider: [facility] [address] [place],  [place] [address]   Benign Prostatic Hypertrophy   Urological history: 1. BPH with LU TS -I PSS 2/1 -alfuzosin 10 mg daily and finasteride 5 mg daily -PVR 18 mL   HPI: Jeffery Campbell is a 88 y.o. male who presents for a yearly visit.   In the interim, he has been diagnosed with 2 urinary tract infections.  In May, his urine culture was positive for E. coli and in June, his urine culture was positive for Citrobacter freundii.  His symptoms consisted of increase in nocturia and urinary frequency.  He is taking the finasteride 5 mg and alfuzosin 10 mg daily.  He is also taking an OTC supplement.  He states the nocturia and urinary frequency has abated since taking the OTC supplement.  Patient denies any modifying or aggravating factors.  Patient denies any gross hematuria, dysuria or suprapubic/flank pain.  Patient denies any fevers, chills, nausea or vomiting.    PVR 18 mL     IPSS     Row Name 11/28/21 1400         International Prostate Symptom Score   How often have you had the sensation of not emptying your bladder? Not at All     How often have you had to urinate less than every two hours? Not at All     How often have you found you stopped and started again several times when you urinated? Not at All     How often have you found it difficult to postpone urination? Not at All     How often have you had a weak urinary stream? Not at All     How often have you had to strain to start urination? Not at All     How many times did you typically get up at night to urinate? 2 Times     Total IPSS Score 2       Quality of Life due to urinary symptoms   If you were to spend the rest of your life with your urinary condition just the way it is now how would you feel about that? Pleased                Score:  1-7 Mild 8-19  Moderate 20-35 Severe   PMH: Past Medical History:  Diagnosis Date   Arrhythmia    Cough    CHRONIC   Depression    Disturbance of sleep    Diverticulitis    GERD (gastroesophageal reflux disease)    Hemorrhoids    Hypertension    OA (osteoarthritis)    Pure hypercholesterolemia    Sleep apnea    Stroke [facility])     Surgical History: Past Surgical History:  Procedure Laterality Date   CATARACT EXTRACTION W/PHACO Left 02/14/2016   Procedure: CATARACT EXTRACTION PHACO AND INTRAOCULAR LENS PLACEMENT (IOC);  Surgeon: Galen Manila, MD;  Location: [facility] ORS;  Service: Ophthalmology;  Laterality: Left;  [place] 53.7 AP% 21.5 CDE 11.58 FLUID PACK LOT # [id number]   CATARACT EXTRACTION W/PHACO Right 12/18/2018   Procedure: CATARACT EXTRACTION PHACO AND INTRAOCULAR LENS PLACEMENT (IOC)  RIGHT, VISION BLUE;  Surgeon: Elliot Cousin, MD;  Location: [facility] ORS;  Service: Ophthalmology;  Laterality: Right;  [place]  01:14 CDE 11.82 Fluid pack lot # [id number]   COLONOSCOPY     KNEE ARTHROSCOPY  TRANSURETHRAL RESECTION OF PROSTATE      Home Medications:  Allergies as of 11/28/2021   No Known Allergies      Medication List        Accurate as of November 28, 2021  2:27 PM. If you have any questions, ask your nurse or doctor.          acetaminophen 325 MG tablet Commonly known as: TYLENOL Take 325-650 mg by mouth every 6 (six) hours as needed for moderate pain or headache.   alfuzosin 10 MG 24 hr tablet Commonly known as: UROXATRAL Take 1 tablet (10 mg total) by mouth daily with breakfast.   aspirin EC 81 MG tablet Take 1 tablet (81 mg total) by mouth daily. Swallow whole.   atorvastatin 20 MG tablet Commonly known as: LIPITOR TAKE 1 TABLET BY MOUTH EVERYDAY AT BEDTIME   busPIRone 10 MG tablet Commonly known as: BUSPAR Take 1 tablet (10 mg total) by mouth 2 (two) times daily as needed (for anxiety).   diltiazem 120 MG 24 hr capsule Commonly known as: CARDIZEM CD TAKE 1 CAPSULE  BY MOUTH EVERY DAY   finasteride 5 MG tablet Commonly known as: PROSCAR Take 1 tablet (5 mg total) by mouth daily.   Fish Oil 1200 MG Caps Take 2 capsules (2,400 mg total) by mouth in the morning and at bedtime.   fluticasone 50 MCG/ACT nasal spray Commonly known as: FLONASE PLACE 2 SPRAYS INTO BOTH NOSTRILS DAILY AS NEEDED FOR ALLERGIES OR RHINITIS.   levocetirizine 5 MG tablet Commonly known as: XYZAL TAKE 1 TABLET BY MOUTH EVERY DAY IN THE EVENING   meclizine 12.5 MG tablet Commonly known as: ANTIVERT Take 1 tablet (12.5 mg total) by mouth daily as needed for dizziness (vertigo episodes). Use sparingly.  Must get appointment if using daily or symptoms worsen   Systane Balance 0.6 % Soln Generic drug: Propylene Glycol Place 1 drop into both eyes 3 (three) times daily as needed (dry eyes).        Allergies: No Known Allergies  Family History: Family History  Problem Relation Age of Onset   Hypertension Son        Posey Rea of parents health.   Prostate cancer Neg Hx    Bladder Cancer Neg Hx    Kidney cancer Neg Hx     Social History:  reports that he has never smoked. He has never used smokeless tobacco. He reports that he does not drink alcohol and does not use drugs.  ROS: For pertinent review of systems please refer to history of present illness  Physical Exam: BP 126/70   Pulse 76   Ht 6' (1.829 m)   Wt 225 lb (102.1 kg)   BMI 30.52 kg/m   Constitutional:  Well nourished. Alert and oriented, No acute distress. HEENT: [place] AT, moist mucus membranes.  Trachea midline Cardiovascular: No clubbing, cyanosis, or edema. Respiratory: Normal respiratory effort, no increased work of breathing. Neurologic: Grossly intact, no focal deficits, moving all 4 extremities. Psychiatric: Normal mood and affect.   Laboratory Data: Lab Results  Component Value Date   WBC 6.5 04/18/2021   HGB 17.9 (H) 04/18/2021   HCT 51.8 (H) 04/18/2021   MCV 90.9 04/18/2021   PLT 228  04/18/2021    Lab Results  Component Value Date   CREATININE 1.15 04/18/2021  I have reviewed the labs.   Pertinent imaging Results for orders placed or performed in visit on 11/28/21  Bladder Scan (Post Void Residual) in office  Result Value Ref Range   Scan Result 1mL    Assessment & Plan:    1. rUTI's vs ASB -Both urine cultures were obtained when patient was having worsening of nocturia and urinary frequency without other symptoms of UTI, such as hematuria, dysuria, leukocytosis, fevers or chills -Symptoms of nocturia and frequency may have been due to some other factors, but at the time being they have resolved since he has been taking the OTC supplement -Bladder scan today notes adequate emptying, so his symptoms are not due to bladder outlet obstruction or overflow incontinence -Continue to monitor, we will not pursue more aggressive work-up at this time due to patient's age unless he continues to have worsening urinary symptoms or symptoms of an UTI   2. BPH with LUTS -PVR < 300 cc -At goal with his urinary symptoms -continue conservative management, avoiding bladder irritants and timed voiding's -Continue alfuzosin 10 mg daily and finasteride 5 mg daily    Return in about 1 year (around 11/29/2022) for IPSS, PVR .  These notes generated with voice recognition software. I apologize for typographical errors.  Michiel Cowboy, PA-C  [facility] [address]  [address] [place], [place] [address] [phone]

## 2021-11-28 ENCOUNTER — Encounter: Payer: Self-pay | Admitting: Urology

## 2021-11-28 ENCOUNTER — Ambulatory Visit (INDEPENDENT_AMBULATORY_CARE_PROVIDER_SITE_OTHER): Payer: PPO | Admitting: Urology

## 2021-11-28 VITALS — BP 126/70 | HR 76 | Ht 72.0 in | Wt 225.0 lb

## 2021-11-28 DIAGNOSIS — N138 Other obstructive and reflux uropathy: Secondary | ICD-10-CM

## 2021-11-28 DIAGNOSIS — R8271 Bacteriuria: Secondary | ICD-10-CM

## 2021-11-28 DIAGNOSIS — N401 Enlarged prostate with lower urinary tract symptoms: Secondary | ICD-10-CM | POA: Diagnosis not present

## 2021-11-28 DIAGNOSIS — R351 Nocturia: Secondary | ICD-10-CM

## 2021-11-28 DIAGNOSIS — N390 Urinary tract infection, site not specified: Secondary | ICD-10-CM

## 2021-11-28 DIAGNOSIS — R350 Frequency of micturition: Secondary | ICD-10-CM

## 2021-11-28 LAB — BLADDER SCAN AMB NON-IMAGING

## 2021-11-28 MED ORDER — FINASTERIDE 5 MG PO TABS: 5.0000 mg | ORAL_TABLET | Freq: Every day | ORAL | 3 refills | Status: DC

## 2021-11-28 MED ORDER — ALFUZOSIN HCL ER 10 MG PO TB24: 10.0000 mg | ORAL_TABLET | Freq: Every day | ORAL | 3 refills | Status: DC

## 2021-12-04 ENCOUNTER — Other Ambulatory Visit: Payer: Self-pay | Admitting: Nurse Practitioner

## 2021-12-06 NOTE — Telephone Encounter (Signed)
Requested Prescriptions  Pending Prescriptions Disp Refills  . diltiazem (CARDIZEM CD) 120 MG 24 hr capsule [Pharmacy Med Name: DILTIAZEM 24H ER(CD) 120 MG CP] 90 capsule 1    Sig: TAKE 1 CAPSULE BY MOUTH EVERY DAY     Cardiovascular: Calcium Channel Blockers 3 Failed - 12/04/2021  2:32 AM      Failed - ALT in normal range and within 360 days    ALT  Date Value Ref Range Status  04/18/2021 6 (L) 9 - 46 U/L Final         Failed - AST in normal range and within 360 days    AST  Date Value Ref Range Status  04/18/2021 9 (L) 10 - 35 U/L Final         Passed - Cr in normal range and within 360 days    Creat  Date Value Ref Range Status  04/18/2021 1.15 0.70 - 1.22 mg/dL Final         Passed - Last BP in normal range    BP Readings from Last 1 Encounters:  11/28/21 126/70         Passed - Last Heart Rate in normal range    Pulse Readings from Last 1 Encounters:  11/28/21 76         Passed - Valid encounter within last 6 months    Recent Outpatient Visits          2 months ago Urinary frequency   [facility] Caro Lar[place], DO   3 months ago Acute cystitis without hematuria   [facility] Berniece Salines, [name]   6 months ago Chills (without fever)   [facility] Berniece Salines, [name]   7 months ago Essential hypertension   [facility] Berniece Salines, [name]   7 months ago Chills   [facility] Berniece Salines, [name]      Future Appointments            In 11 months [name], Elana Alm [facility]

## 2021-12-18 DIAGNOSIS — R059 Cough, unspecified: Secondary | ICD-10-CM | POA: Diagnosis not present

## 2021-12-25 DIAGNOSIS — M3501 Sicca syndrome with keratoconjunctivitis: Secondary | ICD-10-CM | POA: Diagnosis not present

## 2021-12-27 DIAGNOSIS — Z8709 Personal history of other diseases of the respiratory system: Secondary | ICD-10-CM | POA: Diagnosis not present

## 2021-12-27 DIAGNOSIS — Z09 Encounter for follow-up examination after completed treatment for conditions other than malignant neoplasm: Secondary | ICD-10-CM | POA: Diagnosis not present

## 2022-02-14 ENCOUNTER — Telehealth: Payer: Self-pay | Admitting: Nurse Practitioner

## 2022-02-14 DIAGNOSIS — J329 Chronic sinusitis, unspecified: Secondary | ICD-10-CM

## 2022-02-14 NOTE — Telephone Encounter (Signed)
Requested medication (s) are due for refill today: yes  Requested medication (s) are on the active medication list: yes  Last refill:  10/23/21  Future visit scheduled: no  Notes to clinic:  Unable to refill per protocol, sig needed. Routing for review.     Requested Prescriptions  Pending Prescriptions Disp Refills   levocetirizine (XYZAL) 5 MG tablet 90 tablet 1     Ear, Nose, and Throat:  Antihistamines - levocetirizine dihydrochloride Passed - 02/14/2022 12:27 PM      Passed - Cr in normal range and within 360 days    Creat  Date Value Ref Range Status  04/18/2021 1.15 0.70 - 1.22 mg/dL Final         Passed - eGFR is 10 or above and within 360 days    GFR, Est African American  Date Value Ref Range Status  11/12/2019 63 > OR = 60 mL/min/1.71m Final   GFR, Est Non African American  Date Value Ref Range Status  11/12/2019 54 (L) > OR = 60 mL/min/1.751mFinal   GFR, Estimated  Date Value Ref Range Status  02/08/2021 >60 >60 mL/min Final    Comment:    (NOTE) Calculated using the CKD-EPI Creatinine Equation (2021)    eGFR  Date Value Ref Range Status  04/18/2021 60 > OR = 60 mL/min/1.7362minal    Comment:    The eGFR is based on the CKD-EPI 2021 equation. To calculate  the new eGFR from a previous Creatinine or Cystatin C result, go to https://www.kidney.org/professionals/ kdoqi/gfr%5Fcalculator          Passed - Valid encounter within last 12 months    Recent Outpatient Visits           4 months ago [facility]   [facility]   6 months ago [facility]stit[facility] hematuria   [facility]   9 months ago [facility] fever)   [facility]   9 months ago Essential hypertension   [facility]   [facility]       Future  Appointments             In 9 months [name]

## 2022-02-14 NOTE — Telephone Encounter (Unsigned)
Copied from CRM [id number]. Topic: General - Other >> Feb 14, 2022  9:46 AM Everette C wrote: Reason for CRM: Medication Refill - Medication: levocetirizine (XYZAL) 5 MG tablet [[id number]]  Has the patient contacted their pharmacy? Yes.  The patient has been directed to contact their PCP  (Agent: If no, request that the patient contact the pharmacy for the refill. If patient does not wish to contact the pharmacy document the reason why and proceed with request.) (Agent: If yes, when and what did the pharmacy advise?)  Preferred Pharmacy (with phone number or street name): [facility] [address], [place] - [address] [place] [address] Phone: [phone] Fax: [phone] Hours: Not open 24 hours   Has the patient been seen for an appointment in the last year OR does the patient have an upcoming appointment? Yes.    Agent: Please be advised that RX refills may take up to 3 business days. We ask that you follow-up with your pharmacy.

## 2022-02-14 NOTE — Telephone Encounter (Signed)
Spoke to the pharmacy and pt has a refill of his  levocetirizine (XYZAL) 5 MG tablet   On file.  They will get that ready and pt is aware. Nothing further needed.

## 2022-02-15 DIAGNOSIS — R6883 Chills (without fever): Secondary | ICD-10-CM | POA: Diagnosis not present

## 2022-02-20 DIAGNOSIS — Z87898 Personal history of other specified conditions: Secondary | ICD-10-CM | POA: Diagnosis not present

## 2022-02-20 DIAGNOSIS — Z09 Encounter for follow-up examination after completed treatment for conditions other than malignant neoplasm: Secondary | ICD-10-CM | POA: Diagnosis not present

## 2022-03-24 ENCOUNTER — Other Ambulatory Visit: Payer: Self-pay | Admitting: Family Medicine

## 2022-03-24 DIAGNOSIS — F419 Anxiety disorder, unspecified: Secondary | ICD-10-CM

## 2022-04-08 DIAGNOSIS — R197 Diarrhea, unspecified: Secondary | ICD-10-CM | POA: Diagnosis not present

## 2022-04-13 ENCOUNTER — Ambulatory Visit: Payer: Self-pay

## 2022-04-13 NOTE — Telephone Encounter (Signed)
Message from Volta Callas sent at 04/13/2022 11:31 AM EST  Summary: constipation   Pt called with constipation.  He want to know what he can take.  Please advise.  CB@  [phone]         Chief Complaint: had diarrhea 2 days ago and no BM since, wanting prescription for laxative. Symptoms: no BM x 2 days- stated during call he thinks he will have a BM today Frequency: 2 days Pertinent Negatives: Patient denies abdominal pain Disposition: [] ED /[] Urgent Care (no appt availability in office) / [] Appointment(In office/virtual)/ []  [place] [facility]/ [x] Home Care/ [] Refused Recommended Disposition /[] [place] [facility]/ []  Follow-up with PCP Additional Notes: advised pt to but Metamucil for fiber to help with BM. Advised no laxatives.   Reason for Disposition  Over-The-Counter (OTC) medicines for constipation, questions about  Answer Assessment - Initial Assessment Questions 1. STOOL PATTERN OR FREQUENCY: "How often do you have a bowel movement (BM)?"  (Normal range: 3 times a day to every 3 days)  "When was your last BM?"       2 days 2. STRAINING: "Do you have to strain to have a BM?"      no 3. RECTAL PAIN: "Does your rectum hurt when the stool comes out?" If Yes, ask: "Do you have hemorrhoids? How bad is the pain?"  (Scale 1-10; or mild, moderate, severe)     no 4. STOOL COMPOSITION: "Are the stools hard?"      no 5. BLOOD ON STOOLS: "Has there been any blood on the toilet tissue or on the surface of the BM?" If Yes, ask: "When was the last time?"     no 6. CHRONIC CONSTIPATION: "Is this a new problem for you?"  If No, ask: "How long have you had this problem?" (days, weeks, months)      Has not has BM in 2 days- had diarrhea 2 days ago and no BM since 7. CHANGES IN DIET OR HYDRATION: "Have there been any recent changes in your diet?" "How much fluids are you drinking on a daily basis?"  "How much have you had to drink today?"     N/a 8. MEDICINES: "Have you been  taking any new medicines?" "Are you taking any narcotic pain medicines?" (e.g., Dilaudid, morphine, Percocet, Vicodin)     N/a 9. LAXATIVES: "Have you been using any stool softeners, laxatives, or enemas?"  If Yes, ask "What, how often, and when was the last time?"     no 10. ACTIVITY:  "How much walking do you do every day?"  "Has your activity level decreased in the past week?"        N/a 11. CAUSE: "What do you think is causing the constipation?"        N/a 12. OTHER SYMPTOMS: "Do you have any other symptoms?" (e.g., abdomen pain, bloating, fever, vomiting)       denies 13. MEDICAL HISTORY: "Do you have a history of hemorrhoids, rectal fissures, or rectal surgery or rectal abscess?"         N/a 14. PREGNANCY: "Is there any chance you are pregnant?" "When was your last menstrual period?"       N/a  Protocols used: Constipation-A-AH

## 2022-04-17 DIAGNOSIS — Z8739 Personal history of other diseases of the musculoskeletal system and connective tissue: Secondary | ICD-10-CM | POA: Diagnosis not present

## 2022-04-17 DIAGNOSIS — Z09 Encounter for follow-up examination after completed treatment for conditions other than malignant neoplasm: Secondary | ICD-10-CM | POA: Diagnosis not present

## 2022-05-04 ENCOUNTER — Ambulatory Visit (INDEPENDENT_AMBULATORY_CARE_PROVIDER_SITE_OTHER): Payer: PPO

## 2022-05-04 VITALS — Ht 72.0 in | Wt 225.0 lb

## 2022-05-04 DIAGNOSIS — Z0000 Encounter for general adult medical examination without abnormal findings: Secondary | ICD-10-CM

## 2022-05-04 NOTE — Patient Instructions (Signed)
Mr. Jeffery Campbell , Thank you for taking time to come for your Medicare Wellness Visit. I appreciate your ongoing commitment to your health goals. Please review the following plan we discussed and let me know if I can assist you in the future.   These are the goals we discussed:  Goals       DIET - INCREASE WATER INTAKE      Recommend to drink at least 6-8 8oz glasses of water per day.      There is other people that are calling me from [facility] (pt-stated)      Current Barriers:  Knowledge Deficits related to understanding [facility] services  Nurse Case Manager Clinical Goal(s):  Over the next 30 days, patient will work with [facility]  to address needs related to chronic health management  Interventions:  Advised patient to continue to engage with [facility] Provided education to patient re: services [facility] provides Reviewed medications with patient and discussed importance of medication adherence Discussed plans with patient for ongoing care management follow up and provided patient with direct contact information for care management team  Patient Self Care Activities:  Self administers medications as prescribed Attends all scheduled provider appointments Calls pharmacy for medication refills Attends church or other social activities Performs ADL's independently Performs IADL's independently Calls provider office for new concerns or questions  Continues to Engage with [facility]  Initial goal documentation         This is a list of the screening recommended for you and due dates:  Health Maintenance  Topic Date Due   Zoster (Shingles) Vaccine (1 of 2) Never done   DTaP/Tdap/Td vaccine (2 - Tdap) 02/27/2015   COVID-19 Vaccine (6 - 2023-24 season) 03/07/2022   Medicare Annual Wellness Visit  05/05/2023   Pneumonia Vaccine  Completed   Flu Shot  Completed   HPV Vaccine  Aged Out    Advanced directives: yes  Conditions/risks identified: none  Next appointment: Follow  up in one year for your annual wellness visit. 05/09/2023 @0900 Jeffery Campbell  Preventive Care 65 Years and Older, Male  Preventive care refers to lifestyle choices and visits with your health care provider that can promote health and wellness. What does preventive care include? A yearly physical exam. This is also called an annual well check. Dental exams once or twice a year. Routine eye exams. Ask your health care provider how often you should have your eyes checked. Personal lifestyle choices, including: Daily care of your teeth and gums. Regular physical activity. Eating a healthy diet. Avoiding tobacco and drug use. Limiting alcohol use. Practicing safe sex. Taking low doses of aspirin every day. Taking vitamin and mineral supplements as recommended by your health care provider. What happens during an annual well check? The services and screenings done by your health care provider during your annual well check will depend on your age, overall health, lifestyle risk factors, and family history of disease. Counseling  Your health care provider may ask you questions about your: Alcohol use. Tobacco use. Drug use. Emotional well-being. Home and relationship well-being. Sexual activity. Eating habits. History of falls. Memory and ability to understand (cognition). Work and work Statistician. Screening  You may have the following tests or measurements: Height, weight, and BMI. Blood pressure. Lipid and cholesterol levels. These may be checked every 5 years, or more frequently if you are over 15 years old. Skin check. Lung cancer screening. You may have this screening every year starting at age 13 if you have a 30-pack-year  history of smoking and currently smoke or have quit within the past 15 years. Fecal occult blood test (FOBT) of the stool. You may have this test every year starting at age 3. Flexible sigmoidoscopy or colonoscopy. You may have a sigmoidoscopy every 5 years or a  colonoscopy every 10 years starting at age 6. Prostate cancer screening. Recommendations will vary depending on your family history and other risks. Hepatitis C blood test. Hepatitis B blood test. Sexually transmitted disease (STD) testing. Diabetes screening. This is done by checking your blood sugar (glucose) after you have not eaten for a while (fasting). You may have this done every 1-3 years. Abdominal aortic aneurysm (AAA) screening. You may need this if you are a current or former smoker. Osteoporosis. You may be screened starting at age 56 if you are at high risk. Talk with your health care provider about your test results, treatment options, and if necessary, the need for more tests. Vaccines  Your health care provider may recommend certain vaccines, such as: Influenza vaccine. This is recommended every year. Tetanus, diphtheria, and acellular pertussis (Tdap, Td) vaccine. You may need a Td booster every 10 years. Zoster vaccine. You may need this after age 14. Pneumococcal 13-valent conjugate (PCV13) vaccine. One dose is recommended after age 28. Pneumococcal polysaccharide (PPSV23) vaccine. One dose is recommended after age 31. Talk to your health care provider about which screenings and vaccines you need and how often you need them. This information is not intended to replace advice given to you by your health care provider. Make sure you discuss any questions you have with your health care provider. Document Released: 04/15/2015 Document Revised: 12/07/2015 Document Reviewed: 01/18/2015 Elsevier Interactive Patient Education  2017 [employer] Prevention in the Home Falls can cause injuries. They can happen to people of all ages. There are many things you can do to make your home safe and to help prevent falls. What can I do on the outside of my home? Regularly fix the edges of walkways and driveways and fix any cracks. Remove anything that might make you trip as you  walk through a door, such as a raised step or threshold. Trim any bushes or trees on the path to your home. Use bright outdoor lighting. Clear any walking paths of anything that might make someone trip, such as rocks or tools. Regularly check to see if handrails are loose or broken. Make sure that both sides of any steps have handrails. Any raised decks and porches should have guardrails on the edges. Have any leaves, snow, or ice cleared regularly. Use sand or salt on walking paths during winter. Clean up any spills in your garage right away. This includes oil or grease spills. What can I do in the bathroom? Use night lights. Install grab bars by the toilet and in the tub and shower. Do not use towel bars as grab bars. Use non-skid mats or decals in the tub or shower. If you need to sit down in the shower, use a plastic, non-slip stool. Keep the floor dry. Clean up any water that spills on the floor as soon as it happens. Remove soap buildup in the tub or shower regularly. Attach bath mats securely with double-sided non-slip rug tape. Do not have throw rugs and other things on the floor that can make you trip. What can I do in the bedroom? Use night lights. Make sure that you have a light by your bed that is easy to reach. Do  not use any sheets or blankets that are too big for your bed. They should not hang down onto the floor. Have a firm chair that has side arms. You can use this for support while you get dressed. Do not have throw rugs and other things on the floor that can make you trip. What can I do in the kitchen? Clean up any spills right away. Avoid walking on wet floors. Keep items that you use a lot in easy-to-reach places. If you need to reach something above you, use a strong step stool that has a grab bar. Keep electrical cords out of the way. Do not use floor polish or wax that makes floors slippery. If you must use wax, use non-skid floor wax. Do not have throw rugs  and other things on the floor that can make you trip. What can I do with my stairs? Do not leave any items on the stairs. Make sure that there are handrails on both sides of the stairs and use them. Fix handrails that are broken or loose. Make sure that handrails are as long as the stairways. Check any carpeting to make sure that it is firmly attached to the stairs. Fix any carpet that is loose or worn. Avoid having throw rugs at the top or bottom of the stairs. If you do have throw rugs, attach them to the floor with carpet tape. Make sure that you have a light switch at the top of the stairs and the bottom of the stairs. If you do not have them, ask someone to add them for you. What else can I do to help prevent falls? Wear shoes that: Do not have high heels. Have rubber bottoms. Are comfortable and fit you well. Are closed at the toe. Do not wear sandals. If you use a stepladder: Make sure that it is fully opened. Do not climb a closed stepladder. Make sure that both sides of the stepladder are locked into place. Ask someone to hold it for you, if possible. Clearly mark and make sure that you can see: Any grab bars or handrails. First and last steps. Where the edge of each step is. Use tools that help you move around (mobility aids) if they are needed. These include: Canes. Walkers. Scooters. Crutches. Turn on the lights when you go into a dark area. Replace any light bulbs as soon as they burn out. Set up your furniture so you have a clear path. Avoid moving your furniture around. If any of your floors are uneven, fix them. If there are any pets around you, be aware of where they are. Review your medicines with your doctor. Some medicines can make you feel dizzy. This can increase your chance of falling. Ask your doctor what other things that you can do to help prevent falls. This information is not intended to replace advice given to you by your health care provider. Make sure  you discuss any questions you have with your health care provider. Document Released: 01/13/2009 Document Revised: 08/25/2015 Document Reviewed: 04/23/2014 Elsevier Interactive Patient Education  2017 [employer].

## 2022-05-04 NOTE — Progress Notes (Signed)
Virtual Visit via Telephone Note  I connected with  Atiba Kimberlin on 05/04/22 at  9:00 AM EST by telephone and verified that I am speaking with the correct person using two identifiers.  Location: Patient: home Provider: [facility] Persons participating in the virtual visit: patient/Nurse Health Advisor   I discussed the limitations, risks, security and privacy concerns of performing an evaluation and management service by telephone and the availability of in person appointments. The patient expressed understanding and agreed to proceed.  Interactive audio and video telecommunications were attempted between this nurse and patient, however failed, due to patient having technical difficulties OR patient did not have access to video capability.  We continued and completed visit with audio only.  Some vital signs may be absent or patient reported.   Sue Lush, LPN  Subjective:   Ab Leaming is a 89 y.o. male who presents for Medicare Annual/Subsequent preventive examination.  Review of Systems     Cardiac Risk Factors include: advanced age (>51men, >65 women);dyslipidemia;obesity (BMI >30kg/m2)     Objective:    Today's Vitals   05/04/22 0901  Weight: 225 lb (102.1 kg)  Height: 6' (1.829 m)   Body mass index is 30.52 kg/m.     05/04/2022    9:10 AM 02/08/2021    1:24 PM 11/01/2020    2:40 PM 02/10/2020    1:16 PM 10/29/2019    2:21 PM 02/10/2019    2:10 PM 10/28/2018    2:24 PM  Advanced Directives  Does Patient Have a Medical Advance Directive? Yes Yes Yes Yes No Yes Yes  Type of Estate agent of [place];Living will Healthcare Power of [place];Living will Healthcare Power of [place];Living will Healthcare Power of [place];Living will  Healthcare Power of [place];Living will Healthcare Power of [place];Living will  Does patient want to make changes to medical advance directive?  No - Patient declined    No - Patient declined   Copy of Healthcare Power  of Attorney in Chart? No - copy requested No - copy requested No - copy requested No - copy requested  No - copy requested No - copy requested  Would patient like information on creating a medical advance directive?  No - Patient declined  No - Patient declined No - Patient declined No - Patient declined     Current Medications (verified) Outpatient Encounter Medications as of 05/04/2022  Medication Sig   acetaminophen (TYLENOL) 325 MG tablet Take 325-650 mg by mouth every 6 (six) hours as needed for moderate pain or headache.   alfuzosin (UROXATRAL) 10 MG 24 hr tablet Take 1 tablet (10 mg total) by mouth daily with breakfast.   aspirin 81 MG EC tablet Take 1 tablet (81 mg total) by mouth daily. Swallow whole.   atorvastatin (LIPITOR) 20 MG tablet TAKE 1 TABLET BY MOUTH EVERYDAY AT BEDTIME   busPIRone (BUSPAR) 10 MG tablet TAKE 1 TABLET (10 MG TOTAL) BY MOUTH 2 (TWO) TIMES DAILY AS NEEDED (FOR ANXIETY).   diltiazem (CARDIZEM CD) 120 MG 24 hr capsule TAKE 1 CAPSULE BY MOUTH EVERY DAY   finasteride (PROSCAR) 5 MG tablet Take 1 tablet (5 mg total) by mouth daily.   levocetirizine (XYZAL) 5 MG tablet TAKE 1 TABLET BY MOUTH EVERY DAY IN THE EVENING   meclizine (ANTIVERT) 12.5 MG tablet Take 1 tablet (12.5 mg total) by mouth daily as needed for dizziness (vertigo episodes). Use sparingly.  Must get appointment if using daily or symptoms worsen   Omega-3 Fatty  Acids (FISH OIL) 1200 MG CAPS Take 2 capsules (2,400 mg total) by mouth in the morning and at bedtime.   Propylene Glycol (SYSTANE BALANCE) 0.6 % SOLN Place 1 drop into both eyes 3 (three) times daily as needed (dry eyes).   fluticasone (FLONASE) 50 MCG/ACT nasal spray PLACE 2 SPRAYS INTO BOTH NOSTRILS DAILY AS NEEDED FOR ALLERGIES OR RHINITIS. (Patient not taking: Reported on 11/28/2021)   No facility-administered encounter medications on file as of 05/04/2022.    Allergies (verified) Patient has no known allergies.   History: Past Medical  History:  Diagnosis Date   Arrhythmia    Cough    CHRONIC   Depression    Disturbance of sleep    Diverticulitis    GERD (gastroesophageal reflux disease)    Hemorrhoids    Hypertension    OA (osteoarthritis)    Pure hypercholesterolemia    Sleep apnea    Stroke [facility])    Past Surgical History:  Procedure Laterality Date   CATARACT EXTRACTION W/PHACO Left 02/14/2016   Procedure: CATARACT EXTRACTION PHACO AND INTRAOCULAR LENS PLACEMENT ([place]);  Surgeon: Birder Robson, MD;  Location: [facility] ORS;  Service: Ophthalmology;  Laterality: Left;  [place] 53.7 AP% 21.5 CDE 11.58 FLUID PACK LOT # [id number]   CATARACT EXTRACTION W/PHACO Right 12/18/2018   Procedure: CATARACT EXTRACTION PHACO AND INTRAOCULAR LENS PLACEMENT ([place])  RIGHT, VISION BLUE;  Surgeon: Marchia Meiers, MD;  Location: [facility] ORS;  Service: Ophthalmology;  Laterality: Right;  [place]  01:14 CDE 11.82 Fluid pack lot # [id number]   COLONOSCOPY     KNEE ARTHROSCOPY     TRANSURETHRAL RESECTION OF PROSTATE     Family History  Problem Relation Age of Onset   Hypertension Son        Marena Chancy of parents health.   Prostate cancer Neg Hx    Bladder Cancer Neg Hx    Kidney cancer Neg Hx    Social History   Socioeconomic History   Marital status: Divorced    Spouse name: Not on file   Number of children: 3   Years of education: Not on file   Highest education level: 11th grade  Occupational History   Occupation: Retired  Tobacco Use   Smoking status: Never   Smokeless tobacco: Never   Tobacco comments:    smoking cessation materials not required  Vaping Use   Vaping Use: Never used  Substance and Sexual Activity   Alcohol use: No    Alcohol/week: 0.0 standard drinks of alcohol   Drug use: No   Sexual activity: Not Currently  Other Topics Concern   Not on file  Social History Narrative   Lives at home alone.   Writes left-handed - does everything else with right-hand.   No caffeine use.      Never smoker; never alcohol;  used to be truck driver; retired part time from Office manager course. 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    Social Determinants of Health   Financial Resource Strain: Low Risk  (05/04/2022)   Overall Financial Resource Strain (CARDIA)    Difficulty of Paying Living Expenses: Not hard at all  Food Insecurity: No Food Insecurity (05/04/2022)   Hunger Vital Sign    Worried About Running Out of Food in the Last Year: Never true    Ran Out of Food in the Last Year: Never true  Transportation Needs: No Transportation Needs (05/04/2022)   PRAPARE - Hydrologist (Medical): No    Lack of Transportation (  Non-Medical): No  Physical Activity: Insufficiently Active (05/04/2022)   Exercise Vital Sign    Days of Exercise per Week: 2 days    Minutes of Exercise per Session: 30 min  Stress: No Stress Concern Present (05/04/2022)   [place]    Feeling of Stress : Not at all  Social Connections: Moderately Isolated (05/04/2022)   Social Connection and Isolation Panel [NHANES]    Frequency of Communication with Friends and Family: More than three times a week    Frequency of Social Gatherings with Friends and Family: Three times a week    Attends Religious Services: More than 4 times per year    Active Member of Clubs or Organizations: No    Attends Archivist Meetings: Never    Marital Status: Divorced    Tobacco Counseling Counseling given: Not Answered Tobacco comments: smoking cessation materials not required   Clinical Intake:  Pre-visit preparation completed: Yes  Pain : No/denies pain     BMI - recorded: 30.52 Nutritional Status: BMI > 30  Obese Nutritional Risks: None Diabetes: No  How often do you need to have someone help you when you read instructions, pamphlets, or other written materials from your doctor or pharmacy?: 1 - Never  Diabetic?no  Interpreter Needed?: No  Information entered by ::  B.Mariell Nester,LPN   Activities of Daily Living    05/04/2022    9:10 AM 09/20/2021    1:38 PM  In your present state of health, do you have any difficulty performing the following activities:  Hearing? 0 1  Vision? 0 0  Difficulty concentrating or making decisions? 0 1  Walking or climbing stairs? 0 1  Dressing or bathing? 0 0  Doing errands, shopping? 0 0  Preparing Food and eating ? N   Using the Toilet? N   In the past six months, have you accidently leaked urine? N   Do you have problems with loss of bowel control? N   Managing your Medications? N   Managing your Finances? N   Housekeeping or managing your Housekeeping? N     Patient Care Team: Bo Merino, [name] as PCP - General (Nurse Practitioner) Cammie Sickle, MD as Consulting Physician (Hematology and Oncology) Laneta Simmers as Physician Assistant (Urology) Birder Robson, MD as Referring Physician (Ophthalmology)  Indicate any recent Medical Services you may have received from other than [facility] providers in the past year (date may be approximate).     Assessment:   This is a routine wellness examination for Blessed.  Hearing/Vision screen Hearing Screening - Comments:: Hearing adequate Vision Screening - Comments:: Eye adequate [facility] next appt in 2 months  Dietary issues and exercise activities discussed: Current Exercise Habits: Home exercise routine, Type of exercise: walking, Exercise limited by: None identified   Goals Addressed   None    Depression Screen    05/04/2022    9:06 AM 09/20/2021    1:37 PM 08/18/2021    1:24 PM 05/16/2021    2:48 PM 05/03/2021   10:22 AM 04/18/2021   12:40 PM 01/31/2021   10:42 AM  PHQ 2/9 Scores  PHQ - 2 Score 0 0 0 0 1 1 0  PHQ- 9 Score   0 0 1 2 1     Fall Risk    05/04/2022    9:04 AM 09/20/2021    1:37 PM 08/18/2021    1:24 PM 05/16/2021    2:48  PM 05/03/2021   10:21 AM  Fall Risk   Falls in the past year? 0 0 0 0 0  Number falls in past yr: 0 0  0 0 0  Injury with Fall? 0 0 0 0 0  Risk for fall due to : No Fall Risks No Fall Risks  No Fall Risks   Follow up Education provided;Falls prevention discussed Falls prevention discussed  Falls prevention discussed Falls evaluation completed    FALL RISK PREVENTION PERTAINING TO THE HOME:  Any stairs in or around the home? No  If so, are there any without handrails? No  Home free of loose throw rugs in walkways, pet beds, electrical cords, etc? Yes  Adequate lighting in your home to reduce risk of falls? Yes   ASSISTIVE DEVICES UTILIZED TO PREVENT FALLS:  Life alert? YES Use of a cane, walker or w/c? Yes sometimes Grab bars in the bathroom? Yes  Shower chair or bench in shower? Yes  Elevated toilet seat or a handicapped toilet? No   Cognitive Function:       10/26/2014    9:42 AM  MMSE - Mini Mental State Exam  Orientation to time 4  Orientation to Place 4  Registration 3  Attention/ Calculation 3  Recall 1  Language- name 2 objects 2  Language- repeat 1  Language- follow 3 step command 3  Language- read & follow direction 1  Write a sentence 1  Copy design 1  Total score 24        05/04/2022    9:11 AM 10/29/2019    2:24 PM 10/28/2018    2:29 PM 10/24/2017    8:40 AM  6CIT Screen  What Year? 0 points 0 points 0 points 0 points  What month? 0 points 0 points 0 points 0 points  What time? 0 points 0 points 0 points 3 points  Count back from 20 0 points 0 points 0 points 0 points  Months in reverse  2 points 2 points 4 points  Repeat phrase  4 points 6 points 2 points  Total Score  6 points 8 points 9 points    Immunizations Immunization History  Administered Date(s) Administered   Fluad Quad(high Dose 65+) 12/13/2020, 01/10/2022   Influenza Split 02/17/2006, 12/30/2006, 01/20/2010   Influenza, High Dose Seasonal PF 12/29/2014, 12/24/2016, 11/29/2017, 12/01/2018   Influenza, Seasonal, Injecte, Preservative Fre 01/24/2011, 01/02/2012, 12/24/2012    Influenza,inj,Quad PF,6+ Mos 12/09/2013   Influenza-Unspecified 12/24/2016, 11/14/2017, 12/17/2019   Moderna Sars-Covid-2 Vaccination 04/14/2019, 05/12/2019, 02/17/2020, 07/19/2020, 01/10/2022   Pneumococcal Conjugate-13 01/25/2014, 03/17/2014   Pneumococcal Polysaccharide-23 09/23/2006, 12/30/2006   Td 02/26/2005   Zoster, Live 01/24/2011    TDAP status: Up to date  Flu Vaccine status: Up to date  Pneumococcal vaccine status: Up to date  Covid-19 vaccine status: Completed vaccines  Qualifies for Shingles Vaccine? Yes   Zostavax completed Yes   Shingrix Completed?: Yes  Screening Tests Health Maintenance  Topic Date Due   Zoster Vaccines- Shingrix (1 of 2) Never done   DTaP/Tdap/Td (2 - Tdap) 02/27/2015   COVID-19 Vaccine (6 - 2023-24 season) 03/07/2022   Medicare Annual Wellness (AWV)  05/05/2023   Pneumonia Vaccine 18+ Years old  Completed   INFLUENZA VACCINE  Completed   HPV VACCINES  Aged Out    Health Maintenance  Health Maintenance Due  Topic Date Due   Zoster Vaccines- Shingrix (1 of 2) Never done   DTaP/Tdap/Td (2 - Tdap) 02/27/2015   COVID-19 Vaccine (  6 - 2023-24 season) 03/07/2022    Colorectal cancer screening: No longer required.   Lung Cancer Screening: (Low Dose CT Chest recommended if Age 71-80 years, 30 pack-year currently smoking OR have quit w/in 15years.) does not qualify.   Lung Cancer Screening Referral: no  Additional Screening:  Hepatitis C Screening: does not qualify; Completed no  Vision Screening: Recommended annual ophthalmology exams for early detection of glaucoma and other disorders of the eye. Is the patient up to date with their annual eye exam?  Yes  Who is the provider or what is the name of the office in which the patient attends annual eye exams? [place] If pt is not established with a provider, would they like to be referred to a provider to establish care? No .   Dental Screening: Recommended annual dental exams for  proper oral hygiene  Community Resource Referral / Chronic Care Management: CRR required this visit?  No   CCM required this visit?  No      Plan:     I have personally reviewed and noted the following in the patient's chart:   Medical and social history Use of alcohol, tobacco or illicit drugs  Current medications and supplements including opioid prescriptions. Patient is not currently taking opioid prescriptions. Functional ability and status Nutritional status Physical activity Advanced directives List of other physicians Hospitalizations, surgeries, and ER visits in previous 12 months Vitals Screenings to include cognitive, depression, and falls Referrals and appointments  In addition, I have reviewed and discussed with patient certain preventive protocols, quality metrics, and best practice recommendations. A written personalized care plan for preventive services as well as general preventive health recommendations were provided to patient.     Roger Shelter, LPN   06/09/7671   Nurse Notes: Pt states he is doing well and has no problems. He goes to mall and park to walk/talk with friends.Pt does inquire if he can take Viagra at his age. Encouraged pt to inquire at next visit but will be noted to MD.

## 2022-05-21 ENCOUNTER — Other Ambulatory Visit: Payer: Self-pay | Admitting: Nurse Practitioner

## 2022-05-21 DIAGNOSIS — J329 Chronic sinusitis, unspecified: Secondary | ICD-10-CM

## 2022-05-22 NOTE — Telephone Encounter (Signed)
Requested medication (s) are due for refill today: yes  Requested medication (s) are on the active medication list: yes   Last refill:  cardizem- 12/06/21 #90 1 refill, xyzal- 10/23/21 #90 1 refills  Future visit scheduled: no   Notes to clinic:  protocol failed. Last labs 04/18/21. Do you want to refill Rxs?     Requested Prescriptions  Pending Prescriptions Disp Refills   diltiazem (CARDIZEM CD) 120 MG 24 hr capsule [Pharmacy Med Name: DILTIAZEM 24H ER(CD) 120 MG CP] 90 capsule 1    Sig: TAKE 1 CAPSULE BY MOUTH EVERY DAY     Cardiovascular: Calcium Channel Blockers 3 Failed - 05/21/2022  1:35 PM      Failed - ALT in normal range and within 360 days    ALT  Date Value Ref Range Status  04/18/2021 6 (L) 9 - 46 U/L Final         Failed - AST in normal range and within 360 days    AST  Date Value Ref Range Status  04/18/2021 9 (L) 10 - 35 U/L Final         Failed - Cr in normal range and within 360 days    Creat  Date Value Ref Range Status  04/18/2021 1.15 0.70 - 1.22 mg/dL Final         Passed - Last BP in normal range    BP Readings from Last 1 Encounters:  11/28/21 126/70         Passed - Last Heart Rate in normal range    Pulse Readings from Last 1 Encounters:  11/28/21 76         Passed - Valid encounter within last 6 months    Recent Outpatient Visits           8 months ago Urinary frequency   [facility] Myles Gip, DO   9 months ago Acute cystitis without hematuria   [facility] Bo Merino, [name]   1 year ago Chills (without fever)   [facility] Bo Merino, [name]   1 year ago Essential hypertension   [facility] Bo Merino, [name]   1 year ago [facility] Serafina Royals F, [name]       Future Appointments             In 6 months [name], Hunt Oris, PA-C [facility]              levocetirizine (XYZAL) 5 MG tablet [Pharmacy Med Name: LEVOCETIRIZINE 5 MG TABLET] 90 tablet 1    Sig: TAKE 1 TABLET BY MOUTH EVERY DAY IN THE EVENING     Ear, Nose, and Throat:  Antihistamines - levocetirizine dihydrochloride Failed - 05/21/2022  1:35 PM      Failed - Cr in normal range and within 360 days    Creat  Date Value Ref Range Status  04/18/2021 1.15 0.70 - 1.22 mg/dL Final         Failed - eGFR is 10 or above and within 360 days    GFR, Est African American  Date Value Ref Range Status  11/12/2019 63 > OR = 60 mL/min/1.94m Final   GFR, Est Non African American  Date Value Ref Range Status  11/12/2019 54 (L) > OR = 60 mL/min/1.765mFinal   GFR, Estimated  Date Value Ref Range Status  02/08/2021 >60 >60 mL/min Final    Comment:    (NOTE) Calculated using the CKD-EPI Creatinine Equation (2021)    eGFR  Date Value Ref Range Status  04/18/2021 60 > OR = 60 mL/min/1.64m Final    Comment:    The eGFR is based on the CKD-EPI 2021 equation. To calculate  the new eGFR from a previous Creatinine or Cystatin C result, go to https://www.kidney.org/professionals/ kdoqi/gfr%5Fcalculator          Passed - Valid encounter within last 12 months    Recent Outpatient Visits           8 months ago Urinary frequency   [place]   9 months ago [facility] cystitis without hematuria   [name]   1 [facility]without fever[name]   1 year ago Essential hypertension   [name]   [name]       Future Appointments             In 6 months [name], SGordan PaymentCStevinson

## 2022-05-29 DIAGNOSIS — B356 Tinea cruris: Secondary | ICD-10-CM | POA: Diagnosis not present

## 2022-06-22 ENCOUNTER — Other Ambulatory Visit: Payer: Self-pay | Admitting: Family Medicine

## 2022-06-22 DIAGNOSIS — F419 Anxiety disorder, unspecified: Secondary | ICD-10-CM

## 2022-06-26 DIAGNOSIS — R42 Dizziness and giddiness: Secondary | ICD-10-CM | POA: Diagnosis not present

## 2022-06-27 ENCOUNTER — Encounter: Payer: Self-pay | Admitting: Physician Assistant

## 2022-06-27 ENCOUNTER — Other Ambulatory Visit: Payer: Self-pay

## 2022-06-27 ENCOUNTER — Ambulatory Visit (INDEPENDENT_AMBULATORY_CARE_PROVIDER_SITE_OTHER): Payer: PPO | Admitting: Physician Assistant

## 2022-06-27 VITALS — BP 130/72 | HR 90 | Temp 98.3°F | Resp 18 | Ht 73.0 in | Wt 224.9 lb

## 2022-06-27 DIAGNOSIS — N183 Chronic kidney disease, stage 3 unspecified: Secondary | ICD-10-CM | POA: Diagnosis not present

## 2022-06-27 DIAGNOSIS — E538 Deficiency of other specified B group vitamins: Secondary | ICD-10-CM | POA: Diagnosis not present

## 2022-06-27 DIAGNOSIS — R7303 Prediabetes: Secondary | ICD-10-CM

## 2022-06-27 DIAGNOSIS — E782 Mixed hyperlipidemia: Secondary | ICD-10-CM

## 2022-06-27 DIAGNOSIS — I10 Essential (primary) hypertension: Secondary | ICD-10-CM | POA: Diagnosis not present

## 2022-06-27 DIAGNOSIS — R42 Dizziness and giddiness: Secondary | ICD-10-CM | POA: Diagnosis not present

## 2022-06-27 NOTE — Assessment & Plan Note (Signed)
Unsure of chronicity or status  Most recent B12 level was measured in 2022 and was normal Recheck today- results to dictate further management

## 2022-06-27 NOTE — Assessment & Plan Note (Signed)
Chronic, historic condition Appears well managed on current regimen of Diltiazem 120 mg PO QD  Will recheck CBC, CMP, and lipid panel today  Continue current regimen Follow up in 4 -6 months for monitoring and medication management

## 2022-06-27 NOTE — Patient Instructions (Signed)
Please continue to stay active and take your medications as directed  We will keep you updated on the results of your labs once they come back  Please come back and see your PCP around June so she can discuss your medications and make sure you have plenty of refills   It was nice to meet you and I appreciate the opportunity to be involved in your care If you were satisfied with the care you received from me, I would greatly appreciate you saying so in the after-visit survey that is sent out following our visit.

## 2022-06-27 NOTE — Assessment & Plan Note (Signed)
Chronic, recurrent Patient reports resolution of symptoms at time of apt Will check CMP, CBC, B12 for rule out  He has Meclizine and reports this provided significant benefit Continue current regimen PRN Follow up as needed for persistent or progressing symptoms

## 2022-06-27 NOTE — Assessment & Plan Note (Signed)
Likely chronic, historic condition  Recheck A1c today  Results to dictate further management  Recommend yearly follow up for monitoring unless A1c is in diabetic range.

## 2022-06-27 NOTE — Assessment & Plan Note (Signed)
Unsure of chronicity Most recent labs since 01/2021 demonstrate GFR >60 so this concern may be resolved Recheck CMP today for confirmation

## 2022-06-27 NOTE — Progress Notes (Signed)
Established Patient Office Visit  Name: Jeffery Campbell   MRN: [id number]    DOB: 27-Nov-1928   Date:06/27/2022  Today's Provider: Talitha Givens, MHS, PA-C Introduced myself to the patient as a PA-C and provided education on APPs in clinical practice.         Subjective  Chief Complaint  Chief Complaint  Patient presents with   Dizziness   Hypertension   Hyperlipidemia    Follow up    HPI  HYPERTENSION / HYPERLIPIDEMIA Satisfied with current treatment? yes Duration of hypertension: years BP monitoring frequency: not checking BP range:  not checking at home regularly  BP medication side effects: no Past BP meds: diltiazem Duration of hyperlipidemia: chronic Cholesterol medication side effects: no Cholesterol supplements: fish oil Past cholesterol medications: atorvastain (lipitor) Medication compliance: good compliance Aspirin: yes Recent stressors: no Recurrent headaches: no Visual changes: no Palpitations: no Dyspnea: no Chest pain: no Lower extremity edema: no Dizzy/lightheaded: yes   Dizziness His son reports that every once in a while patient will wake up with dizziness  Meclizine was used to assist with current bout and patient reports significant improvement  Onset: sudden  Duration: resolved  Postural provocation: sometimes makes it worse  Interventions: Meclizine  BP changes: none to his knowledge  Nature of dizziness: feels like he is leaning or pitching to the side      Patient Active Problem List   Diagnosis Date Noted   Elevated hemoglobin (HCC) 01/31/2021   High serum vitamin B12 10/05/2020   Obstructive sleep apnea syndrome 11/12/2019   Anxiety with depression 07/13/2019   Benign localized prostatic hyperplasia with lower urinary tract symptoms (LUTS) 07/13/2019   Recurrent major depressive disorder, in partial remission ([place]) 10/08/2018   Erythrocytosis 01/31/2018   Hx of completed stroke 01/10/2018   Prediabetes 10/07/2017   Left  hemiparesis ([place]) 07/03/2017   Dizziness 07/03/2017   Abnormal gait 10/05/2016   Hypoxia, sleep related 04/20/2016   Elevated hematocrit 04/04/2016   Right flank pain 01/10/2016   B12 deficiency 05/24/2015   General unsteadiness 05/24/2015   Allergic rhinitis due to pollen 11/12/2014   Back pain, chronic 10/21/2014   Benign paroxysmal positional nystagmus 10/21/2014   Decreased libido 10/21/2014   Fatigue 10/21/2014   Gastro-esophageal reflux disease without esophagitis 123456   Dysmetabolic syndrome 123456   Fungal infection of nail 10/21/2014   HLD (hyperlipidemia) 08/19/2014   CKD (chronic kidney disease) stage 3, GFR 30-59 ml/min (HCC) 08/19/2014   Class 1 obesity due to excess calories with serious comorbidity and body mass index (BMI) of 30.0 to 30.9 in adult    Asymptomatic PVCs 05/27/2014   Bradycardia 04/05/2014   Essential hypertension 04/05/2014   Osteoarthrosis involving more than one site but not generalized 08/03/2008    Past Surgical History:  Procedure Laterality Date   CATARACT EXTRACTION W/PHACO Left 02/14/2016   Procedure: CATARACT EXTRACTION PHACO AND INTRAOCULAR LENS PLACEMENT (IOC);  Surgeon: Birder Robson, MD;  Location: [facility] ORS;  Service: Ophthalmology;  Laterality: Left;  [place] 53.7 AP% 21.5 CDE 11.58 FLUID PACK LOT # [id number]   CATARACT EXTRACTION W/PHACO Right 12/18/2018   Procedure: CATARACT EXTRACTION PHACO AND INTRAOCULAR LENS PLACEMENT ([place])  RIGHT, VISION BLUE;  Surgeon: Marchia Meiers, MD;  Location: [facility] ORS;  Service: Ophthalmology;  Laterality: Right;  [place]  01:14 CDE 11.82 Fluid pack lot # [id number]   COLONOSCOPY     KNEE ARTHROSCOPY     TRANSURETHRAL RESECTION OF PROSTATE  Family History  Problem Relation Age of Onset   Hypertension Son        Marena Chancy of parents health.   Prostate cancer Neg Hx    Bladder Cancer Neg Hx    Kidney cancer Neg Hx     Social History   Tobacco Use   Smoking status: Never   Smokeless tobacco:  Never   Tobacco comments:    smoking cessation materials not required  Substance Use Topics   Alcohol use: No    Alcohol/week: 0.0 standard drinks of alcohol     Current Outpatient Medications:    acetaminophen (TYLENOL) 325 MG tablet, Take 325-650 mg by mouth every 6 (six) hours as needed for moderate pain or headache., Disp: , Rfl:    alfuzosin (UROXATRAL) 10 MG 24 hr tablet, Take 1 tablet (10 mg total) by mouth daily with breakfast., Disp: 90 tablet, Rfl: 3   aspirin 81 MG EC tablet, Take 1 tablet (81 mg total) by mouth daily. Swallow whole., Disp: 30 tablet, Rfl: 11   atorvastatin (LIPITOR) 20 MG tablet, TAKE 1 TABLET BY MOUTH EVERYDAY AT BEDTIME, Disp: 90 tablet, Rfl: 3   busPIRone (BUSPAR) 10 MG tablet, TAKE 1 TABLET (10 MG TOTAL) BY MOUTH 2 (TWO) TIMES DAILY AS NEEDED (FOR ANXIETY)., Disp: 180 tablet, Rfl: 0   diltiazem (CARDIZEM CD) 120 MG 24 hr capsule, TAKE 1 CAPSULE BY MOUTH EVERY DAY, Disp: 90 capsule, Rfl: 1   finasteride (PROSCAR) 5 MG tablet, Take 1 tablet (5 mg total) by mouth daily., Disp: 90 tablet, Rfl: 3   levocetirizine (XYZAL) 5 MG tablet, TAKE 1 TABLET BY MOUTH EVERY DAY IN THE EVENING, Disp: 90 tablet, Rfl: 1   meclizine (ANTIVERT) 12.5 MG tablet, Take 1 tablet (12.5 mg total) by mouth daily as needed for dizziness (vertigo episodes). Use sparingly.  Must get appointment if using daily or symptoms worsen, Disp: 30 tablet, Rfl: 0   Omega-3 Fatty Acids (FISH OIL) 1200 MG CAPS, Take 2 capsules (2,400 mg total) by mouth in the morning and at bedtime., Disp: , Rfl:    Propylene Glycol (SYSTANE BALANCE) 0.6 % SOLN, Place 1 drop into both eyes 3 (three) times daily as needed (dry eyes)., Disp: , Rfl:    fluticasone (FLONASE) 50 MCG/ACT nasal spray, PLACE 2 SPRAYS INTO BOTH NOSTRILS DAILY AS NEEDED FOR ALLERGIES OR RHINITIS. (Patient not taking: Reported on 11/28/2021), Disp: 48 mL, Rfl: 2  No Known Allergies  I personally reviewed active problem list, medication list,  allergies, health maintenance, notes from last encounter, lab results with the patient/caregiver today.   Review of Systems  Constitutional:  Negative for chills and fever.  HENT:  Negative for hearing loss and tinnitus.   Eyes:  Negative for blurred vision and double vision.  Respiratory:  Negative for cough, shortness of breath and wheezing.   Cardiovascular:  Negative for chest pain, palpitations and leg swelling.  Musculoskeletal:  Negative for falls.  Neurological:  Negative for dizziness, loss of consciousness, weakness and headaches.      Objective  Vitals:   06/27/22 1000  BP: 130/72  Pulse: 90  Resp: 18  Temp: 98.3 F (36.8 C)  TempSrc: Oral  SpO2: 95%  Weight: 224 lb 14.4 oz (102 kg)  Height: 6\' 1"  (1.854 m)    Body mass index is 29.67 kg/m.  Physical Exam Vitals reviewed.  Constitutional:      General: He is awake.     Appearance: Normal appearance. He is well-developed and  well-groomed.  HENT:     Head: Normocephalic and atraumatic.     Mouth/Throat:     Mouth: Mucous membranes are moist.     Pharynx: Oropharynx is clear. No posterior oropharyngeal erythema.  Eyes:     General: Lids are normal. Gaze aligned appropriately.     Extraocular Movements: Extraocular movements intact.     Conjunctiva/sclera: Conjunctivae normal.     Pupils: Pupils are equal, round, and reactive to light.  Cardiovascular:     Rate and Rhythm: Normal rate and regular rhythm.     Pulses: Normal pulses.     Heart sounds: Normal heart sounds. No murmur heard.    No friction rub. No gallop.  Pulmonary:     Effort: Pulmonary effort is normal.     Breath sounds: Normal breath sounds. No decreased air movement. No decreased breath sounds, wheezing, rhonchi or rales.  Musculoskeletal:     Right lower leg: No edema.     Left lower leg: No edema.  Skin:    General: Skin is warm and dry.  Neurological:     General: No focal deficit present.     Mental Status: He is alert and  oriented to person, place, and time. Mental status is at baseline.  Psychiatric:        Mood and Affect: Mood normal.        Behavior: Behavior normal. Behavior is cooperative.        Thought Content: Thought content normal.        Judgment: Judgment normal.      No results found for this or any previous visit (from the past 2160 hour(s)).   PHQ2/9:    06/27/2022   10:01 AM 05/04/2022    9:06 AM 09/20/2021    1:37 PM 08/18/2021    1:24 PM 05/16/2021    2:48 PM  Depression screen PHQ 2/9  Decreased Interest 0 0 0 0 0  Down, Depressed, Hopeless 0 0 0 0 0  PHQ - 2 Score 0 0 0 0 0  Altered sleeping 0   0 0  Tired, decreased energy 0   0 0  Change in appetite 0   0 0  Feeling bad or failure about yourself  0   0 0  Trouble concentrating 0   0 0  Moving slowly or fidgety/restless 0   0 0  Suicidal thoughts 0   0 0  PHQ-9 Score 0   0 0  Difficult doing work/chores Not difficult at all   Not difficult at all Not difficult at all      Fall Risk:    06/27/2022   10:01 AM 05/04/2022    9:04 AM 09/20/2021    1:37 PM 08/18/2021    1:24 PM 05/16/2021    2:48 PM  Fall Risk   Falls in the past year? 0 0 0 0 0  Number falls in past yr: 0 0 0 0 0  Injury with Fall? 0 0 0 0 0  Risk for fall due to :  No Fall Risks No Fall Risks  No Fall Risks  Follow up  Education provided;Falls prevention discussed Falls prevention discussed  Falls prevention discussed      Functional Status Survey: Is the patient deaf or have difficulty hearing?: No Does the patient have difficulty seeing, even when wearing glasses/contacts?: No Does the patient have difficulty concentrating, remembering, or making decisions?: No Does the patient have difficulty walking or climbing stairs?: Yes Does the  patient have difficulty dressing or bathing?: No Does the patient have difficulty doing errands alone such as visiting a doctor's office or shopping?: Yes    Assessment & Plan  Problem List Items Addressed This  Visit       Cardiovascular and Mediastinum   Essential hypertension - Primary (Chronic)    Chronic, historic condition Appears well managed on current regimen of Diltiazem 120 mg PO QD  Will recheck CBC, CMP, and lipid panel today  Continue current regimen Follow up in 4 -6 months for monitoring and medication management       Relevant Orders   COMPLETE METABOLIC PANEL WITH GFR   CBC w/Diff/Platelet     Genitourinary   CKD (chronic kidney disease) stage 3, GFR 30-59 ml/min (HCC)    Unsure of chronicity Most recent labs since 01/2021 demonstrate GFR >60 so this concern may be resolved Recheck CMP today for confirmation          Other   HLD (hyperlipidemia) (Chronic)    Chronic, historic condition Currently managed with Atorvastatin 20 mg PO QD and fish oil supplement Continue current regimen Recheck lipid panel today- patient is not fasting today so will take this into consideration if triglycerides are elevated Follow up in 6 months for monitoring       Relevant Orders   Lipid Profile   B12 deficiency    Unsure of chronicity or status  Most recent B12 level was measured in 2022 and was normal Recheck today- results to dictate further management       Relevant Orders   B12   Prediabetes    Likely chronic, historic condition  Recheck A1c today  Results to dictate further management  Recommend yearly follow up for monitoring unless A1c is in diabetic range.       Relevant Orders   HgB A1c   Dizziness    Chronic, recurrent Patient reports resolution of symptoms at time of apt Will check CMP, CBC, B12 for rule out  He has Meclizine and reports this provided significant benefit Continue current regimen PRN Follow up as needed for persistent or progressing symptoms         Return in about 3 months (around 09/27/2022) for HTN, HLD, Depression,  medication refills .   I, Dian Laprade E Yukari Flax, PA-C, have reviewed all documentation for this visit. The documentation  on 06/27/22 for the exam, diagnosis, procedures, and orders are all accurate and complete.   Talitha Givens, MHS, PA-C [facility]

## 2022-06-27 NOTE — Assessment & Plan Note (Signed)
Chronic, historic condition Currently managed with Atorvastatin 20 mg PO QD and fish oil supplement Continue current regimen Recheck lipid panel today- patient is not fasting today so will take this into consideration if triglycerides are elevated Follow up in 6 months for monitoring

## 2022-06-28 LAB — COMPLETE METABOLIC PANEL WITH GFR
AG Ratio: 1.6 (calc) (ref 1.0–2.5)
ALT: 10 U/L (ref 9–46)
AST: 14 U/L (ref 10–35)
Albumin: 3.9 g/dL (ref 3.6–5.1)
Alkaline phosphatase (APISO): 42 U/L (ref 35–144)
BUN: 12 mg/dL (ref 7–25)
CO2: 29 mmol/L (ref 20–32)
Calcium: 9.2 mg/dL (ref 8.6–10.3)
Chloride: 105 mmol/L (ref 98–110)
Creat: 1.12 mg/dL (ref 0.70–1.22)
Globulin: 2.5 g/dL (calc) (ref 1.9–3.7)
Glucose, Bld: 98 mg/dL (ref 65–99)
Potassium: 3.8 mmol/L (ref 3.5–5.3)
Sodium: 140 mmol/L (ref 135–146)
Total Bilirubin: 1.8 mg/dL — ABNORMAL HIGH (ref 0.2–1.2)
Total Protein: 6.4 g/dL (ref 6.1–8.1)
eGFR: 62 mL/min/{1.73_m2} (ref >=60)

## 2022-06-28 LAB — CBC WITH DIFFERENTIAL/PLATELET
Absolute Monocytes: 564 cells/uL (ref 200–950)
Basophils Absolute: 31 cells/uL (ref 0–200)
Basophils Relative: 0.5 %
Eosinophils Absolute: 68 cells/uL (ref 15–500)
Eosinophils Relative: 1.1 %
HCT: 48.7 % (ref 38.5–50.0)
Hemoglobin: 16.8 g/dL (ref 13.2–17.1)
Lymphs Abs: 1066 cells/uL (ref 850–3900)
MCH: 31.4 pg (ref 27.0–33.0)
MCHC: 34.5 g/dL (ref 32.0–36.0)
MCV: 91 fL (ref 80.0–100.0)
MPV: 11.2 fL (ref 7.5–12.5)
Monocytes Relative: 9.1 %
Neutro Abs: 4470 cells/uL (ref 1500–7800)
Neutrophils Relative %: 72.1 %
Platelets: 189 10*3/uL (ref 140–400)
RBC: 5.35 10*6/uL (ref 4.20–5.80)
RDW: 12.2 % (ref 11.0–15.0)
Total Lymphocyte: 17.2 %
WBC: 6.2 10*3/uL (ref 3.8–10.8)

## 2022-06-28 LAB — HEMOGLOBIN A1C
Hgb A1c MFr Bld: 5.2 % of total Hgb (ref <5.7)
Mean Plasma Glucose: 103 mg/dL
eAG (mmol/L): 5.7 mmol/L

## 2022-06-28 LAB — LIPID PANEL
Cholesterol: 123 mg/dL (ref <200)
HDL: 50 mg/dL (ref >=40)
LDL Cholesterol (Calc): 60 mg/dL (calc)
Non-HDL Cholesterol (Calc): 73 mg/dL (calc) (ref <130)
Total CHOL/HDL Ratio: 2.5 (calc) (ref <5.0)
Triglycerides: 58 mg/dL (ref <150)

## 2022-06-28 LAB — VITAMIN B12: Vitamin B-12: 204 pg/mL (ref 200–1100)

## 2022-07-03 NOTE — Progress Notes (Signed)
Labs are normal/stable.

## 2022-07-06 ENCOUNTER — Other Ambulatory Visit: Payer: Self-pay | Admitting: Nurse Practitioner

## 2022-07-06 DIAGNOSIS — F419 Anxiety disorder, unspecified: Secondary | ICD-10-CM

## 2022-07-06 NOTE — Telephone Encounter (Signed)
Requested Prescriptions  Refused Prescriptions Disp Refills   busPIRone (BUSPAR) 10 MG tablet [Pharmacy Med Name: BUSPIRONE HCL 10 MG TABLET] 180 tablet 0    Sig: TAKE 1 TABLET (10 MG TOTAL) BY MOUTH 2 (TWO) TIMES DAILY AS NEEDED (FOR ANXIETY).     Psychiatry: Anxiolytics/Hypnotics - Non-controlled Passed - 07/06/2022 11:32 AM      Passed - Valid encounter within last 12 months    Recent Outpatient Visits           1 week ago Essential hypertension   [facility] Mecum, Oswaldo Conroy, PA-C   9 months ago Urinary frequency   [facility] [place] D[place] M, DO   10 months ago Acute cystitis without hematuria   [facility] Berniece Salines, [name]   1 year ago Chills (without fever)   [facility] Berniece Salines, [name]   1 year ago Essential hypertension   [facility] Berniece Salines, [name]       Future Appointments             In 2 months Zane Herald, Rudolpho Sevin, [name] [facility], [facility]   In 4 months [name], Elana Alm [facility] [place]

## 2022-07-06 NOTE — Telephone Encounter (Signed)
Requested medications are due for refill today.  no  Requested medications are on the active medications list.  yes  Last refill. 06/22/2022 #180 0 rf  Future visit scheduled.   yes  Notes to clinic.  Note from pharmacy:   To pharmacy: DX Code Needed  PAITENT WANTS A REFILL.    Last ordered: 2 weeks ago (06/22/2022) by Berniece Salines, [name]    Please advise.  Requested Prescriptions  Pending Prescriptions Disp Refills   busPIRone (BUSPAR) 10 MG tablet [Pharmacy Med Name: BUSPIRONE HCL 10 MG TABLET] 180 tablet 0    Sig: TAKE 1 TABLET (10 MG TOTAL) BY MOUTH 2 (TWO) TIMES DAILY AS NEEDED (FOR ANXIETY).     Psychiatry: Anxiolytics/Hypnotics - Non-controlled Passed - 07/06/2022  4:26 PM      Passed - Valid encounter within last 12 months    Recent Outpatient Visits           1 week ago Essential hypertension   [facility] Mecum, Oswaldo Conroy, PA-C   9 months ago Urinary frequency   [facility] [place] Dense M, DO   10 months ago Acute cystitis without hematuria   [facility] Berniece Salines, [name]   1 year ago Chills (without fever)   [facility] Berniece Salines, [name]   1 year ago Essential hypertension   [facility] Berniece Salines, [name]       Future Appointments             In 2 months Zane Herald, Rudolpho Sevin, [name] [facility], [facility]   In 4 months [name], Elana Alm [facility] [place]

## 2022-07-13 ENCOUNTER — Other Ambulatory Visit: Payer: Self-pay | Admitting: Nurse Practitioner

## 2022-07-13 DIAGNOSIS — E782 Mixed hyperlipidemia: Secondary | ICD-10-CM

## 2022-07-16 NOTE — Telephone Encounter (Signed)
Requested Prescriptions  Pending Prescriptions Disp Refills   atorvastatin (LIPITOR) 20 MG tablet [Pharmacy Med Name: ATORVASTATIN 20 MG TABLET] 90 tablet 1    Sig: TAKE 1 TABLET BY MOUTH EVERYDAY AT BEDTIME     Cardiovascular:  Antilipid - Statins Failed - 07/13/2022  6:33 PM      Failed - Lipid Panel in normal range within the last 12 months    Cholesterol, Total  Date Value Ref Range Status  09/22/2014 134 100 - 199 mg/dL Final   Cholesterol  Date Value Ref Range Status  06/27/2022 123 <200 mg/dL Final   LDL Cholesterol (Calc)  Date Value Ref Range Status  06/27/2022 60 mg/dL (calc) Final    Comment:    Reference range: <100 . Desirable range <100 mg/dL for primary prevention;   <70 mg/dL for patients with CHD or diabetic patients  with > or = 2 CHD risk factors. [name] LDL-C is now calculated using the Blue-Hopkins  calculation, which is a validated novel method providing  better accuracy than the Friedewald equation in the  estimation of LDL-C.  Horald Pollen et al. Lenox Ahr. 7943;276(14): 2061-2068  (http://education.[employer].com/faq/FAQ164)    HDL  Date Value Ref Range Status  06/27/2022 50 > OR = 40 mg/dL Final  70/92/9574 46 >73 mg/dL Final    Comment:    According to ATP-III Guidelines, HDL-C >59 mg/dL is considered a negative risk factor for CHD.    Triglycerides  Date Value Ref Range Status  06/27/2022 58 <150 mg/dL Final         Passed - Patient is not pregnant      Passed - Valid encounter within last 12 months    Recent Outpatient Visits           2 weeks ago Essential hypertension   [facility] Mecum, Oswaldo Conroy, PA-C   9 months ago Urinary frequency   [facility] [place] Dense M, DO   11 months ago Acute cystitis without hematuria   [facility] Berniece Salines, [name]   1 year ago Chills (without fever)   [facility] Berniece Salines, [name]    1 year ago Essential hypertension   [facility] Berniece Salines, [name]       Future Appointments             In 2 months Zane Herald, Rudolpho Sevin, [name] [facility], [facility]   In 4 months [name], Elana Alm [facility]

## 2022-07-24 ENCOUNTER — Ambulatory Visit: Payer: Self-pay | Admitting: *Deleted

## 2022-07-24 ENCOUNTER — Telehealth: Payer: Self-pay | Admitting: Nurse Practitioner

## 2022-07-24 NOTE — Telephone Encounter (Signed)
Per agent: "Pt called in states is getting tooth puled and they are wanting to stop the blood thinner medi. He is trying to find out what the name of the med  is."    Pt called back to ensure dental office called for clarification of any blood thinners. I do see ASA , from 2022. Pt states is not taking. Pt wants to be sure they are alerted so they can go on with procedure.

## 2022-07-24 NOTE — Telephone Encounter (Signed)
Pt called in states is getting tooth puled and they are wanting to stop the blood thinner medi. He is trying to find out what the name of the med  is.   Addressed in earlier telephone encounter.

## 2022-07-24 NOTE — Telephone Encounter (Signed)
I see no blood thinner.

## 2022-07-24 NOTE — Telephone Encounter (Signed)
Alyssa with [facility] called to see if patient is on a blood thinner and if so does he need to hold off on taking it prior to a dental procedure. It has not been scheduled as of yet. Please call and ask to speak with Alyssa

## 2022-07-24 NOTE — Telephone Encounter (Signed)
Alyssa from [facility] wanting letter faxed or email about pt if he has any contra indications fro dental exractions. Fx [phone]. Email [email or web address]

## 2022-07-25 ENCOUNTER — Encounter: Payer: Self-pay | Admitting: Nurse Practitioner

## 2022-07-25 NOTE — Telephone Encounter (Signed)
Letter faxed to Jeffery Campbell at dental office

## 2022-07-25 NOTE — Telephone Encounter (Signed)
[facility] called and is asking for a call back ASAP on pt.     Copied from CRM [id number]. Topic: General - Other >> Jul 25, 2022  9:56 AM Macon Large wrote: Reason for CRM: Allyssa with [facility] called about the clearance for tooth extraction. Cb# [phone]

## 2022-07-25 NOTE — Telephone Encounter (Signed)
FYI

## 2022-09-06 ENCOUNTER — Telehealth: Payer: Self-pay | Admitting: Nurse Practitioner

## 2022-09-06 NOTE — Telephone Encounter (Signed)
FYI referral to ENT

## 2022-09-06 NOTE — Telephone Encounter (Signed)
Copied from CRM [id number]. Topic: Referral - Request for Referral >> Sep 06, 2022 11:16 AM Patsy Lager T wrote: Has patient seen PCP for this complaint? Yes.   *If NO, is insurance requiring patient see PCP for this issue before PCP can refer them? Referral for which specialty: ENT Preferred provider/office: unknown Reason for referral: clogging in both ears

## 2022-09-07 ENCOUNTER — Other Ambulatory Visit: Payer: Self-pay | Admitting: Nurse Practitioner

## 2022-09-07 DIAGNOSIS — H6123 Impacted cerumen, bilateral: Secondary | ICD-10-CM

## 2022-09-14 ENCOUNTER — Ambulatory Visit: Payer: Self-pay

## 2022-09-14 NOTE — Telephone Encounter (Signed)
  Chief Complaint: hearing loss Symptoms: hearing loss in bilat ears Frequency: several weeks Pertinent Negatives: NA Disposition: [] ED /[] Urgent Care (no appt availability in office) / [] Appointment(In office/virtual)/ []  [place] [facility]/ [] Home Care/ [] Refused Recommended Disposition /[] [place] [facility]/ []  Follow-up with PCP Additional Notes: advised pt that referral has been placed for [facility] and they should be contacting pt for appt. Pt verbalized understanding and no further assistance needed.   Summary: Hearing concerns   Pt is calling in requesting to speak with a nurse regarding concerns about his hearing and/or hearing loss.         Reason for Disposition  Decreased hearing (gradual) associated with aging  Answer Assessment - Initial Assessment Questions 1. DESCRIPTION: "What type of hearing problem are you having? Describe it for me." (e.g., complete hearing loss, partial loss)     Hearing loss 2. LOCATION: "One or both ears?" If one, ask: "Which ear?"     Both ears 3. SEVERITY: "Can you hear anything?" If Yes, ask: "What can you hear?" (e.g., ticking watch, whisper, talking)   - MILD:  Difficulty hearing soft speech, quiet library sounds, or speech from a distance or over background noise.   - MODERATE: Difficulty hearing normal speech even at closed distances.   - SEVERE: Unable to hear most normal conversation and talking; only able to hear loud sounds such as an alarm clock.     Can hear, sounds come and go at times  4. ONSET: "When did this begin?" "Did it start suddenly or come on gradually?"     Several weeks  5. PATTERN: "Does this come and go, or has it been constant since it started?"     Comes and go  Protocols used: Hearing Loss or Change-A-AH

## 2022-09-26 NOTE — Progress Notes (Unsigned)
There were no vitals taken for this visit.   Subjective:    Patient ID: Jeffery Campbell, male    DOB: 1928/08/27, 89 y.o.   MRN: [id number]  HPI: Jeffery Campbell is a 89 y.o. male  No chief complaint on file.  Hypertension:  -Medications: diltiazem 120 mg daily -Patient is compliant with above medications and reports no side effects. -Checking BP at home (average): *** -Highest BP at home: *** -Lowest BP at home: *** -Denies any SOB, CP, vision changes, LE edema or symptoms of hypotension -Diet: *** -Exercise: ***    HLD:  -Medications: atorvastatin 20 mg daily -Patient is compliant with above medications and reports no side effects. *** -Last lipid panel:   Lipid Panel     Component Value Date/Time   CHOL 123 06/27/2022 1031   CHOL 134 09/22/2014 0810   TRIG 58 06/27/2022 1031   HDL 50 06/27/2022 1031   HDL 46 09/22/2014 0810   CHOLHDL 2.5 06/27/2022 1031   VLDL 9 10/12/2016 0813   LDLCALC 60 06/27/2022 1031   LABVLDL 15 09/22/2014 0810    GERD GERD control status: {Blank single:19197::"controlled","uncontrolled","better","worse","exacerbated","stable"}Satisfied with current treatment? {Blank single:19197::"yes","no"} Heartburn frequency:  Medication side effects: {Blank single:19197::"yes","no"}  Medication compliance: {Blank multiple:19196::"better","worse","stable","fluctuating"} Previous GERD medications: Antacid use frequency:   Duration:  Nature:  Location:  Heartburn duration:  Alleviatiating factors:   Aggravating factors:  Dysphagia: {Blank single:19197::"yes","no"} Odynophagia:  {Blank single:19197::"yes","no"} Hematemesis: {Blank single:19197::"yes","no"} Blood in stool: {Blank single:19197::"yes","no"} EGD: {Blank single:19197::"yes","no"}   Obesity:  Current weight : *** BMI: *** Highest weight:*** Treatment Tried: *** Comorbidities: HTN, GERD, CKD, depression, anxiety, HLD  CKD:  -CKD status: {Blank  single:19197::"controlled","uncontrolled","better","worse","exacerbated","stable"} -Last Creatinine:  -Medications renally dose: {Blank single:19197::"yes","no"} -Previous renal evaluation: {Blank single:19197::"yes","no"} -Pneumovax:  {Blank single:19197::"Up to Date","Not up to Date","unknown"} -Influenza Vaccine:  {Blank single:19197::"Up to Date","Not up to Date","unknown"}   Depression/anxiety Medication buspar 10 mg BID Compliant *** Side effects *** PHQ9 *** GAD *** Therapy ***      06/27/2022   10:01 AM 05/04/2022    9:06 AM 09/20/2021    1:37 PM 08/18/2021    1:24 PM 05/16/2021    2:48 PM  Depression screen PHQ 2/9  Decreased Interest 0 0 0 0 0  Down, Depressed, Hopeless 0 0 0 0 0  PHQ - 2 Score 0 0 0 0 0  Altered sleeping 0   0 0  Tired, decreased energy 0   0 0  Change in appetite 0   0 0  Feeling bad or failure about yourself  0   0 0  Trouble concentrating 0   0 0  Moving slowly or fidgety/restless 0   0 0  Suicidal thoughts 0   0 0  PHQ-9 Score 0   0 0  Difficult doing work/chores Not difficult at all   Not difficult at all Not difficult at all       06/27/2022   10:01 AM 05/03/2021   10:22 AM 04/18/2021   12:40 PM 01/31/2021   10:43 AM  GAD 7 : Generalized Anxiety Score  Nervous, Anxious, on Edge 0 0 1 0  Control/stop worrying 0 0 0 0  Worry too much - different things 0 0 0 0  Trouble relaxing 0 0 0 0  Restless 0 0 0 0  Easily annoyed or irritable 0 0 0 0  Afraid - awful might happen 0 0 0 0  Total GAD 7 Score 0 0 1 0  Anxiety Difficulty Not difficult  at all Not difficult at all Not difficult at all Not difficult at all     Relevant past medical, surgical, family and social history reviewed and updated as indicated. Interim medical history since our last visit reviewed. Allergies and medications reviewed and updated.  Review of Systems  Constitutional: Negative for fever or weight change.  Respiratory: Negative for cough and shortness of breath.    Cardiovascular: Negative for chest pain or palpitations.  Gastrointestinal: Negative for abdominal pain, no bowel changes.  Musculoskeletal: Positive for gait problem (uses cane) negative for  joint swelling.  Skin: Negative for rash.  Neurological: Negative for dizziness or headache.  No other specific complaints in a complete review of systems (except as listed in HPI above).      Objective:    There were no vitals taken for this visit.  Wt Readings from Last 3 Encounters:  06/27/22 224 lb 14.4 oz (102 kg)  05/04/22 225 lb (102.1 kg)  11/28/21 225 lb (102.1 kg)    Physical Exam  Constitutional: Patient appears well-developed and well-nourished. Obese  No distress.  HEENT: head atraumatic, normocephalic, pupils equal and reactive to light,  neck supple Cardiovascular: Normal rate, regular rhythm and normal heart sounds.  No murmur heard. No BLE edema. Pulmonary/Chest: Effort normal and breath sounds normal. No respiratory distress. Abdominal: Soft.  There is no tenderness. Psychiatric: Patient has a normal mood and affect. behavior is normal. Judgment and thought content normal.   Results for orders placed or performed in visit on 06/27/22  COMPLETE METABOLIC PANEL WITH GFR  Result Value Ref Range   Glucose, Bld 98 65 - 99 mg/dL   BUN 12 7 - 25 mg/dL   Creat 4.09 8.11 - 9.14 mg/dL   eGFR 62 > OR = 60 NW/GNF/6.21H0   BUN/Creatinine Ratio SEE NOTE: 6 - 22 (calc)   Sodium 140 135 - 146 mmol/L   Potassium 3.8 3.5 - 5.3 mmol/L   Chloride 105 98 - 110 mmol/L   CO2 29 20 - 32 mmol/L   Calcium 9.2 8.6 - 10.3 mg/dL   Total Protein 6.4 6.1 - 8.1 g/dL   Albumin 3.9 3.6 - 5.1 g/dL   Globulin 2.5 1.9 - 3.7 g/dL (calc)   AG Ratio 1.6 1.0 - 2.5 (calc)   Total Bilirubin 1.8 (H) 0.2 - 1.2 mg/dL   Alkaline phosphatase (APISO) 42 35 - 144 U/L   AST 14 10 - 35 U/L   ALT 10 9 - 46 U/L  CBC w/Diff/Platelet  Result Value Ref Range   WBC 6.2 3.8 - 10.8 Thousand/uL   RBC 5.35 4.20 - 5.80  Million/uL   Hemoglobin 16.8 13.2 - 17.1 g/dL   HCT 86.5 78.4 - 69.6 %   MCV 91.0 80.0 - 100.0 fL   MCH 31.4 27.0 - 33.0 pg   MCHC 34.5 32.0 - 36.0 g/dL   RDW 29.5 28.4 - 13.2 %   Platelets 189 140 - 400 Thousand/uL   MPV 11.2 7.5 - 12.5 fL   Neutro Abs 4,470 1,500 - 7,800 cells/uL   Lymphs Abs 1,066 850 - 3,900 cells/uL   Absolute Monocytes 564 200 - 950 cells/uL   Eosinophils Absolute 68 15 - 500 cells/uL   Basophils Absolute 31 0 - 200 cells/uL   Neutrophils Relative % 72.1 %   Total Lymphocyte 17.2 %   Monocytes Relative 9.1 %   Eosinophils Relative 1.1 %   Basophils Relative 0.5 %  Lipid Profile  Result Value Ref  Range   Cholesterol 123 <200 mg/dL   HDL 50 > OR = 40 mg/dL   Triglycerides 58 <086 mg/dL   LDL Cholesterol (Calc) 60 mg/dL (calc)   Total CHOL/HDL Ratio 2.5 <5.0 (calc)   Non-HDL Cholesterol (Calc) 73 <578 mg/dL (calc)  I69  Result Value Ref Range   Vitamin B-12 204 200 - 1,100 pg/mL  HgB A1c  Result Value Ref Range   Hgb A1c MFr Bld 5.2 <5.7 % of total Hgb   Mean Plasma Glucose 103 mg/dL   eAG (mmol/L) 5.7 mmol/L      Assessment & Plan:   1. Essential hypertension -continue current treatment plan  2. Mixed hyperlipidemia -continue current treatment plan  3. Gastro-esophageal reflux disease without esophagitis -continue to monitor  4. Class 1 obesity due to excess calories with serious comorbidity and body mass index (BMI) of 30.0 to 30.9 in adult - continue to monitor  5. Stage 2 chronic kidney disease -continue to monitor  6. Anxiety -continue current treatment plan  7. Recurrent major depressive disorder, in partial remission (HCC) -continue current treatment  8. Benign paroxysmal positional vertigo, unspecified laterality -continue current treatment  9. Chills - no concerns at this time  Follow up plan: No follow-ups on file.

## 2022-09-27 ENCOUNTER — Other Ambulatory Visit: Payer: Self-pay

## 2022-09-27 ENCOUNTER — Encounter: Payer: Self-pay | Admitting: Nurse Practitioner

## 2022-09-27 ENCOUNTER — Ambulatory Visit (INDEPENDENT_AMBULATORY_CARE_PROVIDER_SITE_OTHER): Payer: PPO | Admitting: Nurse Practitioner

## 2022-09-27 VITALS — BP 122/70 | HR 75 | Temp 97.9°F | Resp 16 | Ht 73.0 in | Wt 219.4 lb

## 2022-09-27 DIAGNOSIS — E782 Mixed hyperlipidemia: Secondary | ICD-10-CM

## 2022-09-27 DIAGNOSIS — G8194 Hemiplegia, unspecified affecting left nondominant side: Secondary | ICD-10-CM | POA: Diagnosis not present

## 2022-09-27 DIAGNOSIS — I10 Essential (primary) hypertension: Secondary | ICD-10-CM | POA: Diagnosis not present

## 2022-09-27 DIAGNOSIS — R7303 Prediabetes: Secondary | ICD-10-CM

## 2022-09-27 DIAGNOSIS — K219 Gastro-esophageal reflux disease without esophagitis: Secondary | ICD-10-CM | POA: Diagnosis not present

## 2022-09-27 DIAGNOSIS — N182 Chronic kidney disease, stage 2 (mild): Secondary | ICD-10-CM | POA: Insufficient documentation

## 2022-09-27 DIAGNOSIS — F3341 Major depressive disorder, recurrent, in partial remission: Secondary | ICD-10-CM | POA: Diagnosis not present

## 2022-09-27 DIAGNOSIS — E6609 Other obesity due to excess calories: Secondary | ICD-10-CM

## 2022-09-27 DIAGNOSIS — E663 Overweight: Secondary | ICD-10-CM

## 2022-09-27 NOTE — Assessment & Plan Note (Signed)
stable °

## 2022-09-27 NOTE — Assessment & Plan Note (Signed)
Continue buspar 10 mg BID, condition stable

## 2022-09-27 NOTE — Assessment & Plan Note (Signed)
Continue atorvastatin 20mg daily

## 2022-09-27 NOTE — Assessment & Plan Note (Signed)
Improved, stable 

## 2022-09-27 NOTE — Assessment & Plan Note (Signed)
No change , doing well 

## 2022-09-27 NOTE — Assessment & Plan Note (Signed)
Continue to work on life style modification 

## 2022-09-27 NOTE — Assessment & Plan Note (Signed)
Continue diltiazem 120mg daily

## 2022-10-08 ENCOUNTER — Other Ambulatory Visit: Payer: Self-pay | Admitting: Nurse Practitioner

## 2022-10-08 DIAGNOSIS — F419 Anxiety disorder, unspecified: Secondary | ICD-10-CM

## 2022-10-08 NOTE — Telephone Encounter (Signed)
Requested Prescriptions  Pending Prescriptions Disp Refills   diltiazem (CARDIZEM CD) 120 MG 24 hr capsule [Pharmacy Med Name: DILTIAZEM 24H ER(CD) 120 MG CP] 90 capsule 1    Sig: TAKE 1 CAPSULE BY MOUTH EVERY DAY     Cardiovascular: Calcium Channel Blockers 3 Passed - 10/08/2022  9:19 AM      Passed - ALT in normal range and within 360 days    ALT  Date Value Ref Range Status  06/27/2022 10 9 - 46 U/L Final         Passed - AST in normal range and within 360 days    AST  Date Value Ref Range Status  06/27/2022 14 10 - 35 U/L Final         Passed - Cr in normal range and within 360 days    Creat  Date Value Ref Range Status  06/27/2022 1.12 0.70 - 1.22 mg/dL Final         Passed - Last BP in normal range    BP Readings from Last 1 Encounters:  09/27/22 122/70         Passed - Last Heart Rate in normal range    Pulse Readings from Last 1 Encounters:  09/27/22 75         Passed - Valid encounter within last 6 months    Recent Outpatient Visits           1 week ago Essential hypertension   [facility] Berniece Salines, [name]   3 months ago Essential hypertension   [place] [facility] Mecum, Oswaldo Conroy, PA-C   1 year ago Urinary frequency   [facility] [place] Dense M, DO   1 year ago Acute cystitis without hematuria   [facility] Berniece Salines, [name]   1 year ago Chills (without fever)   [facility] Berniece Salines, [name]       Future Appointments             In 1 month [name], Elana Alm [facility] [place]   In 5 months Zane Herald, Rudolpho Sevin, [name] [facility]             busPIRone (BUSPAR) 10 MG tablet [Pharmacy Med Name: BUSPIRONE HCL 10 MG TABLET] 180 tablet 1    Sig: TAKE 1 TABLET (10 MG TOTAL) BY MOUTH 2 (TWO) TIMES DAILY AS NEEDED (FOR ANXIETY).     Psychiatry: Anxiolytics/Hypnotics -  Non-controlled Passed - 10/08/2022  9:19 AM      Passed - Valid encounter within last 12 months    Recent Outpatient Visits           1 week ago Essential hypertension   [facility] Berniece Salines, [name]   3 months ago Essential hypertension   [facility] Mecum, Oswaldo Conroy, PA-C   1 year ago Urinary frequency   [facility] Caro Laroche, DO   1 year ago Acute cystitis without hematuria   [facility] Berniece Salines, [name]   1 year ago Chills (without fever)   [facility] Berniece Salines, [name]       Future Appointments             In 1 month [name], Elana Alm [facility] [place]  In 5 months Zane Herald, Rudolpho Sevin, [name] [facility]

## 2022-10-12 ENCOUNTER — Other Ambulatory Visit: Payer: Self-pay

## 2022-10-12 ENCOUNTER — Encounter: Payer: Self-pay | Admitting: Nurse Practitioner

## 2022-10-12 ENCOUNTER — Ambulatory Visit (INDEPENDENT_AMBULATORY_CARE_PROVIDER_SITE_OTHER): Payer: PPO | Admitting: Nurse Practitioner

## 2022-10-12 VITALS — BP 124/72 | HR 85 | Temp 98.1°F | Resp 16 | Ht 73.0 in | Wt 218.4 lb

## 2022-10-12 DIAGNOSIS — H6123 Impacted cerumen, bilateral: Secondary | ICD-10-CM | POA: Diagnosis not present

## 2022-10-12 NOTE — Progress Notes (Signed)
BP 124/72   Pulse 85   Temp 98.1 F (36.7 C) (Oral)   Resp 16   Ht 6\' 1"  (1.854 m)   Wt 218 lb 6.4 oz (99.1 kg)   SpO2 96%   BMI 28.81 kg/m    Subjective:    Patient ID: Jeffery Campbell, male    DOB: 10-Jul-1928, 89 y.o.   MRN: [id number]  HPI: Jeffery Campbell is a 89 y.o. male  Chief Complaint  Patient presents with   Ear Fullness   Cerumen impaction: patient reports ear fullness for a few weeks.  He often has issues with excessive wax.  He would like to have his ears cleaned out.    Verbal consent given Possible side effects discussed with patient Ears were  lavaged with warm water and peroxide  Patient tolerated procedure well No complications    Relevant past medical, surgical, family and social history reviewed and updated as indicated. Interim medical history since our last visit reviewed. Allergies and medications reviewed and updated.  Review of Systems  Constitutional: Negative for fever or weight change.  HEENT: positive for ear impaction Respiratory: Negative for cough and shortness of breath.   Cardiovascular: Negative for chest pain or palpitations.  Gastrointestinal: Negative for abdominal pain, no bowel changes.  Musculoskeletal: Negative for gait problem or joint swelling.  Skin: Negative for rash.  Neurological: Negative for dizziness or headache.  No other specific complaints in a complete review of systems (except as listed in HPI above).      Objective:    BP 124/72   Pulse 85   Temp 98.1 F (36.7 C) (Oral)   Resp 16   Ht 6\' 1"  (1.854 m)   Wt 218 lb 6.4 oz (99.1 kg)   SpO2 96%   BMI 28.81 kg/m   Wt Readings from Last 3 Encounters:  10/12/22 218 lb 6.4 oz (99.1 kg)  09/27/22 219 lb 6.4 oz (99.5 kg)  06/27/22 224 lb 14.4 oz (102 kg)    Physical Exam  Constitutional: Patient appears well-developed and well-nourished. Obese  No distress.  HEENT: head atraumatic, normocephalic, pupils equal and reactive to light, ears TMS unable to visualize,  cerumen impaction, after lavage TMS clear, neck supple, throat within normal limits Cardiovascular: Normal rate, regular rhythm and normal heart sounds.  No murmur heard. No BLE edema. Pulmonary/Chest: Effort normal and breath sounds normal. No respiratory distress. Abdominal: Soft.  There is no tenderness. Psychiatric: Patient has a normal mood and affect. behavior is normal. Judgment and thought content normal.  Results for orders placed or performed in visit on 06/27/22  COMPLETE METABOLIC PANEL WITH GFR  Result Value Ref Range   Glucose, Bld 98 65 - 99 mg/dL   BUN 12 7 - 25 mg/dL   Creat 5.78 4.69 - 6.29 mg/dL   eGFR 62 > OR = 60 BM/WUX/3.24M0   BUN/Creatinine Ratio SEE NOTE: 6 - 22 (calc)   Sodium 140 135 - 146 mmol/L   Potassium 3.8 3.5 - 5.3 mmol/L   Chloride 105 98 - 110 mmol/L   CO2 29 20 - 32 mmol/L   Calcium 9.2 8.6 - 10.3 mg/dL   Total Protein 6.4 6.1 - 8.1 g/dL   Albumin 3.9 3.6 - 5.1 g/dL   Globulin 2.5 1.9 - 3.7 g/dL (calc)   AG Ratio 1.6 1.0 - 2.5 (calc)   Total Bilirubin 1.8 (H) 0.2 - 1.2 mg/dL   Alkaline phosphatase (APISO) 42 35 - 144 U/L   AST 14  10 - 35 U/L   ALT 10 9 - 46 U/L  CBC w/Diff/Platelet  Result Value Ref Range   WBC 6.2 3.8 - 10.8 Thousand/uL   RBC 5.35 4.20 - 5.80 Million/uL   Hemoglobin 16.8 13.2 - 17.1 g/dL   HCT 16.1 09.6 - 04.5 %   MCV 91.0 80.0 - 100.0 fL   MCH 31.4 27.0 - 33.0 pg   MCHC 34.5 32.0 - 36.0 g/dL   RDW 40.9 81.1 - 91.4 %   Platelets 189 140 - 400 Thousand/uL   MPV 11.2 7.5 - 12.5 fL   Neutro Abs 4,470 1,500 - 7,800 cells/uL   Lymphs Abs 1,066 850 - 3,900 cells/uL   Absolute Monocytes 564 200 - 950 cells/uL   Eosinophils Absolute 68 15 - 500 cells/uL   Basophils Absolute 31 0 - 200 cells/uL   Neutrophils Relative % 72.1 %   Total Lymphocyte 17.2 %   Monocytes Relative 9.1 %   Eosinophils Relative 1.1 %   Basophils Relative 0.5 %  Lipid Profile  Result Value Ref Range   Cholesterol 123 <200 mg/dL   HDL 50 > OR = 40  mg/dL   Triglycerides 58 <782 mg/dL   LDL Cholesterol (Calc) 60 mg/dL (calc)   Total CHOL/HDL Ratio 2.5 <5.0 (calc)   Non-HDL Cholesterol (Calc) 73 <956 mg/dL (calc)  O13  Result Value Ref Range   Vitamin B-12 204 200 - 1,100 pg/mL  HgB A1c  Result Value Ref Range   Hgb A1c MFr Bld 5.2 <5.7 % of total Hgb   Mean Plasma Glucose 103 mg/dL   eAG (mmol/L) 5.7 mmol/L      Assessment & Plan:   Problem List Items Addressed This Visit   None Visit Diagnoses     Bilateral impacted cerumen    -  Primary   ear lavage performed, tolerated well   Relevant Orders   Ear Lavage       Verbal consent given Possible side effects discussed with patient Ears were  lavaged with warm water and peroxide  Patient tolerated procedure well No complications   Follow up plan: Return if symptoms worsen or fail to improve.

## 2022-10-24 DIAGNOSIS — N390 Urinary tract infection, site not specified: Secondary | ICD-10-CM | POA: Diagnosis not present

## 2022-10-30 DIAGNOSIS — N390 Urinary tract infection, site not specified: Secondary | ICD-10-CM | POA: Diagnosis not present

## 2022-11-09 DIAGNOSIS — Z6829 Body mass index (BMI) 29.0-29.9, adult: Secondary | ICD-10-CM | POA: Diagnosis not present

## 2022-11-09 DIAGNOSIS — I10 Essential (primary) hypertension: Secondary | ICD-10-CM | POA: Diagnosis not present

## 2022-11-09 DIAGNOSIS — I739 Peripheral vascular disease, unspecified: Secondary | ICD-10-CM | POA: Diagnosis not present

## 2022-11-09 DIAGNOSIS — D692 Other nonthrombocytopenic purpura: Secondary | ICD-10-CM | POA: Diagnosis not present

## 2022-11-28 NOTE — Progress Notes (Unsigned)
11/29/2022  3:25 PM   Jeffery Campbell 1928-05-13 [id number]  Referring provider: Berniece Salines, [name] [address] [place],  [place] [address]   Urological history: 1. BPH with LU TS -alfuzosin 10 mg daily and finasteride 5 mg daily  Chief Complaint  Patient presents with   Follow-up   Benign Prostatic Hypertrophy    HPI: Jeffery Campbell is a 89 y.o. male who presents for a yearly visit.   Previous records reviewed.    I PSS 7/3  No complaints.  Patient denies any modifying or aggravating factors.  Patient denies any recent UTI's, gross hematuria, dysuria or suprapubic/flank pain.  Patient denies any fevers, chills, nausea or vomiting.     IPSS     Row Name 11/29/22 1400         International Prostate Symptom Score   How often have you had the sensation of not emptying your bladder? Not at All     How often have you had to urinate less than every two hours? Less than 1 in 5 times     How often have you found you stopped and started again several times when you urinated? Less than 1 in 5 times     How often have you found it difficult to postpone urination? Not at All     How often have you had a weak urinary stream? Less than 1 in 5 times     How often have you had to strain to start urination? Not at All     How many times did you typically get up at night to urinate? 4 Times     Total IPSS Score 7       Quality of Life due to urinary symptoms   If you were to spend the rest of your life with your urinary condition just the way it is now how would you feel about that? Mostly Satisfied                 Score:  1-7 Mild 8-19 Moderate 20-35 Severe   PMH: Past Medical History:  Diagnosis Date   Arrhythmia    Cough    CHRONIC   Depression    Disturbance of sleep    Diverticulitis    GERD (gastroesophageal reflux disease)    Hemorrhoids    Hypertension    OA (osteoarthritis)    Pure hypercholesterolemia    Sleep apnea    Stroke [facility])      Surgical History: Past Surgical History:  Procedure Laterality Date   CATARACT EXTRACTION W/PHACO Left 02/14/2016   Procedure: CATARACT EXTRACTION PHACO AND INTRAOCULAR LENS PLACEMENT (IOC);  Surgeon: Galen Manila, MD;  Location: [facility] ORS;  Service: Ophthalmology;  Laterality: Left;  [place] 53.7 AP% 21.5 CDE 11.58 FLUID PACK LOT # [id number]   CATARACT EXTRACTION W/PHACO Right 12/18/2018   Procedure: CATARACT EXTRACTION PHACO AND INTRAOCULAR LENS PLACEMENT (IOC)  RIGHT, VISION BLUE;  Surgeon: Elliot Cousin, MD;  Location: [facility] ORS;  Service: Ophthalmology;  Laterality: Right;  [place]  01:14 CDE 11.82 Fluid pack lot # [id number]   COLONOSCOPY     KNEE ARTHROSCOPY     TRANSURETHRAL RESECTION OF PROSTATE      Home Medications:  Allergies as of 11/29/2022   No Known Allergies      Medication List        Accurate as of November 29, 2022  3:25 PM. If you have any questions, ask your nurse or doctor.  acetaminophen 325 MG tablet Commonly known as: TYLENOL Take 325-650 mg by mouth every 6 (six) hours as needed for moderate pain or headache.   alfuzosin 10 MG 24 hr tablet Commonly known as: UROXATRAL Take 1 tablet (10 mg total) by mouth daily with breakfast.   aspirin EC 81 MG tablet Take 1 tablet (81 mg total) by mouth daily. Swallow whole.   atorvastatin 20 MG tablet Commonly known as: LIPITOR TAKE 1 TABLET BY MOUTH EVERYDAY AT BEDTIME   busPIRone 10 MG tablet Commonly known as: BUSPAR TAKE 1 TABLET (10 MG TOTAL) BY MOUTH 2 (TWO) TIMES DAILY AS NEEDED (FOR ANXIETY).   diltiazem 120 MG 24 hr capsule Commonly known as: CARDIZEM CD TAKE 1 CAPSULE BY MOUTH EVERY DAY   finasteride 5 MG tablet Commonly known as: PROSCAR Take 1 tablet (5 mg total) by mouth daily.   Fish Oil 1200 MG Caps Take 2 capsules (2,400 mg total) by mouth in the morning and at bedtime.   fluticasone 50 MCG/ACT nasal spray Commonly known as: FLONASE PLACE 2 SPRAYS INTO BOTH NOSTRILS DAILY AS  NEEDED FOR ALLERGIES OR RHINITIS.   levocetirizine 5 MG tablet Commonly known as: XYZAL TAKE 1 TABLET BY MOUTH EVERY DAY IN THE EVENING   meclizine 12.5 MG tablet Commonly known as: ANTIVERT Take 1 tablet (12.5 mg total) by mouth daily as needed for dizziness (vertigo episodes). Use sparingly.  Must get appointment if using daily or symptoms worsen   Systane Balance 0.6 % Soln Generic drug: Propylene Glycol Place 1 drop into both eyes 3 (three) times daily as needed (dry eyes).        Allergies: No Known Allergies  Family History: Family History  Problem Relation Age of Onset   Hypertension Son        Posey Rea of parents health.   Prostate cancer Neg Hx    Bladder Cancer Neg Hx    Kidney cancer Neg Hx     Social History:  reports that he has never smoked. He has never used smokeless tobacco. He reports that he does not drink alcohol and does not use drugs.  ROS: For pertinent review of systems please refer to history of present illness  Physical Exam: BP 137/72   Pulse 79   Ht 6\' 1"  (1.854 m)   Wt 218 lb 11.2 oz (99.2 kg)   BMI 28.85 kg/m   Constitutional:  Well nourished. Alert and oriented, No acute distress.  Laboratory Data: Lab Results  Component Value Date   WBC 6.2 06/27/2022   HGB 16.8 06/27/2022   HCT 48.7 06/27/2022   MCV 91.0 06/27/2022   PLT 189 06/27/2022    Lab Results  Component Value Date   CREATININE 1.12 06/27/2022   Component     Latest Ref Rng 06/27/2022  Hemoglobin A1C     <5.7 % of total Hgb 5.2   Mean Plasma Glucose     mg/dL 161   eAG (mmol/L)     mmol/L 5.7    I have reviewed the labs.   Pertinent imaging N/A  Assessment & Plan:    1. BPH with LUTS -At goal with his urinary symptoms -continue conservative management, avoiding bladder irritants and timed voiding's -Continue alfuzosin 10 mg daily and finasteride 5 mg daily    Return if symptoms worsen or fail to improve.  These notes generated with voice recognition  software. I apologize for typographical errors.  Cloretta Ned  [facility] [address]  [address]  [address] [place], [place] [address] [phone]

## 2022-11-29 ENCOUNTER — Encounter: Payer: Self-pay | Admitting: Urology

## 2022-11-29 ENCOUNTER — Ambulatory Visit: Payer: PPO | Admitting: Urology

## 2022-11-29 VITALS — BP 137/72 | HR 79 | Ht 73.0 in | Wt 218.7 lb

## 2022-11-29 DIAGNOSIS — N401 Enlarged prostate with lower urinary tract symptoms: Secondary | ICD-10-CM

## 2022-11-29 DIAGNOSIS — N138 Other obstructive and reflux uropathy: Secondary | ICD-10-CM

## 2022-11-29 MED ORDER — ALFUZOSIN HCL ER 10 MG PO TB24
10.0000 mg | ORAL_TABLET | Freq: Every day | ORAL | 3 refills | Status: DC
Start: 2022-11-29 — End: 2023-12-17

## 2022-11-29 MED ORDER — FINASTERIDE 5 MG PO TABS
5.0000 mg | ORAL_TABLET | Freq: Every day | ORAL | 3 refills | Status: DC
Start: 2022-11-29 — End: 2023-12-17

## 2023-01-17 DIAGNOSIS — H6123 Impacted cerumen, bilateral: Secondary | ICD-10-CM | POA: Diagnosis not present

## 2023-01-22 DIAGNOSIS — H6123 Impacted cerumen, bilateral: Secondary | ICD-10-CM | POA: Diagnosis not present

## 2023-01-25 ENCOUNTER — Other Ambulatory Visit: Payer: Self-pay | Admitting: Nurse Practitioner

## 2023-01-25 DIAGNOSIS — J329 Chronic sinusitis, unspecified: Secondary | ICD-10-CM

## 2023-01-25 NOTE — Telephone Encounter (Signed)
Requested Prescriptions  Pending Prescriptions Disp Refills   levocetirizine (XYZAL) 5 MG tablet [Pharmacy Med Name: LEVOCETIRIZINE 5 MG TABLET] 90 tablet 1    Sig: TAKE 1 TABLET BY MOUTH EVERY DAY IN THE EVENING     Ear, Nose, and Throat:  Antihistamines - levocetirizine dihydrochloride Passed - 01/25/2023  2:48 AM      Passed - Cr in normal range and within 360 days    Creat  Date Value Ref Range Status  06/27/2022 1.12 0.70 - 1.22 mg/dL Final         Passed - eGFR is 10 or above and within 360 days    GFR, Est African American  Date Value Ref Range Status  11/12/2019 63 > OR = 60 mL/min/1.53m2 Final   GFR, Est Non African American  Date Value Ref Range Status  11/12/2019 54 (L) > OR = 60 mL/min/1.79m2 Final   GFR, Estimated  Date Value Ref Range Status  02/08/2021 >60 >60 mL/min Final    Comment:    (NOTE) Calculated using the CKD-EPI Creatinine Equation (2021)    eGFR  Date Value Ref Range Status  06/27/2022 62 > OR = 60 mL/min/1.65m2 Final         Passed - Valid encounter within last 12 months    Recent Outpatient Visits           3 months ago Bilateral impacted cerumen   [facility] Berniece Salines, [name]   4 months ago Essential hypertension   [facility] Berniece Salines, [name]   7 months ago Essential hypertension   [place] [facility] Mecum, Oswaldo Conroy, PA-C   1 year ago Urinary frequency   [facility] Caro Laroche, DO   1 year ago Acute cystitis without hematuria   [facility] Berniece Salines, [name]       Future Appointments             In 2 months Zane Herald, Rudolpho Sevin, [name] [facility]

## 2023-01-28 DIAGNOSIS — Z961 Presence of intraocular lens: Secondary | ICD-10-CM | POA: Diagnosis not present

## 2023-01-31 DIAGNOSIS — F419 Anxiety disorder, unspecified: Secondary | ICD-10-CM | POA: Diagnosis not present

## 2023-01-31 DIAGNOSIS — I10 Essential (primary) hypertension: Secondary | ICD-10-CM | POA: Diagnosis not present

## 2023-02-05 DIAGNOSIS — F419 Anxiety disorder, unspecified: Secondary | ICD-10-CM | POA: Diagnosis not present

## 2023-02-21 ENCOUNTER — Other Ambulatory Visit: Payer: Self-pay | Admitting: Nurse Practitioner

## 2023-02-21 DIAGNOSIS — E782 Mixed hyperlipidemia: Secondary | ICD-10-CM

## 2023-02-22 NOTE — Telephone Encounter (Signed)
Requested Prescriptions  Pending Prescriptions Disp Refills   atorvastatin (LIPITOR) 20 MG tablet [Pharmacy Med Name: ATORVASTATIN 20 MG TABLET] 90 tablet 1    Sig: TAKE 1 TABLET BY MOUTH EVERYDAY AT BEDTIME     Cardiovascular:  Antilipid - Statins Failed - 02/21/2023  2:39 AM      Failed - Lipid Panel in normal range within the last 12 months    Cholesterol, Total  Date Value Ref Range Status  09/22/2014 134 100 - 199 mg/dL Final   Cholesterol  Date Value Ref Range Status  06/27/2022 123 <200 mg/dL Final   LDL Cholesterol (Calc)  Date Value Ref Range Status  06/27/2022 60 mg/dL (calc) Final    Comment:    Reference range: <100 . Desirable range <100 mg/dL for primary prevention;   <70 mg/dL for patients with CHD or diabetic patients  with > or = 2 CHD risk factors. [name] LDL-C is now calculated using the Mcenery-Hopkins  calculation, which is a validated novel method providing  better accuracy than the Friedewald equation in the  estimation of LDL-C.  Horald Pollen et al. Lenox Ahr. 1610;960(45): 2061-2068  (http://education.[facility].com/faq/FAQ164)    HDL  Date Value Ref Range Status  06/27/2022 50 > OR = 40 mg/dL Final  40/98/1191 46 >47 mg/dL Final    Comment:    According to ATP-III Guidelines, HDL-C >59 mg/dL is considered a negative risk factor for CHD.    Triglycerides  Date Value Ref Range Status  06/27/2022 58 <150 mg/dL Final         Passed - Patient is not pregnant      Passed - Valid encounter within last 12 months    Recent Outpatient Visits           4 months ago Bilateral impacted cerumen   [facility] Berniece Salines, [name]   4 months ago Essential hypertension   [facility] Berniece Salines, [name]   8 months ago Essential hypertension   [facility] Mecum, Oswaldo Conroy, PA-C   1 year ago Urinary frequency   [facility] Caro Laroche, DO   1  year ago Acute cystitis without hematuria   [facility] Berniece Salines, [name]       Future Appointments             In 1 month Zane Herald, Rudolpho Sevin, [name] [facility], [facility]

## 2023-03-29 ENCOUNTER — Ambulatory Visit: Payer: PPO | Admitting: Nurse Practitioner

## 2023-03-31 ENCOUNTER — Other Ambulatory Visit: Payer: Self-pay | Admitting: Nurse Practitioner

## 2023-03-31 DIAGNOSIS — F419 Anxiety disorder, unspecified: Secondary | ICD-10-CM

## 2023-04-04 NOTE — Telephone Encounter (Signed)
 Requested Prescriptions  Pending Prescriptions Disp Refills   busPIRone  (BUSPAR ) 10 MG tablet [Pharmacy Med Name: BUSPIRONE  HCL 10 MG TABLET] 180 tablet 0    Sig: TAKE 1 TABLET (10 MG TOTAL) BY MOUTH 2 (TWO) TIMES DAILY AS NEEDED (FOR ANXIETY).     Psychiatry: Anxiolytics/Hypnotics - Non-controlled Passed - 04/04/2023  2:01 PM      Passed - Valid encounter within last 12 months    Recent Outpatient Visits           5 months ago Bilateral impacted cerumen   [facility] Jeffery Jeffery FALCON, [name]   6 months ago Essential hypertension   [facility] Jeffery Jeffery FALCON, [name]   9 months ago Essential hypertension   [facility] Mecum, Jeffery BRAVO, PA-C   1 year ago Urinary frequency   [facility] Jeffery Donald HERO, DO   1 year ago Acute cystitis without hematuria   [facility] Jeffery Jeffery FALCON, [name]       Future Appointments             In 4 days Jeffery, Jeffery FALCON, [name] [facility], [facility]

## 2023-04-08 ENCOUNTER — Ambulatory Visit: Payer: PPO | Admitting: Nurse Practitioner

## 2023-04-11 ENCOUNTER — Ambulatory Visit (INDEPENDENT_AMBULATORY_CARE_PROVIDER_SITE_OTHER): Payer: PPO | Admitting: Nurse Practitioner

## 2023-04-11 ENCOUNTER — Encounter: Payer: Self-pay | Admitting: Nurse Practitioner

## 2023-04-11 VITALS — BP 136/84 | HR 84 | Temp 97.8°F | Resp 18 | Ht 73.0 in | Wt 220.4 lb

## 2023-04-11 DIAGNOSIS — N183 Chronic kidney disease, stage 3 unspecified: Secondary | ICD-10-CM

## 2023-04-11 DIAGNOSIS — E66811 Obesity, class 1: Secondary | ICD-10-CM

## 2023-04-11 DIAGNOSIS — N401 Enlarged prostate with lower urinary tract symptoms: Secondary | ICD-10-CM

## 2023-04-11 DIAGNOSIS — Z23 Encounter for immunization: Secondary | ICD-10-CM | POA: Diagnosis not present

## 2023-04-11 DIAGNOSIS — K219 Gastro-esophageal reflux disease without esophagitis: Secondary | ICD-10-CM

## 2023-04-11 DIAGNOSIS — I10 Essential (primary) hypertension: Secondary | ICD-10-CM

## 2023-04-11 DIAGNOSIS — R42 Dizziness and giddiness: Secondary | ICD-10-CM

## 2023-04-11 DIAGNOSIS — G8194 Hemiplegia, unspecified affecting left nondominant side: Secondary | ICD-10-CM

## 2023-04-11 DIAGNOSIS — F330 Major depressive disorder, recurrent, mild: Secondary | ICD-10-CM | POA: Insufficient documentation

## 2023-04-11 DIAGNOSIS — E6609 Other obesity due to excess calories: Secondary | ICD-10-CM

## 2023-04-11 DIAGNOSIS — F419 Anxiety disorder, unspecified: Secondary | ICD-10-CM

## 2023-04-11 DIAGNOSIS — E782 Mixed hyperlipidemia: Secondary | ICD-10-CM

## 2023-04-11 DIAGNOSIS — F3342 Major depressive disorder, recurrent, in full remission: Secondary | ICD-10-CM

## 2023-04-11 DIAGNOSIS — Z6830 Body mass index (BMI) 30.0-30.9, adult: Secondary | ICD-10-CM

## 2023-04-11 MED ORDER — DILTIAZEM HCL ER COATED BEADS 120 MG PO CP24: ORAL_CAPSULE | ORAL | 1 refills | Status: DC

## 2023-04-11 NOTE — Progress Notes (Signed)
 BP 136/84   Pulse 84   Temp 97.8 F (36.6 C)   Resp 18   Ht 6' 1 (1.854 m)   Wt 220 lb 6.4 oz (100 kg)   BMI 29.08 kg/m    Subjective:    Patient ID: Jeffery Campbell, male    DOB: January 21, 1929, 89 y.o.   MRN: [id number]  HPI: Jeffery Campbell is a 89 y.o. male  Chief Complaint  Patient presents with   Medical Management of Chronic Issues    Discussed the use of AI scribe software for clinical note transcription with the patient, who gave verbal consent to proceed.  History of Present Illness   Tito, a patient with a history of hypertension, GERD, left hemiparesis, chronic kidney disease, mixed hyperlipidemia, obesity, anxiety, depression, and prediabetes, reports feeling well overall. He has been managing his multiple conditions with a regimen of alfuzosin , aspirin , atorvastatin , Buspar , diltiazem , finasteride , Flonase , Xyzal , and Antivert . He has not experienced any new or worsening symptoms. He reports occasional dizziness, but has not needed to take his prescribed medication for this in several months. He also reports needing to urinate two to three times at night, but does not express this as a concern. He is also followed by urology for BPH.       04/11/2023    9:35 AM 10/12/2022   12:58 PM 09/27/2022    9:47 AM  Depression screen PHQ 2/9  Decreased Interest 0 0 0  Down, Depressed, Hopeless 0 0 0  PHQ - 2 Score 0 0 0  Altered sleeping 1  0  Tired, decreased energy 1  0  Change in appetite 0  0  Feeling bad or failure about yourself  0  0  Trouble concentrating 0  0  Moving slowly or fidgety/restless 1  0  Suicidal thoughts 0  0  PHQ-9 Score 3  0  Difficult doing work/chores Not difficult at all  Not difficult at all       04/11/2023    9:35 AM 09/27/2022    9:47 AM 06/27/2022   10:01 AM 05/03/2021   10:22 AM  GAD 7 : Generalized Anxiety Score  Nervous, Anxious, on Edge 0 1 0 0  Control/stop worrying 0 1 0 0  Worry too much - different things 0 0 0 0  Trouble relaxing 0 0 0 0   Restless 0 0 0 0  Easily annoyed or irritable 0  0 0  Afraid - awful might happen 0 0 0 0  Total GAD 7 Score 0  0 0  Anxiety Difficulty Not difficult at all Not difficult at all Not difficult at all Not difficult at all     Relevant past medical, surgical, family and social history reviewed and updated as indicated. Interim medical history since our last visit reviewed. Allergies and medications reviewed and updated.  Review of Systems  Constitutional: Negative for fever or weight change.  Respiratory: Negative for cough and shortness of breath.   Cardiovascular: Negative for chest pain or palpitations.  Gastrointestinal: Negative for abdominal pain, no bowel changes.  Musculoskeletal: Negative for gait problem or joint swelling.  Skin: Negative for rash.  Neurological: Negative for dizziness or headache.  No other specific complaints in a complete review of systems (except as listed in HPI above).      Objective:    BP 136/84   Pulse 84   Temp 97.8 F (36.6 C)   Resp 18   Ht 6' 1 (1.854 m)  Wt 220 lb 6.4 oz (100 kg)   BMI 29.08 kg/m   BP Readings from Last 3 Encounters:  04/11/23 136/84  11/29/22 137/72  10/12/22 124/72     Wt Readings from Last 3 Encounters:  04/11/23 220 lb 6.4 oz (100 kg)  11/29/22 218 lb 11.2 oz (99.2 kg)  10/12/22 218 lb 6.4 oz (99.1 kg)    Physical Exam  Constitutional: Patient appears well-developed and well-nourished.  No distress.  HEENT: head atraumatic, normocephalic, pupils equal and reactive to light, ears Tms clear, neck supple, throat within normal limits Cardiovascular: Normal rate, regular rhythm and normal heart sounds.  No murmur heard. No BLE edema. Pulmonary/Chest: Effort normal and breath sounds normal. No respiratory distress. Abdominal: Soft.  There is no tenderness. Psychiatric: Patient has a normal mood and affect. behavior is normal. Judgment and thought content normal.  Results for orders placed or performed in  visit on 06/27/22  COMPLETE METABOLIC PANEL WITH GFR   Collection Time: 06/27/22 10:31 AM  Result Value Ref Range   Glucose, Bld 98 65 - 99 mg/dL   BUN 12 7 - 25 mg/dL   Creat 8.87 9.29 - 8.77 mg/dL   eGFR 62 > OR = 60 fO/fpw/8.26f7   BUN/Creatinine Ratio SEE NOTE: 6 - 22 (calc)   Sodium 140 135 - 146 mmol/L   Potassium 3.8 3.5 - 5.3 mmol/L   Chloride 105 98 - 110 mmol/L   CO2 29 20 - 32 mmol/L   Calcium  9.2 8.6 - 10.3 mg/dL   Total Protein 6.4 6.1 - 8.1 g/dL   Albumin 3.9 3.6 - 5.1 g/dL   Globulin 2.5 1.9 - 3.7 g/dL (calc)   AG Ratio 1.6 1.0 - 2.5 (calc)   Total Bilirubin 1.8 (H) 0.2 - 1.2 mg/dL   Alkaline phosphatase (APISO) 42 35 - 144 U/L   AST 14 10 - 35 U/L   ALT 10 9 - 46 U/L  CBC w/Diff/Platelet   Collection Time: 06/27/22 10:31 AM  Result Value Ref Range   WBC 6.2 3.8 - 10.8 Thousand/uL   RBC 5.35 4.20 - 5.80 Million/uL   Hemoglobin 16.8 13.2 - 17.1 g/dL   HCT 51.2 61.4 - 49.9 %   MCV 91.0 80.0 - 100.0 fL   MCH 31.4 27.0 - 33.0 pg   MCHC 34.5 32.0 - 36.0 g/dL   RDW 87.7 88.9 - 84.9 %   Platelets 189 140 - 400 Thousand/uL   MPV 11.2 7.5 - 12.5 fL   Neutro Abs 4,470 1,500 - 7,800 cells/uL   Lymphs Abs 1,066 850 - 3,900 cells/uL   Absolute Monocytes 564 200 - 950 cells/uL   Eosinophils Absolute 68 15 - 500 cells/uL   Basophils Absolute 31 0 - 200 cells/uL   Neutrophils Relative % 72.1 %   Total Lymphocyte 17.2 %   Monocytes Relative 9.1 %   Eosinophils Relative 1.1 %   Basophils Relative 0.5 %  Lipid Profile   Collection Time: 06/27/22 10:31 AM  Result Value Ref Range   Cholesterol 123 <200 mg/dL   HDL 50 > OR = 40 mg/dL   Triglycerides 58 <849 mg/dL   LDL Cholesterol (Calc) 60 mg/dL (calc)   Total CHOL/HDL Ratio 2.5 <5.0 (calc)   Non-HDL Cholesterol (Calc) 73 <869 mg/dL (calc)  A87   Collection Time: 06/27/22 10:31 AM  Result Value Ref Range   Vitamin B-12 204 200 - 1,100 pg/mL  HgB A1c   Collection Time: 06/27/22 10:31 AM  Result Value  Ref Range    Hgb A1c MFr Bld 5.2 <5.7 % of total Hgb   Mean Plasma Glucose 103 mg/dL   eAG (mmol/L) 5.7 mmol/L   Last metabolic panel Lab Results  Component Value Date   GLUCOSE 98 06/27/2022   NA 140 06/27/2022   K 3.8 06/27/2022   CL 105 06/27/2022   CO2 29 06/27/2022   BUN 12 06/27/2022   CREATININE 1.12 06/27/2022   EGFR 62 06/27/2022   CALCIUM  9.2 06/27/2022   PROT 6.4 06/27/2022   ALBUMIN 4.1 02/24/2018   LABGLOB 2.4 12/29/2014   AGRATIO 1.8 12/29/2014   BILITOT 1.8 (H) 06/27/2022   ALKPHOS 48 02/24/2018   AST 14 06/27/2022   ALT 10 06/27/2022   ANIONGAP 9 02/08/2021   Last lipids Lab Results  Component Value Date   CHOL 123 06/27/2022   HDL 50 06/27/2022   LDLCALC 60 06/27/2022   TRIG 58 06/27/2022   CHOLHDL 2.5 06/27/2022   Last hemoglobin A1c Lab Results  Component Value Date   HGBA1C 5.2 06/27/2022        Assessment & Plan:   Problem List Items Addressed This Visit       Cardiovascular and Mediastinum   Essential hypertension - Primary (Chronic)   Relevant Medications   diltiazem  (CARDIZEM  CD) 120 MG 24 hr capsule   Other Relevant Orders   COMPLETE METABOLIC PANEL WITH GFR   CBC with Differential/Platelet     Digestive   Gastro-esophageal reflux disease without esophagitis     Nervous and Auditory   Left hemiparesis (HCC) (Chronic)   Relevant Orders   COMPLETE METABOLIC PANEL WITH GFR   Lipid panel     Genitourinary   CKD (chronic kidney disease) stage 3, GFR 30-59 ml/min (HCC)   Relevant Orders   COMPLETE METABOLIC PANEL WITH GFR   Benign localized prostatic hyperplasia with lower urinary tract symptoms (LUTS)     Other   HLD (hyperlipidemia) (Chronic)   Relevant Medications   diltiazem  (CARDIZEM  CD) 120 MG 24 hr capsule   Other Relevant Orders   COMPLETE METABOLIC PANEL WITH GFR   Lipid panel   Class 1 obesity due to excess calories with serious comorbidity and body mass index (BMI) of 30.0 to 30.9 in adult   Dizziness   Anxiety with  depression   Mild episode of recurrent major depressive disorder (HCC)   Other Visit Diagnoses       Need for influenza vaccination       Relevant Orders   Flu Vaccine Trivalent High Dose (Fluad) (Completed)        Assessment and Plan    Benign Prostatic Hyperplasia Reports nocturia 2-3 times per night. Currently on Alfuzosin  10mg  daily and Finasteride  5mg  daily. No changes recommended at this time.  Vertigo Reports one episode of dizziness in the past 2-3 months. Currently on Antivert  as needed. No changes recommended at this time.  Hypertension Blood pressure controlled at 136/84. Currently on Diltiazem  120mg  daily. Continue current regimen.  Mixed Hyperlipidemia/left hemiparesis Currently on Atorvastatin  20mg  daily. Continue current regimen.  Anxiety/Depression Currently on Buspar  10mg  twice daily as needed. Continue current regimen.  General Health Maintenance -Order routine blood work to monitor internal health. -Continue Aspirin  81mg  daily for cardiovascular health. -Continue Flonase  and Xyzal  as needed for allergies.        Follow up plan: Return in about 6 months (around 10/09/2023) for follow up.

## 2023-04-12 LAB — COMPLETE METABOLIC PANEL WITH GFR
AG Ratio: 1.7 (calc) (ref 1.0–2.5)
ALT: 11 U/L (ref 9–46)
AST: 16 U/L (ref 10–35)
Albumin: 4 g/dL (ref 3.6–5.1)
Alkaline phosphatase (APISO): 47 U/L (ref 35–144)
BUN/Creatinine Ratio: 11 (calc) (ref 6–22)
BUN: 13 mg/dL (ref 7–25)
CO2: 24 mmol/L (ref 20–32)
Calcium: 9.2 mg/dL (ref 8.6–10.3)
Chloride: 105 mmol/L (ref 98–110)
Creat: 1.23 mg/dL — ABNORMAL HIGH (ref 0.70–1.22)
Globulin: 2.4 g/dL (ref 1.9–3.7)
Glucose, Bld: 99 mg/dL (ref 65–99)
Potassium: 4 mmol/L (ref 3.5–5.3)
Sodium: 139 mmol/L (ref 135–146)
Total Bilirubin: 1.5 mg/dL — ABNORMAL HIGH (ref 0.2–1.2)
Total Protein: 6.4 g/dL (ref 6.1–8.1)
eGFR: 55 mL/min/{1.73_m2} — ABNORMAL LOW (ref >=60)

## 2023-04-12 LAB — LIPID PANEL
Cholesterol: 115 mg/dL (ref <200)
HDL: 50 mg/dL (ref >=40)
LDL Cholesterol (Calc): 52 mg/dL
Non-HDL Cholesterol (Calc): 65 mg/dL (ref <130)
Total CHOL/HDL Ratio: 2.3 (calc) (ref <5.0)
Triglycerides: 52 mg/dL (ref <150)

## 2023-04-12 LAB — CBC WITH DIFFERENTIAL/PLATELET
Absolute Lymphocytes: 907 {cells}/uL (ref 850–3900)
Absolute Monocytes: 476 {cells}/uL (ref 200–950)
Basophils Absolute: 28 {cells}/uL (ref 0–200)
Basophils Relative: 0.5 %
Eosinophils Absolute: 39 {cells}/uL (ref 15–500)
Eosinophils Relative: 0.7 %
HCT: 47.5 % (ref 38.5–50.0)
Hemoglobin: 16.4 g/dL (ref 13.2–17.1)
MCH: 30.9 pg (ref 27.0–33.0)
MCHC: 34.5 g/dL (ref 32.0–36.0)
MCV: 89.5 fL (ref 80.0–100.0)
MPV: 11.3 fL (ref 7.5–12.5)
Monocytes Relative: 8.5 %
Neutro Abs: 4150 {cells}/uL (ref 1500–7800)
Neutrophils Relative %: 74.1 %
Platelets: 205 10*3/uL (ref 140–400)
RBC: 5.31 10*6/uL (ref 4.20–5.80)
RDW: 12 % (ref 11.0–15.0)
Total Lymphocyte: 16.2 %
WBC: 5.6 10*3/uL (ref 3.8–10.8)

## 2023-05-09 ENCOUNTER — Ambulatory Visit: Payer: PPO

## 2023-05-09 DIAGNOSIS — Z0000 Encounter for general adult medical examination without abnormal findings: Secondary | ICD-10-CM | POA: Diagnosis not present

## 2023-05-09 NOTE — Patient Instructions (Addendum)
 Jeffery Campbell , Thank you for taking time to come for your Medicare Wellness Visit. I appreciate your ongoing commitment to your health goals. Please review the following plan we discussed and let me know if I can assist you in the future.   Referrals/Orders/Follow-Ups/Clinician Recommendations: NONE  This is a list of the screening recommended for you and due dates:  Health Maintenance  Topic Date Due   COVID-19 Vaccine (6 - 2024-25 season) 12/02/2022   Zoster (Shingles) Vaccine (1 of 2) 07/10/2023*   DTaP/Tdap/Td vaccine (2 - Tdap) 04/10/2024*   Medicare Annual Wellness Visit  05/08/2024   Pneumonia Vaccine  Completed   Flu Shot  Completed   HPV Vaccine  Aged Out  *Topic was postponed. The date shown is not the original due date.    Advanced directives: (ACP Link)Information on Advanced Care Planning can be found at [place]  Secretary of [facility] Advance Health Care Directives Advance Health Care Directives ([email or web address]   Next Medicare Annual Wellness Visit scheduled for next year: Yes   05/14/24 @ 8:50 AM BY PHONE

## 2023-05-09 NOTE — Progress Notes (Signed)
 Subjective:   Jeffery Campbell is a 89 y.o. male who presents for Medicare Annual/Subsequent preventive examination.  Visit Complete: Virtual I connected with  [name] on 05/09/23 by a audio enabled telemedicine application and verified that I am speaking with the correct person using two identifiers.  This patient declined Interactive audio and acupuncturist. Therefore the visit was completed with audio only.   Patient Location: Home  Provider Location: Office/Clinic  I discussed the limitations of evaluation and management by telemedicine. The patient expressed understanding and agreed to proceed.  Vital Signs: Because this visit was a virtual/telehealth visit, some criteria may be missing or patient reported. Any vitals not documented were not able to be obtained and vitals that have been documented are patient reported.  Cardiac Risk Factors include: advanced age (>79men, >35 women);dyslipidemia;male gender;hypertension;sedentary lifestyle     Objective:    There were no vitals filed for this visit. There is no height or weight on file to calculate BMI.     05/09/2023    8:55 AM 05/04/2022    9:10 AM 02/08/2021    1:24 PM 11/01/2020    2:40 PM 02/10/2020    1:16 PM 10/29/2019    2:21 PM 02/10/2019    2:10 PM  Advanced Directives  Does Patient Have a Medical Advance Directive? No Yes Yes Yes Yes No Yes  Type of Special Educational Needs Teacher of [place];Living will Healthcare Power of [place];Living will Healthcare Power of [place];Living will Healthcare Power of [place];Living will  Healthcare Power of [place];Living will  Does patient want to make changes to medical advance directive?   No - Patient declined    No - Patient declined  Copy of Healthcare Power of Attorney in Chart?  No - copy requested No - copy requested No - copy requested No - copy requested  No - copy requested  Would patient like information on creating a medical advance directive? No -  Patient declined  No - Patient declined  No - Patient declined No - Patient declined No - Patient declined    Current Medications (verified) Outpatient Encounter Medications as of 05/09/2023  Medication Sig   acetaminophen  (TYLENOL ) 325 MG tablet Take 325-650 mg by mouth every 6 (six) hours as needed for moderate pain or headache.   alfuzosin  (UROXATRAL ) 10 MG 24 hr tablet Take 1 tablet (10 mg total) by mouth daily with breakfast.   aspirin  81 MG EC tablet Take 1 tablet (81 mg total) by mouth daily. Swallow whole.   atorvastatin  (LIPITOR) 20 MG tablet TAKE 1 TABLET BY MOUTH EVERYDAY AT BEDTIME   busPIRone  (BUSPAR ) 10 MG tablet TAKE 1 TABLET (10 MG TOTAL) BY MOUTH 2 (TWO) TIMES DAILY AS NEEDED (FOR ANXIETY).   diltiazem  (CARDIZEM  CD) 120 MG 24 hr capsule TAKE 1 CAPSULE BY MOUTH EVERY DAY   finasteride  (PROSCAR ) 5 MG tablet Take 1 tablet (5 mg total) by mouth daily.   fluticasone  (FLONASE ) 50 MCG/ACT nasal spray PLACE 2 SPRAYS INTO BOTH NOSTRILS DAILY AS NEEDED FOR ALLERGIES OR RHINITIS.   levocetirizine (XYZAL ) 5 MG tablet TAKE 1 TABLET BY MOUTH EVERY DAY IN THE EVENING   meclizine  (ANTIVERT ) 12.5 MG tablet Take 1 tablet (12.5 mg total) by mouth daily as needed for dizziness (vertigo episodes). Use sparingly.  Must get appointment if using daily or symptoms worsen   Omega-3 Fatty Acids (FISH OIL ) 1200 MG CAPS Take 2 capsules (2,400 mg total) by mouth in the morning and at bedtime.  Propylene Glycol (SYSTANE BALANCE) 0.6 % SOLN Place 1 drop into both eyes 3 (three) times daily as needed (dry eyes).   No facility-administered encounter medications on file as of 05/09/2023.    Allergies (verified) Patient has no known allergies.   History: Past Medical History:  Diagnosis Date   Arrhythmia    Cough    CHRONIC   Depression    Disturbance of sleep    Diverticulitis    GERD (gastroesophageal reflux disease)    Hemorrhoids    Hypertension    OA (osteoarthritis)    Pure hypercholesterolemia     Sleep apnea    Stroke [facility])    Past Surgical History:  Procedure Laterality Date   CATARACT EXTRACTION W/PHACO Left 02/14/2016   Procedure: CATARACT EXTRACTION PHACO AND INTRAOCULAR LENS PLACEMENT (IOC);  Surgeon: Elsie Carmine, MD;  Location: [facility] ORS;  Service: Ophthalmology;  Laterality: Left;  US  53.7 AP% 21.5 CDE 11.58 FLUID PACK LOT # [id number]   CATARACT EXTRACTION W/PHACO Right 12/18/2018   Procedure: CATARACT EXTRACTION PHACO AND INTRAOCULAR LENS PLACEMENT (IOC)  RIGHT, VISION BLUE;  Surgeon: Ferol Rogue, MD;  Location: [facility] ORS;  Service: Ophthalmology;  Laterality: Right;  US   01:14 CDE 11.82 Fluid pack lot # [id number]   COLONOSCOPY     KNEE ARTHROSCOPY     TRANSURETHRAL RESECTION OF PROSTATE     Family History  Problem Relation Age of Onset   Hypertension Son        Kurt of parents health.   Prostate cancer Neg Hx    Bladder Cancer Neg Hx    Kidney cancer Neg Hx    Social History   Socioeconomic History   Marital status: Divorced    Spouse name: Not on file   Number of children: 3   Years of education: Not on file   Highest education level: 11th grade  Occupational History   Occupation: Retired  Tobacco Use   Smoking status: Never   Smokeless tobacco: Never   Tobacco comments:    smoking cessation materials not required  Vaping Use   Vaping status: Never Used  Substance and Sexual Activity   Alcohol use: No    Alcohol/week: 0.0 standard drinks of alcohol   Drug use: No   Sexual activity: Not Currently  Other Topics Concern   Not on file  Social History Narrative   Lives at home alone.   Writes left-handed - does everything else with right-hand.   No caffeine use.      Never smoker; never alcohol; used to be truck driver; retired part time from systems analyst course. 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    Social Drivers of Corporate Investment Banker Strain: Low Risk  (05/09/2023)   Overall Financial Resource Strain (CARDIA)    Difficulty of Paying Living  Expenses: Not hard at all  Food Insecurity: No Food Insecurity (05/09/2023)   Hunger Vital Sign    Worried About Running Out of Food in the Last Year: Never true    Ran Out of Food in the Last Year: Never true  Transportation Needs: No Transportation Needs (05/09/2023)   PRAPARE - Administrator, Civil Service (Medical): No    Lack of Transportation (Non-Medical): No  Physical Activity: Insufficiently Active (05/09/2023)   Exercise Vital Sign    Days of Exercise per Week: 2 days    Minutes of Exercise per Session: 30 min  Stress: No Stress Concern Present (05/09/2023)   [employer] of Occupational Health - Occupational  Stress Questionnaire    Feeling of Stress : Not at all  Social Connections: Moderately Isolated (05/09/2023)   Social Connection and Isolation Panel [NHANES]    Frequency of Communication with Friends and Family: More than three times a week    Frequency of Social Gatherings with Friends and Family: Three times a week    Attends Religious Services: More than 4 times per year    Active Member of Clubs or Organizations: No    Attends Banker Meetings: Never    Marital Status: Divorced    Tobacco Counseling Counseling given: Not Answered Tobacco comments: smoking cessation materials not required   Clinical Intake:  Pre-visit preparation completed: Yes  Pain : No/denies pain     BMI - recorded: 29 Nutritional Status: BMI 25 -29 Overweight Nutritional Risks: None Diabetes: No  How often do you need to have someone help you when you read instructions, pamphlets, or other written materials from your doctor or pharmacy?: 1 - Never  Interpreter Needed?: No  Information entered by :: JHONNIE DAS, LPN   Activities of Daily Living    05/09/2023    8:56 AM 10/12/2022   12:58 PM  In your present state of health, do you have any difficulty performing the following activities:  Hearing? 1 1  Vision? 0 0  Difficulty concentrating or making  decisions? 0 0  Walking or climbing stairs? 1 1  Comment LEG PAIN   Dressing or bathing? 0 0  Doing errands, shopping? 0 1  Preparing Food and eating ? N   Using the Toilet? N   In the past six months, have you accidently leaked urine? N   Do you have problems with loss of bowel control? N   Managing your Medications? N   Managing your Finances? N   Housekeeping or managing your Housekeeping? N     Patient Care Team: Gareth Mliss FALCON, [name] as PCP - General (Nurse Practitioner) Rennie Cindy SAUNDERS, MD as Consulting Physician (Hematology and Oncology) Helon Clotilda DELENA DEVONNA as Physician Assistant (Urology) Jaye Fallow, MD as Referring Physician (Ophthalmology) Carolee Manus DASEN., MD (Ophthalmology)  Indicate any recent Medical Services you may have received from other than [facility] providers in the past year (date may be approximate).     Assessment:   This is a routine wellness examination for Vaun.  Hearing/Vision screen Hearing Screening - Comments:: NO AIDS Vision Screening - Comments:: WEARS GLASSES - DR.BELL   Goals Addressed             This Visit's Progress    DIET - EAT MORE FRUITS AND VEGETABLES         Depression Screen    05/09/2023    8:53 AM 04/11/2023    9:35 AM 10/12/2022   12:58 PM 09/27/2022    9:47 AM 06/27/2022   10:01 AM 05/04/2022    9:06 AM 09/20/2021    1:37 PM  PHQ 2/9 Scores  PHQ - 2 Score 0 0 0 0 0 0 0  PHQ- 9 Score 0 3  0 0      Fall Risk    05/09/2023    8:56 AM 04/11/2023    9:35 AM 10/12/2022   12:58 PM 09/27/2022    9:47 AM 06/27/2022   10:01 AM  Fall Risk   Falls in the past year? 0 0 0 0 0  Number falls in past yr: 0 0 0 0 0  Injury with Fall? 0 0 0 0 0  Risk for fall due to : No Fall Risks      Follow up Falls prevention discussed;Falls evaluation completed Falls evaluation completed       MEDICARE RISK AT HOME: Medicare Risk at Home Any stairs in or around the home?: No If so, are there any without handrails?: No Home free  of loose throw rugs in walkways, pet beds, electrical cords, etc?: Yes Adequate lighting in your home to reduce risk of falls?: Yes Life alert?: No Use of a cane, walker or w/c?: Yes (USES CANE) Grab bars in the bathroom?: Yes Shower chair or bench in shower?: Yes Elevated toilet seat or a handicapped toilet?: Yes  TIMED UP AND GO:  Was the test performed?  No    Cognitive Function:    10/26/2014    9:42 AM  MMSE - Mini Mental State Exam  Orientation to time 4  Orientation to Place 4  Registration 3  Attention/ Calculation 3  Recall 1  Language- name 2 objects 2  Language- repeat 1  Language- follow 3 step command 3  Language- read & follow direction 1  Write a sentence 1  Copy design 1  Total score 24        05/09/2023    8:58 AM 05/04/2022    9:11 AM 10/29/2019    2:24 PM 10/28/2018    2:29 PM 10/24/2017    8:40 AM  6CIT Screen  What Year? 0 points 0 points 0 points 0 points 0 points  What month? 0 points 0 points 0 points 0 points 0 points  What time? 0 points 0 points 0 points 0 points 3 points  Count back from 20 0 points 0 points 0 points 0 points 0 points  Months in reverse 0 points -- 2 points 2 points 4 points  Repeat phrase 0 points -- 4 points 6 points 2 points  Total Score 0 points  6 points 8 points 9 points    Immunizations Immunization History  Administered Date(s) Administered   Fluad Quad(high Dose 65+) 12/13/2020, 01/10/2022   Fluad Trivalent(High Dose 65+) 04/11/2023   Influenza Split 02/17/2006, 12/30/2006, 01/20/2010   Influenza, High Dose Seasonal PF 12/29/2014, 12/24/2016, 11/29/2017, 12/01/2018   Influenza, Seasonal, Injecte, Preservative Fre 01/24/2011, 01/02/2012, 12/24/2012   Influenza,inj,Quad PF,6+ Mos 12/09/2013   Influenza-Unspecified 12/24/2016, 11/14/2017, 12/17/2019   Moderna Sars-Covid-2 Vaccination 04/14/2019, 05/12/2019, 02/17/2020, 07/19/2020, 01/10/2022   Pneumococcal Conjugate-13 01/25/2014, 03/17/2014   Pneumococcal  Polysaccharide-23 09/23/2006, 12/30/2006   Td 02/26/2005   Zoster, Live 01/24/2011    TDAP status: Due, Education has been provided regarding the importance of this vaccine. Advised may receive this vaccine at local pharmacy or Health Dept. Aware to provide a copy of the vaccination record if obtained from local pharmacy or Health Dept. Verbalized acceptance and understanding.  Flu Vaccine status: Up to date  Pneumococcal vaccine status: Declined,  Education has been provided regarding the importance of this vaccine but patient still declined. Advised may receive this vaccine at local pharmacy or Health Dept. Aware to provide a copy of the vaccination record if obtained from local pharmacy or Health Dept. Verbalized acceptance and understanding.   Covid-19 vaccine status: Completed vaccines  Qualifies for Shingles Vaccine? Yes   Zostavax completed Yes   Shingrix Completed?: No.    Education has been provided regarding the importance of this vaccine. Patient has been advised to call insurance company to determine out of pocket expense if they have not yet received this vaccine. Advised may also  receive vaccine at local pharmacy or Health Dept. Verbalized acceptance and understanding.  Screening Tests Health Maintenance  Topic Date Due   COVID-19 Vaccine (6 - 2024-25 season) 12/02/2022   Zoster Vaccines- Shingrix (1 of 2) 07/10/2023 (Originally 02/09/1979)   DTaP/Tdap/Td (2 - Tdap) 04/10/2024 (Originally 02/27/2015)   Medicare Annual Wellness (AWV)  05/08/2024   Pneumonia Vaccine 25+ Years old  Completed   INFLUENZA VACCINE  Completed   HPV VACCINES  Aged Out    Health Maintenance  Health Maintenance Due  Topic Date Due   COVID-19 Vaccine (6 - 2024-25 season) 12/02/2022    Colorectal cancer screening: No longer required.   Lung Cancer Screening: (Low Dose CT Chest recommended if Age 27-80 years, 20 pack-year currently smoking OR have quit w/in 15years.) does not qualify.     Additional Screening:  Hepatitis C Screening: does not qualify; Completed NO  Vision Screening: Recommended annual ophthalmology exams for early detection of glaucoma and other disorders of the eye. Is the patient up to date with their annual eye exam?  Yes  Who is the provider or what is the name of the office in which the patient attends annual eye exams? DR.BELL If pt is not established with a provider, would they like to be referred to a provider to establish care? No .   Dental Screening: Recommended annual dental exams for proper oral hygiene   Community Resource Referral / Chronic Care Management: CRR required this visit?  No   CCM required this visit?  No     Plan:     I have personally reviewed and noted the following in the patient's chart:   Medical and social history Use of alcohol, tobacco or illicit drugs  Current medications and supplements including opioid prescriptions. Patient is not currently taking opioid prescriptions. Functional ability and status Nutritional status Physical activity Advanced directives List of other physicians Hospitalizations, surgeries, and ER visits in previous 12 months Vitals Screenings to include cognitive, depression, and falls Referrals and appointments  In addition, I have reviewed and discussed with patient certain preventive protocols, quality metrics, and best practice recommendations. A written personalized care plan for preventive services as well as general preventive health recommendations were provided to patient.     Jhonnie GORMAN Das, LPN   10/02/7972   After Visit Summary: (MyChart) Due to this being a telephonic visit, the after visit summary with patients personalized plan was offered to patient via MyChart   Nurse Notes: NONE

## 2023-06-12 ENCOUNTER — Telehealth: Payer: Self-pay

## 2023-06-12 NOTE — Telephone Encounter (Signed)
 Called and made aware. Verbal understanding given.

## 2023-06-12 NOTE — Telephone Encounter (Signed)
 Copied from CRM [id number]. Topic: Clinical - Medication Question >> Jun 12, 2023  9:20 AM Carlatta H wrote: Reason for CRM: Patients son would like a prescription call in for constipation//Patient has not used the bathroom for 2 days//Please advise Jeffery Campbell [phone] once prescription is called in to [facility]

## 2023-06-12 NOTE — Telephone Encounter (Signed)
 Son Jeffery Campbell calling back to ask about this issue.  Jeffery Campbell given information about the Miralax, and verbalized understanding.

## 2023-06-29 ENCOUNTER — Other Ambulatory Visit: Payer: Self-pay | Admitting: Nurse Practitioner

## 2023-06-29 DIAGNOSIS — J329 Chronic sinusitis, unspecified: Secondary | ICD-10-CM

## 2023-06-29 DIAGNOSIS — E782 Mixed hyperlipidemia: Secondary | ICD-10-CM

## 2023-06-30 ENCOUNTER — Other Ambulatory Visit: Payer: Self-pay | Admitting: Nurse Practitioner

## 2023-06-30 DIAGNOSIS — F419 Anxiety disorder, unspecified: Secondary | ICD-10-CM

## 2023-07-02 NOTE — Telephone Encounter (Signed)
 Requested Prescriptions  Pending Prescriptions Disp Refills   busPIRone (BUSPAR) 10 MG tablet [Pharmacy Med Name: BUSPIRONE HCL 10 MG TABLET] 180 tablet 0    Sig: TAKE 1 TABLET (10 MG TOTAL) BY MOUTH 2 (TWO) TIMES DAILY AS NEEDED (FOR ANXIETY).     Psychiatry: Anxiolytics/Hypnotics - Non-controlled Passed - 07/02/2023  2:32 PM      Passed - Valid encounter within last 12 months    Recent Outpatient Visits   None     Future Appointments             In 3 months Zane Herald, Rudolpho Sevin, [name] [facility], [facility]

## 2023-07-02 NOTE — Telephone Encounter (Signed)
 Requested by interface surescripts. Future visit in 3 months.  Requested Prescriptions  Pending Prescriptions Disp Refills   levocetirizine (XYZAL) 5 MG tablet [Pharmacy Med Name: LEVOCETIRIZINE 5 MG TABLET] 90 tablet 1    Sig: TAKE 1 TABLET BY MOUTH EVERY DAY IN THE EVENING     Ear, Nose, and Throat:  Antihistamines - levocetirizine dihydrochloride Failed - 07/02/2023 11:01 AM      Failed - Cr in normal range and within 360 days    Creat  Date Value Ref Range Status  04/11/2023 1.23 (H) 0.70 - 1.22 mg/dL Final         Passed - eGFR is 10 or above and within 360 days    GFR, Est African American  Date Value Ref Range Status  11/12/2019 63 > OR = 60 mL/min/1.45m2 Final   GFR, Est Non African American  Date Value Ref Range Status  11/12/2019 54 (L) > OR = 60 mL/min/1.34m2 Final   GFR, Estimated  Date Value Ref Range Status  02/08/2021 >60 >60 mL/min Final    Comment:    (NOTE) Calculated using the CKD-EPI Creatinine Equation (2021)    eGFR  Date Value Ref Range Status  04/11/2023 55 (L) > OR = 60 mL/min/1.46m2 Final         Passed - Valid encounter within last 12 months    Recent Outpatient Visits   None     Future Appointments             In 3 months Zane Herald, Rudolpho Sevin, [name] [facility], [facility]             atorvastatin (LIPITOR) 20 MG tablet [Pharmacy Med Name: ATORVASTATIN 20 MG TABLET] 90 tablet 1    Sig: TAKE 1 TABLET BY MOUTH EVERYDAY AT BEDTIME     Cardiovascular:  Antilipid - Statins Failed - 07/02/2023 11:01 AM      Failed - Lipid Panel in normal range within the last 12 months    Cholesterol, Total  Date Value Ref Range Status  09/22/2014 134 100 - 199 mg/dL Final   Cholesterol  Date Value Ref Range Status  04/11/2023 115 <200 mg/dL Final   LDL Cholesterol (Calc)  Date Value Ref Range Status  04/11/2023 52 mg/dL (calc) Final    Comment:    Reference range: <100 . Desirable range <100 mg/dL for primary prevention;   <70  mg/dL for patients with CHD or diabetic patients  with > or = 2 CHD risk factors. [name] LDL-C is now calculated using the Madril-Hopkins  calculation, which is a validated novel method providing  better accuracy than the Friedewald equation in the  estimation of LDL-C.  Horald Pollen et al. Lenox Ahr. 1610;960(45): 2061-2068  (http://education.[employer].com/faq/FAQ164)    HDL  Date Value Ref Range Status  04/11/2023 50 > OR = 40 mg/dL Final  40/98/1191 46 >47 mg/dL Final    Comment:    According to ATP-III Guidelines, HDL-C >59 mg/dL is considered a negative risk factor for CHD.    Triglycerides  Date Value Ref Range Status  04/11/2023 52 <150 mg/dL Final         Passed - Patient is not pregnant      Passed - Valid encounter within last 12 months    Recent Outpatient Visits   None     Future Appointments             In 3 months Zane Herald, Rudolpho Sevin, [name] [facility],  [facility]

## 2023-08-15 ENCOUNTER — Emergency Department

## 2023-08-15 ENCOUNTER — Other Ambulatory Visit: Payer: Self-pay

## 2023-08-15 ENCOUNTER — Observation Stay
Admission: EM | Admit: 2023-08-15 | Discharge: 2023-08-16 | Disposition: A | Discharge status: Home / Self Care | Attending: Family Medicine | Admitting: Family Medicine

## 2023-08-15 DIAGNOSIS — F419 Anxiety disorder, unspecified: Secondary | ICD-10-CM | POA: Diagnosis not present

## 2023-08-15 DIAGNOSIS — I129 Hypertensive chronic kidney disease with stage 1 through stage 4 chronic kidney disease, or unspecified chronic kidney disease: Secondary | ICD-10-CM | POA: Diagnosis not present

## 2023-08-15 DIAGNOSIS — Z8673 Personal history of transient ischemic attack (TIA), and cerebral infarction without residual deficits: Secondary | ICD-10-CM

## 2023-08-15 DIAGNOSIS — N400 Enlarged prostate without lower urinary tract symptoms: Secondary | ICD-10-CM | POA: Diagnosis not present

## 2023-08-15 DIAGNOSIS — H8110 Benign paroxysmal vertigo, unspecified ear: Secondary | ICD-10-CM | POA: Diagnosis present

## 2023-08-15 DIAGNOSIS — Z7982 Long term (current) use of aspirin: Secondary | ICD-10-CM | POA: Insufficient documentation

## 2023-08-15 DIAGNOSIS — F32A Depression, unspecified: Secondary | ICD-10-CM | POA: Insufficient documentation

## 2023-08-15 DIAGNOSIS — Z79899 Other long term (current) drug therapy: Secondary | ICD-10-CM | POA: Insufficient documentation

## 2023-08-15 DIAGNOSIS — R55 Syncope and collapse: Principal | ICD-10-CM | POA: Insufficient documentation

## 2023-08-15 DIAGNOSIS — N390 Urinary tract infection, site not specified: Secondary | ICD-10-CM

## 2023-08-15 DIAGNOSIS — I10 Essential (primary) hypertension: Secondary | ICD-10-CM | POA: Diagnosis present

## 2023-08-15 DIAGNOSIS — N189 Chronic kidney disease, unspecified: Secondary | ICD-10-CM | POA: Insufficient documentation

## 2023-08-15 DIAGNOSIS — F418 Other specified anxiety disorders: Secondary | ICD-10-CM | POA: Diagnosis present

## 2023-08-15 DIAGNOSIS — R42 Dizziness and giddiness: Secondary | ICD-10-CM | POA: Diagnosis present

## 2023-08-15 DIAGNOSIS — I639 Cerebral infarction, unspecified: Secondary | ICD-10-CM | POA: Insufficient documentation

## 2023-08-15 LAB — COMPREHENSIVE METABOLIC PANEL WITH GFR
ALT: 14 U/L (ref 0–44)
AST: 19 U/L (ref 15–41)
Albumin: 3.8 g/dL (ref 3.5–5.0)
Alkaline Phosphatase: 38 U/L (ref 38–126)
Anion gap: 11 (ref 5–15)
BUN: 15 mg/dL (ref 8–23)
CO2: 21 mmol/L — ABNORMAL LOW (ref 22–32)
Calcium: 9 mg/dL (ref 8.9–10.3)
Chloride: 104 mmol/L (ref 98–111)
Creatinine, Ser: 1.07 mg/dL (ref 0.61–1.24)
GFR, Estimated: >60 mL/min (ref >60)
Glucose, Bld: 112 mg/dL — ABNORMAL HIGH (ref 70–99)
Potassium: 3.6 mmol/L (ref 3.5–5.1)
Sodium: 136 mmol/L (ref 135–145)
Total Bilirubin: 1.9 mg/dL — ABNORMAL HIGH (ref 0.0–1.2)
Total Protein: 6.8 g/dL (ref 6.5–8.1)

## 2023-08-15 LAB — URINALYSIS, ROUTINE W REFLEX MICROSCOPIC
Bilirubin Urine: NEGATIVE
Glucose, UA: NEGATIVE mg/dL
Ketones, ur: NEGATIVE mg/dL
Nitrite: NEGATIVE
Protein, ur: 30 mg/dL — AB
Specific Gravity, Urine: 1.015 (ref 1.005–1.030)
WBC, UA: >50 WBC/hpf (ref 0–5)
pH: 6 (ref 5.0–8.0)

## 2023-08-15 LAB — CBC
HCT: 45.9 % (ref 39.0–52.0)
Hemoglobin: 16.2 g/dL (ref 13.0–17.0)
MCH: 30.9 pg (ref 26.0–34.0)
MCHC: 35.3 g/dL (ref 30.0–36.0)
MCV: 87.4 fL (ref 80.0–100.0)
Platelets: 177 10*3/uL (ref 150–400)
RBC: 5.25 MIL/uL (ref 4.22–5.81)
RDW: 12.5 % (ref 11.5–15.5)
WBC: 5.9 10*3/uL (ref 4.0–10.5)
nRBC: 0 % (ref 0.0–0.2)

## 2023-08-15 LAB — TROPONIN I (HIGH SENSITIVITY): Troponin I (High Sensitivity): 11 ng/L (ref <18)

## 2023-08-15 MED ORDER — SODIUM CHLORIDE 0.9 % IV SOLN
1.0000 g | Freq: Once | INTRAVENOUS | Status: AC
  Administered 2023-08-16: 1 g via INTRAVENOUS
  Filled 2023-08-15: qty 10

## 2023-08-15 NOTE — ED Triage Notes (Signed)
 Patient reports dizziness since lunch time; had neighbor call 911. Upon arrival and en route, EMS reports hypertensive.    BP 203/107

## 2023-08-15 NOTE — ED Provider Notes (Signed)
 [facility] Provider Note    Event Date/Time   First MD Initiated Contact with Patient 08/15/23 1932     (approximate)   History   Dizziness   HPI  Jeffery Campbell is a 89 y.o. male with a history of hypertension, GERD, CKD, hyperlipidemia, prior CVA,, obesity, anxiety, and depression who presents with lightheadedness and altered mental status.  The patient states that he has been feeling dizzy all day, described as lightheaded or like he might pass out, rather than any vertigo or spinning sensation.  His son reports that he has been lightheaded and weak, and also somewhat more confused than baseline.  Normally he is alert and oriented and able to drive.  The son states that the patient has not been able to get up as much today, and has also been talking "out of his head" about irrelevant things or old stories.  This altered mentation has been coming and going throughout the day.  The patient otherwise denies any acute symptoms.  He has no pain.  He denies any nausea or vomiting.  He denies shortness of breath.  He has no fever or chills.  He denies any urinary symptoms.  I reviewed the past medical records.  The patient's most recent outpatient encounter in our system was on 2/6 with family medicine for an annual exam.  He has no recent ED visits or hospitalizations.   Physical Exam   Triage Vital Signs: ED Triage Vitals [08/15/23 1751]  Encounter Vitals Group     BP (!) 165/77     Systolic BP Percentile      Diastolic BP Percentile      Pulse Rate 60     Resp 20     Temp 97.8 F (36.6 C)     Temp Source Oral     SpO2 96 %     Weight      Height      Head Circumference      Peak Flow      Pain Score 0     Pain Loc      Pain Education      Exclude from Growth Chart     Most recent vital signs: Vitals:   08/15/23 1751 08/15/23 2100  BP: (!) 165/77 (!) 160/72  Pulse: 60 60  Resp: 20 19  Temp: 97.8 F (36.6 C)   SpO2: 96% 95%      General: Alert and oriented, no distress.  CV:  Good peripheral perfusion.  Resp:  Normal effort.  Lungs CTAB. Abd:  No distention.  Other:  EOMI.  PERRLA.  No photophobia.  No facial droop.  Normal speech.  Motor intact in all extremities.  No ataxia.  No pronator drift.   ED Results / Procedures / Treatments   Labs (all labs ordered are listed, but only abnormal results are displayed) Labs Reviewed  COMPREHENSIVE METABOLIC PANEL WITH GFR - Abnormal; Notable for the following components:      Result Value   CO2 21 (*)    Glucose, Bld 112 (*)    Total Bilirubin 1.9 (*)    All other components within normal limits  URINALYSIS, ROUTINE W REFLEX MICROSCOPIC - Abnormal; Notable for the following components:   Color, Urine YELLOW (*)    APPearance CLOUDY (*)    Hgb urine dipstick SMALL (*)    Protein, ur 30 (*)    Leukocytes,Ua MODERATE (*)    Bacteria, UA FEW (*)  All other components within normal limits  CBC  CBG MONITORING, ED  TROPONIN I (HIGH SENSITIVITY)  TROPONIN I (HIGH SENSITIVITY)     EKG  ED ECG REPORT I, Lind Repine, the attending physician, personally viewed and interpreted this ECG.  Date: 08/15/2023 EKG Time: 1759 Rate: 58 Rhythm: normal sinus rhythm QRS Axis: Left axis Intervals: normal ST/T Wave abnormalities: normal Narrative Interpretation: no evidence of acute ischemia    RADIOLOGY  CT head: I independently viewed and interpreted the images; there is no ICH.  Radiology report indicates no acute abnormality  PROCEDURES:  Critical Care performed: No  Procedures   MEDICATIONS ORDERED IN ED: Medications  cefTRIAXone (ROCEPHIN) 1 g in sodium chloride  0.9 % 100 mL IVPB (has no administration in time range)     IMPRESSION / MDM / ASSESSMENT AND PLAN / ED COURSE  I reviewed the triage vital signs and the nursing notes.  89 year old male with PMH as noted above presents with lightheadedness/near syncope over the last several  hours, as well as intermittent confusion/delirium noted by his son.  On exam the patient is alert and oriented.  He is hypertensive with otherwise normal vital signs.  Thorough neurologic exam is nonfocal.  Differential diagnosis includes, but is not limited to, dehydration, electrolyte abnormality, uremia, other metabolic cause, UTI or other infection, CVA, TIA, or other CNS etiology, less likely cardiac cause.  We will obtain basic labs, cardiac enzymes, urinalysis, CT head, and reassess.  Patient's presentation is most consistent with acute presentation with potential threat to life or bodily function.  The patient is on the cardiac monitor to evaluate for evidence of arrhythmia and/or significant heart rate changes.   ----------------------------------------- 11:53 PM on 08/15/2023 -----------------------------------------  CT is negative for acute findings.  Lab workup overall is unremarkable.  Troponin is negative.  CBC and CMP show no acute findings.  Urinalysis does show greater than 50 WBCs and some bacteria, concerning for possible UTI.  I have ordered IV ceftriaxone.  Given his change in mental status, the patient will benefit from inpatient mission for further monitoring.  I consulted Dr. Vallarie Gauze from the hospitalist service; based on our discussion she agrees to evaluate the patient for admission.   FINAL CLINICAL IMPRESSION(S) / ED DIAGNOSES   Final diagnoses:  Near syncope     Rx / DC Orders   ED Discharge Orders     None        Note:  This document was prepared using Dragon voice recognition software and may include unintentional dictation errors.    Lind Repine, MD 08/15/23 2355

## 2023-08-15 NOTE — ED Notes (Signed)
 Pt transported to CT ?

## 2023-08-16 ENCOUNTER — Observation Stay
Admit: 2023-08-16 | Discharge: 2023-08-16 | Disposition: A | Discharge status: Home / Self Care | Attending: Internal Medicine | Admitting: Internal Medicine

## 2023-08-16 ENCOUNTER — Encounter: Payer: Self-pay | Admitting: Radiology

## 2023-08-16 ENCOUNTER — Emergency Department

## 2023-08-16 DIAGNOSIS — N390 Urinary tract infection, site not specified: Secondary | ICD-10-CM

## 2023-08-16 DIAGNOSIS — R42 Dizziness and giddiness: Secondary | ICD-10-CM | POA: Diagnosis not present

## 2023-08-16 DIAGNOSIS — F418 Other specified anxiety disorders: Secondary | ICD-10-CM

## 2023-08-16 DIAGNOSIS — I10 Essential (primary) hypertension: Secondary | ICD-10-CM

## 2023-08-16 DIAGNOSIS — N401 Enlarged prostate with lower urinary tract symptoms: Secondary | ICD-10-CM

## 2023-08-16 DIAGNOSIS — H8110 Benign paroxysmal vertigo, unspecified ear: Secondary | ICD-10-CM

## 2023-08-16 DIAGNOSIS — Z8673 Personal history of transient ischemic attack (TIA), and cerebral infarction without residual deficits: Secondary | ICD-10-CM

## 2023-08-16 LAB — CBG MONITORING, ED: Glucose-Capillary: 91 mg/dL (ref 70–99)

## 2023-08-16 MED ORDER — FINASTERIDE 5 MG PO TABS
5.0000 mg | ORAL_TABLET | Freq: Every day | ORAL | Status: DC
Start: 2023-08-16 — End: 2023-08-16
  Administered 2023-08-16: 5 mg via ORAL
  Filled 2023-08-16: qty 1

## 2023-08-16 MED ORDER — BUSPIRONE HCL 5 MG PO TABS: 10.0000 mg | ORAL_TABLET | Freq: Two times a day (BID) | ORAL | Status: DC | PRN

## 2023-08-16 MED ORDER — ACETAMINOPHEN 325 MG PO TABS: 650.0000 mg | ORAL_TABLET | Freq: Four times a day (QID) | ORAL | Status: DC | PRN

## 2023-08-16 MED ORDER — DILTIAZEM HCL ER COATED BEADS 120 MG PO CP24
120.0000 mg | ORAL_CAPSULE | Freq: Every day | ORAL | Status: DC
  Administered 2023-08-16: 120 mg via ORAL
  Filled 2023-08-16: qty 1

## 2023-08-16 MED ORDER — CEPHALEXIN 500 MG PO CAPS
500.0000 mg | ORAL_CAPSULE | Freq: Two times a day (BID) | ORAL | Status: DC
  Administered 2023-08-16: 500 mg via ORAL
  Filled 2023-08-16: qty 1

## 2023-08-16 MED ORDER — ONDANSETRON HCL 4 MG/2ML IJ SOLN: 4.0000 mg | Freq: Four times a day (QID) | INTRAMUSCULAR | Status: DC | PRN

## 2023-08-16 MED ORDER — ACETAMINOPHEN 650 MG RE SUPP: 650.0000 mg | Freq: Four times a day (QID) | RECTAL | Status: DC | PRN

## 2023-08-16 MED ORDER — ATORVASTATIN CALCIUM 20 MG PO TABS
20.0000 mg | ORAL_TABLET | Freq: Every day | ORAL | Status: DC
  Administered 2023-08-16: 20 mg via ORAL
  Filled 2023-08-16: qty 1

## 2023-08-16 MED ORDER — DILTIAZEM HCL 60 MG PO TABS: 60.0000 mg | ORAL_TABLET | Freq: Every day | ORAL | 0 refills | Status: DC

## 2023-08-16 MED ORDER — ENOXAPARIN SODIUM 40 MG/0.4ML IJ SOSY
40.0000 mg | PREFILLED_SYRINGE | INTRAMUSCULAR | Status: DC
  Administered 2023-08-16: 40 mg via SUBCUTANEOUS
  Filled 2023-08-16: qty 0.4

## 2023-08-16 MED ORDER — ALFUZOSIN HCL ER 10 MG PO TB24
10.0000 mg | ORAL_TABLET | Freq: Every day | ORAL | Status: DC
Start: 2023-08-16 — End: 2023-08-16
  Administered 2023-08-16: 10 mg via ORAL
  Filled 2023-08-16: qty 1

## 2023-08-16 MED ORDER — SODIUM CHLORIDE 0.9% FLUSH
3.0000 mL | Freq: Two times a day (BID) | INTRAVENOUS | Status: DC
Start: 2023-08-16 — End: 2023-08-16
  Administered 2023-08-16 (×2): 3 mL via INTRAVENOUS

## 2023-08-16 MED ORDER — ONDANSETRON HCL 4 MG PO TABS: 4.0000 mg | ORAL_TABLET | Freq: Four times a day (QID) | ORAL | Status: DC | PRN

## 2023-08-16 MED ORDER — MECLIZINE HCL 25 MG PO TABS: 12.5000 mg | ORAL_TABLET | Freq: Every day | ORAL | Status: DC | PRN

## 2023-08-16 MED ORDER — ASPIRIN 81 MG PO TBEC
81.0000 mg | DELAYED_RELEASE_TABLET | Freq: Every day | ORAL | Status: DC
  Administered 2023-08-16: 81 mg via ORAL
  Filled 2023-08-16: qty 1

## 2023-08-16 MED ORDER — CETIRIZINE HCL 10 MG PO TABS
10.0000 mg | ORAL_TABLET | Freq: Every evening | ORAL | Status: DC
  Filled 2023-08-16: qty 1

## 2023-08-16 NOTE — Discharge Summary (Signed)
 Physician Discharge Summary   Patient: Jeffery Campbell MRN: [id number] DOB: July 18, 1928  Admit date:     08/15/2023  Discharge date: 08/16/23  Discharge Physician: Roise Cleaver   PCP: Quinton Buckler, [name]   Recommendations at discharge:   Follow-up with primary care physician for blood pressure management.  Diltiazem  has been discontinued during this admission due to persistent bradycardia causing dizziness   discharge Diagnoses: Principal Problem:   Dizziness Active Problems:   Benign paroxysmal positional nystagmus   Urinary tract infection   History of CVA (cerebrovascular accident)   Essential hypertension   Anxiety with depression   BPH (benign prostatic hyperplasia)  Resolved Problems:   * No resolved hospital problems. *  Hospital Course: Jeffery Campbell is a 89 year old male with history of thalamic stroke in 2016, anxiety, depression, BPPV on meclizine , who presents to the ED for evaluation of dizziness. Patient reports has had a prolonged dizzy spell, which he attributes to his diltiazem .  At baseline patient lives independently but his son has accompanied him to the ED.  In the ED blood pressure 165/77, pulse 50s.  Troponin 11, labs unremarkable.  Urinalysis with moderate leukocytes. MRI brain revealed nonacute findings but did show multiple old left cerebral infarcts and old right thalamic small vessel infarct as well as chronic small vessel ischemic disease and volume loss.  Patient was initiated on Rocephin for possible UTI but ultimately patient denied any lower urinary tract symptoms and antibiotics were discontinued.  By reevaluation on 5/16 patient reported feeling back to baseline and was anxious to discharge home.   He was monitored on telemetry and had no acute events.  He was persistently bradycardic into the 50s.  EKG confirmed sinus bradycardia.  When discussing his home medications he reports he takes 2 diltiazem , though on chart review it appears he is only meant to  be once daily dose.  It is unclear if he has been taking additional doses of diltiazem , but given his persistent bradycardia it does appear that current dose is too strong for him.  To avoid abrupt discontinuation and side effects we will proceed with 60 mg dosing at discharge.  He will need close outpatient follow-up with his PCP for blood pressure monitoring.   Orthostatic vitals were negative. Patient was given strict ER return precautions and endorses understanding.  Son was also at bedside for our discussion and endorses understanding our recommendations. Patient demonstrated he was easily able to walk around the ED without dizziness prior to discharge  Dizziness - resolved.  Bradycardia - Patient has been seen by [facility] cardiology for bradycardia in the past - Previously worn Holter monitor and that revealed frequent PVCs. - Echocardiogram 2021 showed normal LV systolic function, mildly dilated aortic root, mild mitral regurg - He was treated with diltiazem  for improvement in PVC burden. - Given bradycardia here, will decrease his diltiazem  dose to 60 mg daily.  May require complete discontinuation outpatient. - Orthostatics negative. EKG sinus without PVCs here.  - No events on telemetry - Outpatient follow-up for further workup if recurs - ER return precautions  BPH  - Stable. continue home meds  UTI ruled out - UA without nitrites, no lower urinary tract symptoms.  Antibiotics discontinued  History of CVA - Patient reports he has discontinued aspirin  outpatient  Hypertension - Only on diltiazem .  Concern patient is taking double dose of medication.  Have recommended complete discontinuation given bradycardia  Anxiety Depression - Resume home meds  BPPV - No dizziness on evaluation  5/16.  Resume as needed meclizine    Diet recommendation:  Discharge Diet Orders (From admission, onward)     Start     Ordered   08/16/23 0000  Diet general        08/16/23 1407            Discharge Instructions     Call MD for:  difficulty breathing, headache or visual disturbances   Complete by: As directed    Call MD for:  persistant dizziness or light-headedness   Complete by: As directed    Call MD for:  persistant nausea and vomiting   Complete by: As directed    Call MD for:  severe uncontrolled pain   Complete by: As directed    Call MD for:  temperature >100.4   Complete by: As directed    Diet general   Complete by: As directed    Discharge instructions   Complete by: As directed    While you were admitted your blood pressure was normal but your pulse was very low.  This is likely contributing to your dizziness.  I recommend you stop taking diltiazem .  Keep a blood pressure log and follow-up with your primary care physician.  If your blood pressure is elevated there are alternative blood pressure medications which may do a better job controlling your blood pressure without lowering your heart rate.  If you begin experiencing dizziness, passout, chest pain, please call 911 and return to the ED immediately   Increase activity slowly   Complete by: As directed        DISCHARGE MEDICATION: Allergies as of 08/16/2023   No Known Allergies      Medication List     STOP taking these medications    diltiazem  120 MG 24 hr capsule Commonly known as: CARDIZEM  CD   fluticasone  50 MCG/ACT nasal spray Commonly known as: FLONASE    Systane Balance 0.6 % Soln Generic drug: Propylene Glycol       TAKE these medications    acetaminophen  325 MG tablet Commonly known as: TYLENOL  Take 325-650 mg by mouth every 6 (six) hours as needed for moderate pain or headache.   alfuzosin  10 MG 24 hr tablet Commonly known as: UROXATRAL  Take 1 tablet (10 mg total) by mouth daily with breakfast.   atorvastatin  20 MG tablet Commonly known as: LIPITOR TAKE 1 TABLET BY MOUTH EVERYDAY AT BEDTIME   busPIRone  10 MG tablet Commonly known as: BUSPAR  TAKE 1 TABLET (10 MG  TOTAL) BY MOUTH 2 (TWO) TIMES DAILY AS NEEDED (FOR ANXIETY).   diltiazem  60 MG tablet Commonly known as: Cardizem  Take 1 tablet (60 mg total) by mouth daily.   finasteride  5 MG tablet Commonly known as: PROSCAR  Take 1 tablet (5 mg total) by mouth daily.   Fish Oil  1200 MG Caps Take 2 capsules (2,400 mg total) by mouth in the morning and at bedtime.   levocetirizine 5 MG tablet Commonly known as: XYZAL  TAKE 1 TABLET BY MOUTH EVERY DAY IN THE EVENING   meclizine  12.5 MG tablet Commonly known as: ANTIVERT  Take 1 tablet (12.5 mg total) by mouth daily as needed for dizziness (vertigo episodes). Use sparingly.  Must get appointment if using daily or symptoms worsen   melatonin 5 MG Tabs Take 5 mg by mouth at bedtime.   Prevagen 10 MG Caps Generic drug: Apoaequorin Take 10 capsules by mouth daily.   Superior Prostate Tabs Take 250 tablets by mouth daily.  Discharge Exam: There were no vitals filed for this visit. Constitutional:  Normal appearance. Non toxic-appearing.  HENT: Head Normocephalic and atraumatic.  Mucous membranes are moist.  Eyes:  Extraocular intact. Conjunctivae normal. Pupils are equal, round, and reactive to light.  Cardiovascular: Rate and Rhythm: Normal rate and regular rhythm.  Pulmonary: Non labored, symmetric rise of chest wall.  Musculoskeletal:  Normal range of motion.  Skin: warm and dry. not jaundiced.  Neurological: No focal deficit present. alert. Oriented. Psychiatric: Mood and Affect congruent.    Condition at discharge: stable  The results of significant diagnostics from this hospitalization (including imaging, microbiology, ancillary and laboratory) are listed below for reference.   Imaging Studies: MR BRAIN WO CONTRAST Result Date: 08/16/2023 CLINICAL DATA:  Altered mental status EXAM: MRI HEAD WITHOUT CONTRAST TECHNIQUE: Multiplanar, multiecho pulse sequences of the brain and surrounding structures were obtained without  intravenous contrast. COMPARISON:  03/21/2017 FINDINGS: Brain: No acute infarct, mass effect or extra-axial collection. No acute or chronic hemorrhage. There is multifocal hyperintense T2-weighted signal within the white matter. Generalized volume loss. The midline structures are normal. Old right thalamic small vessel infarct. Multiple old left cerebellar infarcts. Vascular: Normal flow voids. Skull and upper cervical spine: Normal calvarium and skull base. Visualized upper cervical spine and soft tissues are normal. Sinuses/Orbits:No paranasal sinus fluid levels or advanced mucosal thickening. No mastoid or middle ear effusion. Normal orbits. IMPRESSION: 1. No acute intracranial abnormality. 2. Multiple old left cerebellar infarcts and old right thalamic small vessel infarct. 3. Findings of chronic small vessel ischemia and volume loss. Electronically Signed   By: Juanetta Nordmann M.D.   On: 08/16/2023 02:09   CT Head Wo Contrast Result Date: 08/15/2023 CLINICAL DATA:  Hypertension, with mental status changes, unknown cause. EXAM: CT HEAD WITHOUT CONTRAST TECHNIQUE: Contiguous axial images were obtained from the base of the skull through the vertex without intravenous contrast. RADIATION DOSE REDUCTION: This exam was performed according to the departmental dose-optimization program which includes automated exposure control, adjustment of the mA and/or kV according to patient size and/or use of iterative reconstruction technique. COMPARISON:  Head CT 01/27/2018. FINDINGS: Brain: There is mild-to-moderate global atrophy, with atrophic ventriculomegaly and mild to moderate small vessel disease of the cerebral white matter. Findings appear age-commensurate. 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 No midline shift. Basal cisterns are clear. There is a chronic right thalamocapsular lacunar infarct. No acute cortical based infarct, hemorrhage, mass or mass effect are seen. Vascular: There are calcific plaques in both distal  vertebral arteries and siphons. No hyperdense central vasculature. Skull: Negative for fractures or focal lesions. Sinuses/Orbits: Mild membrane disease in the ethmoid air cells and maxillary sinuses. Other visible sinuses, bilateral mastoid air cells, middle ears are clear. Negative orbits apart from old lens replacements. Nasal septum deviates left with left-sided spurring. Other: None. IMPRESSION: 1. No acute intracranial CT findings. 2. Atrophy and small-vessel disease.  Stable exam. 3. Sinus membrane disease. Electronically Signed   By: Denman Fischer M.D.   On: 08/15/2023 21:03    Microbiology: Results for orders placed or performed in visit on 09/20/21  Urine Culture     Status: Abnormal   Collection Time: 09/20/21  2:00 PM   Specimen: Urine  Result Value Ref Range Status   MICRO NUMBER: [id number]  Final   SPECIMEN QUALITY: Adequate  Final   Sample Source URINE  Final   STATUS: FINAL  Final   ISOLATE 1: Citrobacter freundii (A)  Final    Comment:  Greater than 100,000 CFU/mL of Citrobacter freundii      Susceptibility   Citrobacter freundii - URINE CULTURE, REFLEX    AMOX/CLAVULANIC 4 Resistant     CEFAZOLIN* >=64 Resistant      * For uncomplicated UTI caused by E. coli, K. pneumoniae or P. mirabilis: Cefazolin is susceptible if MIC <32 mcg/mL and predicts susceptible to the oral agents cefaclor, cefdinir, cefpodoxime, cefprozil, cefuroxime, cephalexin  and loracarbef.     CEFTAZIDIME <=1 Sensitive     CEFEPIME <=1 Sensitive     CEFTRIAXONE <=1 Sensitive     CIPROFLOXACIN  <=0.25 Sensitive     LEVOFLOXACIN <=0.12 Sensitive     GENTAMICIN <=1 Sensitive     IMIPENEM <=0.25 Sensitive     NITROFURANTOIN  <=16 Sensitive     PIP/TAZO <=4 Sensitive     TOBRAMYCIN <=1 Sensitive     TRIMETH/SULFA* <=20 Sensitive      * For uncomplicated UTI caused by E. coli, K. pneumoniae or P. mirabilis: Cefazolin is susceptible if MIC <32 mcg/mL and predicts susceptible to the oral agents  cefaclor, cefdinir, cefpodoxime, cefprozil, cefuroxime, cephalexin  and loracarbef. Legend: S = Susceptible  I = Intermediate R = Resistant  NS = Not susceptible * = Not tested  NR = Not reported **NN = See antimicrobic comments     Labs: CBC: Recent Labs  Lab 08/15/23 1755  WBC 5.9  HGB 16.2  HCT 45.9  MCV 87.4  PLT 177   Basic Metabolic Panel: Recent Labs  Lab 08/15/23 1755  NA 136  K 3.6  CL 104  CO2 21*  GLUCOSE 112*  BUN 15  CREATININE 1.07  CALCIUM  9.0   Liver Function Tests: Recent Labs  Lab 08/15/23 1755  AST 19  ALT 14  ALKPHOS 38  BILITOT 1.9*  PROT 6.8  ALBUMIN 3.8   CBG: Recent Labs  Lab 08/16/23 0419  GLUCAP 91    Discharge time spent: 32 minutes.  Signed: Cacey Willow, DO [facility] 08/16/2023

## 2023-08-16 NOTE — Hospital Course (Signed)
 Jeffery Campbell

## 2023-08-16 NOTE — Assessment & Plan Note (Signed)
 History of BPPV History of CVA MRI negative for acute CVA but showing multiple old left cerebellar infarcts PT OT consult Meclizine  as needed

## 2023-08-16 NOTE — Assessment & Plan Note (Signed)
 Continue diltiazem.

## 2023-08-16 NOTE — ED Notes (Signed)
 Meds provided by pt's son- diltiazem  120 mg Alfuzosin  5 mg Finasteride  5 mg Buspirone  10 mg Meclizine  25 mg Atorvastatin  20 mg Levocitirazine 5 mg Pt reports he takes everything daily but states the dilt is a new med and he is unsure if he takes his bc he was told his BP was high.

## 2023-08-16 NOTE — Assessment & Plan Note (Signed)
 Continue buspirone

## 2023-08-16 NOTE — Hospital Course (Addendum)
 Jeffery Campbell is a 89 year old male with history of thalamic stroke in 2016, anxiety, depression, BPPV on meclizine , who presents to the ED for evaluation of dizziness. Patient reports has had a prolonged dizzy spell, which he attributes to his diltiazem .  At baseline patient lives independently but his son has accompanied him to the ED.  In the ED blood pressure 165/77, pulse 50s.  Troponin 11, labs unremarkable.  Urinalysis with moderate leukocytes. MRI brain revealed nonacute findings but did show multiple old left cerebral infarcts and old right thalamic small vessel infarct as well as chronic small vessel ischemic disease and volume loss.  Patient was initiated on Rocephin for possible UTI but ultimately patient denied any lower urinary tract symptoms and antibiotics were discontinued.  By reevaluation on 5/16 patient reported feeling back to baseline and was anxious to discharge home.   He was monitored on telemetry and had no acute events.  He was persistently bradycardic into the 50s.  EKG confirmed sinus bradycardia.  When discussing his home medications he reports he takes 2 diltiazem , though on chart review it appears he is only meant to be once daily dose.  It is unclear if he has been taking additional doses of diltiazem , but given his persistent bradycardia it does appear that current dose is too strong for him.  To avoid abrupt discontinuation and side effects we will proceed with 60 mg dosing at discharge.  He will need close outpatient follow-up with his PCP for blood pressure monitoring.   Orthostatic vitals were negative. Patient was given strict ER return precautions and endorses understanding.  Son was also at bedside for our discussion and endorses understanding our recommendations. Patient demonstrated he was easily able to walk around the ED without dizziness prior to discharge

## 2023-08-16 NOTE — Progress Notes (Signed)
 Attempted Echocardiogram, could not locate patient and later learned patient was discharged.

## 2023-08-16 NOTE — ED Notes (Signed)
 Dr. Para March at bedside

## 2023-08-16 NOTE — Assessment & Plan Note (Signed)
 Received Rocephin in the ED Follow culture Will continue with Keflex 

## 2023-08-16 NOTE — ED Notes (Signed)
 Pt transported to MRI

## 2023-08-16 NOTE — Assessment & Plan Note (Signed)
 Continue Proscar  and Uroxatrol

## 2023-08-16 NOTE — H&P (Signed)
 History and Physical    Patient: Jeffery Campbell WNU:[id number] DOB: February 19, 1929 DOA: 08/15/2023 DOS: the patient was seen and examined on 08/16/2023 PCP: Quinton Buckler, [name]  Patient coming from: Home  Chief Complaint:  Chief Complaint  Patient presents with   Dizziness    HPI: Jeffery Campbell is a 89 y.o. male with medical history significant for Thalamic stroke in 2016, anxiety and depression BPPV treated with meclizine  who presents to the ED for evaluation of dizziness.  States he was started on a new BP med a couple days prior and on the day of arrival the episode he had a prolonged dizzy spell that was different from his usual spell.  It was more intense and long-lasting.   At baseline patient lives independently and still drives.  Son at bedside contributes to the history and states that his father does not seem to be a bit confused throughout the day.   ED course and data review: BP 165/77, pulse 53-60 with otherwise normal vitals Troponin 11, CMP and CBC unremarkable Urinalysis with moderate leukocytes  EKG, personally reviewed and interpreted showing sinus bradycardia at 58  MRI brain nonacute but showing multiple old left cerebellar infarcts and old right thalamic small vessel infarct as well as chronic small vessel ischemia and volume loss  Patient started on Rocephin for possible UTI  Hospitalist consulted for admission     Past Medical History:  Diagnosis Date   Arrhythmia    Cough    CHRONIC   Depression    Disturbance of sleep    Diverticulitis    GERD (gastroesophageal reflux disease)    Hemorrhoids    Hypertension    OA (osteoarthritis)    Pure hypercholesterolemia    Sleep apnea    Stroke [facility])    Past Surgical History:  Procedure Laterality Date   CATARACT EXTRACTION W/PHACO Left 02/14/2016   Procedure: CATARACT EXTRACTION PHACO AND INTRAOCULAR LENS PLACEMENT (IOC);  Surgeon: Clair Crews, MD;  Location: [facility] ORS;  Service: Ophthalmology;  Laterality:  Left;  US  53.7 AP% 21.5 CDE 11.58 FLUID PACK LOT # [id number]   CATARACT EXTRACTION W/PHACO Right 12/18/2018   Procedure: CATARACT EXTRACTION PHACO AND INTRAOCULAR LENS PLACEMENT (IOC)  RIGHT, VISION BLUE;  Surgeon: Ola Berger, MD;  Location: [facility] ORS;  Service: Ophthalmology;  Laterality: Right;  US   01:14 CDE 11.82 Fluid pack lot # [id number]   COLONOSCOPY     KNEE ARTHROSCOPY     TRANSURETHRAL RESECTION OF PROSTATE     Social History:  reports that he has never smoked. He has never used smokeless tobacco. He reports that he does not drink alcohol and does not use drugs.  No Known Allergies  Family History  Problem Relation Age of Onset   Hypertension Son        Damaris Duhamel of parents health.   Prostate cancer Neg Hx    Bladder Cancer Neg Hx    Kidney cancer Neg Hx     Prior to Admission medications   Medication Sig Start Date End Date Taking? Authorizing Provider  acetaminophen  (TYLENOL ) 325 MG tablet Take 325-650 mg by mouth every 6 (six) hours as needed for moderate pain or headache.    [provider]  alfuzosin  (UROXATRAL ) 10 MG 24 hr tablet Take 1 tablet (10 mg total) by mouth daily with breakfast. 11/29/22   [name], Danne Dustman, PA-C  aspirin  81 MG EC tablet Take 1 tablet (81 mg total) by mouth daily. Swallow whole. 01/31/21   Tapia, Leisa,  PA-C  atorvastatin  (LIPITOR) 20 MG tablet TAKE 1 TABLET BY MOUTH EVERYDAY AT BEDTIME 07/02/23   Pender, Julie F, [name]  busPIRone  (BUSPAR ) 10 MG tablet TAKE 1 TABLET (10 MG TOTAL) BY MOUTH 2 (TWO) TIMES DAILY AS NEEDED (FOR ANXIETY). 07/02/23   Quinton Buckler, [name]  diltiazem  (CARDIZEM  CD) 120 MG 24 hr capsule TAKE 1 CAPSULE BY MOUTH EVERY DAY 04/11/23   Pender, Julie F, [name]  finasteride  (PROSCAR ) 5 MG tablet Take 1 tablet (5 mg total) by mouth daily. 11/29/22   Matilde Son A, PA-C  fluticasone  (FLONASE ) 50 MCG/ACT nasal spray PLACE 2 SPRAYS INTO BOTH NOSTRILS DAILY AS NEEDED FOR ALLERGIES OR RHINITIS. 11/09/21   Pender, Julie F, [name]   levocetirizine (XYZAL ) 5 MG tablet TAKE 1 TABLET BY MOUTH EVERY DAY IN THE EVENING 07/02/23   Pender, Julie F, [name]  meclizine  (ANTIVERT ) 12.5 MG tablet Take 1 tablet (12.5 mg total) by mouth daily as needed for dizziness (vertigo episodes). Use sparingly.  Must get appointment if using daily or symptoms worsen 01/31/21   Tapia, Leisa, PA-C  Omega-3 Fatty Acids (FISH OIL ) 1200 MG CAPS Take 2 capsules (2,400 mg total) by mouth in the morning and at bedtime. 01/31/21   Tapia, Leisa, PA-C  Propylene Glycol (SYSTANE BALANCE) 0.6 % SOLN Place 1 drop into both eyes 3 (three) times daily as needed (dry eyes).    [provider]    Physical Exam: Vitals:   08/15/23 1751 08/15/23 2100 08/15/23 2330 08/16/23 0100  BP: (!) 165/77 (!) 160/72 (!) 142/69 133/87  Pulse: 60 60 (!) 53 (!) 54  Resp: 20 19 (!) 24 15  Temp: 97.8 F (36.6 C)     TempSrc: Oral     SpO2: 96% 95% 91% 93%   Physical Exam Vitals and nursing note reviewed.  Constitutional:      General: He is not in acute distress. HENT:     Head: Normocephalic and atraumatic.  Cardiovascular:     Rate and Rhythm: Normal rate and regular rhythm.     Heart sounds: Normal heart sounds.  Pulmonary:     Effort: Pulmonary effort is normal.     Breath sounds: Normal breath sounds.  Abdominal:     Palpations: Abdomen is soft.     Tenderness: There is no abdominal tenderness.  Neurological:     Mental Status: Mental status is at baseline.     Labs on Admission: I have personally reviewed following labs and imaging studies  CBC: Recent Labs  Lab 08/15/23 1755  WBC 5.9  HGB 16.2  HCT 45.9  MCV 87.4  PLT 177   Basic Metabolic Panel: Recent Labs  Lab 08/15/23 1755  NA 136  K 3.6  CL 104  CO2 21*  GLUCOSE 112*  BUN 15  CREATININE 1.07  CALCIUM  9.0   GFR: CrCl cannot be calculated (Unknown ideal weight.). Liver Function Tests: Recent Labs  Lab 08/15/23 1755  AST 19  ALT 14  ALKPHOS 38  BILITOT 1.9*  PROT 6.8   ALBUMIN 3.8   No results for input(s): "LIPASE", "AMYLASE" in the last 168 hours. No results for input(s): "AMMONIA" in the last 168 hours. Coagulation Profile: No results for input(s): "INR", "PROTIME" in the last 168 hours. Cardiac Enzymes: No results for input(s): "CKTOTAL", "CKMB", "CKMBINDEX", "TROPONINI" in the last 168 hours. BNP (last 3 results) No results for input(s): "PROBNP" in the last 8760 hours. HbA1C: No results for input(s): "HGBA1C" in the last 72 hours.  CBG: No results for input(s): "GLUCAP" in the last 168 hours. Lipid Profile: No results for input(s): "CHOL", "HDL", "LDLCALC", "TRIG", "CHOLHDL", "LDLDIRECT" in the last 72 hours. Thyroid  Function Tests: No results for input(s): "TSH", "T4TOTAL", "FREET4", "T3FREE", "THYROIDAB" in the last 72 hours. Anemia Panel: No results for input(s): "VITAMINB12", "FOLATE", "FERRITIN", "TIBC", "IRON", "RETICCTPCT" in the last 72 hours. Urine analysis:    Component Value Date/Time   COLORURINE YELLOW (A) 08/15/2023 2058   APPEARANCEUR CLOUDY (A) 08/15/2023 2058   APPEARANCEUR Clear 01/23/2018 1512   LABSPEC 1.015 08/15/2023 2058   PHURINE 6.0 08/15/2023 2058   GLUCOSEU NEGATIVE 08/15/2023 2058   HGBUR SMALL (A) 08/15/2023 2058   BILIRUBINUR NEGATIVE 08/15/2023 2058   BILIRUBINUR negative 09/20/2021 1343   BILIRUBINUR Negative 01/23/2018 1512   KETONESUR NEGATIVE 08/15/2023 2058   PROTEINUR 30 (A) 08/15/2023 2058   UROBILINOGEN 0.2 09/20/2021 1343   UROBILINOGEN 1.0 08/19/2014 0255   NITRITE NEGATIVE 08/15/2023 2058   LEUKOCYTESUR MODERATE (A) 08/15/2023 2058    Radiological Exams on Admission: MR BRAIN WO CONTRAST Result Date: 08/16/2023 CLINICAL DATA:  Altered mental status EXAM: MRI HEAD WITHOUT CONTRAST TECHNIQUE: Multiplanar, multiecho pulse sequences of the brain and surrounding structures were obtained without intravenous contrast. COMPARISON:  03/21/2017 FINDINGS: Brain: No acute infarct, mass effect or  extra-axial collection. No acute or chronic hemorrhage. There is multifocal hyperintense T2-weighted signal within the white matter. Generalized volume loss. The midline structures are normal. Old right thalamic small vessel infarct. Multiple old left cerebellar infarcts. Vascular: Normal flow voids. Skull and upper cervical spine: Normal calvarium and skull base. Visualized upper cervical spine and soft tissues are normal. Sinuses/Orbits:No paranasal sinus fluid levels or advanced mucosal thickening. No mastoid or middle ear effusion. Normal orbits. IMPRESSION: 1. No acute intracranial abnormality. 2. Multiple old left cerebellar infarcts and old right thalamic small vessel infarct. 3. Findings of chronic small vessel ischemia and volume loss. Electronically Signed   By: Juanetta Nordmann M.D.   On: 08/16/2023 02:09   CT Head Wo Contrast Result Date: 08/15/2023 CLINICAL DATA:  Hypertension, with mental status changes, unknown cause. EXAM: CT HEAD WITHOUT CONTRAST TECHNIQUE: Contiguous axial images were obtained from the base of the skull through the vertex without intravenous contrast. RADIATION DOSE REDUCTION: This exam was performed according to the departmental dose-optimization program which includes automated exposure control, adjustment of the mA and/or kV according to patient size and/or use of iterative reconstruction technique. COMPARISON:  Head CT 01/27/2018. FINDINGS: Brain: There is mild-to-moderate global atrophy, with atrophic ventriculomegaly and mild to moderate small vessel disease of the cerebral white matter. Findings appear age-commensurate. 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 No midline shift. Basal cisterns are clear. There is a chronic right thalamocapsular lacunar infarct. No acute cortical based infarct, hemorrhage, mass or mass effect are seen. Vascular: There are calcific plaques in both distal vertebral arteries and siphons. No hyperdense central vasculature. Skull: Negative for fractures or  focal lesions. Sinuses/Orbits: Mild membrane disease in the ethmoid air cells and maxillary sinuses. Other visible sinuses, bilateral mastoid air cells, middle ears are clear. Negative orbits apart from old lens replacements. Nasal septum deviates left with left-sided spurring. Other: None. IMPRESSION: 1. No acute intracranial CT findings. 2. Atrophy and small-vessel disease.  Stable exam. 3. Sinus membrane disease. Electronically Signed   By: Denman Fischer M.D.   On: 08/15/2023 21:03   Data Reviewed for HPI: Relevant notes from primary care and specialist visits, past discharge summaries as available in EHR, including  Care Everywhere. Prior diagnostic testing as pertinent to current admission diagnoses Updated medications and problem lists for reconciliation ED course, including vitals, labs, imaging, treatment and response to treatment Triage notes, nursing and pharmacy notes and ED provider's notes Notable results as noted above in HPI      Assessment and Plan: * Dizziness History of BPPV History of CVA MRI negative for acute CVA but showing multiple old left cerebellar infarcts PT OT consult Meclizine  as needed  Urinary tract infection Received Rocephin in the ED Follow culture Will continue with Keflex   BPH (benign prostatic hyperplasia) Continue Proscar  and Uroxatrol  Anxiety with depression Continue buspirone   Essential hypertension Continue diltiazem      DVT prophylaxis: Lovenox   Consults: none  Advance Care Planning:   Code Status: Prior   Family Communication: son at bedside  Disposition Plan: Back to previous home environment  Severity of Illness: The appropriate patient status for this patient is OBSERVATION. Observation status is judged to be reasonable and necessary in order to provide the required intensity of service to ensure the patient's safety. The patient's presenting symptoms, physical exam findings, and initial radiographic and laboratory data  in the context of their medical condition is felt to place them at decreased risk for further clinical deterioration. Furthermore, it is anticipated that the patient will be medically stable for discharge from the hospital within 2 midnights of admission.   Author: Lanetta Pion, MD 08/16/2023 2:50 AM  For on call review [email or web address].

## 2023-08-16 NOTE — ED Notes (Signed)
 Per Reeta Canon RN, pt has been refusing to leave monitoring cables on. Tele made aware.

## 2023-08-16 NOTE — ED Notes (Signed)
 Pt returned from MRI

## 2023-08-16 NOTE — ED Notes (Signed)
 CCMD called to initiate cardiac monitoring

## 2023-08-20 NOTE — Progress Notes (Signed)
 BP 122/72   Pulse 69   Temp 97.9 F (36.6 C)   Ht 6\' 1"  (1.854 m)   Wt 211 lb 4.8 oz (95.8 kg)   SpO2 96%   BMI 27.88 kg/m    Subjective:    Patient ID: Jeffery Campbell, male    DOB: 1928/10/31, 89 y.o.   MRN: [id number]  HPI: Jeffery Campbell is a 89 y.o. male  Chief Complaint  Patient presents with   Hospitalization Follow-up   Medication Refill    Wants meclizine  refilled due to dizziness sometimes    Discussed the use of AI scribe software for clinical note transcription with the patient, who gave verbal consent to proceed.  History of Present Illness Jeffery Campbell is a 89 year old male who presents for a hospital follow-up after being admitted for dizziness.  He was admitted to the hospital on Aug 15, 2023, and discharged on Aug 16, 2023, due to dizziness. During his hospital stay, his diltiazem  dosage was reduced from 120 mg daily to 60 mg daily. A CT scan showed atrophy and small vessel disease, while an MRI revealed multiple old left cerebral infarcts and an old right thalamic small vessel infarct, with no new or acute findings. Lab work was unremarkable.  Since discharge, he feels better and has been walking around his yard, although he now uses a cane. No dizziness today.     Recommendations at discharge:    Follow-up with primary care physician for blood pressure management.  Diltiazem  has been discontinued during this admission due to persistent bradycardia causing dizziness   discharge Diagnoses: Principal Problem:   Dizziness Active Problems:   Benign paroxysmal positional nystagmus   Urinary tract infection   History of CVA (cerebrovascular accident)   Essential hypertension   Anxiety with depression   BPH (benign prostatic hyperplasia)   Resolved Problems:   * No resolved hospital problems. *   Hospital Course: Jeffery Campbell is a 89 year old male with history of thalamic stroke in 2016, anxiety, depression, BPPV on meclizine , who presents to the ED for  evaluation of dizziness. Patient reports has had a prolonged dizzy spell, which he attributes to his diltiazem .  At baseline patient lives independently but his son has accompanied him to the ED.  In the ED blood pressure 165/77, pulse 50s.  Troponin 11, labs unremarkable.  Urinalysis with moderate leukocytes. MRI brain revealed nonacute findings but did show multiple old left cerebral infarcts and old right thalamic small vessel infarct as well as chronic small vessel ischemic disease and volume loss.  Patient was initiated on Rocephin  for possible UTI but ultimately patient denied any lower urinary tract symptoms and antibiotics were discontinued.  By reevaluation on 5/16 patient reported feeling back to baseline and was anxious to discharge home.   He was monitored on telemetry and had no acute events.  He was persistently bradycardic into the 50s.  EKG confirmed sinus bradycardia.  When discussing his home medications he reports he takes 2 diltiazem , though on chart review it appears he is only meant to be once daily dose.  It is unclear if he has been taking additional doses of diltiazem , but given his persistent bradycardia it does appear that current dose is too strong for him.  To avoid abrupt discontinuation and side effects we will proceed with 60 mg dosing at discharge.  He will need close outpatient follow-up with his PCP for blood pressure monitoring.   Orthostatic vitals were negative. Patient was given strict ER  return precautions and endorses understanding.  Son was also at bedside for our discussion and endorses understanding our recommendations. Patient demonstrated he was easily able to walk around the ED without dizziness prior to discharge   Dizziness - resolved.   Bradycardia - Patient has been seen by [facility] cardiology for bradycardia in the past - Previously worn Holter monitor and that revealed frequent PVCs. - Echocardiogram 2021 showed normal LV systolic function, mildly dilated  aortic root, mild mitral regurg - He was treated with diltiazem  for improvement in PVC burden. - Given bradycardia here, will decrease his diltiazem  dose to 60 mg daily.  May require complete discontinuation outpatient. - Orthostatics negative. EKG sinus without PVCs here.  - No events on telemetry - Outpatient follow-up for further workup if recurs - ER return precautions   BPH  - Stable. continue home meds   UTI ruled out - UA without nitrites, no lower urinary tract symptoms.  Antibiotics discontinued   History of CVA - Patient reports he has discontinued aspirin  outpatient   Hypertension - Only on diltiazem .  Concern patient is taking double dose of medication.  Have recommended complete discontinuation given bradycardia   Anxiety Depression - Resume home meds   BPPV - No dizziness on evaluation 5/16.  Resume as needed meclizine    Medication List       STOP taking these medications     diltiazem  120 MG 24 hr capsule Commonly known as: CARDIZEM  CD    fluticasone  50 MCG/ACT nasal spray Commonly known as: FLONASE     Systane Balance 0.6 % Soln Generic drug: Propylene Glycol           TAKE these medications     acetaminophen  325 MG tablet Commonly known as: TYLENOL  Take 325-650 mg by mouth every 6 (six) hours as needed for moderate pain or headache.    alfuzosin  10 MG 24 hr tablet Commonly known as: UROXATRAL  Take 1 tablet (10 mg total) by mouth daily with breakfast.    atorvastatin  20 MG tablet Commonly known as: LIPITOR TAKE 1 TABLET BY MOUTH EVERYDAY AT BEDTIME    busPIRone  10 MG tablet Commonly known as: BUSPAR  TAKE 1 TABLET (10 MG TOTAL) BY MOUTH 2 (TWO) TIMES DAILY AS NEEDED (FOR ANXIETY).    diltiazem  60 MG tablet Commonly known as: Cardizem  Take 1 tablet (60 mg total) by mouth daily.    finasteride  5 MG tablet Commonly known as: PROSCAR  Take 1 tablet (5 mg total) by mouth daily.    Fish Oil  1200 MG Caps Take 2 capsules (2,400 mg total) by  mouth in the morning and at bedtime.    levocetirizine 5 MG tablet Commonly known as: XYZAL  TAKE 1 TABLET BY MOUTH EVERY DAY IN THE EVENING    meclizine  12.5 MG tablet Commonly known as: ANTIVERT  Take 1 tablet (12.5 mg total) by mouth daily as needed for dizziness (vertigo episodes). Use sparingly.  Must get appointment if using daily or symptoms worsen    melatonin 5 MG Tabs Take 5 mg by mouth at bedtime.    Prevagen 10 MG Caps Generic drug: Apoaequorin Take 10 capsules by mouth daily.    Superior Prostate Tabs Take 250 tablets by mouth daily.    CT HEAD WITHOUT CONTRAST   TECHNIQUE: Contiguous axial images were obtained from the base of the skull through the vertex without intravenous contrast.   RADIATION DOSE REDUCTION: This exam was performed according to the departmental dose-optimization program which includes automated exposure control, adjustment of the  mA and/or kV according to patient size and/or use of iterative reconstruction technique.   COMPARISON:  Head CT 01/27/2018.   FINDINGS: Brain: There is mild-to-moderate global atrophy, with atrophic ventriculomegaly and mild to moderate small vessel disease of the cerebral white matter.   Findings appear age-commensurate. 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 No midline shift. Basal cisterns are clear.   There is a chronic right thalamocapsular lacunar infarct. No acute cortical based infarct, hemorrhage, mass or mass effect are seen.   Vascular: There are calcific plaques in both distal vertebral arteries and siphons. No hyperdense central vasculature.   Skull: Negative for fractures or focal lesions.   Sinuses/Orbits: Mild membrane disease in the ethmoid air cells and maxillary sinuses.   Other visible sinuses, bilateral mastoid air cells, middle ears are clear.   Negative orbits apart from old lens replacements. Nasal septum deviates left with left-sided spurring.   Other: None.   IMPRESSION: 1. No  acute intracranial CT findings. 2. Atrophy and small-vessel disease.  Stable exam. 3. Sinus membrane disease.  EXAM: MRI HEAD WITHOUT CONTRAST   TECHNIQUE: Multiplanar, multiecho pulse sequences of the brain and surrounding structures were obtained without intravenous contrast.   COMPARISON:  03/21/2017   FINDINGS: Brain: No acute infarct, mass effect or extra-axial collection. No acute or chronic hemorrhage. There is multifocal hyperintense T2-weighted signal within the white matter. Generalized volume loss. The midline structures are normal. Old right thalamic small vessel infarct. Multiple old left cerebellar infarcts.   Vascular: Normal flow voids.   Skull and upper cervical spine: Normal calvarium and skull base. Visualized upper cervical spine and soft tissues are normal.   Sinuses/Orbits:No paranasal sinus fluid levels or advanced mucosal thickening. No mastoid or middle ear effusion. Normal orbits.   IMPRESSION: 1. No acute intracranial abnormality. 2. Multiple old left cerebellar infarcts and old right thalamic small vessel infarct. 3. Findings of chronic small vessel ischemia and volume loss.    08/21/2023    3:21 PM 05/09/2023    8:53 AM 04/11/2023    9:35 AM  Depression screen PHQ 2/9  Decreased Interest 0 0 0  Down, Depressed, Hopeless 0 0 0  PHQ - 2 Score 0 0 0  Altered sleeping 0 0 1  Tired, decreased energy 0 0 1  Change in appetite 0 0 0  Feeling bad or failure about yourself  0 0 0  Trouble concentrating 0 0 0  Moving slowly or fidgety/restless 0 0 1  Suicidal thoughts 0 0 0  PHQ-9 Score 0 0 3  Difficult doing work/chores Not difficult at all Not difficult at all Not difficult at all    Relevant past medical, surgical, family and social history reviewed and updated as indicated. Interim medical history since our last visit reviewed. Allergies and medications reviewed and updated.  Review of Systems  Constitutional: Negative for fever or weight  change.  Respiratory: Negative for cough and shortness of breath.   Cardiovascular: Negative for chest pain or palpitations.  Gastrointestinal: Negative for abdominal pain, no bowel changes.  Musculoskeletal: Negative for gait problem or joint swelling.  Skin: Negative for rash.  Neurological: Negative for dizziness or headache.  No other specific complaints in a complete review of systems (except as listed in HPI above).      Objective:      BP 122/72   Pulse 69   Temp 97.9 F (36.6 C)   Ht 6\' 1"  (1.854 m)   Wt 211 lb 4.8 oz (95.8 kg)  SpO2 96%   BMI 27.88 kg/m    Wt Readings from Last 3 Encounters:  08/21/23 211 lb 4.8 oz (95.8 kg)  04/11/23 220 lb 6.4 oz (100 kg)  11/29/22 218 lb 11.2 oz (99.2 kg)    Physical Exam Vitals reviewed.  Constitutional:      Appearance: Normal appearance.  HENT:     Head: Normocephalic.  Cardiovascular:     Rate and Rhythm: Normal rate and regular rhythm.  Pulmonary:     Effort: Pulmonary effort is normal.     Breath sounds: Normal breath sounds.  Musculoskeletal:        General: Normal range of motion.  Skin:    General: Skin is warm and dry.  Neurological:     General: No focal deficit present.     Mental Status: He is alert and oriented to person, place, and time. Mental status is at baseline.  Psychiatric:        Mood and Affect: Mood normal.        Behavior: Behavior normal.        Thought Content: Thought content normal.        Judgment: Judgment normal.      Results for orders placed or performed during the hospital encounter of 08/15/23  Comprehensive metabolic panel   Collection Time: 08/15/23  5:55 PM  Result Value Ref Range   Sodium 136 135 - 145 mmol/L   Potassium 3.6 3.5 - 5.1 mmol/L   Chloride 104 98 - 111 mmol/L   CO2 21 (L) 22 - 32 mmol/L   Glucose, Bld 112 (H) 70 - 99 mg/dL   BUN 15 8 - 23 mg/dL   Creatinine, Ser 9.60 0.61 - 1.24 mg/dL   Calcium  9.0 8.9 - 10.3 mg/dL   Total Protein 6.8 6.5 - 8.1 g/dL    Albumin 3.8 3.5 - 5.0 g/dL   AST 19 15 - 41 U/L   ALT 14 0 - 44 U/L   Alkaline Phosphatase 38 38 - 126 U/L   Total Bilirubin 1.9 (H) 0.0 - 1.2 mg/dL   GFR, Estimated >45 >40 mL/min   Anion gap 11 5 - 15  CBC   Collection Time: 08/15/23  5:55 PM  Result Value Ref Range   WBC 5.9 4.0 - 10.5 K/uL   RBC 5.25 4.22 - 5.81 MIL/uL   Hemoglobin 16.2 13.0 - 17.0 g/dL   HCT 98.1 19.1 - 47.8 %   MCV 87.4 80.0 - 100.0 fL   MCH 30.9 26.0 - 34.0 pg   MCHC 35.3 30.0 - 36.0 g/dL   RDW 29.5 62.1 - 30.8 %   Platelets 177 150 - 400 K/uL   nRBC 0.0 0.0 - 0.2 %  Urinalysis, Routine w reflex microscopic -Urine, Clean Catch   Collection Time: 08/15/23  8:58 PM  Result Value Ref Range   Color, Urine YELLOW (A) YELLOW   APPearance CLOUDY (A) CLEAR   Specific Gravity, Urine 1.015 1.005 - 1.030   pH 6.0 5.0 - 8.0   Glucose, UA NEGATIVE NEGATIVE mg/dL   Hgb urine dipstick SMALL (A) NEGATIVE   Bilirubin Urine NEGATIVE NEGATIVE   Ketones, ur NEGATIVE NEGATIVE mg/dL   Protein, ur 30 (A) NEGATIVE mg/dL   Nitrite NEGATIVE NEGATIVE   Leukocytes,Ua MODERATE (A) NEGATIVE   RBC / HPF 6-10 0 - 5 RBC/hpf   WBC, UA >50 0 - 5 WBC/hpf   Bacteria, UA FEW (A) NONE SEEN   Squamous Epithelial / HPF 6-10 0 -  5 /HPF   WBC Clumps PRESENT    Mucus PRESENT    Amorphous Crystal PRESENT   Troponin I (High Sensitivity)   Collection Time: 08/15/23  8:58 PM  Result Value Ref Range   Troponin I (High Sensitivity) 11 <18 ng/L  CBG monitoring, ED   Collection Time: 08/16/23  4:19 AM  Result Value Ref Range   Glucose-Capillary 91 70 - 99 mg/dL          Assessment & Plan:   Problem List Items Addressed This Visit       Cardiovascular and Mediastinum   Essential hypertension (Chronic)   Relevant Medications   diltiazem  (CARDIZEM ) 60 MG tablet     Nervous and Auditory   Benign paroxysmal positional nystagmus     Other   Dizziness   Relevant Medications   meclizine  (ANTIVERT ) 12.5 MG tablet   Other Visit  Diagnoses       Hospital discharge follow-up    -  Primary        Assessment and Plan Assessment & Plan Dizziness/BPPV Dizziness was the reason for recent hospital admission. Symptoms have improved since discharge with no dizziness reported today. His diltiazem  was decreased to 60 mg daily and he is doing well on this dosage.  -last saw neurology in 2020  HTN Blood pressure is well-controlled on the reduced dose of diltiazem . - Continue diltiazem  60 mg daily.          Follow up plan: Return for has appointment scheduled.

## 2023-08-21 ENCOUNTER — Ambulatory Visit: Admitting: Nurse Practitioner

## 2023-08-21 ENCOUNTER — Encounter: Payer: Self-pay | Admitting: Nurse Practitioner

## 2023-08-21 VITALS — BP 122/72 | HR 69 | Temp 97.9°F | Ht 73.0 in | Wt 211.3 lb

## 2023-08-21 DIAGNOSIS — R42 Dizziness and giddiness: Secondary | ICD-10-CM

## 2023-08-21 DIAGNOSIS — H8110 Benign paroxysmal vertigo, unspecified ear: Secondary | ICD-10-CM | POA: Diagnosis not present

## 2023-08-21 DIAGNOSIS — I10 Essential (primary) hypertension: Secondary | ICD-10-CM | POA: Diagnosis not present

## 2023-08-21 DIAGNOSIS — Z09 Encounter for follow-up examination after completed treatment for conditions other than malignant neoplasm: Secondary | ICD-10-CM | POA: Diagnosis not present

## 2023-08-21 MED ORDER — DILTIAZEM HCL 60 MG PO TABS: 60.0000 mg | ORAL_TABLET | Freq: Every day | ORAL | 1 refills | Status: DC

## 2023-08-21 MED ORDER — MECLIZINE HCL 12.5 MG PO TABS: 12.5000 mg | ORAL_TABLET | Freq: Every day | ORAL | 0 refills | Status: DC | PRN

## 2023-09-16 ENCOUNTER — Other Ambulatory Visit: Payer: Self-pay | Admitting: Nurse Practitioner

## 2023-09-16 DIAGNOSIS — I10 Essential (primary) hypertension: Secondary | ICD-10-CM

## 2023-09-16 NOTE — Telephone Encounter (Signed)
 Copied from CRM [id number]. Topic: Clinical - Medication Refill >> Sep 16, 2023  8:07 AM Georgeann Kindred wrote: Medication: diltiazem  (CARDIZEM ) 60 MG tablet  Has the patient contacted their pharmacy? Yes (Agent: If no, request that the patient contact the pharmacy for the refill. If patient does not wish to contact the pharmacy document the reason why and proceed with request.) (Agent: If yes, when and what did the pharmacy advise?) No refills   This is the patient's preferred pharmacy:  [facility] [address] [place] - [address] [place] [address] Phone: [phone] Fax: [phone]  Is this the correct pharmacy for this prescription? Yes If no, delete pharmacy and type the correct one.   Has the prescription been filled recently? No  Is the patient out of the medication? Yes  Has the patient been seen for an appointment in the last year OR does the patient have an upcoming appointment? Yes  Can we respond through MyChart? No  Agent: Please be advised that Rx refills may take up to 3 business days. We ask that you follow-up with your pharmacy.

## 2023-09-18 ENCOUNTER — Other Ambulatory Visit: Payer: Self-pay | Admitting: Nurse Practitioner

## 2023-09-18 DIAGNOSIS — F419 Anxiety disorder, unspecified: Secondary | ICD-10-CM

## 2023-09-18 DIAGNOSIS — I10 Essential (primary) hypertension: Secondary | ICD-10-CM

## 2023-09-18 DIAGNOSIS — R42 Dizziness and giddiness: Secondary | ICD-10-CM

## 2023-09-18 NOTE — Telephone Encounter (Signed)
 Too soon for refill, refilled 08/25/23 for 90 days.  Requested Prescriptions  Pending Prescriptions Disp Refills   diltiazem  (CARDIZEM ) 60 MG tablet 90 tablet 1    Sig: Take 1 tablet (60 mg total) by mouth daily.     Cardiovascular: Calcium  Channel Blockers 3 Passed - 09/18/2023 10:50 AM      Passed - ALT in normal range and within 360 days    ALT  Date Value Ref Range Status  08/15/2023 14 0 - 44 U/L Final         Passed - AST in normal range and within 360 days    AST  Date Value Ref Range Status  08/15/2023 19 15 - 41 U/L Final         Passed - Cr in normal range and within 360 days    Creat  Date Value Ref Range Status  04/11/2023 1.23 (H) 0.70 - 1.22 mg/dL Final   Creatinine, Ser  Date Value Ref Range Status  08/15/2023 1.07 0.61 - 1.24 mg/dL Final         Passed - Last BP in normal range    BP Readings from Last 1 Encounters:  08/21/23 122/72         Passed - Last Heart Rate in normal range    Pulse Readings from Last 1 Encounters:  08/21/23 69         Passed - Valid encounter within last 6 months    Recent Outpatient Visits           4 weeks ago Dizziness   [facility] Quinton Buckler, [name]       Future Appointments             In 3 weeks Abram Hoguet, Monalisa Angles, [name] [facility], [facility]

## 2023-09-19 ENCOUNTER — Telehealth: Payer: Self-pay

## 2023-09-19 NOTE — Telephone Encounter (Signed)
 Copied from CRM [id number]. Topic: General - Call Back - No Documentation >> Sep 19, 2023 12:37 PM Minus Amel G wrote: Reason for CRM: patient would like to know if a nurse can come to his house? Please callback [phone])

## 2023-09-20 NOTE — Telephone Encounter (Signed)
 Requested medication (s) are due for refill today: no  Requested medication (s) are on the active medication list: yes  Last refill:  08/21/23 #90  Future visit scheduled: yes  Notes to clinic:  med not delegated to NT to RF   Requested Prescriptions  Pending Prescriptions Disp Refills   meclizine  (ANTIVERT ) 12.5 MG tablet [Pharmacy Med Name: MECLIZINE  12.5 MG TABLET] 90 tablet     Sig: TAKE 1 TABLET (12.5 MG TOTAL) BY MOUTH DAILY AS NEEDED FOR DIZZINESS (VERTIGO EPISODES). USE SPARINGLY. MUST GET APPOINTMENT IF USING DAILY OR SYMPTOMS WORSEN     Not Delegated - Gastroenterology: Antiemetics Failed - 09/20/2023 12:46 PM      Failed - This refill cannot be delegated      Passed - Valid encounter within last 6 months    Recent Outpatient Visits           1 month ago Dizziness   [facility] Quinton Buckler, [name]       Future Appointments             In 2 weeks Abram Hoguet Monalisa Angles, [name] [facility], [facility]            Signed Prescriptions Disp Refills   busPIRone  (BUSPAR ) 10 MG tablet 180 tablet 0    Sig: TAKE 1 TABLET (10 MG TOTAL) BY MOUTH 2 (TWO) TIMES DAILY AS NEEDED (FOR ANXIETY).     Psychiatry: Anxiolytics/Hypnotics - Non-controlled Passed - 09/20/2023 12:46 PM      Passed - Valid encounter within last 12 months    Recent Outpatient Visits           1 month ago Dizziness   [facility] Quinton Buckler, [name]       Future Appointments             In 2 weeks Abram Hoguet, Monalisa Angles, [name] [facility], [facility]            Refused Prescriptions Disp Refills   diltiazem  (CARDIZEM  CD) 120 MG 24 hr capsule [Pharmacy Med Name: DILTIAZEM  24H ER(CD) 120 MG CP] 90 capsule     Sig: TAKE 1 CAPSULE BY MOUTH EVERY DAY     Cardiovascular: Calcium  Channel Blockers 3 Passed - 09/20/2023 12:46 PM      Passed - ALT in normal range and within 360 days    ALT  Date Value Ref Range Status   08/15/2023 14 0 - 44 U/L Final         Passed - AST in normal range and within 360 days    AST  Date Value Ref Range Status  08/15/2023 19 15 - 41 U/L Final         Passed - Cr in normal range and within 360 days    Creat  Date Value Ref Range Status  04/11/2023 1.23 (H) 0.70 - 1.22 mg/dL Final   Creatinine, Ser  Date Value Ref Range Status  08/15/2023 1.07 0.61 - 1.24 mg/dL Final         Passed - Last BP in normal range    BP Readings from Last 1 Encounters:  08/21/23 122/72         Passed - Last Heart Rate in normal range    Pulse Readings from Last 1 Encounters:  08/21/23 69         Passed - Valid encounter within last 6 months    Recent Outpatient  Visits           1 month ago Dizziness   [facility] Quinton Buckler, [name]       Future Appointments             In 2 weeks Abram Hoguet, Monalisa Angles, [name] [facility], [facility]

## 2023-09-20 NOTE — Telephone Encounter (Signed)
 Called patient states he is good

## 2023-09-20 NOTE — Telephone Encounter (Signed)
 Requested Prescriptions  Pending Prescriptions Disp Refills   busPIRone  (BUSPAR ) 10 MG tablet [Pharmacy Med Name: BUSPIRONE  HCL 10 MG TABLET] 180 tablet 0    Sig: TAKE 1 TABLET (10 MG TOTAL) BY MOUTH 2 (TWO) TIMES DAILY AS NEEDED (FOR ANXIETY).     Psychiatry: Anxiolytics/Hypnotics - Non-controlled Passed - 09/20/2023 12:45 PM      Passed - Valid encounter within last 12 months    Recent Outpatient Visits           1 month ago Dizziness   [facility] Quinton Buckler, [name]       Future Appointments             In 2 weeks Abram Hoguet, Monalisa Angles, [name] [facility], [facility]             meclizine  (ANTIVERT ) 12.5 MG tablet [Pharmacy Med Name: MECLIZINE  12.5 MG TABLET] 90 tablet     Sig: TAKE 1 TABLET (12.5 MG TOTAL) BY MOUTH DAILY AS NEEDED FOR DIZZINESS (VERTIGO EPISODES). USE SPARINGLY. MUST GET APPOINTMENT IF USING DAILY OR SYMPTOMS WORSEN     Not Delegated - Gastroenterology: Antiemetics Failed - 09/20/2023 12:45 PM      Failed - This refill cannot be delegated      Passed - Valid encounter within last 6 months    Recent Outpatient Visits           1 month ago Dizziness   [facility] Quinton Buckler, [name]       Future Appointments             In 2 weeks Abram Hoguet, Monalisa Angles, [name] [facility], [facility]            Refused Prescriptions Disp Refills   diltiazem  (CARDIZEM  CD) 120 MG 24 hr capsule [Pharmacy Med Name: DILTIAZEM  24H ER(CD) 120 MG CP] 90 capsule     Sig: TAKE 1 CAPSULE BY MOUTH EVERY DAY     Cardiovascular: Calcium  Channel Blockers 3 Passed - 09/20/2023 12:45 PM      Passed - ALT in normal range and within 360 days    ALT  Date Value Ref Range Status  08/15/2023 14 0 - 44 U/L Final         Passed - AST in normal range and within 360 days    AST  Date Value Ref Range Status  08/15/2023 19 15 - 41 U/L Final         Passed - Cr in normal range and within 360 days     Creat  Date Value Ref Range Status  04/11/2023 1.23 (H) 0.70 - 1.22 mg/dL Final   Creatinine, Ser  Date Value Ref Range Status  08/15/2023 1.07 0.61 - 1.24 mg/dL Final         Passed - Last BP in normal range    BP Readings from Last 1 Encounters:  08/21/23 122/72         Passed - Last Heart Rate in normal range    Pulse Readings from Last 1 Encounters:  08/21/23 69         Passed - Valid encounter within last 6 months    Recent Outpatient Visits           1 month ago Dizziness   [facility] Quinton Buckler, [name]       Future Appointments  In 2 weeks Abram Hoguet, Monalisa Angles, [name] [facility], [facility]

## 2023-10-07 ENCOUNTER — Other Ambulatory Visit: Payer: Self-pay

## 2023-10-07 ENCOUNTER — Emergency Department
Admission: EM | Admit: 2023-10-07 | Discharge: 2023-10-07 | Disposition: A | Discharge status: Home / Self Care | Attending: Emergency Medicine | Admitting: Emergency Medicine

## 2023-10-07 ENCOUNTER — Emergency Department

## 2023-10-07 DIAGNOSIS — R42 Dizziness and giddiness: Secondary | ICD-10-CM | POA: Diagnosis present

## 2023-10-07 LAB — CBC WITH DIFFERENTIAL/PLATELET
Abs Immature Granulocytes: 0.02 K/uL (ref 0.00–0.07)
Basophils Absolute: 0 K/uL (ref 0.0–0.1)
Basophils Relative: 1 %
Eosinophils Absolute: 0 K/uL (ref 0.0–0.5)
Eosinophils Relative: 1 %
HCT: 43.6 % (ref 39.0–52.0)
Hemoglobin: 14.8 g/dL (ref 13.0–17.0)
Immature Granulocytes: 0 %
Lymphocytes Relative: 10 %
Lymphs Abs: 0.6 K/uL — ABNORMAL LOW (ref 0.7–4.0)
MCH: 30.5 pg (ref 26.0–34.0)
MCHC: 33.9 g/dL (ref 30.0–36.0)
MCV: 89.7 fL (ref 80.0–100.0)
Monocytes Absolute: 0.4 K/uL (ref 0.1–1.0)
Monocytes Relative: 7 %
Neutro Abs: 4.9 K/uL (ref 1.7–7.7)
Neutrophils Relative %: 81 %
Platelets: 169 K/uL (ref 150–400)
RBC: 4.86 MIL/uL (ref 4.22–5.81)
RDW: 12.2 % (ref 11.5–15.5)
WBC: 6 K/uL (ref 4.0–10.5)
nRBC: 0 % (ref 0.0–0.2)

## 2023-10-07 LAB — BASIC METABOLIC PANEL WITH GFR
Anion gap: 8 (ref 5–15)
BUN: 16 mg/dL (ref 8–23)
CO2: 24 mmol/L (ref 22–32)
Calcium: 8.8 mg/dL — ABNORMAL LOW (ref 8.9–10.3)
Chloride: 106 mmol/L (ref 98–111)
Creatinine, Ser: 1.03 mg/dL (ref 0.61–1.24)
GFR, Estimated: >60 mL/min (ref >60)
Glucose, Bld: 115 mg/dL — ABNORMAL HIGH (ref 70–99)
Potassium: 3.7 mmol/L (ref 3.5–5.1)
Sodium: 138 mmol/L (ref 135–145)

## 2023-10-07 NOTE — ED Notes (Signed)
 Pt discharged to ED circle at this time and left with all belongings. Pt ABCs intact. RR even and unlabored. Pt in NAD. Pt denies further needs from this RN.

## 2023-10-07 NOTE — ED Notes (Signed)
 Pt to CT

## 2023-10-07 NOTE — Discharge Instructions (Signed)
 CT imaging and blood work was reassuring today.  Please and follow-up with primary care provider.  If he has any worsening or prolonged symptoms, please return for evaluation.  A CT of the head does not always rule out any evidence of strokes although he is asymptomatic at this time.  Please follow-up with your primary care provider in the next couple of days.

## 2023-10-07 NOTE — ED Notes (Signed)
 Pt ambulated with walker. Pt stable on feet and no complaints of dizziness. Pt ABCs intact. RR even and unlabored. Pt in NAD. Bed in lowest locked position. Call bell in reach. Denies needs at this time.

## 2023-10-07 NOTE — ED Triage Notes (Signed)
 Pt BIB [facility] for sudden onset of dizziness roughly 2 hours ago, followed by nausea and vomiting. 20g PIV placed and 4mg  Zofran  given with EMS. Pt currently feeling drowsy.  Pt BG 89. VSS aside from hypertension to 180s systolic. Occasional PVC.   Past Medical History:  Diagnosis Date   Arrhythmia    Cough    CHRONIC   Depression    Disturbance of sleep    Diverticulitis    GERD (gastroesophageal reflux disease)    Hemorrhoids    Hypertension    OA (osteoarthritis)    Pure hypercholesterolemia    Sleep apnea    Stroke (HCC)

## 2023-10-08 NOTE — ED Provider Notes (Signed)
 [facility] Provider Note    Event Date/Time   First MD Initiated Contact with Patient 10/07/23 1952     (approximate)   History   Dizziness   HPI Jeffery Campbell is a 89 y.o. male with history of orthostatic hypotension and dizziness presenting today for dizzy episode.  Also has prior history of CVA.  Patient reports he had a brief episode of dizziness at home which prompted EMS call.  With EMS he had 1 episode of nausea with vomiting.  On arrival here he states he is completely asymptomatic at this time.  States this feels similar to his regular dizziness and he had just not taken his normal home medications.  Denies any ongoing room spinning sensation, numbness, weakness, speech changes, vision changes.  Denies any other acute symptoms at this time.  Collateral history obtained by son at the bedside agrees with history of similar symptoms.     Physical Exam   Triage Vital Signs: ED Triage Vitals  Encounter Vitals Group     BP --      Girls Systolic BP Percentile --      Girls Diastolic BP Percentile --      Boys Systolic BP Percentile --      Boys Diastolic BP Percentile --      Pulse --      Resp --      Temp --      Temp src --      SpO2 --      Weight 10/07/23 2105 211 lb 3.2 oz (95.8 kg)     Height 10/07/23 2105 6' 1 (1.854 m)     Head Circumference --      Peak Flow --      Pain Score 10/07/23 1959 0     Pain Loc --      Pain Education --      Exclude from Growth Chart --     Most recent vital signs: There were no vitals filed for this visit.  Physical Exam: I have reviewed the vital signs and nursing notes. General: Awake, alert, no acute distress.  Nontoxic appearing. Head:  Atraumatic, normocephalic.   ENT:  EOM intact, PERRL. Oral mucosa is pink and moist with no lesions. Neck: Neck is supple with full range of motion,  Cardiovascular:  RRR, No murmurs. Peripheral pulses palpable and equal bilaterally. Respiratory:  Symmetrical  chest wall expansion.  No rhonchi, rales, or wheezes.  Good air movement throughout.  No use of accessory muscles.   Musculoskeletal:  No cyanosis or edema. Moving extremities with full ROM Abdomen:  Soft, nontender, nondistended. Neuro:  GCS 15, moving all four extremities, interacting appropriately. Alert and oriented with cogent speech; 5/5 motor strength all 4 extremities with intact peripheral nerve distributions; gross sensory intact to challenge in all 4 extremities and peripheral nerve distributions; no clonus; cerebellar examination normal including Romberg, finger-to-nose, heel-to-shin, and without trunkal ataxia, dysdiadochokinesis or dysmetria; cranial nerves 2-12 intact to gross challenge bilaterally. Psych:  Calm, appropriate.   Skin:  Warm, dry, no rash.    ED Results / Procedures / Treatments   Labs (all labs ordered are listed, but only abnormal results are displayed) Labs Reviewed  CBC WITH DIFFERENTIAL/PLATELET - Abnormal; Notable for the following components:      Result Value   Lymphs Abs 0.6 (*)    All other components within normal limits  BASIC METABOLIC PANEL WITH GFR - Abnormal; Notable for the following components:  Glucose, Bld 115 (*)    Calcium  8.8 (*)    All other components within normal limits     EKG My EKG interpretation: Rate of 54 with occasional PVC.  No acute ST elevations or depressions   RADIOLOGY Independent of a CT head with no acute pathology   PROCEDURES:  Critical Care performed: No  Procedures   MEDICATIONS ORDERED IN ED: Medications - No data to display   IMPRESSION / MDM / ASSESSMENT AND PLAN / ED COURSE  I reviewed the triage vital signs and the nursing notes.                              Differential diagnosis includes, but is not limited to, orthostatic hypotension, BPPV, peripheral dizziness, lower suspicion for CVA  Patient's presentation is most consistent with exacerbation of chronic illness.  Patient is a  89 year old male with history of dizziness presenting today for repeat episode.  By time of arrival to the ED he is currently asymptomatic.  Neurological exam at this time completely unremarkable.  NIHSS of 0 with no ongoing symptoms that would at all replicate a stroke.  EKG consistent with baseline and laboratory workup reassuring.  CT head negative.  Patient was reassessed and continues to deny any ongoing symptoms along with son at the bedside who agrees.  He was able to ambulate without any difficulty and feels back to his baseline.  It does seem most suspicious for his chronic dizziness episodes which patient and his son agree with.  We did discuss possible MRI but they do not think this is necessary and I agree with that at this time.  Given strict return precautions and they are agreeable with discharge.  The patient is on the cardiac monitor to evaluate for evidence of arrhythmia and/or significant heart rate changes. Clinical Course as of 10/08/23 0049  Mon Oct 07, 2023  2217 Patient ambulating with no difficulty.  Discussed possible further workup but patient and his son state he has issues with vertigo frequently and takes medicine for it.  They do not want additional workup such as MRI.  Will discharge at this time [DW]    Clinical Course User Index [DW] Malvina Alm DASEN, MD     FINAL CLINICAL IMPRESSION(S) / ED DIAGNOSES   Final diagnoses:  Dizziness     Rx / DC Orders   ED Discharge Orders     None        Note:  This document was prepared using Dragon voice recognition software and may include unintentional dictation errors.   Malvina Alm DASEN, MD 10/08/23 [phone]

## 2023-10-09 ENCOUNTER — Encounter: Payer: Self-pay | Admitting: Nurse Practitioner

## 2023-10-09 ENCOUNTER — Ambulatory Visit (INDEPENDENT_AMBULATORY_CARE_PROVIDER_SITE_OTHER): Payer: Self-pay | Admitting: Nurse Practitioner

## 2023-10-09 VITALS — BP 132/78 | HR 58 | Temp 98.2°F | Resp 18 | Ht 73.0 in | Wt 210.0 lb

## 2023-10-09 DIAGNOSIS — E782 Mixed hyperlipidemia: Secondary | ICD-10-CM

## 2023-10-09 DIAGNOSIS — I10 Essential (primary) hypertension: Secondary | ICD-10-CM | POA: Diagnosis not present

## 2023-10-09 DIAGNOSIS — E66811 Obesity, class 1: Secondary | ICD-10-CM | POA: Diagnosis not present

## 2023-10-09 DIAGNOSIS — F3342 Major depressive disorder, recurrent, in full remission: Secondary | ICD-10-CM

## 2023-10-09 DIAGNOSIS — Z6830 Body mass index (BMI) 30.0-30.9, adult: Secondary | ICD-10-CM

## 2023-10-09 DIAGNOSIS — F418 Other specified anxiety disorders: Secondary | ICD-10-CM

## 2023-10-09 DIAGNOSIS — N401 Enlarged prostate with lower urinary tract symptoms: Secondary | ICD-10-CM

## 2023-10-09 DIAGNOSIS — F3341 Major depressive disorder, recurrent, in partial remission: Secondary | ICD-10-CM

## 2023-10-09 DIAGNOSIS — Z9989 Dependence on other enabling machines and devices: Secondary | ICD-10-CM

## 2023-10-09 DIAGNOSIS — R42 Dizziness and giddiness: Secondary | ICD-10-CM

## 2023-10-09 DIAGNOSIS — E6609 Other obesity due to excess calories: Secondary | ICD-10-CM

## 2023-10-09 NOTE — Assessment & Plan Note (Signed)
 Continue taking Buspar  10mg  twice daily as needed.

## 2023-10-09 NOTE — Assessment & Plan Note (Signed)
 Continue taking Atorvastatin  20mg  daily and limiting saturated fats.

## 2023-10-09 NOTE — Assessment & Plan Note (Signed)
 Continue taking Alfuzosin  10mg  and Finasteride  5mg  daily.

## 2023-10-09 NOTE — Assessment & Plan Note (Signed)
 Continue taking Diltiazem  120 mg daily. Continue taking aspirin  81mg  daily for cardiovascular health. BP today was 132/78.

## 2023-10-09 NOTE — Assessment & Plan Note (Signed)
Continue with life style modifications

## 2023-10-09 NOTE — Progress Notes (Signed)
 BP 132/78   Pulse (!) 58   Temp 98.2 F (36.8 C)   Resp 18   Ht 6' 1 (1.854 m)   Wt 210 lb (95.3 kg)   SpO2 96%   BMI 27.71 kg/m    Subjective:    Patient ID: Jeffery Campbell, male    DOB: 1929-03-02, 89 y.o.   MRN: [id number]  HPI: Jeffery Campbell is a 89 y.o. male here for a 6 month follow-up for chronic medical management:  Hypertension: -Taking Diltiazem  120mg  daily. -BP today 132/78  Benign Prostate Hyperplasia: -Taking Alfuzosin  10mg  daily and Finasteride  5mg  daily.  -Reports urinating 1-2 times per night   Mixed Hyperlipidemia:  -Taking Atorvastatin  20mg  daily   Vertigo: -Taking Antivert  as needed  -Seen in ER on 10/07/23 for recent episode of dizziness. Denied any vision or speech changes. Was discharged same day with unremarkable exam. Lab work was taken in hospital.  -Denies dizziness today. -Interested in PT for recurrence of vertigo episodes.    Anxiety/ Depression: -Taking Buspar  10mg  twice daily as needed   Class 1 Obesity: -Continue with lifestyle modifications                  08/21/2023    3:21 PM 05/09/2023    8:53 AM 04/11/2023    9:35 AM  Depression screen PHQ 2/9  Decreased Interest 0 0 0  Down, Depressed, Hopeless 0 0 0  PHQ - 2 Score 0 0 0  Altered sleeping 0 0 1  Tired, decreased energy 0 0 1  Change in appetite 0 0 0  Feeling bad or failure about yourself  0 0 0  Trouble concentrating 0 0 0  Moving slowly or fidgety/restless 0 0 1  Suicidal thoughts 0 0 0  PHQ-9 Score 0 0 3  Difficult doing work/chores Not difficult at all Not difficult at all Not difficult at all    Relevant past medical, surgical, family and social history reviewed and updated as indicated. Interim medical history since our last visit reviewed. Allergies and medications reviewed and updated.  Review of Systems Ten systems reviewed and is negative except as mentioned in HPI      Objective:     BP 132/78   Pulse (!) 58   Temp 98.2 F (36.8 C)   Resp  18   Ht 6' 1 (1.854 m)   Wt 210 lb (95.3 kg)   SpO2 96%   BMI 27.71 kg/m    Wt Readings from Last 3 Encounters:  10/09/23 210 lb (95.3 kg)  10/07/23 211 lb 3.2 oz (95.8 kg)  08/21/23 211 lb 4.8 oz (95.8 kg)    Physical Exam Cardiovascular:     Rate and Rhythm: Regular rhythm.     Pulses: Normal pulses.     Heart sounds: Normal heart sounds.  Pulmonary:     Breath sounds: Normal breath sounds.  Skin:    General: Skin is warm and dry.  Neurological:     General: No focal deficit present.     Mental Status: He is alert. Mental status is at baseline.     Comments: Slightly confused, but that's his normal.   Psychiatric:        Behavior: Behavior normal.      Results for orders placed or performed during the hospital encounter of 10/07/23  CBC with Differential   Collection Time: 10/07/23  8:52 PM  Result Value Ref Range   WBC 6.0 4.0 - 10.5 K/uL   RBC  4.86 4.22 - 5.81 MIL/uL   Hemoglobin 14.8 13.0 - 17.0 g/dL   HCT 56.3 60.9 - 47.9 %   MCV 89.7 80.0 - 100.0 fL   MCH 30.5 26.0 - 34.0 pg   MCHC 33.9 30.0 - 36.0 g/dL   RDW 87.7 88.4 - 84.4 %   Platelets 169 150 - 400 K/uL   nRBC 0.0 0.0 - 0.2 %   Neutrophils Relative % 81 %   Neutro Abs 4.9 1.7 - 7.7 K/uL   Lymphocytes Relative 10 %   Lymphs Abs 0.6 (L) 0.7 - 4.0 K/uL   Monocytes Relative 7 %   Monocytes Absolute 0.4 0.1 - 1.0 K/uL   Eosinophils Relative 1 %   Eosinophils Absolute 0.0 0.0 - 0.5 K/uL   Basophils Relative 1 %   Basophils Absolute 0.0 0.0 - 0.1 K/uL   Immature Granulocytes 0 %   Abs Immature Granulocytes 0.02 0.00 - 0.07 K/uL  Basic metabolic panel   Collection Time: 10/07/23  8:52 PM  Result Value Ref Range   Sodium 138 135 - 145 mmol/L   Potassium 3.7 3.5 - 5.1 mmol/L   Chloride 106 98 - 111 mmol/L   CO2 24 22 - 32 mmol/L   Glucose, Bld 115 (H) 70 - 99 mg/dL   BUN 16 8 - 23 mg/dL   Creatinine, Ser 8.96 0.61 - 1.24 mg/dL   Calcium  8.8 (L) 8.9 - 10.3 mg/dL   GFR, Estimated >39 >39 mL/min    Anion gap 8 5 - 15          Assessment & Plan:   Problem List Items Addressed This Visit       Cardiovascular and Mediastinum   Essential hypertension - Primary (Chronic)   Continue taking Diltiazem  120 mg daily. Continue taking aspirin  81mg  daily for cardiovascular health. BP today was 132/78.        Genitourinary   BPH (benign prostatic hyperplasia)   Continue taking Alfuzosin  10mg  and Finasteride  5mg  daily.         Other   HLD (hyperlipidemia) (Chronic)   Continue taking Atorvastatin  20mg  daily and limiting saturated fats.       Class 1 obesity due to excess calories with serious comorbidity and body mass index (BMI) of 30.0 to 30.9 in adult   Continue with lifestyle modifications       Dizziness   Continue taking Antivert  as needed. Placed a referral for home health physical therapy due to recurrence of vertigo episodes and recent visit to ER for dizziness on 10/07/23.      Relevant Orders   Ambulatory referral to Home Health   Recurrent major depressive disorder, in partial remission (HCC)   Continue taking Buspar  10mg  twice daily as needed.       Anxiety with depression   Continue taking Buspar  10mg  twice daily as needed.       Other Visit Diagnoses       Recurrent major depressive disorder, in full remission (HCC)         Does mobilize using walker       Continue walking at home with walker. PT referral placed for vertigo sx and recent visit to ER on 10/07/23 for dizziness.   Relevant Orders   Ambulatory referral to Home Health          General Health Maintenance:  -Routine labs were done in the ER on 10/07/23 -Continue taking Aspirin  81 mg daily for cardiovascular health  Follow up plan: Return in about 6 months (around 04/10/2024).   I have reviewed this encounter including the documentation in this note and/or discussed this patient with the provider, Aislinn Womack, SNP, I am certifying that I agree with the content of this note as  supervising/preceptor nurse practitioner.  Mliss Spray, [name]-C [facility] 10/09/2023, 10:19 AM

## 2023-10-09 NOTE — Assessment & Plan Note (Signed)
 Continue taking Antivert  as needed. Placed a referral for home health physical therapy due to recurrence of vertigo episodes and recent visit to ER for dizziness on 10/07/23.

## 2023-10-18 ENCOUNTER — Other Ambulatory Visit (HOSPITAL_COMMUNITY): Payer: Self-pay

## 2023-10-21 ENCOUNTER — Telehealth: Payer: Self-pay

## 2023-10-21 NOTE — Telephone Encounter (Signed)
 Copied from CRM [id number]. Topic: Clinical - Home Health Verbal Orders >> Oct 21, 2023 10:37 AM Antonio DEL wrote: Caller/Agency: Darryle with [facility]  Callback Number: [phone] Service Requested: Physical Therapy Frequency: 1 time a week for 5 weeks  Any new concerns about the patient? No

## 2023-10-21 NOTE — Telephone Encounter (Signed)
 Called and lvm w/verbal order

## 2023-12-17 ENCOUNTER — Other Ambulatory Visit: Payer: Self-pay | Admitting: Nurse Practitioner

## 2023-12-17 ENCOUNTER — Telehealth: Payer: Self-pay

## 2023-12-17 ENCOUNTER — Other Ambulatory Visit: Payer: Self-pay

## 2023-12-17 ENCOUNTER — Other Ambulatory Visit: Payer: Self-pay | Admitting: Urology

## 2023-12-17 DIAGNOSIS — N138 Other obstructive and reflux uropathy: Secondary | ICD-10-CM

## 2023-12-17 DIAGNOSIS — F419 Anxiety disorder, unspecified: Secondary | ICD-10-CM

## 2023-12-17 MED ORDER — ALFUZOSIN HCL ER 10 MG PO TB24: 10.0000 mg | ORAL_TABLET | Freq: Every day | ORAL | 0 refills | Status: DC

## 2023-12-17 MED ORDER — FINASTERIDE 5 MG PO TABS: 5.0000 mg | ORAL_TABLET | Freq: Every day | ORAL | 0 refills | Status: DC

## 2023-12-17 NOTE — Telephone Encounter (Signed)
 Pt.notified

## 2023-12-17 NOTE — Telephone Encounter (Signed)
 Copied from CRM #[id number]. Topic: Referral - Question >> Dec 17, 2023  9:41 AM Charlet HERO wrote: Reason for CRM: Patient is wanting to have a referral for a nurse to come in an check on him in the mornings since he suffers from dizziness.

## 2023-12-17 NOTE — Telephone Encounter (Unsigned)
 Copied from CRM [id number]. Topic: Clinical - Medication Refill >> Dec 17, 2023  9:37 AM Charlet HERO wrote: Medication: alfuzosin  (UROXATRAL ) 10 MG 24 hr tablet  Has the patient contacted their pharmacy? Yes Stated that could not fill the script it shows 3 refills  This is the patient's preferred pharmacy:  [facility] [address], [place] - [address] [place] [address] Phone: [phone] Fax: [phone]    Is this the correct pharmacy for this prescription? Yes If no, delete pharmacy and type the correct one.   Has the prescription been filled recently? Yes  Is the patient out of the medication? Yes  Has the patient been seen for an appointment in the last year OR does the patient have an upcoming appointment? Yes  Can we respond through MyChart? No  Agent: Please be advised that Rx refills may take up to 3 business days. We ask that you follow-up with your pharmacy.

## 2023-12-18 NOTE — Telephone Encounter (Signed)
 Requested Prescriptions  Pending Prescriptions Disp Refills   busPIRone  (BUSPAR ) 10 MG tablet [Pharmacy Med Name: BUSPIRONE  HCL 10 MG TABLET] 180 tablet 0    Sig: TAKE 1 TABLET (10 MG TOTAL) BY MOUTH 2 (TWO) TIMES DAILY AS NEEDED (FOR ANXIETY).     Psychiatry: Anxiolytics/Hypnotics - Non-controlled Passed - 12/18/2023 12:17 PM      Passed - Valid encounter within last 12 months    Recent Outpatient Visits           2 months ago Essential hypertension   [facility] Jeffery Jeffery FALCON, Jeffery Campbell   3 months ago Dizziness   [facility] Jeffery Jeffery FALCON, Jeffery Campbell       Future Appointments             In 2 weeks [name], Jeffery Campbell   In 3 months Jeffery, Jeffery FALCON, Jeffery Campbell [name], Jeffery Campbell

## 2023-12-19 ENCOUNTER — Telehealth: Payer: Self-pay | Admitting: Urology

## 2023-12-19 NOTE — Telephone Encounter (Signed)
 Jeffery Campbell was seen in the emergency department recently for orthostatic hypotension.  He needs to stop the alfuzosin  (Uroxatrol) at this time as it causes dizziness due to orthostatic hypotension.

## 2023-12-26 ENCOUNTER — Other Ambulatory Visit: Payer: Self-pay | Admitting: Nurse Practitioner

## 2023-12-26 ENCOUNTER — Other Ambulatory Visit: Payer: Self-pay | Admitting: Urology

## 2023-12-26 DIAGNOSIS — J329 Chronic sinusitis, unspecified: Secondary | ICD-10-CM

## 2023-12-26 DIAGNOSIS — E782 Mixed hyperlipidemia: Secondary | ICD-10-CM

## 2023-12-26 DIAGNOSIS — N138 Other obstructive and reflux uropathy: Secondary | ICD-10-CM

## 2023-12-27 NOTE — Telephone Encounter (Signed)
 Requested Prescriptions  Pending Prescriptions Disp Refills   atorvastatin  (LIPITOR) 20 MG tablet [Pharmacy Med Name: ATORVASTATIN  20 MG TABLET] 90 tablet 0    Sig: TAKE 1 TABLET BY MOUTH EVERYDAY AT BEDTIME     Cardiovascular:  Antilipid - Statins Failed - 12/27/2023 11:23 AM      Failed - Lipid Panel in normal range within the last 12 months    Cholesterol, Total  Date Value Ref Range Status  09/22/2014 134 100 - 199 mg/dL Final   Cholesterol  Date Value Ref Range Status  04/11/2023 115 <200 mg/dL Final   LDL Cholesterol (Calc)  Date Value Ref Range Status  04/11/2023 52 mg/dL (calc) Final    Comment:    Reference range: <100 . Desirable range <100 mg/dL for primary prevention;   <70 mg/dL for patients with CHD or diabetic patients  with > or = 2 CHD risk factors. SABRA LDL-C is now calculated using the Prime-Hopkins  calculation, which is a validated novel method providing  better accuracy than the Friedewald equation in the  estimation of LDL-C.  Gladis APPLETHWAITE et al. SANDREA. 7986;689(80): 2061-2068  (http://education.[employer].com/faq/FAQ164)    HDL  Date Value Ref Range Status  04/11/2023 50 > OR = 40 mg/dL Final  93/77/7983 46 >60 mg/dL Final    Comment:    According to ATP-III Guidelines, HDL-C >59 mg/dL is considered a negative risk factor for CHD.    Triglycerides  Date Value Ref Range Status  04/11/2023 52 <150 mg/dL Final         Passed - Patient is not pregnant      Passed - Valid encounter within last 12 months    Recent Outpatient Visits           2 months ago Essential hypertension    [facility] Gareth Clarity F, [name]   4 months ago Dizziness   [facility] [place] [place] Gareth Clarity FALCON, [name]       Future Appointments             In 6 days [name], Clotilda DELENA RIGGERS [facility] [place]   In 3 months Gareth, Clarity FALCON, [name] [facility] [place]              levocetirizine (XYZAL ) 5 MG tablet [Pharmacy Med Name: LEVOCETIRIZINE 5 MG TABLET] 90 tablet 0    Sig: TAKE 1 TABLET BY MOUTH EVERY DAY IN THE EVENING     Ear, Nose, and Throat:  Antihistamines - levocetirizine dihydrochloride  Passed - 12/27/2023 11:23 AM      Passed - Cr in normal range and within 360 days    Creat  Date Value Ref Range Status  04/11/2023 1.23 (H) 0.70 - 1.22 mg/dL Final   Creatinine, Ser  Date Value Ref Range Status  10/07/2023 1.03 0.61 - 1.24 mg/dL Final         Passed - eGFR is 10 or above and within 360 days    GFR, Est African American  Date Value Ref Range Status  11/12/2019 63 > OR = 60 mL/min/1.49m2 Final   GFR, Est Non African American  Date Value Ref Range Status  11/12/2019 54 (L) > OR = 60 mL/min/1.80m2 Final   GFR, Estimated  Date Value Ref Range Status  10/07/2023 >60 >60 mL/min Final    Comment:    (NOTE) Calculated using the CKD-EPI Creatinine Equation (2021)    eGFR  Date Value Ref Range Status  04/11/2023  55 (L) > OR = 60 mL/min/1.3m2 Final         Passed - Valid encounter within last 12 months    Recent Outpatient Visits           2 months ago Essential hypertension   [facility] Gareth Mliss FALCON, [name]   4 months ago Dizziness   [facility] Gareth Mliss FALCON, [name]       Future Appointments             In 6 days [name], Clotilda DELENA RIGGERS [facility]rology [place]   In 3 months Gareth, Mliss FALCON, [name] [facility] [place]

## 2024-01-01 NOTE — Progress Notes (Unsigned)
 01/02/2024  4:55 PM   Jeffery Campbell 1928-12-09 [id number]  Referring provider: Gareth Mliss FALCON, [name] [address] [place],  [place] [address]   Urological history: 1. BPH with LU TS -alfuzosin  10 mg daily and finasteride  5 mg daily  Chief Complaint  Patient presents with   Benign Prostatic Hypertrophy    HPI: Jeffery Campbell is a 89 y.o. male who presents for a yearly visit.   Previous records reviewed.    I PSS 12/2  He has been having dizzy spells recently.    He was having a weak urinary stream and nocturia x 3.  Patient denies any modifying or aggravating factors.  Patient denies any recent UTI's, gross hematuria, dysuria or suprapubic/flank pain.  Patient denies any fevers, chills, nausea or vomiting.    PVR 0 mL   Serum creatinine (10/2023) 1.03, eGFR >60  PMH: Past Medical History:  Diagnosis Date   Arrhythmia    Cough    CHRONIC   Depression    Disturbance of sleep    Diverticulitis    GERD (gastroesophageal reflux disease)    Hemorrhoids    Hypertension    OA (osteoarthritis)    Pure hypercholesterolemia    Sleep apnea    Stroke [facility])     Surgical History: Past Surgical History:  Procedure Laterality Date   CATARACT EXTRACTION W/PHACO Left 02/14/2016   Procedure: CATARACT EXTRACTION PHACO AND INTRAOCULAR LENS PLACEMENT (IOC);  Surgeon: Elsie Carmine, MD;  Location: [facility] ORS;  Service: Ophthalmology;  Laterality: Left;  US  53.7 AP% 21.5 CDE 11.58 FLUID PACK LOT # [id number]   CATARACT EXTRACTION W/PHACO Right 12/18/2018   Procedure: CATARACT EXTRACTION PHACO AND INTRAOCULAR LENS PLACEMENT (IOC)  RIGHT, VISION BLUE;  Surgeon: Ferol Rogue, MD;  Location: [facility] ORS;  Service: Ophthalmology;  Laterality: Right;  US   01:14 CDE 11.82 Fluid pack lot # [id number]   COLONOSCOPY     KNEE ARTHROSCOPY     TRANSURETHRAL RESECTION OF PROSTATE      Home Medications:  Allergies as of 01/02/2024   No Known Allergies      Medication List         Accurate as of January 02, 2024  4:55 PM. If you have any questions, ask your nurse or doctor.          acetaminophen  325 MG tablet Commonly known as: TYLENOL  Take 325-650 mg by mouth every 6 (six) hours as needed for moderate pain or headache.   alfuzosin  10 MG 24 hr tablet Commonly known as: UROXATRAL  Take 1 tablet (10 mg total) by mouth daily with breakfast.   atorvastatin  20 MG tablet Commonly known as: LIPITOR TAKE 1 TABLET BY MOUTH EVERYDAY AT BEDTIME   busPIRone  10 MG tablet Commonly known as: BUSPAR  TAKE 1 TABLET (10 MG TOTAL) BY MOUTH 2 (TWO) TIMES DAILY AS NEEDED (FOR ANXIETY).   diltiazem  60 MG tablet Commonly known as: Cardizem  Take 1 tablet (60 mg total) by mouth daily.   finasteride  5 MG tablet Commonly known as: PROSCAR  Take 1 tablet (5 mg total) by mouth daily.   Fish Oil  1200 MG Caps Take 2 capsules (2,400 mg total) by mouth in the morning and at bedtime.   levocetirizine 5 MG tablet Commonly known as: XYZAL  TAKE 1 TABLET BY MOUTH EVERY DAY IN THE EVENING   meclizine  12.5 MG tablet Commonly known as: ANTIVERT  Take 1 tablet (12.5 mg total) by mouth daily as needed for dizziness (vertigo episodes). Use sparingly.  Must  get appointment if using daily or symptoms worsen   melatonin 5 MG Tabs Take 5 mg by mouth at bedtime.   Prevagen 10 MG Caps Generic drug: Apoaequorin Take 10 capsules by mouth daily.   Superior Prostate Tabs Take 250 tablets by mouth daily.        Allergies: No Known Allergies  Family History: Family History  Problem Relation Age of Onset   Hypertension Son        Kurt of parents health.   Prostate cancer Neg Hx    Bladder Cancer Neg Hx    Kidney cancer Neg Hx     Social History:  reports that he has never smoked. He has never used smokeless tobacco. He reports that he does not drink alcohol and does not use drugs.  ROS: For pertinent review of systems please refer to history of present illness  Physical  Exam: BP (!) 161/77   Pulse 79   Ht 6' 1 (1.854 m)   Wt 210 lb (95.3 kg)   BMI 27.71 kg/m   Constitutional:  Well nourished. Alert and oriented, No acute distress. HEENT: [place] AT, moist mucus membranes.  Trachea midline Cardiovascular: No clubbing, cyanosis, or edema. Respiratory: Normal respiratory effort, no increased work of breathing. Neurologic: Grossly intact, no focal deficits, moving all 4 extremities. Psychiatric: Normal mood and affect.   Laboratory Data: Lab Results  Component Value Date   WBC 6.0 10/07/2023   HGB 14.8 10/07/2023   HCT 43.6 10/07/2023   MCV 89.7 10/07/2023   PLT 169 10/07/2023    Lab Results  Component Value Date   CREATININE 1.03 10/07/2023  I have reviewed the labs.  See HPI.   Pertinent imaging  01/02/24 15:50  Scan Result 0ml    Assessment & Plan:    1. BPH with LUTS -At goal with his urinary symptoms -continue conservative management, avoiding bladder irritants and timed voiding's -discontinue alfuzosin  10 mg daily as dizziness is a side effect of this medication  -continue finasteride  5 mg daily      Return in about 2 months (around 03/03/2024) for I PSS/PVR .  These notes generated with voice recognition software. I apologize for typographical errors.  CLOTILDA HELON RIGGERS  [facility] [address]  [address] [place], [place] [address] [phone]

## 2024-01-02 ENCOUNTER — Ambulatory Visit: Admitting: Urology

## 2024-01-02 ENCOUNTER — Encounter: Payer: Self-pay | Admitting: Urology

## 2024-01-02 VITALS — BP 161/77 | HR 79 | Ht 73.0 in | Wt 210.0 lb

## 2024-01-02 DIAGNOSIS — N401 Enlarged prostate with lower urinary tract symptoms: Secondary | ICD-10-CM | POA: Diagnosis not present

## 2024-01-02 DIAGNOSIS — N138 Other obstructive and reflux uropathy: Secondary | ICD-10-CM

## 2024-01-02 LAB — BLADDER SCAN AMB NON-IMAGING

## 2024-01-02 NOTE — Patient Instructions (Signed)
 Stop the alfuzosin 

## 2024-01-08 ENCOUNTER — Ambulatory Visit: Payer: Self-pay

## 2024-01-08 NOTE — Telephone Encounter (Signed)
 Copied from CRM #[id number]. Topic: Clinical - Red Word Triage >> Jan 08, 2024  8:31 AM Berneda FALCON wrote: Red Word that prompted transfer to Nurse Triage: Son, Rexie, calling in stating father has become more and more dizzy over the last couple of weeks.  States that he woke up this morning dizzy again and thinks this could be a result of recent medication changes. Did not know the name of the medication but states that another PCP took him off one of the medications last week and he has been waking up dizzy since then.  Wanted to know if he needs to go back on this medication or what they should do in the meantime. Reason for Disposition  [1] MILD dizziness (e.g., walking normally) AND [2] has NOT been evaluated by doctor (or NP/PA) for this  (Exception: Dizziness caused by heat exposure, sudden standing, or poor fluid intake.)  Answer Assessment - Initial Assessment Questions Patient's son states patient is prescribed Meclizine  and he may need an increase. Son states patient's BP has been running good.   1. DESCRIPTION: Describe your dizziness.     Gets dizzy when he tries to stand up 2. LIGHTHEADED: Do you feel lightheaded? (e.g., somewhat faint, woozy, weak upon standing)     Dizziness when he stands up in the morning time - just in the morning 5. ONSET:  When did the dizziness begin?     Ongoing for weeks, happens in mornings 6. AGGRAVATING FACTORS: Does anything make it worse? (e.g., standing, change in head position)     Standing 7. HEART RATE: Can you tell me your heart rate? How many beats in 15 seconds?  (Note: Not all patients can do this.)       Son is unsure 8. CAUSE: What do you think is causing the dizziness? (e.g., decreased fluids or food, diarrhea, emotional distress, heat exposure, new medicine, sudden standing, vomiting; unknown)     unkown 9. RECURRENT SYMPTOM: Have you had dizziness before? If Yes, ask: When was the last time? What happened that  time?     Yes 10. OTHER SYMPTOMS: Do you have any other symptoms? (e.g., fever, chest pain, vomiting, diarrhea, bleeding)       States HR is good  Protocols used: Dizziness - Lightheadedness-A-AH

## 2024-01-09 ENCOUNTER — Other Ambulatory Visit: Payer: Self-pay | Admitting: Urology

## 2024-01-09 DIAGNOSIS — N138 Other obstructive and reflux uropathy: Secondary | ICD-10-CM

## 2024-01-10 ENCOUNTER — Ambulatory Visit: Admitting: Nurse Practitioner

## 2024-01-10 ENCOUNTER — Encounter: Payer: Self-pay | Admitting: Nurse Practitioner

## 2024-01-10 DIAGNOSIS — R42 Dizziness and giddiness: Secondary | ICD-10-CM | POA: Diagnosis not present

## 2024-01-10 MED ORDER — MECLIZINE HCL 12.5 MG PO TABS: 12.5000 mg | ORAL_TABLET | Freq: Every day | ORAL | 0 refills | Status: DC | PRN

## 2024-01-10 NOTE — Progress Notes (Signed)
 BP 120/78   Pulse 64   Resp 16   Ht 6' 1 (1.854 m)   Wt 210 lb (95.3 kg)   SpO2 98%   BMI 27.71 kg/m    Subjective:    Patient ID: Jeffery Campbell, male    DOB: 1928-04-23, 89 y.o.   MRN: [id number]  HPI: Jeffery Campbell is a 89 y.o. male  Chief Complaint  Patient presents with   Dizziness    Stated last 2 days no problems and feels good. Started after being taken off a medication- they cannot remember med name. It happened in the mornings only w/standing   Discussed the use of AI scribe software for clinical note transcription with the patient, who gave verbal consent to proceed.  History of Present Illness [name] is a 89 year old male who presents with dizziness after a medication change.  Dizziness - Experienced dizziness after discontinuation of alfuzosin  while continuing finasteride  as part of his urology medication regimen - Currently denies dizziness - Previously used meclizine  for dizziness but does not have any on hand -neuro exam was normal for patient -vital signs normal  General well-being and functional status - Feels well and is able to engage in activities such as going to the golf course and running errands without difficulty - Expresses surprise at how good he feels, stating, 'I can't believe I feel so damn good.'  Cardiopulmonary symptoms - No chest pain - No shortness of breath         08/21/2023    3:21 PM 05/09/2023    8:53 AM 04/11/2023    9:35 AM  Depression screen PHQ 2/9  Decreased Interest 0 0 0  Down, Depressed, Hopeless 0 0 0  PHQ - 2 Score 0 0 0  Altered sleeping 0 0 1  Tired, decreased energy 0 0 1  Change in appetite 0 0 0  Feeling bad or failure about yourself  0 0 0  Trouble concentrating 0 0 0  Moving slowly or fidgety/restless 0 0 1  Suicidal thoughts 0 0 0  PHQ-9 Score 0 0 3  Difficult doing work/chores Not difficult at all Not difficult at all Not difficult at all    Relevant past medical, surgical, family and social history  reviewed and updated as indicated. Interim medical history since our last visit reviewed. Allergies and medications reviewed and updated.  Review of Systems  Ten systems reviewed and is negative except as mentioned in HPI      Objective:      BP 120/78   Pulse 64   Resp 16   Ht 6' 1 (1.854 m)   Wt 210 lb (95.3 kg)   SpO2 98%   BMI 27.71 kg/m    Wt Readings from Last 3 Encounters:  01/10/24 210 lb (95.3 kg)  01/02/24 210 lb (95.3 kg)  10/09/23 210 lb (95.3 kg)    Physical Exam GENERAL: Alert, cooperative, well developed, no acute distress. HEENT: Normocephalic, normal oropharynx, moist mucous membranes, external auditory canals clear bilaterally. CHEST: Clear to auscultation bilaterally, no wheezes, rhonchi, or crackles. CARDIOVASCULAR: Normal heart rate and rhythm, S1 and S2 normal without murmurs. ABDOMEN: Soft, non-tender, non-distended, without organomegaly, normal bowel sounds. EXTREMITIES: No cyanosis or edema. NEUROLOGICAL: Cranial nerves II-XII grossly intact, moves all extremities without gross motor or sensory deficit, motor function normal with strength 5/5, proprioception intact with no pronator drift.  Results for orders placed or performed in visit on 01/02/24  BLADDER SCAN AMB NON-IMAGING  Collection Time: 01/02/24  3:50 PM  Result Value Ref Range   Scan Result 0ml           Assessment & Plan:   Problem List Items Addressed This Visit       Other   Dizziness   Relevant Medications   meclizine  (ANTIVERT ) 12.5 MG tablet     Assessment and Plan Assessment & Plan Dizziness Dizziness reported after discontinuation of alfuzosin  by urology. Currently denies dizziness. Neurological examination is normal, indicating no acute neurological deficits. Previous blood work in July was normal, suggesting no metabolic cause. Dizziness appears resolved with discontinuation of alfuzosin . Patient reports he is feeling wonderful today, no dizziness,  nothing  concerning noted on exam - [facility] [address] for use as needed for dizziness.        Follow up plan: Return if symptoms worsen or fail to improve.

## 2024-02-11 ENCOUNTER — Ambulatory Visit: Payer: Self-pay

## 2024-02-11 DIAGNOSIS — R269 Unspecified abnormalities of gait and mobility: Secondary | ICD-10-CM

## 2024-02-11 DIAGNOSIS — R42 Dizziness and giddiness: Secondary | ICD-10-CM

## 2024-02-11 DIAGNOSIS — R2681 Unsteadiness on feet: Secondary | ICD-10-CM

## 2024-02-11 NOTE — Telephone Encounter (Signed)
 FYI Only or Action Required?: Action required by provider: referral request.  Patient was last seen in primary care on 01/10/2024 by Gareth Mliss FALCON, [name].  Called Nurse Triage reporting No chief complaint on file..  Symptoms began n/a.  Interventions attempted: Other: n/a.  Symptoms are: n/a.  Triage Disposition: Call PCP Within 24 Hours  Patient/caregiver understands and will follow disposition?: Yes    Copied from CRM #[id number]. Topic: Clinical - Red Word Triage >> Feb 11, 2024  9:31 AM Jeffery Campbell wrote: Red Word that prompted transfer to Nurse Triage: requesting for nurse to go by and check on him, patient will be dizzy in the mornings Reason for Disposition  [1] Follow-up call from patient regarding patient's clinical status AND [2] information NON-URGENT  Answer Assessment - Initial Assessment Questions See info only triage  Answer Assessment - Initial Assessment Questions 1. REASON FOR CALL or QUESTION: What is your reason for calling today? or How can I best     Pt's son called requesting a nurse or some service to just go check on his dad 1-3 times a week. He states that his dad is very independent but just needs a little bit of help at times. Rn advised note states pt is having dizziness. He states the patient has dizziness in the mornings because he gets up too fast but he takes that pill and feels much better. He states he was just calling to see if they could get some assistance at his home. He states pt is ready for a nursing home or anything like that yet.  2. CALLER: Document the source of call. (e.g., laboratory staff, caregiver or patient).     Pt's Son Jeffery Campbell  Protocols used: Dizziness [name], PCP Call - No Triage-A-AH

## 2024-02-25 ENCOUNTER — Telehealth: Payer: Self-pay

## 2024-02-25 NOTE — Telephone Encounter (Signed)
 Copied from CRM #[id number]. Topic: General - Call Back - No Documentation >> Feb 25, 2024 10:43 AM Winona R wrote: Pt son calling to find out if office call the pt as he called on the 11th to request a nurse visit. Never received a call

## 2024-02-25 NOTE — Telephone Encounter (Signed)
 No answer vm did not pick up. Pt will need an appointment

## 2024-02-27 ENCOUNTER — Other Ambulatory Visit: Payer: Self-pay | Admitting: Nurse Practitioner

## 2024-02-27 DIAGNOSIS — I10 Essential (primary) hypertension: Secondary | ICD-10-CM

## 2024-03-02 NOTE — Progress Notes (Unsigned)
 03/03/2024  4:45 PM   Jeffery Campbell [birth year] [id number]  Referring provider: Gareth Mliss FALCON, [name] [address] [place],  [place] [address]  Urological history: 1. BPH with LU TS -alfuzosin  10 mg daily and finasteride  5 mg daily  Chief Complaint  Patient presents with   BPH with obstruction/lower urinary tract symptoms    HPI: Jeffery Campbell is a 89 y.o. male who presents for two month follow up.   Previous records reviewed.    I PSS 11/0   He reports sensation of incomplete bladder emptying, urinary frequency, and nocturia x 3   Patient denies any modifying or aggravating factors.  Patient denies any recent UTI's, gross hematuria, dysuria or suprapubic/flank pain.  Patient denies any fevers, chills, nausea or vomiting.    PVR 0 mL   BPH meds: finasteride  5 mg daily   Serum creatinine (10/2023) 1.03, eGFR >60  PMH: Past Medical History:  Diagnosis Date   Arrhythmia    Cough    CHRONIC   Depression    Disturbance of sleep    Diverticulitis    GERD (gastroesophageal reflux disease)    Hemorrhoids    Hypertension    OA (osteoarthritis)    Pure hypercholesterolemia    Sleep apnea    Stroke [facility])     Surgical History: Past Surgical History:  Procedure Laterality Date   CATARACT EXTRACTION W/PHACO Left 02/14/2016   Procedure: CATARACT EXTRACTION PHACO AND INTRAOCULAR LENS PLACEMENT (IOC);  Surgeon: Elsie Carmine, MD;  Location: [facility] ORS;  Service: Ophthalmology;  Laterality: Left;  US  53.7 AP% 21.5 CDE 11.58 FLUID PACK LOT # [id number]   CATARACT EXTRACTION W/PHACO Right 12/18/2018   Procedure: CATARACT EXTRACTION PHACO AND INTRAOCULAR LENS PLACEMENT (IOC)  RIGHT, VISION BLUE;  Surgeon: Ferol Rogue, MD;  Location: [facility] ORS;  Service: Ophthalmology;  Laterality: Right;  US   01:14 CDE 11.82 Fluid pack lot # [id number]   COLONOSCOPY     KNEE ARTHROSCOPY     TRANSURETHRAL RESECTION OF PROSTATE      Home Medications:  Allergies as of 03/03/2024    No Known Allergies      Medication List        Accurate as of March 03, 2024  4:45 PM. If you have any questions, ask your nurse or doctor.          acetaminophen  325 MG tablet Commonly known as: TYLENOL  Take 325-650 mg by mouth every 6 (six) hours as needed for moderate pain or headache.   atorvastatin  20 MG tablet Commonly known as: LIPITOR TAKE 1 TABLET BY MOUTH EVERYDAY AT BEDTIME   busPIRone  10 MG tablet Commonly known as: BUSPAR  TAKE 1 TABLET (10 MG TOTAL) BY MOUTH 2 (TWO) TIMES DAILY AS NEEDED (FOR ANXIETY).   diltiazem  60 MG tablet Commonly known as: CARDIZEM  TAKE 1 TABLET BY MOUTH EVERY DAY   finasteride  5 MG tablet Commonly known as: PROSCAR  TAKE 1 TABLET (5 MG TOTAL) BY MOUTH DAILY.   Fish Oil  1200 MG Caps Take 2 capsules (2,400 mg total) by mouth in the morning and at bedtime.   levocetirizine 5 MG tablet Commonly known as: XYZAL  TAKE 1 TABLET BY MOUTH EVERY DAY IN THE EVENING   meclizine  12.5 MG tablet Commonly known as: ANTIVERT  Take 1 tablet (12.5 mg total) by mouth daily as needed for dizziness (vertigo episodes). Use sparingly.  Must get appointment if using daily or symptoms worsen   melatonin 5 MG Tabs Take 5 mg by mouth at  bedtime.   ofloxacin 0.3 % ophthalmic solution Commonly known as: OCUFLOX INSTILL 1 DROP INTO LEFT EYE 4 TIMES A DAY FOR 7 DAYS   Prevagen 10 MG Caps Generic drug: Apoaequorin Take 10 capsules by mouth daily.   Superior Prostate Tabs Take 250 tablets by mouth daily.        Allergies: No Known Allergies  Family History: Family History  Problem Relation Age of Onset   Hypertension Son        Kurt of parents health.   Prostate cancer Neg Hx    Bladder Cancer Neg Hx    Kidney cancer Neg Hx     Social History:  reports that he has never smoked. He has never used smokeless tobacco. He reports that he does not drink alcohol and does not use drugs.  ROS: For pertinent review of systems please refer to  history of present illness  Physical Exam: BP 132/70   Pulse 64   Ht 6' 1 (1.854 m)   Wt 210 lb (95.3 kg)   BMI 27.71 kg/m   Constitutional:  Well nourished. Alert and oriented, No acute distress. HEENT: [place] AT, moist mucus membranes.  Trachea midline Cardiovascular: No clubbing, cyanosis, or edema. Respiratory: Normal respiratory effort, no increased work of breathing. Neurologic: Grossly intact, no focal deficits, moving all 4 extremities. Psychiatric: Normal mood and affect.   Laboratory Data: See Epic and HPI   I have reviewed the labs.  See HPI.   Pertinent imaging  01/02/24 15:50  Scan Result 0ml    Assessment & Plan:    1. BPH with LUTS - moderate symptoms and he is delighted - PVR < 300 cc  - encouraged avoiding bladder irritants, fluid restriction before bedtime and timed voiding's - finasteride  5 mg daily - educated on red flag symptoms: acute retention, gross hematuria, fever, severe pain - advised to call clinic or go to the ED if these occur   Return in about 1 year (around 03/03/2025) for IPSS and PVR.  These notes generated with voice recognition software. I apologize for typographical errors.  CLOTILDA HELON RIGGERS  [facility] [address]  [address] [place], [place] [address] [phone]

## 2024-03-03 ENCOUNTER — Encounter: Payer: Self-pay | Admitting: Urology

## 2024-03-03 ENCOUNTER — Ambulatory Visit: Admitting: Urology

## 2024-03-03 VITALS — BP 132/70 | HR 64 | Ht 73.0 in | Wt 210.0 lb

## 2024-03-03 DIAGNOSIS — N138 Other obstructive and reflux uropathy: Secondary | ICD-10-CM | POA: Diagnosis not present

## 2024-03-03 DIAGNOSIS — N401 Enlarged prostate with lower urinary tract symptoms: Secondary | ICD-10-CM

## 2024-03-03 NOTE — Telephone Encounter (Signed)
 Requested Prescriptions  Pending Prescriptions Disp Refills   diltiazem  (CARDIZEM ) 60 MG tablet [Pharmacy Med Name: DILTIAZEM  60 MG TABLET] 90 tablet 1    Sig: TAKE 1 TABLET BY MOUTH EVERY DAY     Cardiovascular: Calcium  Channel Blockers 3 Passed - 03/03/2024 11:27 AM      Passed - ALT in normal range and within 360 days    ALT  Date Value Ref Range Status  08/15/2023 14 0 - 44 U/L Final         Passed - AST in normal range and within 360 days    AST  Date Value Ref Range Status  08/15/2023 19 15 - 41 U/L Final         Passed - Cr in normal range and within 360 days    Creat  Date Value Ref Range Status  04/11/2023 1.23 (H) 0.70 - 1.22 mg/dL Final   Creatinine, Ser  Date Value Ref Range Status  10/07/2023 1.03 0.61 - 1.24 mg/dL Final         Passed - Last BP in normal range    BP Readings from Last 1 Encounters:  01/10/24 120/78         Passed - Last Heart Rate in normal range    Pulse Readings from Last 1 Encounters:  01/10/24 64         Passed - Valid encounter within last 6 months    Recent Outpatient Visits           1 month ago Dizziness   [facility] Gareth Mliss FALCON, [name]   4 months ago Essential hypertension   [facility] Gareth Mliss FALCON, [name]   6 months ago Dizziness   [place] Gareth Mliss FALCON, [name]       Future Appointments             Today [name], Clotilda DELENA RIGGERS [facility] [place]   In 1 month Gareth, Mliss FALCON, [name] [facility] [place]

## 2024-03-16 ENCOUNTER — Other Ambulatory Visit: Payer: Self-pay | Admitting: Nurse Practitioner

## 2024-03-16 DIAGNOSIS — R42 Dizziness and giddiness: Secondary | ICD-10-CM

## 2024-03-16 NOTE — Telephone Encounter (Unsigned)
 Copied from CRM #[id number]. Topic: Clinical - Medication Refill >> Mar 16, 2024 10:04 AM Nathanel BROCKS wrote: Medication: meclizine  (ANTIVERT ) 12.5 MG tablet  Has the patient contacted their pharmacy? No  This is the patient's preferred pharmacy:  [facility] [address], [place] - [address] [place] [address] Phone: [phone] Fax: [phone]  Is this the correct pharmacy for this prescription? Yes If no, delete pharmacy and type the correct one.   Has the prescription been filled recently? Yes  Is the patient out of the medication? Yes  Has the patient been seen for an appointment in the last year OR does the patient have an upcoming appointment? Yes  Can we respond through MyChart? No  Agent: Please be advised that Rx refills may take up to 3 business days. We ask that you follow-up with your pharmacy.

## 2024-03-18 MED ORDER — MECLIZINE HCL 12.5 MG PO TABS: 12.5000 mg | ORAL_TABLET | Freq: Every day | ORAL | 0 refills | Status: AC | PRN

## 2024-03-18 NOTE — Telephone Encounter (Signed)
 Requested medication (s) are due for refill today: yes  Requested medication (s) are on the active medication list: yes  Last refill:  01/10/24  Future visit scheduled: yes  Notes to clinic:  Unable to refill per protocol, cannot delegate.      Requested Prescriptions  Pending Prescriptions Disp Refills   meclizine  (ANTIVERT ) 12.5 MG tablet 90 tablet 0    Sig: Take 1 tablet (12.5 mg total) by mouth daily as needed for dizziness (vertigo episodes). Use sparingly.  Must get appointment if using daily or symptoms worsen     Not Delegated - Gastroenterology: Antiemetics Failed - 03/18/2024  3:34 PM      Failed - This refill cannot be delegated      Passed - Valid encounter within last 6 months    Recent Outpatient Visits           2 months ago Dizziness   [facility] Gareth Mliss FALCON, [name]   5 months ago Essential hypertension   [facility] Gareth Mliss FALCON, [name]   7 months ago Dizziness   [facility] Gareth Mliss FALCON, [name]       Future Appointments             In 3 weeks Gareth, Mliss FALCON, [name] [facility] [place]   In 11 months Helon, Clotilda DELENA RIGGERS [facility]

## 2024-03-22 ENCOUNTER — Other Ambulatory Visit: Payer: Self-pay | Admitting: Nurse Practitioner

## 2024-03-22 DIAGNOSIS — J329 Chronic sinusitis, unspecified: Secondary | ICD-10-CM

## 2024-03-22 DIAGNOSIS — E782 Mixed hyperlipidemia: Secondary | ICD-10-CM

## 2024-03-22 DIAGNOSIS — F419 Anxiety disorder, unspecified: Secondary | ICD-10-CM

## 2024-03-25 NOTE — Telephone Encounter (Signed)
 Requested by interface surescripts. Future visit 04/10/24. Requested Prescriptions  Pending Prescriptions Disp Refills   atorvastatin  (LIPITOR) 20 MG tablet [Pharmacy Med Name: ATORVASTATIN  20 MG TABLET] 90 tablet 0    Sig: TAKE 1 TABLET BY MOUTH EVERYDAY AT BEDTIME     Cardiovascular:  Antilipid - Statins Failed - 03/25/2024 10:40 AM      Failed - Lipid Panel in normal range within the last 12 months    Cholesterol, Total  Date Value Ref Range Status  09/22/2014 134 100 - 199 mg/dL Final   Cholesterol  Date Value Ref Range Status  04/11/2023 115 <200 mg/dL Final   LDL Cholesterol (Calc)  Date Value Ref Range Status  04/11/2023 52 mg/dL (calc) Final    Comment:    Reference range: <100 . Desirable range <100 mg/dL for primary prevention;   <70 mg/dL for patients with CHD or diabetic patients  with > or = 2 CHD risk factors. SABRA LDL-C is now calculated using the Penafiel-Hopkins  calculation, which is a validated novel method providing  better accuracy than the Friedewald equation in the  estimation of LDL-C.  Gladis APPLETHWAITE et al. SANDREA. 7986;689(80): 2061-2068  (http://education.[facility].com/faq/FAQ164)    HDL  Date Value Ref Range Status  04/11/2023 50 > OR = 40 mg/dL Final  93/77/7983 46 >60 mg/dL Final    Comment:    According to ATP-III Guidelines, HDL-C >59 mg/dL is considered a negative risk factor for CHD.    Triglycerides  Date Value Ref Range Status  04/11/2023 52 <150 mg/dL Final         Passed - Patient is not pregnant      Passed - Valid encounter within last 12 months    Recent Outpatient Visits           2 months ago Dizziness   [facility] Gareth Mliss FALCON, [name]   5 months ago Essential hypertension   [facility] Gareth Mliss FALCON, [name]   7 months ago Dizziness   [facility] Gareth Mliss FALCON, [name]       Future Appointments             In 2 weeks Gareth Mliss FALCON,  [name] [facility] [place]   In 11 months [name], Clotilda DELENA RIGGERS [facility] [place]             levocetirizine (XYZAL ) 5 MG tablet [Pharmacy Med Name: LEVOCETIRIZINE 5 MG TABLET] 90 tablet 0    Sig: TAKE 1 TABLET BY MOUTH EVERY DAY IN THE EVENING     Ear, Nose, and Throat:  Antihistamines - levocetirizine dihydrochloride  Passed - 03/25/2024 10:40 AM      Passed - Cr in normal range and within 360 days    Creat  Date Value Ref Range Status  04/11/2023 1.23 (H) 0.70 - 1.22 mg/dL Final   Creatinine, Ser  Date Value Ref Range Status  10/07/2023 1.03 0.61 - 1.24 mg/dL Final         Passed - eGFR is 10 or above and within 360 days    GFR, Est African American  Date Value Ref Range Status  11/12/2019 63 > OR = 60 mL/min/1.35m2 Final   GFR, Est Non African American  Date Value Ref Range Status  11/12/2019 54 (L) > OR = 60 mL/min/1.48m2 Final   GFR, Estimated  Date Value Ref Range Status  10/07/2023 >60 >60 mL/min Final  Comment:    (NOTE) Calculated using the CKD-EPI Creatinine Equation (2021)    eGFR  Date Value Ref Range Status  04/11/2023 55 (L) > OR = 60 mL/min/1.62m2 Final         Passed - Valid encounter within last 12 months    Recent Outpatient Visits           2 months ago Dizziness   [facility] Gareth Mliss FALCON, [name]   5 months ago Essential hypertension   [facility] Gareth Mliss FALCON, [name]   7 months ago Dizziness   [facility] Gareth Mliss FALCON, [name]       Future Appointments             In 2 weeks Gareth Mliss FALCON, [name] [facility] [place]   In 11 months Helon, Clotilda DELENA RIGGERS [facility] [place]             busPIRone  (BUSPAR ) 10 MG tablet [Pharmacy Med Name: BUSPIRONE  HCL 10 MG TABLET] 180 tablet 0    Sig: TAKE 1 TABLET (10 MG TOTAL) BY MOUTH 2 (TWO) TIMES DAILY AS NEEDED (FOR  ANXIETY).     Psychiatry: Anxiolytics/Hypnotics - Non-controlled Passed - 03/25/2024 10:40 AM      Passed - Valid encounter within last 12 months    Recent Outpatient Visits           2 months ago Dizziness   [facility] Gareth Mliss FALCON, [name]   5 months ago Essential hypertension   [facility] Gareth Mliss FALCON, [name]   7 months ago Dizziness   [facility] Gareth Mliss FALCON, [name]       Future Appointments             In 2 weeks Gareth, Mliss FALCON, [name] [facility] [place]   In 11 months Helon, Clotilda DELENA RIGGERS [facility]

## 2024-04-10 ENCOUNTER — Ambulatory Visit: Admitting: Nurse Practitioner

## 2024-04-10 ENCOUNTER — Encounter: Payer: Self-pay | Admitting: Nurse Practitioner

## 2024-04-10 VITALS — BP 130/80 | HR 70 | Temp 98.3°F | Ht 73.0 in | Wt 191.0 lb

## 2024-04-10 DIAGNOSIS — E782 Mixed hyperlipidemia: Secondary | ICD-10-CM

## 2024-04-10 DIAGNOSIS — H8110 Benign paroxysmal vertigo, unspecified ear: Secondary | ICD-10-CM

## 2024-04-10 DIAGNOSIS — K219 Gastro-esophageal reflux disease without esophagitis: Secondary | ICD-10-CM | POA: Diagnosis not present

## 2024-04-10 DIAGNOSIS — F330 Major depressive disorder, recurrent, mild: Secondary | ICD-10-CM

## 2024-04-10 DIAGNOSIS — E66811 Obesity, class 1: Secondary | ICD-10-CM

## 2024-04-10 DIAGNOSIS — N183 Chronic kidney disease, stage 3 unspecified: Secondary | ICD-10-CM | POA: Diagnosis not present

## 2024-04-10 DIAGNOSIS — K5900 Constipation, unspecified: Secondary | ICD-10-CM

## 2024-04-10 DIAGNOSIS — Z8673 Personal history of transient ischemic attack (TIA), and cerebral infarction without residual deficits: Secondary | ICD-10-CM

## 2024-04-10 DIAGNOSIS — G4733 Obstructive sleep apnea (adult) (pediatric): Secondary | ICD-10-CM | POA: Diagnosis not present

## 2024-04-10 DIAGNOSIS — F419 Anxiety disorder, unspecified: Secondary | ICD-10-CM

## 2024-04-10 DIAGNOSIS — G8194 Hemiplegia, unspecified affecting left nondominant side: Secondary | ICD-10-CM | POA: Diagnosis not present

## 2024-04-10 DIAGNOSIS — M159 Polyosteoarthritis, unspecified: Secondary | ICD-10-CM

## 2024-04-10 DIAGNOSIS — R269 Unspecified abnormalities of gait and mobility: Secondary | ICD-10-CM

## 2024-04-10 DIAGNOSIS — N401 Enlarged prostate with lower urinary tract symptoms: Secondary | ICD-10-CM | POA: Diagnosis not present

## 2024-04-10 DIAGNOSIS — I10 Essential (primary) hypertension: Secondary | ICD-10-CM | POA: Diagnosis not present

## 2024-04-10 DIAGNOSIS — Z23 Encounter for immunization: Secondary | ICD-10-CM

## 2024-04-10 DIAGNOSIS — J301 Allergic rhinitis due to pollen: Secondary | ICD-10-CM

## 2024-04-10 DIAGNOSIS — R7303 Prediabetes: Secondary | ICD-10-CM

## 2024-04-10 DIAGNOSIS — Z6830 Body mass index (BMI) 30.0-30.9, adult: Secondary | ICD-10-CM

## 2024-04-10 MED ORDER — SENNA-DOCUSATE SODIUM 8.6-50 MG PO TABS: 1.0000 | ORAL_TABLET | Freq: Every day | ORAL | 0 refills | Status: AC

## 2024-04-10 NOTE — Progress Notes (Signed)
 "  BP 130/80   Pulse 70   Temp 98.3 F (36.8 C)   Ht 6' 1 (1.854 m)   Wt 191 lb (86.6 kg)   SpO2 98%   BMI 25.20 kg/m    Subjective:    Patient ID: Jeffery Campbell, male    DOB: 1928-08-08, 89 y.o.   MRN: [id number]  HPI: Jeffery Campbell is a 89 y.o. male  Chief Complaint  Patient presents with   Medical Management of Chronic Issues    Pt c/o issue with bowel movements. States home health nurse has not been out to the house.    Discussed the use of AI scribe software for clinical note transcription with the patient, who gave verbal consent to proceed.  History of Present Illness Jeffery Campbell is a 89 year old male who presents for a six month follow-up.  Constipation - Bowel movements described as sometimes hard like 'rabbit poop'. - No significant bowel movement recently, only 'just a little' this morning -denies any abdominal pain  Hypertension - Managed with diltiazem  60 mg daily.  Hyperlipidemia - Managed with atorvastatin  20 mg daily. - Last lipid panel in January 2025 was normal.  Dizziness and vertigo - Managed with meclizine  12.5 mg as needed. -has not needed it in a while   Anxiety - Managed with buspirone  10 mg twice a day as needed.  Benign prostatic hyperplasia - Managed with finasteride  5 mg daily.  Allergic rhinitis - Managed with Xyzal  5 mg daily.  Renal function and laboratory findings - Last BMP in July showed GFR of 60. - Blood counts were normal. - A1c was 5.2. - B12 levels were on the low end. - History of prediabetes.  Home health services - Home health nurse has not visited since November 11th, despite plan for evaluations and treatments two to three times per week for four to six weeks.         04/10/2024    9:11 AM 08/21/2023    3:21 PM 05/09/2023    8:53 AM  Depression screen PHQ 2/9  Decreased Interest 0 0 0  Down, Depressed, Hopeless 0 0 0  PHQ - 2 Score 0 0 0  Altered sleeping 0 0 0  Tired, decreased energy 0 0 0  Change in  appetite 0 0 0  Feeling bad or failure about yourself  0 0 0  Trouble concentrating 0 0 0  Moving slowly or fidgety/restless 0 0 0  Suicidal thoughts 0 0 0  PHQ-9 Score 0 0  0   Difficult doing work/chores  Not difficult at all Not difficult at all     Data saved with a previous flowsheet row definition    Relevant past medical, surgical, family and social history reviewed and updated as indicated. Interim medical history since our last visit reviewed. Allergies and medications reviewed and updated.  Review of Systems  Ten systems reviewed and is negative except as mentioned in HPI      Objective:      BP 130/80   Pulse 70   Temp 98.3 F (36.8 C)   Ht 6' 1 (1.854 m)   Wt 191 lb (86.6 kg)   SpO2 98%   BMI 25.20 kg/m    Wt Readings from Last 3 Encounters:  04/10/24 191 lb (86.6 kg)  03/03/24 210 lb (95.3 kg)  01/10/24 210 lb (95.3 kg)    Physical Exam VITALS: BP- 130/92 GENERAL: Alert, cooperative, well developed, no acute distress. HEENT: Normocephalic, normal oropharynx,  moist mucous membranes. CHEST: Clear to auscultation bilaterally, no wheezes, rhonchi, or crackles. CARDIOVASCULAR: Normal heart rate and rhythm, S1 and S2 normal without murmurs. ABDOMEN: Soft, non-tender, non-distended, without organomegaly, normal bowel sounds. EXTREMITIES: No cyanosis or edema. NEUROLOGICAL: Cranial nerves grossly intact, moves all extremities without gross motor or sensory deficit.  Results for orders placed or performed in visit on 01/02/24  BLADDER SCAN AMB NON-IMAGING   Collection Time: 01/02/24  3:50 PM  Result Value Ref Range   Scan Result 0ml           Assessment & Plan:   Problem List Items Addressed This Visit       Cardiovascular and Mediastinum   Essential hypertension - Primary (Chronic)   Relevant Orders   CBC with Differential/Platelet   Comprehensive metabolic panel with GFR     Respiratory   Allergic rhinitis due to pollen   Obstructive sleep  apnea syndrome     Digestive   Gastro-esophageal reflux disease without esophagitis   Relevant Medications   sennosides-docusate sodium  (SENOKOT-S) 8.6-50 MG tablet     Nervous and Auditory   Left hemiparesis (HCC) (Chronic)   Relevant Orders   Ambulatory referral to Home Health   Benign paroxysmal positional nystagmus     Musculoskeletal and Integument   Osteoarthrosis involving more than one site but not generalized     Genitourinary   CKD (chronic kidney disease) stage 3, GFR 30-59 ml/min (HCC)   Relevant Orders   Comprehensive metabolic panel with GFR   BPH (benign prostatic hyperplasia)     Other   HLD (hyperlipidemia) (Chronic)   Relevant Orders   Lipid panel   Class 1 obesity due to excess calories with serious comorbidity and body mass index (BMI) of 30.0 to 30.9 in adult   Abnormal gait   Relevant Orders   Ambulatory referral to Home Health   Prediabetes   Relevant Orders   Hemoglobin A1c   History of CVA (cerebrovascular accident)   Relevant Orders   Ambulatory referral to Home Health   Anxiety with depression   Mild episode of recurrent major depressive disorder   Other Visit Diagnoses       Immunization due       Relevant Orders   Flu vaccine HIGH DOSE PF(Fluzone Trivalent)     Constipation, unspecified constipation type       Relevant Medications   sennosides-docusate sodium  (SENOKOT-S) 8.6-50 MG tablet        Assessment and Plan Assessment & Plan Constipation Reports difficulty with bowel movements, sometimes resulting in hard stools. Abdomen is soft and non-tender. - Recommended increased dietary fiber intake, including green leafy vegetables and prunes. -push fluids - Prescribed Senna and docusate sodium  to aid bowel movements.  Essential hypertension Blood pressure slightly elevated at 130/92 mmHg. - Rechecked blood pressure.  Chronic kidney disease, stage 3 GFR was 60 in July, indicating well-managed kidney function.  Benign  paroxysmal positional vertigo Dizziness is well-controlled with meclizine  as needed.  Abnormal gait and left hemiparesis status post CVA Abnormal gait and left hemiparesis persist post-CVA. Home health services have not been active recently. - Placed new referral for home health services to evaluate and treat two to three times per week for four to six weeks.  Mixed hyperlipidemia Lipid panel in January 2025 was within normal range.  Anxiety Managed with buspirone  as needed.  Prediabetes A1c was 5.2, indicating good control of prediabetes. - Ordered A1c test.        Follow  up plan: Return in about 6 months (around 10/08/2024) for follow up. "

## 2024-04-11 LAB — HEMOGLOBIN A1C
Hgb A1c MFr Bld: 4.9 %
Mean Plasma Glucose: 94 mg/dL
eAG (mmol/L): 5.2 mmol/L

## 2024-04-11 LAB — CBC WITH DIFFERENTIAL/PLATELET
Absolute Lymphocytes: 1040 {cells}/uL (ref 850–3900)
Absolute Monocytes: 618 {cells}/uL (ref 200–950)
Basophils Absolute: 39 {cells}/uL (ref 0–200)
Basophils Relative: 0.6 %
Eosinophils Absolute: 52 {cells}/uL (ref 15–500)
Eosinophils Relative: 0.8 %
HCT: 50.3 % (ref 39.4–51.1)
Hemoglobin: 16.7 g/dL (ref 13.2–17.1)
MCH: 30.8 pg (ref 27.0–33.0)
MCHC: 33.2 g/dL (ref 31.6–35.4)
MCV: 92.8 fL (ref 81.4–101.7)
MPV: 10.6 fL (ref 7.5–12.5)
Monocytes Relative: 9.5 %
Neutro Abs: 4752 {cells}/uL (ref 1500–7800)
Neutrophils Relative %: 73.1 %
Platelets: 244 Thousand/uL (ref 140–400)
RBC: 5.42 Million/uL (ref 4.20–5.80)
RDW: 12.6 % (ref 11.0–15.0)
Total Lymphocyte: 16 %
WBC: 6.5 Thousand/uL (ref 3.8–10.8)

## 2024-04-11 LAB — LIPID PANEL
Cholesterol: 122 mg/dL
HDL: 52 mg/dL
LDL Cholesterol (Calc): 57 mg/dL
Non-HDL Cholesterol (Calc): 70 mg/dL
Total CHOL/HDL Ratio: 2.3 (calc)
Triglycerides: 53 mg/dL

## 2024-04-11 LAB — COMPREHENSIVE METABOLIC PANEL WITH GFR
AG Ratio: 1.7 (calc) (ref 1.0–2.5)
ALT: 8 U/L — ABNORMAL LOW (ref 9–46)
AST: 12 U/L (ref 10–35)
Albumin: 4.1 g/dL (ref 3.6–5.1)
Alkaline phosphatase (APISO): 51 U/L (ref 35–144)
BUN: 13 mg/dL (ref 7–25)
CO2: 27 mmol/L (ref 20–32)
Calcium: 9.3 mg/dL (ref 8.6–10.3)
Chloride: 104 mmol/L (ref 98–110)
Creat: 0.99 mg/dL (ref 0.70–1.22)
Globulin: 2.4 g/dL (ref 1.9–3.7)
Glucose, Bld: 118 mg/dL — ABNORMAL HIGH (ref 65–99)
Potassium: 4 mmol/L (ref 3.5–5.3)
Sodium: 141 mmol/L (ref 135–146)
Total Bilirubin: 1.5 mg/dL — ABNORMAL HIGH (ref 0.2–1.2)
Total Protein: 6.5 g/dL (ref 6.1–8.1)
eGFR: 71 mL/min/1.73m2

## 2024-04-13 ENCOUNTER — Ambulatory Visit: Payer: Self-pay | Admitting: Nurse Practitioner

## 2024-04-20 ENCOUNTER — Encounter: Payer: Self-pay | Admitting: Internal Medicine

## 2024-04-20 ENCOUNTER — Other Ambulatory Visit (HOSPITAL_COMMUNITY): Payer: Self-pay

## 2024-04-30 ENCOUNTER — Telehealth: Payer: Self-pay

## 2024-04-30 NOTE — Telephone Encounter (Signed)
 notified

## 2024-04-30 NOTE — Telephone Encounter (Signed)
 Copied from CRM #[id number]. Topic: Clinical - Medical Advice >> Apr 30, 2024  1:56 PM Sophia H wrote: Reason for CRM: Spoke with Lorenza GLENWOOD Dux [facility] nurse who is calling on behalf of the patient. States complaining of runny nose - low grade fever 99.4 - no headache, wheezing or coughing. Want to know if there is anything provider suggests patient taking as far as runny nose goes.    Please reach out & advise - # [phone] [facility].   [id number] - [place], [place] - [address]

## 2024-05-14 ENCOUNTER — Ambulatory Visit: Payer: PPO

## 2024-10-08 ENCOUNTER — Ambulatory Visit: Admitting: Nurse Practitioner

## 2025-03-03 ENCOUNTER — Ambulatory Visit: Admitting: Urology
